# Patient Record
Sex: Female | Born: 1935 | Race: White | Hispanic: No | Marital: Single | Smoking: Never smoker
Health system: Southern US, Community
[De-identification: ages and names within clinical notes are randomized; demographics above are authoritative.]

## PROBLEM LIST (undated history)

## (undated) DIAGNOSIS — I878 Other specified disorders of veins: Secondary | ICD-10-CM

## (undated) DIAGNOSIS — Z86711 Personal history of pulmonary embolism: Secondary | ICD-10-CM

## (undated) DIAGNOSIS — G20 Parkinson's disease: Secondary | ICD-10-CM

## (undated) DIAGNOSIS — G20A1 Parkinson's disease without dyskinesia, without mention of fluctuations: Secondary | ICD-10-CM

## (undated) DIAGNOSIS — I509 Heart failure, unspecified: Secondary | ICD-10-CM

---

## 1998-08-15 ENCOUNTER — Other Ambulatory Visit: Admission: RE | Admit: 1998-08-15 | Discharge: 1998-08-15 | Payer: Self-pay | Admitting: Gynecology

## 2000-12-01 ENCOUNTER — Encounter: Admission: RE | Admit: 2000-12-01 | Discharge: 2000-12-01 | Payer: Self-pay | Admitting: *Deleted

## 2000-12-01 ENCOUNTER — Encounter: Payer: Self-pay | Admitting: *Deleted

## 2000-12-02 ENCOUNTER — Encounter: Payer: Self-pay | Admitting: *Deleted

## 2000-12-02 ENCOUNTER — Encounter: Admission: RE | Admit: 2000-12-02 | Discharge: 2000-12-02 | Payer: Self-pay | Admitting: *Deleted

## 2003-09-11 ENCOUNTER — Emergency Department (HOSPITAL_COMMUNITY): Admission: EM | Admit: 2003-09-11 | Discharge: 2003-09-11 | Payer: Self-pay | Admitting: Emergency Medicine

## 2003-09-26 ENCOUNTER — Emergency Department (HOSPITAL_COMMUNITY): Admission: EM | Admit: 2003-09-26 | Discharge: 2003-09-27 | Payer: Self-pay | Admitting: Emergency Medicine

## 2003-09-27 ENCOUNTER — Ambulatory Visit (HOSPITAL_COMMUNITY): Admission: RE | Admit: 2003-09-27 | Discharge: 2003-09-27 | Payer: Self-pay | Admitting: Emergency Medicine

## 2003-10-02 ENCOUNTER — Emergency Department (HOSPITAL_COMMUNITY): Admission: EM | Admit: 2003-10-02 | Discharge: 2003-10-02 | Payer: Self-pay | Admitting: Emergency Medicine

## 2003-10-21 ENCOUNTER — Emergency Department (HOSPITAL_COMMUNITY): Admission: EM | Admit: 2003-10-21 | Discharge: 2003-10-21 | Payer: Self-pay | Admitting: Emergency Medicine

## 2004-06-29 ENCOUNTER — Emergency Department (HOSPITAL_COMMUNITY): Admission: EM | Admit: 2004-06-29 | Discharge: 2004-06-29 | Payer: Self-pay | Admitting: Emergency Medicine

## 2004-07-17 ENCOUNTER — Emergency Department (HOSPITAL_COMMUNITY): Admission: EM | Admit: 2004-07-17 | Discharge: 2004-07-17 | Payer: Self-pay | Admitting: Emergency Medicine

## 2004-07-28 ENCOUNTER — Encounter: Admission: RE | Admit: 2004-07-28 | Discharge: 2004-07-28 | Payer: Self-pay | Admitting: *Deleted

## 2004-11-18 ENCOUNTER — Emergency Department (HOSPITAL_COMMUNITY): Admission: EM | Admit: 2004-11-18 | Discharge: 2004-11-19 | Payer: Self-pay | Admitting: Emergency Medicine

## 2004-12-21 ENCOUNTER — Emergency Department (HOSPITAL_COMMUNITY): Admission: EM | Admit: 2004-12-21 | Discharge: 2004-12-21 | Payer: Self-pay | Admitting: Emergency Medicine

## 2005-01-16 ENCOUNTER — Emergency Department (HOSPITAL_COMMUNITY): Admission: EM | Admit: 2005-01-16 | Discharge: 2005-01-16 | Payer: Self-pay | Admitting: Emergency Medicine

## 2005-02-12 ENCOUNTER — Emergency Department (HOSPITAL_COMMUNITY): Admission: EM | Admit: 2005-02-12 | Discharge: 2005-02-12 | Payer: Self-pay | Admitting: *Deleted

## 2008-11-15 ENCOUNTER — Ambulatory Visit: Payer: Self-pay | Admitting: Internal Medicine

## 2008-11-15 DIAGNOSIS — Z86718 Personal history of other venous thrombosis and embolism: Secondary | ICD-10-CM | POA: Insufficient documentation

## 2008-11-15 DIAGNOSIS — I872 Venous insufficiency (chronic) (peripheral): Secondary | ICD-10-CM | POA: Insufficient documentation

## 2009-01-14 ENCOUNTER — Encounter: Admission: RE | Admit: 2009-01-14 | Discharge: 2009-01-23 | Payer: Self-pay | Admitting: *Deleted

## 2009-01-16 ENCOUNTER — Telehealth: Payer: Self-pay | Admitting: Internal Medicine

## 2009-01-17 ENCOUNTER — Encounter: Payer: Self-pay | Admitting: Internal Medicine

## 2009-01-20 ENCOUNTER — Observation Stay (HOSPITAL_COMMUNITY): Admission: EM | Admit: 2009-01-20 | Discharge: 2009-01-21 | Payer: Self-pay | Admitting: Emergency Medicine

## 2009-01-21 ENCOUNTER — Telehealth: Payer: Self-pay | Admitting: Internal Medicine

## 2009-01-23 ENCOUNTER — Telehealth (INDEPENDENT_AMBULATORY_CARE_PROVIDER_SITE_OTHER): Payer: Self-pay | Admitting: *Deleted

## 2009-01-30 ENCOUNTER — Telehealth (INDEPENDENT_AMBULATORY_CARE_PROVIDER_SITE_OTHER): Payer: Self-pay | Admitting: *Deleted

## 2009-02-08 ENCOUNTER — Ambulatory Visit: Payer: Self-pay | Admitting: Cardiology

## 2009-02-08 LAB — CONVERTED CEMR LAB: POC INR: 3.9

## 2009-02-15 ENCOUNTER — Ambulatory Visit: Payer: Self-pay | Admitting: Cardiovascular Disease

## 2009-02-22 ENCOUNTER — Ambulatory Visit: Payer: Self-pay | Admitting: Internal Medicine

## 2009-03-01 ENCOUNTER — Ambulatory Visit: Payer: Self-pay | Admitting: Internal Medicine

## 2009-03-01 LAB — CONVERTED CEMR LAB: POC INR: 2.7

## 2009-03-15 ENCOUNTER — Ambulatory Visit: Payer: Self-pay | Admitting: Internal Medicine

## 2009-04-11 ENCOUNTER — Telehealth: Payer: Self-pay | Admitting: Internal Medicine

## 2009-04-12 ENCOUNTER — Telehealth (INDEPENDENT_AMBULATORY_CARE_PROVIDER_SITE_OTHER): Payer: Self-pay | Admitting: *Deleted

## 2009-04-12 ENCOUNTER — Encounter: Payer: Self-pay | Admitting: Internal Medicine

## 2009-04-12 LAB — CONVERTED CEMR LAB: Prothrombin Time: 24.3 s — ABNORMAL HIGH (ref 11.6–15.2)

## 2009-04-16 ENCOUNTER — Telehealth: Payer: Self-pay | Admitting: Internal Medicine

## 2009-05-08 ENCOUNTER — Telehealth: Payer: Self-pay | Admitting: Internal Medicine

## 2009-05-08 ENCOUNTER — Encounter: Payer: Self-pay | Admitting: Cardiology

## 2009-05-09 ENCOUNTER — Encounter: Payer: Self-pay | Admitting: Cardiology

## 2009-05-10 ENCOUNTER — Encounter: Payer: Self-pay | Admitting: Cardiology

## 2009-05-10 LAB — CONVERTED CEMR LAB: POC INR: 2.32

## 2009-05-21 ENCOUNTER — Telehealth (INDEPENDENT_AMBULATORY_CARE_PROVIDER_SITE_OTHER): Payer: Self-pay | Admitting: *Deleted

## 2009-05-29 ENCOUNTER — Encounter: Payer: Self-pay | Admitting: Internal Medicine

## 2009-06-07 ENCOUNTER — Telehealth (INDEPENDENT_AMBULATORY_CARE_PROVIDER_SITE_OTHER): Payer: Self-pay | Admitting: *Deleted

## 2009-06-10 ENCOUNTER — Ambulatory Visit: Payer: Self-pay | Admitting: Cardiology

## 2009-06-10 ENCOUNTER — Telehealth (INDEPENDENT_AMBULATORY_CARE_PROVIDER_SITE_OTHER): Payer: Self-pay | Admitting: Cardiology

## 2009-06-10 LAB — CONVERTED CEMR LAB: POC INR: 3

## 2009-07-08 ENCOUNTER — Ambulatory Visit: Payer: Self-pay | Admitting: Cardiology

## 2009-07-08 LAB — CONVERTED CEMR LAB: POC INR: 2.7

## 2009-07-22 ENCOUNTER — Telehealth: Payer: Self-pay | Admitting: Internal Medicine

## 2009-08-02 ENCOUNTER — Ambulatory Visit: Payer: Self-pay | Admitting: Internal Medicine

## 2009-08-05 ENCOUNTER — Encounter (INDEPENDENT_AMBULATORY_CARE_PROVIDER_SITE_OTHER): Payer: Self-pay | Admitting: Cardiology

## 2009-08-05 ENCOUNTER — Ambulatory Visit: Payer: Self-pay | Admitting: Cardiology

## 2009-08-05 LAB — CONVERTED CEMR LAB: POC INR: 2.1

## 2009-08-12 ENCOUNTER — Ambulatory Visit: Payer: Self-pay | Admitting: Diagnostic Radiology

## 2009-08-12 ENCOUNTER — Emergency Department (HOSPITAL_BASED_OUTPATIENT_CLINIC_OR_DEPARTMENT_OTHER): Admission: EM | Admit: 2009-08-12 | Discharge: 2009-08-12 | Payer: Self-pay | Admitting: Emergency Medicine

## 2009-08-14 ENCOUNTER — Telehealth (INDEPENDENT_AMBULATORY_CARE_PROVIDER_SITE_OTHER): Payer: Self-pay | Admitting: *Deleted

## 2009-08-16 DIAGNOSIS — R609 Edema, unspecified: Secondary | ICD-10-CM

## 2009-08-16 DIAGNOSIS — I5032 Chronic diastolic (congestive) heart failure: Secondary | ICD-10-CM

## 2009-08-16 DIAGNOSIS — R531 Weakness: Secondary | ICD-10-CM | POA: Insufficient documentation

## 2009-08-16 HISTORY — DX: Chronic diastolic (congestive) heart failure: I50.32

## 2009-09-16 ENCOUNTER — Ambulatory Visit: Payer: Self-pay | Admitting: Cardiovascular Disease

## 2009-09-16 LAB — CONVERTED CEMR LAB: POC INR: 2.1

## 2009-10-14 ENCOUNTER — Ambulatory Visit: Payer: Self-pay | Admitting: Cardiovascular Disease

## 2009-11-15 ENCOUNTER — Ambulatory Visit: Payer: Self-pay | Admitting: Cardiology

## 2009-11-15 LAB — CONVERTED CEMR LAB: POC INR: 1.5

## 2009-11-20 ENCOUNTER — Ambulatory Visit: Payer: Self-pay | Admitting: Cardiology

## 2009-11-20 LAB — CONVERTED CEMR LAB: POC INR: 2.3

## 2009-12-05 ENCOUNTER — Ambulatory Visit: Payer: Self-pay | Admitting: Cardiology

## 2009-12-26 ENCOUNTER — Ambulatory Visit: Payer: Self-pay | Admitting: Internal Medicine

## 2009-12-26 LAB — CONVERTED CEMR LAB: POC INR: 1.8

## 2010-01-10 ENCOUNTER — Ambulatory Visit: Payer: Self-pay | Admitting: Cardiology

## 2010-01-10 LAB — CONVERTED CEMR LAB: POC INR: 1.5

## 2010-01-21 ENCOUNTER — Ambulatory Visit: Payer: Self-pay | Admitting: Cardiology

## 2010-01-21 LAB — CONVERTED CEMR LAB: POC INR: 1.9

## 2010-02-04 ENCOUNTER — Ambulatory Visit: Payer: Self-pay | Admitting: Cardiology

## 2010-02-27 ENCOUNTER — Ambulatory Visit: Payer: Self-pay | Admitting: Internal Medicine

## 2010-03-28 ENCOUNTER — Ambulatory Visit: Payer: Self-pay | Admitting: Cardiology

## 2010-04-17 ENCOUNTER — Ambulatory Visit: Payer: Self-pay | Admitting: Internal Medicine

## 2010-05-08 ENCOUNTER — Ambulatory Visit: Payer: Self-pay | Admitting: Cardiovascular Disease

## 2010-05-22 ENCOUNTER — Ambulatory Visit: Payer: Self-pay | Admitting: Cardiovascular Disease

## 2010-06-05 ENCOUNTER — Ambulatory Visit: Payer: Self-pay | Admitting: Internal Medicine

## 2010-06-05 LAB — CONVERTED CEMR LAB: POC INR: 2.5

## 2010-07-01 ENCOUNTER — Ambulatory Visit: Admission: RE | Admit: 2010-07-01 | Discharge: 2010-07-01 | Payer: Self-pay | Source: Home / Self Care

## 2010-07-01 LAB — CONVERTED CEMR LAB: POC INR: 2.5

## 2010-07-03 ENCOUNTER — Ambulatory Visit
Admission: RE | Admit: 2010-07-03 | Discharge: 2010-07-03 | Payer: Self-pay | Source: Home / Self Care | Attending: Internal Medicine | Admitting: Internal Medicine

## 2010-07-13 ENCOUNTER — Encounter: Payer: Self-pay | Admitting: *Deleted

## 2010-07-22 NOTE — Assessment & Plan Note (Signed)
Summary: FOLLOW UP/KLW   Primary Provider/Referring Provider:  None  CC:  follow up visit-discuss leg pain and breathing. Marland Kitchen  History of Present Illness:  03/01/09- Hx Pulmonary embolism/ coumadin, PVD,  She asks advice about diet an coumadin, saying advice from [facility] and [facility] varied about eating greens. She went to ER for leg edema. Can't wear elastic hose- can't get them on. Legs are comfortable despite swelling and redness. Denies chest pain, palpitation, cough, wheeze, phlegm. No problems but she asks screening mamogram- none in long time. Discussed need to establish primary care physician.  August 02, 2009- Pulmonary embolism/ coumadin, PVD,  She asked to speak, and began a long narrative of her health hx in recent years. She reminds me tentative dx Parkinson's made in past. She expressed concern that her mental status had been questioned in past. She asks to reschedule mamogram. She tells me that she has managed to upset various medical providers, but isn't specific about what has happened. Was living in a [employer], without her own transportation..She has asked to get her coags drawn wherever she could get to, when she couldn't reach the [facility].      Current Medications (verified): 1)  Coumadin 2.5 Mg Tabs (Warfarin Sodium) .... Take As Directed By Coumadin Clinic. 2)  Acetaminophen 500 Mg Tabs (Acetaminophen) .... As Needed  Allergies (verified): 1)  ! Vioxx 2)  ! * Ivp Dye 3)  ! Vancomycin  Past History:  Past Surgical History: Last updated: 11/15/2008 None reported  Family History: Last updated: 08/02/2009 Question of clotting disorder Father- died prostate cancer, surgical complication Brother- died prostate cancer Mother- alive  Social History: Last updated: 11/15/2008 Patient never smoked.  No alcohol Unmarried No children retired Occupational psychologist Lives alone or with friends  Risk Factors: Smoking Status: never  (11/15/2008)  Past Medical History: Pulmonary embolism 2006- [facility]  Family History: Question of clotting disorder Father- died prostate cancer, surgical complication Brother- died prostate cancer Mother- alive  Review of Systems      See HPI       The patient complains of peripheral edema.  The patient denies anorexia, fever, weight loss, weight gain, vision loss, decreased hearing, hoarseness, chest pain, syncope, dyspnea on exertion, prolonged cough, headaches, hemoptysis, and severe indigestion/heartburn.         Variable leg edema, sometimes sevcere, without leg cramps.  Vital Signs:  Patient profile:   76 year old female Height:      63.5 inches O2 Sat:      97 % on Room air Pulse rate:   74 / minute BP sitting:   116 / 70  (left arm) Cuff size:   regular  Vitals Entered By: Reynaldo Minium CMA (August 02, 2009 2:21 PM)  O2 Flow:  Room air  Physical Exam  Additional Exam:  General: A/Ox3; pleasant and cooperative, NAD, weak elderly woman, oriented and responsive,  SKIN: no rash, lesions NODES: no lymphadenopathy HEENT: [place]/AT, EOM- WNL, Conjuctivae- clear, PERRLA, TM-WNL, Nose- clear, Throat- clear and wnl, broken cap upper front NECK: Supple w/ fair ROM, JVD- none, normal carotid impulses w/o bruits Thyroid- . Neck is kept flexed. CHEST: few minor crackles, unlabored. No discrete mass, retraction, discharge or asymmetry. HEART: RRR, no m/g/r heard ABDOMEN: Soft and nl; nml bowel sounds; no organomegaly or masses noted ZOX:WRUE, nl pulses,3+ bilateral edema to knees, shiney with stasis changes mild erythema, neg Homan's  NEURO: Grossly intact to observation- no tremor  Impression & Recommendations:  Problem # 1:  PULMONARY EMBOLISM, HX OF (ICD-V12.51) She needs a primary care source and I suggested [facility]. She continues to follow at [facility] clinic. There is no sign of recurrent embolism, but with her leg edema, this will remain a concern.  Respiratory symptoms are minimal. Her updated medication list for this problem includes:    Coumadin 2.5 Mg Tabs (Warfarin sodium) .Marland Kitchen... Take as directed by coumadin clinic.  Problem # 2:  UNSPECIFIED VENOUS INSUFFICIENCY (ICD-459.81) Chronic stasis changes with chronic peripheral edema. . We discussed elevation of legs to reduce pressure. She finds it too difficult to put on elastic hose. She tried diuretics in past but has had difficulty with long term follow-up.  Other Orders: Est. Patient Level II ([address]) Primary Care Referral (Primary) Radiology Referral (Radiology)  Patient Instructions: 1)  Please schedule a follow-up appointment as needed. 2)  See [facility] to make contact with [facility] for primary care, and to set up a mamogram.

## 2010-07-22 NOTE — Medication Information (Signed)
Summary: rov  Anticoagulant Therapy  Managed by: Sherri Rad, RN, BSN Referring MD: Jetty Duhamel MD PCP: None Supervising MD: Ladona Ridgel MD, Sharlot Gowda Indication 1: Pulmonary Embolism Indication 2: DVT [place] Site: [address] INR POC 1.5 INR RANGE 2-3  Dietary changes: no    Health status changes: no    Bleeding/hemorrhagic complications: no    Recent/future hospitalizations: no    Any changes in medication regimen? no    Recent/future dental: yes     Details: possible tooth pull on monday  Any missed doses?: no       Is patient compliant with meds? yes       Allergies: 1)  ! Vioxx 2)  ! * Ivp Dye 3)  ! Vancomycin  Anticoagulation Management History:      Positive risk factors for bleeding include an age of 74 years or older.  The bleeding index is 'intermediate risk'.  Negative CHADS2 values include Age > 23 years old.  Her last INR was 2.2.  Anticoagulation responsible provider: Ladona Ridgel MD, Sharlot Gowda.  INR POC: 1.5.  Exp: 01/2011.    Anticoagulation Management Assessment/Plan:      The patient's current anticoagulation dose is Coumadin 2.5 mg tabs: Take as directed by coumadin clinic..  The target INR is 2.0-3.0.  The next INR is due 01/21/2010.  Anticoagulation instructions were given to patient.  Results were reviewed/authorized by Sherri Rad, RN, BSN.  She was notified by Sherri Rad, RN, BSN.         Prior Anticoagulation Instructions: INR 1.8  Take 1 1/2 tablets today then resume same dose of 1 tablet every day except 1 1/2 tablets on Monday, Wednesday and Friday.   Current Anticoagulation Instructions: INR 1.5  Continue current dose of coumadin until after your tooth is pulled. On monday take 2 tablets, then start 1 1/2 tablets every day except 1 tablet on Sunday, Tuesday, and Thursday.

## 2010-07-22 NOTE — Letter (Signed)
Summary: Handout Printed  Printed Handout:  - Coumadin Instructions-w/out Meds 

## 2010-07-22 NOTE — Medication Information (Signed)
Summary: ccr/hm  Anticoagulant Therapy  Managed by: Weston Brass, PharmD Referring MD: Jetty Duhamel MD PCP: None Supervising MD: Juanda Chance MD, Bruce Indication 1: Pulmonary Embolism Indication 2: DVT [place] Site: [address] INR POC 1.9 INR RANGE 2-3  Dietary changes: no    Health status changes: no    Bleeding/hemorrhagic complications: no    Recent/future hospitalizations: no    Any changes in medication regimen? no    Recent/future dental: no  Any missed doses?: no       Is patient compliant with meds? yes       Allergies: 1)  ! Vioxx 2)  ! * Ivp Dye 3)  ! Vancomycin  Anticoagulation Management History:      The patient is taking warfarin and comes in today for a routine follow up visit.  Positive risk factors for bleeding include an age of 74 years or older.  The bleeding index is 'intermediate risk'.  Negative CHADS2 values include Age > 54 years old.  Her last INR was 2.2.  Anticoagulation responsible provider: Juanda Chance MD, Smitty Cords.  INR POC: 1.9.  Exp: 01/2011.    Anticoagulation Management Assessment/Plan:      The patient's current anticoagulation dose is Coumadin 2.5 mg tabs: Take as directed by coumadin clinic..  The target INR is 2.0-3.0.  The next INR is due 02/04/2010.  Anticoagulation instructions were given to patient.  Results were reviewed/authorized by Weston Brass, PharmD.  She was notified by Weston Brass PharmD.         Prior Anticoagulation Instructions: INR 1.5  Continue current dose of coumadin until after your tooth is pulled. On monday take 2 tablets, then start 1 1/2 tablets every day except 1 tablet on Sunday, Tuesday, and Thursday.  Current Anticoagulation Instructions: INR 1.9  Take 1 1/2 tablets today then resume same dose of 1 1/2 tablets every day except 1 tablet on Sunday, Tuesday and Thursday.

## 2010-07-22 NOTE — Medication Information (Signed)
Summary: rov/mw  Anticoagulant Therapy  Managed by: Weston Brass, PharmD Referring MD: Jetty Duhamel MD PCP: None Supervising MD: Juanda Chance MD, Bruce Indication 1: Pulmonary Embolism Indication 2: DVT [place] Site: [address] INR POC 3.1 INR RANGE 2-3  Dietary changes: no    Health status changes: no    Bleeding/hemorrhagic complications: yes       Details: A few nose bleeds, but these stopped right away.    Recent/future hospitalizations: no     Recent/future dental: yes     Details: Appt in late October for a cap  Any missed doses?: no       Is patient compliant with meds? yes       Allergies: 1)  ! Vioxx 2)  ! * Ivp Dye 3)  ! Vancomycin  Anticoagulation Management History:      The patient is taking warfarin and comes in today for a routine follow up visit.  Positive risk factors for bleeding include an age of 74 years or older.  The bleeding index is 'intermediate risk'.  Negative CHADS2 values include Age > 45 years old.  Her last INR was 2.2.  Anticoagulation responsible Elna Radovich: Juanda Chance MD, Smitty Cords.  INR POC: 3.1.  Cuvette Lot#: [id number].  Exp: 04/2011.    Anticoagulation Management Assessment/Plan:      The patient's current anticoagulation dose is Coumadin 2.5 mg tabs: Take as directed by coumadin clinic..  The target INR is 2.0-3.0.  The next INR is due 04/17/2010.  Anticoagulation instructions were given to patient.  Results were reviewed/authorized by Weston Brass, PharmD.  She was notified by Weston Brass, PharmD.         Prior Anticoagulation Instructions: INR 2.9 Continue current dosages, no changes today. Take 1.5 tablets every day except sunday and thursday. Recheck in 4 weeks.  Current Anticoagulation Instructions: INR 3.1  Take 1/2 tablet today.  Then resume normal Coumadin schedule of 1 & 1/2 tablets every day of the week, except 1 tablet on Sunday and Thursday.  Recheck INR in 3 weeks.

## 2010-07-22 NOTE — Medication Information (Signed)
Summary: rov/sp  Anticoagulant Therapy  Managed by: Weston Brass, PharmD Referring MD: Jetty Duhamel MD PCP: None Supervising MD: Riley Kill MD, Maisie Fus Indication 1: Pulmonary Embolism Indication 2: DVT [place] Site: [address] INR POC 2.3 INR RANGE 2-3  Dietary changes: no    Health status changes: no    Bleeding/hemorrhagic complications: no    Recent/future hospitalizations: no    Any changes in medication regimen? yes       Details: finished lovenox on Monday  Recent/future dental: no  Any missed doses?: yes     Details: Took extra 1/2 tablet yesterday  Is patient compliant with meds? yes       Allergies: 1)  ! Vioxx 2)  ! * Ivp Dye 3)  ! Vancomycin  Anticoagulation Management History:      The patient is taking warfarin and comes in today for a routine follow up visit.  Positive risk factors for bleeding include an age of 74 years or older.  The bleeding index is 'intermediate risk'.  Negative CHADS2 values include Age > 64 years old.  Her last INR was 2.2.  Anticoagulation responsible provider: Riley Kill MD, Maisie Fus.  INR POC: 2.3.  Cuvette Lot#: [id number].  Exp: 01/2011.    Anticoagulation Management Assessment/Plan:      The patient's current anticoagulation dose is Coumadin 2.5 mg tabs: Take as directed by coumadin clinic..  The target INR is 2.0-3.0.  The next INR is due 12/04/2009.  Anticoagulation instructions were given to patient.  Results were reviewed/authorized by Weston Brass, PharmD.  She was notified by Weston Brass PharmD.         Prior Anticoagulation Instructions: INR 1.5  Take 2 tablets today and tomorrow then increase dose to 1 tablet every day except 1 1/2 tablets on Monday, Wednesday and Friday.  Take Lovenox 70 mg once daily   Current Anticoagulation Instructions: INR 2.3  Continue same dose of 1 tablet every day except 1 1/2 tablets on Monday, Wednesday and Friday

## 2010-07-22 NOTE — Medication Information (Signed)
Summary: rov/sp  Anticoagulant Therapy  Managed by: Weston Brass, PharmD Referring MD: Jetty Duhamel MD PCP: None Supervising MD: Ladona Ridgel MD,Gregg Indication 1: Pulmonary Embolism Indication 2: DVT [place] Site: [address] INR POC 2.9 INR RANGE 2-3  Dietary changes: no    Health status changes: no    Bleeding/hemorrhagic complications: no    Recent/future hospitalizations: no    Any changes in medication regimen? no    Recent/future dental: no  Any missed doses?: no       Is patient compliant with meds? yes       Allergies: 1)  ! Vioxx 2)  ! * Ivp Dye 3)  ! Vancomycin  Anticoagulation Management History:      The patient is taking warfarin and comes in today for a routine follow up visit.  Positive risk factors for bleeding include an age of 74 years or older.  The bleeding index is 'intermediate risk'.  Negative CHADS2 values include Age > 79 years old.  Her last INR was 2.2.  Anticoagulation responsible provider: Ladona Ridgel MD,Gregg.  INR POC: 2.9.  Cuvette Lot#: [id number].  Exp: 01/2011.    Anticoagulation Management Assessment/Plan:      The patient's current anticoagulation dose is Coumadin 2.5 mg tabs: Take as directed by coumadin clinic..  The target INR is 2.0-3.0.  The next INR is due 03/27/2010.  Anticoagulation instructions were given to patient.  Results were reviewed/authorized by Weston Brass, PharmD.         Prior Anticoagulation Instructions: INR 2.1  Change dose to 1 1/2 tablets every day except 1 tablet on Sunday and Thursday.  Recheck INR in 3 weeks.   Current Anticoagulation Instructions: INR 2.9 Continue current dosages, no changes today. Take 1.5 tablets every day except sunday and thursday. Recheck in 4 weeks.

## 2010-07-22 NOTE — Medication Information (Signed)
Summary: rov/ewj  Anticoagulant Therapy  Managed by: Shelby Dubin, PharmD, BCPS, CPP Referring MD: Jetty Duhamel MD PCP: None Supervising MD: Shirlee Latch MD, Jani Ploeger Indication 1: Pulmonary Embolism Indication 2: DVT [place] Site: [address] INR POC 2.1 INR RANGE 2-3  Dietary changes: no    Health status changes: no    Bleeding/hemorrhagic complications: no    Recent/future hospitalizations: no    Any changes in medication regimen? no    Recent/future dental: no  Any missed doses?: no       Is patient compliant with meds? yes       Current Medications (verified): 1)  Coumadin 2.5 Mg Tabs (Warfarin Sodium) .... Take As Directed By Coumadin Clinic. 2)  Acetaminophen 500 Mg Tabs (Acetaminophen) .... As Needed  Allergies (verified): 1)  ! Vioxx 2)  ! * Ivp Dye 3)  ! Vancomycin  Anticoagulation Management History:      The patient is taking warfarin and comes in today for a routine follow up visit.  Positive risk factors for bleeding include an age of 13 years or older.  The bleeding index is 'intermediate risk'.  Negative CHADS2 values include Age > 35 years old.  Her last INR was 2.2.  Anticoagulation responsible provider: Shirlee Latch MD, Calysta Craigo.  INR POC: 2.1.  Cuvette Lot#: [id number].  Exp: 09/2010.    Anticoagulation Management Assessment/Plan:      The patient's current anticoagulation dose is Coumadin 2.5 mg tabs: Take as directed by coumadin clinic..  The target INR is 2.0-3.0.  The next INR is due 09/02/2009.  Anticoagulation instructions were given to patient.  Results were reviewed/authorized by Shelby Dubin, PharmD, BCPS, CPP.  She was notified by Shelby Dubin PharmD, BCPS, CPP.         Prior Anticoagulation Instructions: INR 2.7  Continue on same dosage 1 tablet daily except 1.5 tablets on Mondays and Fridays.  Recheck in 4 weeks.    Current Anticoagulation Instructions: INR 2.1  Continue 1 tab daily except 1.5 tabs each Monday and Friday.    Recheck in 4 weeks.    Prescriptions: COUMADIN 2.5 MG TABS (WARFARIN SODIUM) Take as directed by coumadin clinic.  #45 x 1   Entered by:   Shelby Dubin PharmD, BCPS, CPP   Authorized by:   Marca Ancona, MD   Signed by:   Shelby Dubin PharmD, BCPS, CPP on 08/05/2009   Method used:   Electronically to        [facility]  [phone]* (retail)       [address]       [place], [place]  [address]       Ph: [phone] or [phone]       Fax: [phone]   RxID:   [id number]

## 2010-07-22 NOTE — Medication Information (Signed)
Summary: rov/nb  Anticoagulant Therapy  Managed by: Lyna Poser, PharmD Referring MD: Jetty Duhamel MD PCP: None Supervising MD: Eden Emms MD, Theron Arista Indication 1: Pulmonary Embolism Indication 2: DVT [place] Site: [address] INR POC 3 INR RANGE 2-3  Dietary changes: no    Health status changes: no    Bleeding/hemorrhagic complications: no    Recent/future hospitalizations: no    Any changes in medication regimen? no    Recent/future dental: no  Any missed doses?: no       Is patient compliant with meds? yes      Comments: patient request 2 week check since it was high last time.   Allergies: 1)  ! Vioxx 2)  ! * Ivp Dye 3)  ! Vancomycin  Anticoagulation Management History:      The patient is taking warfarin and comes in today for a routine follow up visit.  Positive risk factors for bleeding include an age of 74 years or older.  The bleeding index is 'intermediate risk'.  Negative CHADS2 values include Age > 74 years old.  Her last INR was 2.2.  Anticoagulation responsible provider: Eden Emms MD, Theron Arista.  INR POC: 3.  Cuvette Lot#: [id number].  Exp: 04/2011.    Anticoagulation Management Assessment/Plan:      The patient's current anticoagulation dose is Coumadin 2.5 mg tabs: Take as directed by coumadin clinic..  The target INR is 2.0-3.0.  The next INR is due 06/05/2010.  Anticoagulation instructions were given to patient.  Results were reviewed/authorized by Lyna Poser, PharmD.         Prior Anticoagulation Instructions: INR 3.3 Skip today's dose then take 1 tablet everyday except 1.5 tablet on Monday, Wednesday, and Friday Recheck INR in 2 weeks  Current Anticoagulation Instructions: INR 3  Continue taking 1.5 tablets on monday, wednesday, and friday. And 1 tablet all other days. Recheck in 2 weeks.

## 2010-07-22 NOTE — Medication Information (Signed)
Summary: rov coumadin -lmc  Anticoagulant Therapy  Managed by: Weston Brass, PharmD Referring MD: Jetty Duhamel MD PCP: None Supervising MD: Clifton James MD, Cristal Deer Indication 1: Pulmonary Embolism Indication 2: DVT [place] Site: [address] INR POC 3.3 INR RANGE 2-3  Dietary changes: no    Health status changes: no    Bleeding/hemorrhagic complications: no    Recent/future hospitalizations: no    Any changes in medication regimen? no    Recent/future dental: no  Any missed doses?: no       Is patient compliant with meds? yes       Allergies: 1)  ! Vioxx 2)  ! * Ivp Dye 3)  ! Vancomycin  Anticoagulation Management History:      The patient is taking warfarin and comes in today for a routine follow up visit.  Positive risk factors for bleeding include an age of 74 years or older.  The bleeding index is 'intermediate risk'.  Negative CHADS2 values include Age > 74 years old.  Her last INR was 2.2.  Anticoagulation responsible provider: Clifton James MD, Cristal Deer.  INR POC: 3.3.  Cuvette Lot#: [id number].  Exp: 04/2011.    Anticoagulation Management Assessment/Plan:      The patient's current anticoagulation dose is Coumadin 2.5 mg tabs: Take as directed by coumadin clinic..  The target INR is 2.0-3.0.  The next INR is due 05/22/2010.  Anticoagulation instructions were given to patient.  Results were reviewed/authorized by Weston Brass, PharmD.  She was notified by Hoy Register, PharmD Candidate.         Prior Anticoagulation Instructions: INR 3.3  No coumadin today Thur 10/27 then  Coumadin 1 tab = 2.5mg  on Tu, Thu, Sun 1 and 1/2 tab = 3.75mg  on Mon, Wed, Fri, Sat  Current Anticoagulation Instructions: INR 3.3 Skip today's dose then take 1 tablet everyday except 1.5 tablet on Monday, Wednesday, and Friday Recheck INR in 2 weeks Prescriptions: COUMADIN 2.5 MG TABS (WARFARIN SODIUM) Take as directed by coumadin clinic.  #45 x 2   Entered by:   Bethena Midget, RN, BSN  Authorized by:   Verne Carrow, MD   Signed by:   Bethena Midget, RN, BSN on 05/08/2010   Method used:   Electronically to        [facility]  [phone]* (retail)       [address]       [place], [place]  [address]       Ph: [phone] or [phone]       Fax: [phone]   RxID:   [id number]

## 2010-07-22 NOTE — Miscellaneous (Signed)
  Clinical Lists Changes  Medications: Rx of COUMADIN 2.5 MG TABS (WARFARIN SODIUM) Take as directed by coumadin clinic.;  #45 x 1;  Signed;  Entered by: Eda Keys;  Authorized by: Verne Carrow, MD;  Method used: Electronically to [facility]  [id number]*, [address] [place], [place], [place]  [address], Ph: [phone] or [phone], Fax: [phone]    Prescriptions: COUMADIN 2.5 MG TABS (WARFARIN SODIUM) Take as directed by coumadin clinic.  #45 x 1   Entered by:   Eda Keys   Authorized by:   Verne Carrow, MD   Signed by:   Eda Keys on 09/16/2009   Method used:   Electronically to        [facility]  [phone]* (retail)       [address]       [place], [place]  [address]       Ph: [phone] or [phone]       Fax: [phone]   RxID:   [id number]

## 2010-07-22 NOTE — Progress Notes (Signed)
Summary: Coumadin dosage infor for continuity of care  Phone Note From Other Clinic   Caller: Lula Olszewski, NP Call For: Coumadin Clinic Summary of Call: NP called and states pt is currently at their facility, [facility] and will be followed there temporarily.  Requested current coumadin dosage info, and last couple INR results.  Will make pt inactive and they will call to resume f/u at our clinic after discharge.   Initial call taken by: Cloyde Reams RN,  August 14, 2009 11:21 AM

## 2010-07-22 NOTE — Medication Information (Signed)
Summary: rov/sp  Anticoagulant Therapy  Managed by: Leota Sauers, PharmD, BCPS, CPP Referring MD: Jetty Duhamel MD PCP: None Supervising MD: Raneisha Bress MD, Mychele Seyller Indication 1: Pulmonary Embolism Indication 2: DVT [place] Site: [address] INR POC 3.3 INR RANGE 2-3  Dietary changes: no    Health status changes: no    Bleeding/hemorrhagic complications: no    Recent/future hospitalizations: no    Any changes in medication regimen? no    Recent/future dental: no  Any missed doses?: no       Is patient compliant with meds? yes       Current Medications (verified): 1)  Coumadin 2.5 Mg Tabs (Warfarin Sodium) .... Take As Directed By Coumadin Clinic. 2)  Acetaminophen 500 Mg Tabs (Acetaminophen) .... As Needed 3)  Lovenox 80 Mg/0.63ml Soln (Enoxaparin Sodium) .... Inject 70 Mg  Subcutaneously Into Abdomen Daily  Allergies: 1)  ! Vioxx 2)  ! * Ivp Dye 3)  ! Vancomycin  Anticoagulation Management History:      The patient is taking warfarin and comes in today for a routine follow up visit.  Positive risk factors for bleeding include an age of 43 years or older.  The bleeding index is 'intermediate risk'.  Negative CHADS2 values include Age > 44 years old.  Her last INR was 2.2.  Anticoagulation responsible provider: Jonpaul Lumm MD, Reuel Boom.  INR POC: 3.3.  Cuvette Lot#: [id number].  Exp: 04/2011.    Anticoagulation Management Assessment/Plan:      The patient's current anticoagulation dose is Coumadin 2.5 mg tabs: Take as directed by coumadin clinic..  The target INR is 2.0-3.0.  The next INR is due 05/08/2010.  Anticoagulation instructions were given to patient.  Results were reviewed/authorized by Leota Sauers, PharmD, BCPS, CPP.         Prior Anticoagulation Instructions: INR 3.1  Take 1/2 tablet today.  Then resume normal Coumadin schedule of 1 & 1/2 tablets every day of the week, except 1 tablet on Sunday and Thursday.  Recheck INR in 3 weeks.     Current  Anticoagulation Instructions: INR 3.3  No coumadin today Thur 10/27 then  Coumadin 1 tab = 2.5mg  on Tu, Thu, Sun 1 and 1/2 tab = 3.75mg  on Mon, Wed, Fri, Sat

## 2010-07-22 NOTE — Progress Notes (Signed)
Summary: req to see cy next mon/ fu     Phone Note Call from Patient   Caller: Patient Call For: young Summary of Call: pt wants to see dr young next monday (in the afternoon). pt # [id number] x [phone] Initial call taken by: Tivis Ringer, CNA,  July 22, 2009 3:07 PM  Follow-up for Phone Call        called, spoke with pt.  Pt states she was supposed to see CY a month ago but was unable to do so.  Does not want to wait another month to see him.  Requesting an appt Monday.  states it has to be after 1:00 any day.  Will forward to [facility] advise. Thanks! Follow-up by: Gweneth Dimitri RN,  July 22, 2009 3:14 PM  Additional Follow-up for Phone Call Additional follow up Details #1::        Please let pt know that the only times open are on Thursday. No openings on Monday. Reynaldo Minium CMA  July 22, 2009 4:37 PM     Additional Follow-up for Phone Call Additional follow up Details #2::    no answer by pt at number given  will try back later Randell Loop CMA  July 22, 2009 4:48 PM   [facility]  Aundra Millet Reynolds LPN  July 23, 2009 10:27 AM    returned phone call.  Valinda Hoar  July 23, 2009 12:47 PM  Florentina Addison, you documented in here that pt could be seen next week on Thurs.  There are no appt slots avail other than RN approval.  Please advise.  Aundra Millet Reynolds LPN  July 23, 2009 3:22 PM   Additional Follow-up for Phone Call Additional follow up Details #3:: Details for Additional Follow-up Action Taken: Please call pt and see if she can be here Thursday2-3-11 at 245pm; slot is held for her. Reynaldo Minium CMA  July 24, 2009 8:37 AM   pt advised of appt. will call to cancel if cant make it. Carron Curie CMA  July 24, 2009 9:15 AM

## 2010-07-22 NOTE — Medication Information (Signed)
Summary: rov/eac  Anticoagulant Therapy  Managed by: Weston Brass, PharmD Referring MD: Jetty Duhamel MD PCP: None Supervising MD: Excell Seltzer MD, Casimiro Needle Indication 1: Pulmonary Embolism Indication 2: DVT [place] Site: [address] INR POC 1.9 INR RANGE 2-3  Dietary changes: no    Health status changes: no    Bleeding/hemorrhagic complications: no    Recent/future hospitalizations: no    Any changes in medication regimen? no    Recent/future dental: no  Any missed doses?: no       Is patient compliant with meds? yes       Allergies: 1)  ! Vioxx 2)  ! * Ivp Dye 3)  ! Vancomycin  Anticoagulation Management History:      The patient is taking warfarin and comes in today for a routine follow up visit.  Positive risk factors for bleeding include an age of 74 years or older.  The bleeding index is 'intermediate risk'.  Negative CHADS2 values include Age > 74 years old.  Her last INR was 2.2.  Anticoagulation responsible provider: Excell Seltzer MD, Casimiro Needle.  INR POC: 1.9.  Cuvette Lot#: [id number].  Exp: 10/2010.    Anticoagulation Management Assessment/Plan:      The patient's current anticoagulation dose is Coumadin 2.5 mg tabs: Take as directed by coumadin clinic..  The target INR is 2.0-3.0.  The next INR is due 11/11/2009.  Anticoagulation instructions were given to patient.  Results were reviewed/authorized by Weston Brass, PharmD.  She was notified by Weston Brass PharmD.         Prior Anticoagulation Instructions: INR 2.1  Take 2 tablets today.  Then return to normal dosing schedule of 1.5 tablets on Monday and Friday and 1 tablet all other days.  Return to clinic in 4 weeks.  May consider increasing dose slightly at next visit, if remains on low end of target.  Current Anticoagulation Instructions: INR 1.9  Take 2 tablets today then resume same dose of 1 tablet every day except 1 1/2 tablets on Monday and Friday

## 2010-07-22 NOTE — Medication Information (Signed)
Summary: rov/sp  Anticoagulant Therapy  Managed by: Weston Brass, PharmD Referring MD: Jetty Duhamel MD PCP: None Supervising MD: Myrtis Ser MD, Tinnie Gens Indication 1: Pulmonary Embolism Indication 2: DVT [place] Site: [address] INR POC 1.5 INR RANGE 2-3  Dietary changes: no    Health status changes: no    Bleeding/hemorrhagic complications: no    Recent/future hospitalizations: no    Any changes in medication regimen? no    Recent/future dental: no  Any missed doses?: yes     Details: one dose about a week ago   Comments: Pt concerned about INR being low and having another clot.  Her previous clots were >5 years ago.  I discussed with pt that there was no immediate need to bridge her with Lovenox given that her clots occurred several years ago but she was adamit about doing Lovenox.  I discussed with Dr. Myrtis Ser and he agreed that Lovenox was not necessary but pt refused to not get a prescription for Lovenox.   Current Medications (verified): 1)  Coumadin 2.5 Mg Tabs (Warfarin Sodium) .... Take As Directed By Coumadin Clinic. 2)  Acetaminophen 500 Mg Tabs (Acetaminophen) .... As Needed 3)  Lovenox 80 Mg/0.80ml Soln (Enoxaparin Sodium) .... Inject 70 Mg  Subcutaneously Into Abdomen Daily  Allergies (verified): 1)  ! Vioxx 2)  ! * Ivp Dye 3)  ! Vancomycin  Anticoagulation Management History:      The patient is taking warfarin and comes in today for a routine follow up visit.  Positive risk factors for bleeding include an age of 71 years or older.  The bleeding index is 'intermediate risk'.  Negative CHADS2 values include Age > 69 years old.  Her last INR was 2.2.  Anticoagulation responsible provider: Myrtis Ser MD, Tinnie Gens.  INR POC: 1.5.  Cuvette Lot#: [id number].  Exp: 01/2011.    Anticoagulation Management Assessment/Plan:      The patient's current anticoagulation dose is Coumadin 2.5 mg tabs: Take as directed by coumadin clinic..  The target INR is 2.0-3.0.  The next INR is due  11/20/2009.  Anticoagulation instructions were given to patient.  Results were reviewed/authorized by Weston Brass, PharmD.  She was notified by Weston Brass PharmD.         Prior Anticoagulation Instructions: INR 1.9  Take 2 tablets today then resume same dose of 1 tablet every day except 1 1/2 tablets on Monday and Friday   Current Anticoagulation Instructions: INR 1.5  Take 2 tablets today and tomorrow then increase dose to 1 tablet every day except 1 1/2 tablets on Monday, Wednesday and Friday.  Take Lovenox 70 mg once daily  Prescriptions: LOVENOX 80 MG/0.8ML SOLN (ENOXAPARIN SODIUM) Inject 70 mg  subcutaneously into abdomen daily  #5 x 0   Entered by:   Weston Brass PharmD   Authorized by:   Talitha Givens, MD, [facility]   Signed by:   Weston Brass PharmD on 11/15/2009   Method used:   Electronically to        [facility]  [phone]* (retail)       [address]       [place], [place]  [address]       Ph: [phone] or [phone]       Fax: [phone]   RxID:   [id number]

## 2010-07-22 NOTE — Medication Information (Signed)
Summary: rov/sp  Anticoagulant Therapy  Managed by: Weston Brass, PharmD Referring MD: Jetty Duhamel MD PCP: None Supervising MD: Myrtis Ser MD, Tinnie Gens Indication 1: Pulmonary Embolism Indication 2: DVT [place] Site: [address] INR POC 2.2 INR RANGE 2-3  Dietary changes: no    Health status changes: no    Bleeding/hemorrhagic complications: no    Recent/future hospitalizations: no    Any changes in medication regimen? no    Recent/future dental: no  Any missed doses?: no       Is patient compliant with meds? yes       Allergies: 1)  ! Vioxx 2)  ! * Ivp Dye 3)  ! Vancomycin  Anticoagulation Management History:      The patient is taking warfarin and comes in today for a routine follow up visit.  Positive risk factors for bleeding include an age of 74 years or older.  The bleeding index is 'intermediate risk'.  Negative CHADS2 values include Age > 53 years old.  Her last INR was 2.2.  Anticoagulation responsible provider: Myrtis Ser MD, Tinnie Gens.  INR POC: 2.2.  Cuvette Lot#: [id number].  Exp: 01/2011.    Anticoagulation Management Assessment/Plan:      The patient's current anticoagulation dose is Coumadin 2.5 mg tabs: Take as directed by coumadin clinic..  The target INR is 2.0-3.0.  The next INR is due 12/26/2009.  Anticoagulation instructions were given to patient.  Results were reviewed/authorized by Weston Brass, PharmD.  She was notified by Weston Brass PharmD.         Prior Anticoagulation Instructions: INR 2.3  Continue same dose of 1 tablet every day except 1 1/2 tablets on Monday, Wednesday and Friday  Current Anticoagulation Instructions: INR 2.2  Continue same dose of 1 tablet every day except 1 1/2 tablets on Monday, Wednesday and Friday.

## 2010-07-22 NOTE — Medication Information (Signed)
Summary: rov/ewj  Anticoagulant Therapy  Managed by: Eda Keys, PharmD Referring MD: Jetty Duhamel MD PCP: None Supervising MD: Clifton James MD, Cristal Deer Indication 1: Pulmonary Embolism Indication 2: DVT  Site: [address] INR POC 2.1 INR RANGE 2-3  Dietary changes: no    Health status changes: no    Bleeding/hemorrhagic complications: no    Recent/future hospitalizations: no    Any changes in medication regimen? no    Recent/future dental: no  Any missed doses?: no       Is patient compliant with meds? yes       Allergies: 1)  ! Vioxx 2)  ! * Ivp Dye 3)  ! Vancomycin  Anticoagulation Management History:      The patient is taking warfarin and comes in today for a routine follow up visit.  Positive risk factors for bleeding include an age of 74 years or older.  The bleeding index is 'intermediate risk'.  Negative CHADS2 values include Age > 69 years old.  Her last INR was 2.2.  Anticoagulation responsible provider: Clifton James MD, Cristal Deer.  INR POC: 2.1.  Cuvette Lot#: [id number].  Exp: 10/2010.    Anticoagulation Management Assessment/Plan:      The patient's current anticoagulation dose is Coumadin 2.5 mg tabs: Take as directed by coumadin clinic..  The target INR is 2.0-3.0.  The next INR is due 10/14/2009.  Anticoagulation instructions were given to patient.  Results were reviewed/authorized by Eda Keys, PharmD.  She was notified by Eda Keys.         Prior Anticoagulation Instructions: INR 2.1  Continue 1 tab daily except 1.5 tabs each Monday and Friday.    Recheck in 4 weeks.   Current Anticoagulation Instructions: INR 2.1  Take 2 tablets today.  Then return to normal dosing schedule of 1.5 tablets on Monday and Friday and 1 tablet all other days.  Return to clinic in 4 weeks.  May consider increasing dose slightly at next visit, if remains on low end of target.

## 2010-07-22 NOTE — Medication Information (Signed)
Summary: rov.mlw  Anticoagulant Therapy  Managed by: Cloyde Reams, RN, BSN Referring MD: Jetty Duhamel MD PCP: None Supervising MD: Antoine Poche MD, Fayrene Fearing Indication 1: Pulmonary Embolism Indication 2: DVT [place] Site: [address] INR POC 2.7 INR RANGE 2-3  Dietary changes: no    Health status changes: no    Bleeding/hemorrhagic complications: no    Recent/future hospitalizations: no    Any changes in medication regimen? no    Recent/future dental: no  Any missed doses?: no       Is patient compliant with meds? yes       Allergies: 1)  ! Vioxx 2)  ! * Ivp Dye 3)  ! Vancomycin  Anticoagulation Management History:      The patient is taking warfarin and comes in today for a routine follow up visit.  Positive risk factors for bleeding include an age of 14 years or older.  The bleeding index is 'intermediate risk'.  Negative CHADS2 values include Age > 19 years old.  Her last INR was 2.2.  Anticoagulation responsible provider: Antoine Poche MD, Fayrene Fearing.  INR POC: 2.7.  Cuvette Lot#: [id number].  Exp: 09/2010.    Anticoagulation Management Assessment/Plan:      The patient's current anticoagulation dose is Coumadin 2.5 mg tabs: Take as directed by coumadin clinic..  The target INR is 2.0-3.0.  The next INR is due 08/05/2009.  Anticoagulation instructions were given to patient.  Results were reviewed/authorized by Cloyde Reams, RN, BSN.  She was notified by Cloyde Reams RN.         Prior Anticoagulation Instructions: INR 3  Continue same regimen of one tab daily (2.5 mg) except 1.5 tabs (3.75 mg) on Mondays and Fridays  Recheck in 4 weeks  Current Anticoagulation Instructions: INR 2.7  Continue on same dosage 1 tablet daily except 1.5 tablets on Mondays and Fridays.  Recheck in 4 weeks.

## 2010-07-22 NOTE — Medication Information (Signed)
Summary: rov/sp  Anticoagulant Therapy  Managed by: Weston Brass, PharmD Referring MD: Jetty Duhamel MD PCP: None Supervising MD: Ladona Ridgel MD, Sharlot Gowda Indication 1: Pulmonary Embolism Indication 2: DVT [place] Site: [address] INR POC 1.8 INR RANGE 2-3  Dietary changes: no    Health status changes: no    Bleeding/hemorrhagic complications: no    Recent/future hospitalizations: no    Any changes in medication regimen? no    Recent/future dental: no  Any missed doses?: no       Is patient compliant with meds? yes       Allergies: 1)  ! Vioxx 2)  ! * Ivp Dye 3)  ! Vancomycin  Anticoagulation Management History:      The patient is taking warfarin and comes in today for a routine follow up visit.  Positive risk factors for bleeding include an age of 74 years or older.  The bleeding index is 'intermediate risk'.  Negative CHADS2 values include Age > 43 years old.  Her last INR was 2.2.  Anticoagulation responsible provider: Ladona Ridgel MD, Sharlot Gowda.  INR POC: 1.8.  Cuvette Lot#: [id number].  Exp: 01/2011.    Anticoagulation Management Assessment/Plan:      The patient's current anticoagulation dose is Coumadin 2.5 mg tabs: Take as directed by coumadin clinic..  The target INR is 2.0-3.0.  The next INR is due 01/09/2010.  Anticoagulation instructions were given to patient.  Results were reviewed/authorized by Weston Brass, PharmD.  She was notified by Weston Brass PharmD.         Prior Anticoagulation Instructions: INR 2.2  Continue same dose of 1 tablet every day except 1 1/2 tablets on Monday, Wednesday and Friday.    Current Anticoagulation Instructions: INR 1.8  Take 1 1/2 tablets today then resume same dose of 1 tablet every day except 1 1/2 tablets on Monday, Wednesday and Friday.

## 2010-07-22 NOTE — Medication Information (Signed)
Summary: rov/sp  Anticoagulant Therapy  Managed by: Weston Brass, PharmD Referring MD: Jetty Duhamel MD PCP: None Supervising MD: Myrtis Ser MD, Tinnie Gens Indication 1: Pulmonary Embolism Indication 2: DVT [place] Site: [address] INR POC 2.1 INR RANGE 2-3  Dietary changes: no    Health status changes: no    Bleeding/hemorrhagic complications: no    Recent/future hospitalizations: no    Any changes in medication regimen? no    Recent/future dental: no  Any missed doses?: no       Is patient compliant with meds? yes       Allergies: 1)  ! Vioxx 2)  ! * Ivp Dye 3)  ! Vancomycin  Anticoagulation Management History:      The patient is taking warfarin and comes in today for a routine follow up visit.  Positive risk factors for bleeding include an age of 74 years or older.  The bleeding index is 'intermediate risk'.  Negative CHADS2 values include Age > 28 years old.  Her last INR was 2.2.  Anticoagulation responsible provider: Myrtis Ser MD, Tinnie Gens.  INR POC: 2.1.  Exp: 01/2011.    Anticoagulation Management Assessment/Plan:      The patient's current anticoagulation dose is Coumadin 2.5 mg tabs: Take as directed by coumadin clinic..  The target INR is 2.0-3.0.  The next INR is due 02/25/2010.  Anticoagulation instructions were given to patient.  Results were reviewed/authorized by Weston Brass, PharmD.  She was notified by Weston Brass PharmD.         Prior Anticoagulation Instructions: INR 1.9  Take 1 1/2 tablets today then resume same dose of 1 1/2 tablets every day except 1 tablet on Sunday, Tuesday and Thursday.   Current Anticoagulation Instructions: INR 2.1  Change dose to 1 1/2 tablets every day except 1 tablet on Sunday and Thursday.  Recheck INR in 3 weeks.

## 2010-07-24 NOTE — Medication Information (Signed)
Summary: rov/tp  Anticoagulant Therapy  Managed by: Geoffry Paradise, PHarmD Referring MD: Jetty Duhamel MD PCP: None Supervising MD: Gala Romney MD, Reuel Boom Indication 1: Pulmonary Embolism Indication 2: DVT [place] Site: [address] INR POC 2.5 INR RANGE 2-3  Dietary changes: no    Health status changes: no    Bleeding/hemorrhagic complications: no    Recent/future hospitalizations: no    Any changes in medication regimen? no    Recent/future dental: no  Any missed doses?: no       Is patient compliant with meds? yes       Allergies: 1)  ! Vioxx 2)  ! * Ivp Dye 3)  ! Vancomycin  Anticoagulation Management History:      Positive risk factors for bleeding include an age of 58 years or older.  The bleeding index is 'intermediate risk'.  Negative CHADS2 values include Age > 98 years old.  Her last INR was 2.2.  Anticoagulation responsible provider: Bensimhon MD, Reuel Boom.  INR POC: 2.5.  Cuvette Lot#: [id number].  Exp: 07/2011.    Anticoagulation Management Assessment/Plan:      The patient's current anticoagulation dose is Coumadin 2.5 mg tabs: Take as directed by coumadin clinic..  The target INR is 2.0-3.0.  The next INR is due 07/29/2010.  Anticoagulation instructions were given to patient.  Results were reviewed/authorized by Geoffry Paradise, PHarmD.         Prior Anticoagulation Instructions: INR 2.5 Continue 1 tablet everyday except 1.5 tablets on Mondays, Wednesdays and Fridays. Recheck in 4 weeks.   Current Anticoagulation Instructions: INR:  2.5  Your INR is at goal today.  Continue to take your Coumadin at 1 tablet everyday except 1.5 tablet on Monday, Wednesday, and Friday.  Return to clinic in 4 weeks for another INR check.

## 2010-07-24 NOTE — Assessment & Plan Note (Signed)
Summary: rov/jd   Primary Provider/Referring Provider:  None  CC:  Follow up visit-doing good with breathing-"once in a while having SOB with activity..  History of Present Illness: 03/01/09- Hx Pulmonary embolism/ coumadin, PVD,  She asks advice about diet an coumadin, saying advice from [facility] varied about eating greens. She went to ER for leg edema. Can't wear elastic hose- can't get them on. Legs are comfortable despite swelling and redness. Denies chest pain, palpitation, cough, wheeze, phlegm. No problems but she asks screening mamogram- none in long time. Discussed need to establish primary care physician.  August 02, 2009- Pulmonary embolism/ coumadin, PVD,  She asked to speak, and began a long narrative of her health hx in recent years. She reminds me tentative dx Parkinson's made in past. She expressed concern that her mental status had been questioned in past. She asks to reschedule mamogram. She tells me that she has managed to upset various medical providers, but isn't specific about what has happened. Was living in a [employer], without her own transportation..She has asked to get her coags drawn wherever she could get to, when she couldn't reach the [facility].  July 03, 2010-  Pulmonary embolism/ coumadin, PVD, hx CHF Nurse-CC: Follow up visit-doing good with breathing-"once in a while having SOB with activity. Now living in mother's house.  Continues f/u w/ coumadin clinic after DVT  Describes her arthritic problems- did Physical Therapy and saw Dr Sandria Manly. He thought she had Parkinson's- she disagreed.  Occasionally short of breath- maybe related to weather. Denies waking short of breath and denies cough or wheeze, chest pain or palpitation.      Preventive Screening-Counseling & Management  Alcohol-Tobacco     Smoking Status: never  Current Medications (verified): 1)  Coumadin 2.5 Mg Tabs (Warfarin Sodium) .... Take As Directed By  Coumadin Clinic. 2)  Acetaminophen 500 Mg Tabs (Acetaminophen) .... As Needed 3)  Lovenox 80 Mg/0.2ml Soln (Enoxaparin Sodium) .... Inject 70 Mg  Subcutaneously Into Abdomen Daily  Allergies (verified): 1)  ! Vioxx 2)  ! * Ivp Dye 3)  ! Vancomycin  Past History:  Past Surgical History: Last updated: 08/16/2009   History of an open reduction internal fixation  of the right lower extremity with bone grafting after a fracture as a  teenager.   Family History: Last updated: 08/16/2009 Question of clotting disorder Father- died prostate cancer, surgical complication Brother- died prostate cancer Mother- alive  Social History: Last updated: 07/03/2010 Patient never smoked.  No alcohol Unmarried No children retired Occupational psychologist Lives alone in [place] locally, mother lives with sister in [place].  Risk Factors: Smoking Status: never (07/03/2010)  Past Medical History:  LEG EDEMA (ICD-782.3) CONGESTIVE HEART FAILURE, HX OF (ICD-V12.50) COUMADIN THERAPY (ICD-V58.61) WEAKNESS (ICD-780.79) PULMONARY EMBOLISM, HX OF (ICD-V12.51) UNSPECIFIED VENOUS INSUFFICIENCY (ICD-459.81)  Social History: Patient never smoked.  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  Review of Systems      See HPI  The patient denies shortness of breath with activity, shortness of breath at rest, productive cough, non-productive cough, coughing up blood, chest pain, irregular heartbeats, acid heartburn, indigestion, loss of appetite, weight change, abdominal pain, difficulty swallowing, sore throat, tooth/dental problems, headaches, nasal congestion/difficulty breathing through nose, and sneezing.    Vital Signs:  Patient profile:   76 year old female Height:      63.5 inches O2 Sat:  98 % on Room air Pulse rate:   85 / minute BP sitting:   118 / 76  (left arm) Cuff  size:   regular  Vitals Entered By: Reynaldo Minium CMA (July 03, 2010 3:02 PM)  O2 Flow:  Room air CC: Follow up visit-doing good with breathing-"once in a while having SOB with activity.   Physical Exam  Additional Exam:  General: A/Ox3; pleasant and cooperative, NAD, weak elderly woman, oriented and responsive,  SKIN: no rash, lesions NODES: no lymphadenopathy HEENT: [place]/AT, EOM- WNL, Conjuctivae- clear, PERRLA, TM-WNL, Nose- clear, Throat- clear and wnl, NECK: Supple w/ fair ROM, JVD- none, normal carotid impulses w/o bruits Thyroid- . Neck is kept flexed. CHEST: clear to P&A, unlabored. No discrete mass, retraction, discharge or asymmetry. HEART: RRR, no m/g/r heard ABDOMEN: Soft and nl; nml bowel sounds; no organomegaly or masses noted RUE:AVWU, nl pulses, 2+ bilateral edema to knees, shiney with stasis changes mild erythema, NEURO: Grossly intact to observation- slow movements. resting tremor left hand, tremor in legs.       Impression & Recommendations:  Problem # 1:  PULMONARY EMBOLISM, HX OF (ICD-V12.51)  No evident recurrence but she has chronic venous insufficiency/ stasis changes in legs and should remain on chronic coumadin as long as it can be managed.  The following medications were removed from the medication list:    Lovenox 80 Mg/0.58ml Soln (Enoxaparin sodium) ..... Inject 70 mg  subcutaneously into abdomen daily Her updated medication list for this problem includes:    Coumadin 2.5 Mg Tabs (Warfarin sodium) .Marland Kitchen... Take as directed by coumadin clinic.  Problem # 2:  ? of PARKINSON'S DISEASE (ICD-332.0) She resists the diagnosis, but exam looks suggestive to me as a non-neurologist and she says Dr Imagene Gurney neurologic diagnosis was Parkinson's. She has significant resource limitations and staff have felt she was argumentative at times. I'm not sure about her ability to manage especially with limited finances.   Other Orders: Est. Patient Level III  (98119)  Patient Instructions: 1)  Please schedule a follow-up appointment in 1 year. 2)  Continue with Coumadin clinic.  3)  Need to establish primary physician. I can't serve that role.

## 2010-07-24 NOTE — Medication Information (Signed)
Summary: rov/mw  Anticoagulant Therapy  Managed by: Bethena Midget, RN, BSN Referring MD: Jetty Duhamel MD PCP: None Supervising MD: Eden Emms MD, Theron Arista Indication 1: Pulmonary Embolism Indication 2: DVT [place] Site: [address] INR POC 2.5 INR RANGE 2-3  Dietary changes: no    Health status changes: no    Bleeding/hemorrhagic complications: no    Recent/future hospitalizations: no    Any changes in medication regimen? no    Recent/future dental: no  Any missed doses?: no       Is patient compliant with meds? yes       Allergies: 1)  ! Vioxx 2)  ! * Ivp Dye 3)  ! Vancomycin  Anticoagulation Management History:      The patient is taking warfarin and comes in today for a routine follow up visit.  Positive risk factors for bleeding include an age of 11 years or older.  The bleeding index is 'intermediate risk'.  Negative CHADS2 values include Age > 6 years old.  Her last INR was 2.2.  Anticoagulation responsible provider: Eden Emms MD, Theron Arista.  INR POC: 2.5.  Exp: 04/2011.    Anticoagulation Management Assessment/Plan:      The patient's current anticoagulation dose is Coumadin 2.5 mg tabs: Take as directed by coumadin clinic..  The target INR is 2.0-3.0.  The next INR is due 07/03/2010.  Anticoagulation instructions were given to patient.  Results were reviewed/authorized by Bethena Midget, RN, BSN.  She was notified by Bethena Midget, RN, BSN.         Prior Anticoagulation Instructions: INR 3  Continue taking 1.5 tablets on monday, wednesday, and friday. And 1 tablet all other days. Recheck in 2 weeks.   Current Anticoagulation Instructions: INR 2.5 Continue 1 tablet everyday except 1.5 tablets on Mondays, Wednesdays and Fridays. Recheck in 4 weeks.  Prescriptions: COUMADIN 2.5 MG TABS (WARFARIN SODIUM) Take as directed by coumadin clinic.  #45 x 3   Entered by:   Bethena Midget, RN, BSN   Authorized by:   Nathen May, MD, [facility]   Signed by:   Bethena Midget, RN, BSN  on 06/05/2010   Method used:   Electronically to        [facility]  [phone]* (retail)       [address]       [place], [place]  [address]       Ph: [phone] or [phone]       Fax: [phone]   RxID:   [id number]

## 2010-07-29 ENCOUNTER — Encounter (INDEPENDENT_AMBULATORY_CARE_PROVIDER_SITE_OTHER): Payer: Medicare Other

## 2010-07-29 ENCOUNTER — Encounter: Payer: Self-pay | Admitting: Cardiovascular Disease

## 2010-07-29 DIAGNOSIS — Z7901 Long term (current) use of anticoagulants: Secondary | ICD-10-CM

## 2010-07-29 DIAGNOSIS — T81718A Complication of other artery following a procedure, not elsewhere classified, initial encounter: Secondary | ICD-10-CM

## 2010-07-29 LAB — CONVERTED CEMR LAB: POC INR: 2.6

## 2010-07-31 DIAGNOSIS — I2699 Other pulmonary embolism without acute cor pulmonale: Secondary | ICD-10-CM

## 2010-08-07 NOTE — Medication Information (Signed)
Summary: Coumadin Clinic  Anticoagulant Therapy  Managed by: Georgina Pillion, PharmD Referring MD: Jetty Duhamel MD PCP: None Supervising MD: Eden Emms MD, Theron Arista Indication 1: Pulmonary Embolism Indication 2: DVT [place] Site: [address] INR POC 2.6 INR RANGE 2-3  Dietary changes: no    Health status changes: no    Bleeding/hemorrhagic complications: no    Recent/future hospitalizations: no    Any changes in medication regimen? no    Recent/future dental: no  Any missed doses?: no       Is patient compliant with meds? yes       Allergies: 1)  ! Vioxx 2)  ! * Ivp Dye 3)  ! Vancomycin  Anticoagulation Management History:      Positive risk factors for bleeding include an age of 74 years or older.  The bleeding index is 'intermediate risk'.  Negative CHADS2 values include Age > 21 years old.  Her last INR was 2.2.  Anticoagulation responsible provider: Eden Emms MD, Theron Arista.  INR POC: 2.6.  Exp: 07/2011.    Anticoagulation Management Assessment/Plan:      The patient's current anticoagulation dose is Coumadin 2.5 mg tabs: Take as directed by coumadin clinic..  The target INR is 2.0-3.0.  The next INR is due 08/25/2010.  Anticoagulation instructions were given to patient.  Results were reviewed/authorized by Georgina Pillion, PharmD.  She was notified by Georgina Pillion PharmD.         Prior Anticoagulation Instructions: INR:  2.5  Your INR is at goal today.  Continue to take your Coumadin at 1 tablet everyday except 1.5 tablet on Monday, Wednesday, and Friday.  Return to clinic in 4 weeks for another INR check.    Current Anticoagulation Instructions: Continue current regimen of 1 tablet (2.5 mg) daily except for 1 1/2 tablets (3.75 mg) on Mondays, Wednesdays, and Fridays.  INR 2.6

## 2010-08-25 ENCOUNTER — Encounter (INDEPENDENT_AMBULATORY_CARE_PROVIDER_SITE_OTHER): Payer: Medicare Other

## 2010-08-25 ENCOUNTER — Encounter: Payer: Self-pay | Admitting: Cardiology

## 2010-08-25 DIAGNOSIS — I2699 Other pulmonary embolism without acute cor pulmonale: Secondary | ICD-10-CM | POA: Insufficient documentation

## 2010-08-25 DIAGNOSIS — Z86711 Personal history of pulmonary embolism: Secondary | ICD-10-CM | POA: Insufficient documentation

## 2010-08-25 DIAGNOSIS — Z7901 Long term (current) use of anticoagulants: Secondary | ICD-10-CM

## 2010-09-02 NOTE — Medication Information (Signed)
Summary: rov/sp  Anticoagulant Therapy  Managed by: Georgina Pillion, PharmD Referring MD: Jetty Duhamel MD PCP: None Supervising MD: Daleen Squibb MD, Maisie Fus Indication 1: Pulmonary Embolism Indication 2: DVT [place] Site: [address] INR POC 2.9 INR RANGE 2-3  Dietary changes: no    Health status changes: no    Bleeding/hemorrhagic complications: no    Recent/future hospitalizations: no    Any changes in medication regimen? no    Recent/future dental: no  Any missed doses?: no       Is patient compliant with meds? yes       Allergies: 1)  ! Vioxx 2)  ! * Ivp Dye 3)  ! Vancomycin  Anticoagulation Management History:      Positive risk factors for bleeding include an age of 74 years or older.  The bleeding index is 'intermediate risk'.  Negative CHADS2 values include Age > 58 years old.  Her last INR was 2.2.  Anticoagulation responsible provider: Daleen Squibb MD, Maisie Fus.  INR POC: 2.9.  Cuvette Lot#: [id number].  Exp: 06/2011.    Anticoagulation Management Assessment/Plan:      The patient's current anticoagulation dose is Coumadin 2.5 mg tabs: Take as directed by coumadin clinic..  The target INR is 2.0-3.0.  The next INR is due 09/22/2010.  Anticoagulation instructions were given to patient.  Results were reviewed/authorized by Georgina Pillion, PharmD.  She was notified by Georgina Pillion PharmD.         Prior Anticoagulation Instructions: Continue current regimen of 1 tablet (2.5 mg) daily except for 1 1/2 tablets (3.75 mg) on Mondays, Wednesdays, and Fridays.  INR 2.6  Current Anticoagulation Instructions: Continue current regimen of 1 tablet (2.5 mg) daily EXCEPT for 1 1/2 tablets (3.75 mg) on Mondays, Wednesdays, and Fridays.  INR 2.9 Prescriptions: COUMADIN 2.5 MG TABS (WARFARIN SODIUM) Take as directed by coumadin clinic.  #45 x 3   Entered by:   Georgina Pillion PharmD   Authorized by:   Gaylord Shih, MD, [facility]   Signed by:   Georgina Pillion PharmD on 08/25/2010  Method used:   Electronically to        [facility]  [phone]* (retail)       [address]       [place], [place]  [address]       Ph: [phone] or [phone]       Fax: [phone]   RxID:   [id number]

## 2010-09-10 LAB — URINALYSIS, ROUTINE W REFLEX MICROSCOPIC
Bilirubin Urine: NEGATIVE
Specific Gravity, Urine: 1.019 (ref 1.005–1.030)
pH: 7.5 (ref 5.0–8.0)

## 2010-09-10 LAB — CBC
HCT: 39.2 % (ref 36.0–46.0)
Hemoglobin: 13.5 g/dL (ref 12.0–15.0)
MCV: 85.3 fL (ref 78.0–100.0)
Platelets: 244 10*3/uL (ref 150–400)
WBC: 7.2 10*3/uL (ref 4.0–10.5)

## 2010-09-10 LAB — POCT TOXICOLOGY PANEL

## 2010-09-10 LAB — BASIC METABOLIC PANEL
BUN: 16 mg/dL (ref 6–23)
Chloride: 104 mEq/L (ref 96–112)
GFR calc non Af Amer: >60 mL/min (ref >60)
Glucose, Bld: 86 mg/dL (ref 70–99)
Potassium: 4.1 mEq/L (ref 3.5–5.1)
Sodium: 142 mEq/L (ref 135–145)

## 2010-09-10 LAB — DIFFERENTIAL
Eosinophils Absolute: 0.1 10*3/uL (ref 0.0–0.7)
Eosinophils Relative: 1 % (ref 0–5)
Lymphocytes Relative: 19 % (ref 12–46)
Lymphs Abs: 1.4 10*3/uL (ref 0.7–4.0)
Monocytes Absolute: 0.5 10*3/uL (ref 0.1–1.0)

## 2010-09-10 LAB — PROTIME-INR: Prothrombin Time: 23.3 seconds — ABNORMAL HIGH (ref 11.6–15.2)

## 2010-09-10 LAB — URINE MICROSCOPIC-ADD ON

## 2010-09-10 LAB — ETHANOL: Alcohol, Ethyl (B): <5 mg/dL (ref 0–10)

## 2010-09-22 ENCOUNTER — Other Ambulatory Visit: Payer: Self-pay

## 2010-09-22 ENCOUNTER — Ambulatory Visit (INDEPENDENT_AMBULATORY_CARE_PROVIDER_SITE_OTHER): Payer: Medicare Other | Admitting: *Deleted

## 2010-09-22 DIAGNOSIS — I2699 Other pulmonary embolism without acute cor pulmonale: Secondary | ICD-10-CM

## 2010-09-22 DIAGNOSIS — Z7901 Long term (current) use of anticoagulants: Secondary | ICD-10-CM | POA: Insufficient documentation

## 2010-09-22 LAB — POCT INR: INR: 3.7

## 2010-09-22 MED ORDER — WARFARIN SODIUM 2.5 MG PO TABS: 2.5000 mg | ORAL_TABLET | ORAL | Status: DC

## 2010-09-22 NOTE — Patient Instructions (Signed)
Skip today's dosage of Coumadin, then resume same dosage 1 tablet daily except 1.5 tablets on Mondays, Wednesdays and Fridays.  Recheck in 2 weeks.

## 2010-09-27 LAB — DIFFERENTIAL
Basophils Relative: 0 % (ref 0–1)
Eosinophils Absolute: 0.2 10*3/uL (ref 0.0–0.7)
Lymphs Abs: 1.7 10*3/uL (ref 0.7–4.0)
Monocytes Relative: 8 % (ref 3–12)
Neutro Abs: 4.9 10*3/uL (ref 1.7–7.7)
Neutrophils Relative %: 66 % (ref 43–77)

## 2010-09-27 LAB — CBC
MCV: 85 fL (ref 78.0–100.0)
Platelets: 251 10*3/uL (ref 150–400)
Platelets: 262 10*3/uL (ref 150–400)
RBC: 4.71 MIL/uL (ref 3.87–5.11)
RDW: 14.1 % (ref 11.5–15.5)
WBC: 10.5 10*3/uL (ref 4.0–10.5)
WBC: 7.5 10*3/uL (ref 4.0–10.5)

## 2010-09-27 LAB — BASIC METABOLIC PANEL
BUN: 11 mg/dL (ref 6–23)
BUN: 14 mg/dL (ref 6–23)
Calcium: 9 mg/dL (ref 8.4–10.5)
Calcium: 9.3 mg/dL (ref 8.4–10.5)
Chloride: 107 mEq/L (ref 96–112)
Creatinine, Ser: 0.72 mg/dL (ref 0.4–1.2)
GFR calc Af Amer: >60 mL/min (ref >60)
GFR calc non Af Amer: >60 mL/min (ref >60)
Glucose, Bld: 102 mg/dL — ABNORMAL HIGH (ref 70–99)
Sodium: 141 mEq/L (ref 135–145)

## 2010-09-27 LAB — PROTIME-INR
INR: 3.1 — ABNORMAL HIGH (ref 0.00–1.49)
INR: 3.4 — ABNORMAL HIGH (ref 0.00–1.49)
Prothrombin Time: 37.2 seconds — ABNORMAL HIGH (ref 11.6–15.2)

## 2010-09-27 LAB — URINALYSIS, ROUTINE W REFLEX MICROSCOPIC
Glucose, UA: NEGATIVE mg/dL
Nitrite: NEGATIVE
Specific Gravity, Urine: 1.014 (ref 1.005–1.030)
pH: 5.5 (ref 5.0–8.0)

## 2010-09-27 LAB — URINE MICROSCOPIC-ADD ON

## 2010-09-27 LAB — TSH: TSH: 2.06 u[IU]/mL (ref 0.350–4.500)

## 2010-09-27 LAB — APTT: aPTT: 45 seconds — ABNORMAL HIGH (ref 24–37)

## 2010-10-06 ENCOUNTER — Encounter: Payer: Medicare Other | Admitting: *Deleted

## 2010-11-04 NOTE — H&P (Signed)
[name], [name]                ACCOUNT NO.:  [id number]   MEDICAL RECORD NO.:  [id number]          PATIENT TYPE:  OBV   LOCATION:  1530                         FACILITY:  [facility]   PHYSICIAN:  Della Goo, M.D. DATE OF BIRTH:  [birth year]   DATE OF ADMISSION:  01/20/2009  DATE OF DISCHARGE:                              HISTORY & PHYSICAL   DATE OF ADMISSION:  January 20, 2009.   PRIMARY CARE PHYSICIAN:  Unassigned.   CHIEF COMPLAINT:  Weakness, difficulty walking.   HISTORY OF PRESENT ILLNESS:  This is a 76 year old female who presents  to the emergency department secondary to complaints of increased  weakness and difficulty walking over the past 2 days.  The patient  reports having increased pain radiating across her low back into her  buttocks and down her left leg.  She reports having muscle spasms as  well.  She states that she has been unable to walk over the past 24  hours.  She denies having any trauma or fall.  She denies having any  dysuria or urinary incontinence.  She denies having any bowel changes.  She also denies having any fevers, chills, nausea, vomiting, diarrhea or  constipation.   PAST MEDICAL HISTORY:  Significant for previous pulmonary embolism, on  chronic Coumadin therapy, history of venous stasis changes of both lower  extremities.   Her medications at this time include Coumadin 2.5 mg p.o. daily except  on Mondays and Fridays.  On Monday and Friday the dosage is 3.75 mg.  Also, she takes Tylenol 325 mg p.o. q.6h. p.r.n. pain.   ALLERGIES:  To IV CONTRAST DYE, IODINE, VANCOMYCIN, ERYTHROMYCIN, VIOXX,  DARVOCET, and VICODIN.   PAST SURGICAL HISTORY:  History of an open reduction internal fixation  of the right lower extremity with bone grafting after a fracture as a  teenager.   SOCIAL HISTORY:  The patient is very guarded about her living situation.  She is living in a [employer].  She is a nonsmoker, nondrinker, and has no  history of illicit  drug usage.   FAMILY HISTORY:  Noncontributory.   REVIEW OF SYSTEMS:  Pertinent are mentioned above in the HPI.   PHYSICAL EXAMINATION FINDINGS:  This is a 76 year old well-nourished,  well-developed female in discomfort but no acute distress.  VITAL SIGNS: Temperature 98.3, blood pressure 161/82, heart rate 76,  respirations 16, O2 saturation 99%.  HEENT: Examination normocephalic, atraumatic.  There is no scleral  icterus.  Pupils equally round reactive to light.  Extraocular movements  are intact funduscopic benign.  Nares are patent bilaterally.  Oropharynx is clear.  Neck is supple.  Full range of motion.  No thyromegaly, adenopathy to a  venous distention.  CARDIOVASCULAR:  Regular rate and rhythm.  No murmurs, gallops or rubs.  LUNGS: Clear to auscultation bilaterally.  ABDOMEN:  Positive bowel sounds, soft, nontender, nondistended.  No  hepatosplenomegaly.  No costovertebral angle tenderness.  No suprapubic  tenderness.  BACK:  No spinous process tenderness.  EXTREMITIES: Without cyanosis, clubbing or edema.  The patient does have  hyperpigmentation to the pre mid  anterior tibial areas of both lower  extremities.  MUSCULOSKELETAL:  The patient does have point tenderness along the SI  areas bilaterally.  The patient is unable to perform straight leg raises  at this time.  The patient has not been able to stand for gait  assessment.  She is unable to stand without assistance.  NEUROLOGIC:  Patient is alert and oriented x3.  Cranial nerves are  intact.  Motor and sensation intact.  Motor function also grossly  intact.  She does have generalized weakness.   LABORATORY STUDIES:  White blood cell count 7.5, hemoglobin 13.4,  hematocrit 40.0, MCV 85.0, platelets 151, neutrophils 66% lymphocytes  23%.  Sodium 140, potassium 3.9, chloride 107, carbon dioxide 26, BUN  14, creatinine 0.72, glucose 91.  Urinalysis reveals large urine  hemoglobin and trace leukocyte esterase.  Urine  microscopic with 0 to 2  white blood cells 7 to 10 red blood cells.   X-rays of the lumbar spine revealed lumbar scoliosis with disk disease  and facet disease but no acute bony findings.   Left hip x-ray also reveals no acute bony findings.  The sacroiliac  joints are intact on the left hip x-rays well.   ASSESSMENT:  A 76 year old female being admitted for 23-year observation  with:  1. Weakness.  2. Lumbar disk disease with sciatica of the left lower extremity.  3. Muscle spasm secondary to #2.  4. Urinary tract infection.  5. Coagulopathy secondary to Coumadin therapy.   PLAN:  The patient will be admitted for 23-hour observation.  The  patient is allergic to multiple medications and is resistant to having  narcotic therapy.  Tylenol has been ordered for her pain.  The patient  has declined to have muscle relaxant therapy prescribed as well.  IV  Rocephin has been ordered to cover her urinary tract infection.  Her  Coumadin therapy will continue, and her PT and INR will be monitored and  adjusted as needed.  Case management evaluation will also be requested  secondary to the patient's living circumstances.      Della Goo, M.D.  Electronically Signed     HJ/MEDQ  D:  01/20/2009  T:  01/20/2009  Job:  [id number]

## 2010-11-06 ENCOUNTER — Ambulatory Visit (INDEPENDENT_AMBULATORY_CARE_PROVIDER_SITE_OTHER): Payer: Medicare Other | Admitting: *Deleted

## 2010-11-06 DIAGNOSIS — I2699 Other pulmonary embolism without acute cor pulmonale: Secondary | ICD-10-CM

## 2010-11-06 LAB — POCT INR: INR: 3.8

## 2010-11-12 ENCOUNTER — Telehealth: Payer: Self-pay

## 2010-11-12 ENCOUNTER — Telehealth: Payer: Self-pay | Admitting: *Deleted

## 2010-11-12 NOTE — Telephone Encounter (Signed)
Patient called to get a clarification of  Coumadin dosage.  She was confused about the handout of coumadin instructions and is not sure of whether she has actually taken 1 or 1.5 tablets since 11/07/10 (the day after coumadin clinic visit.)  I gave instructions until patient was able to correctly verbalize the instructions on her own.  I also encouraged her to come into the clinic so that we can see what her INR is as soon as possible.  She refuses to come in before her scheduled visit on 11/20/10.Marland KitchenMarland KitchenMarland KitchenMarland Kitchenpwc

## 2010-11-12 NOTE — Telephone Encounter (Signed)
Pt called and wanted to check dose of coumadin.  When told that the phone call would be returned she stated that would not be convenient for her and to cancel the call.

## 2010-11-21 ENCOUNTER — Ambulatory Visit (INDEPENDENT_AMBULATORY_CARE_PROVIDER_SITE_OTHER): Payer: Medicare Other | Admitting: *Deleted

## 2010-11-21 DIAGNOSIS — I2699 Other pulmonary embolism without acute cor pulmonale: Secondary | ICD-10-CM

## 2010-11-21 MED ORDER — WARFARIN SODIUM 2.5 MG PO TABS: 2.5000 mg | ORAL_TABLET | ORAL | Status: DC

## 2010-12-11 ENCOUNTER — Ambulatory Visit (INDEPENDENT_AMBULATORY_CARE_PROVIDER_SITE_OTHER): Payer: Medicare Other | Admitting: *Deleted

## 2010-12-11 DIAGNOSIS — I2699 Other pulmonary embolism without acute cor pulmonale: Secondary | ICD-10-CM

## 2011-01-01 ENCOUNTER — Ambulatory Visit (INDEPENDENT_AMBULATORY_CARE_PROVIDER_SITE_OTHER): Payer: Medicare Other | Admitting: *Deleted

## 2011-01-01 DIAGNOSIS — I2699 Other pulmonary embolism without acute cor pulmonale: Secondary | ICD-10-CM

## 2011-01-13 ENCOUNTER — Ambulatory Visit (INDEPENDENT_AMBULATORY_CARE_PROVIDER_SITE_OTHER): Payer: Medicare Other | Admitting: *Deleted

## 2011-01-13 DIAGNOSIS — I2699 Other pulmonary embolism without acute cor pulmonale: Secondary | ICD-10-CM

## 2011-01-13 MED ORDER — WARFARIN SODIUM 2.5 MG PO TABS: ORAL_TABLET | ORAL | Status: DC

## 2011-01-15 ENCOUNTER — Encounter: Payer: Medicare Other | Admitting: *Deleted

## 2011-02-03 ENCOUNTER — Encounter: Payer: Medicare Other | Admitting: *Deleted

## 2011-02-13 ENCOUNTER — Encounter: Payer: Medicare Other | Admitting: *Deleted

## 2011-02-13 ENCOUNTER — Ambulatory Visit (INDEPENDENT_AMBULATORY_CARE_PROVIDER_SITE_OTHER): Payer: Medicare Other | Admitting: *Deleted

## 2011-02-13 DIAGNOSIS — I2699 Other pulmonary embolism without acute cor pulmonale: Secondary | ICD-10-CM

## 2011-02-26 ENCOUNTER — Other Ambulatory Visit: Payer: Self-pay | Admitting: Internal Medicine

## 2011-02-26 DIAGNOSIS — Z1231 Encounter for screening mammogram for malignant neoplasm of breast: Secondary | ICD-10-CM

## 2011-03-05 ENCOUNTER — Ambulatory Visit
Admission: RE | Admit: 2011-03-05 | Discharge: 2011-03-05 | Disposition: A | Discharge status: Home / Self Care | Payer: Medicare Other | Source: Ambulatory Visit | Attending: Internal Medicine | Admitting: Internal Medicine

## 2011-03-05 DIAGNOSIS — Z1231 Encounter for screening mammogram for malignant neoplasm of breast: Secondary | ICD-10-CM

## 2011-03-06 ENCOUNTER — Ambulatory Visit: Payer: Medicare Other

## 2011-03-13 ENCOUNTER — Ambulatory Visit (INDEPENDENT_AMBULATORY_CARE_PROVIDER_SITE_OTHER): Payer: Medicare Other | Admitting: *Deleted

## 2011-03-13 DIAGNOSIS — I2699 Other pulmonary embolism without acute cor pulmonale: Secondary | ICD-10-CM

## 2011-03-13 LAB — POCT INR: INR: 2.8

## 2011-03-13 MED ORDER — WARFARIN SODIUM 2.5 MG PO TABS: ORAL_TABLET | ORAL | Status: DC

## 2011-04-06 ENCOUNTER — Ambulatory Visit (INDEPENDENT_AMBULATORY_CARE_PROVIDER_SITE_OTHER): Payer: Medicare Other | Admitting: *Deleted

## 2011-04-06 DIAGNOSIS — I2699 Other pulmonary embolism without acute cor pulmonale: Secondary | ICD-10-CM

## 2011-04-06 LAB — POCT INR: INR: 3.2

## 2011-04-10 ENCOUNTER — Encounter: Payer: Medicare Other | Admitting: *Deleted

## 2011-04-27 ENCOUNTER — Ambulatory Visit (INDEPENDENT_AMBULATORY_CARE_PROVIDER_SITE_OTHER): Payer: Medicare Other | Admitting: *Deleted

## 2011-04-27 DIAGNOSIS — Z7901 Long term (current) use of anticoagulants: Secondary | ICD-10-CM

## 2011-04-27 DIAGNOSIS — I2699 Other pulmonary embolism without acute cor pulmonale: Secondary | ICD-10-CM

## 2011-04-27 LAB — POCT INR: INR: 3.7

## 2011-04-27 MED ORDER — WARFARIN SODIUM 2.5 MG PO TABS: ORAL_TABLET | ORAL | Status: DC

## 2011-05-08 ENCOUNTER — Ambulatory Visit (INDEPENDENT_AMBULATORY_CARE_PROVIDER_SITE_OTHER): Payer: Medicare Other | Admitting: *Deleted

## 2011-05-08 DIAGNOSIS — I2699 Other pulmonary embolism without acute cor pulmonale: Secondary | ICD-10-CM

## 2011-05-08 DIAGNOSIS — Z7901 Long term (current) use of anticoagulants: Secondary | ICD-10-CM

## 2011-05-08 LAB — POCT INR: INR: 2.5

## 2011-05-11 ENCOUNTER — Encounter: Payer: Medicare Other | Admitting: *Deleted

## 2011-05-29 ENCOUNTER — Ambulatory Visit (INDEPENDENT_AMBULATORY_CARE_PROVIDER_SITE_OTHER): Payer: Medicare Other | Admitting: *Deleted

## 2011-05-29 DIAGNOSIS — I2699 Other pulmonary embolism without acute cor pulmonale: Secondary | ICD-10-CM

## 2011-05-29 DIAGNOSIS — Z7901 Long term (current) use of anticoagulants: Secondary | ICD-10-CM

## 2011-06-26 ENCOUNTER — Ambulatory Visit (INDEPENDENT_AMBULATORY_CARE_PROVIDER_SITE_OTHER): Payer: Medicare Other | Admitting: *Deleted

## 2011-06-26 DIAGNOSIS — Z7901 Long term (current) use of anticoagulants: Secondary | ICD-10-CM

## 2011-06-26 DIAGNOSIS — I2699 Other pulmonary embolism without acute cor pulmonale: Secondary | ICD-10-CM

## 2011-06-26 LAB — POCT INR: INR: 2.7

## 2011-07-24 ENCOUNTER — Encounter: Payer: Medicare Other | Admitting: *Deleted

## 2011-07-28 ENCOUNTER — Ambulatory Visit (INDEPENDENT_AMBULATORY_CARE_PROVIDER_SITE_OTHER): Payer: Medicare Other | Admitting: *Deleted

## 2011-07-28 DIAGNOSIS — Z7901 Long term (current) use of anticoagulants: Secondary | ICD-10-CM

## 2011-07-28 DIAGNOSIS — I2699 Other pulmonary embolism without acute cor pulmonale: Secondary | ICD-10-CM

## 2011-07-28 LAB — POCT INR: INR: 2.9

## 2011-07-28 MED ORDER — WARFARIN SODIUM 2.5 MG PO TABS: ORAL_TABLET | ORAL | Status: DC

## 2011-08-25 ENCOUNTER — Ambulatory Visit (INDEPENDENT_AMBULATORY_CARE_PROVIDER_SITE_OTHER): Payer: Medicare Other | Admitting: *Deleted

## 2011-08-25 DIAGNOSIS — I2699 Other pulmonary embolism without acute cor pulmonale: Secondary | ICD-10-CM

## 2011-08-25 DIAGNOSIS — Z7901 Long term (current) use of anticoagulants: Secondary | ICD-10-CM

## 2011-08-25 LAB — POCT INR: INR: 2.9

## 2011-09-17 ENCOUNTER — Ambulatory Visit (INDEPENDENT_AMBULATORY_CARE_PROVIDER_SITE_OTHER): Payer: Medicare Other

## 2011-09-17 DIAGNOSIS — I2699 Other pulmonary embolism without acute cor pulmonale: Secondary | ICD-10-CM

## 2011-09-17 DIAGNOSIS — Z7901 Long term (current) use of anticoagulants: Secondary | ICD-10-CM

## 2011-09-17 MED ORDER — WARFARIN SODIUM 2.5 MG PO TABS: ORAL_TABLET | ORAL | Status: DC

## 2011-10-14 ENCOUNTER — Ambulatory Visit (INDEPENDENT_AMBULATORY_CARE_PROVIDER_SITE_OTHER): Payer: Medicare Other | Admitting: Pharmacist

## 2011-10-14 DIAGNOSIS — Z7901 Long term (current) use of anticoagulants: Secondary | ICD-10-CM

## 2011-10-14 DIAGNOSIS — I2699 Other pulmonary embolism without acute cor pulmonale: Secondary | ICD-10-CM

## 2011-11-04 ENCOUNTER — Ambulatory Visit (INDEPENDENT_AMBULATORY_CARE_PROVIDER_SITE_OTHER): Payer: Medicare Other | Admitting: *Deleted

## 2011-11-04 DIAGNOSIS — I2699 Other pulmonary embolism without acute cor pulmonale: Secondary | ICD-10-CM

## 2011-11-04 DIAGNOSIS — Z7901 Long term (current) use of anticoagulants: Secondary | ICD-10-CM

## 2011-12-11 ENCOUNTER — Ambulatory Visit (INDEPENDENT_AMBULATORY_CARE_PROVIDER_SITE_OTHER): Payer: Medicare Other | Admitting: *Deleted

## 2011-12-11 DIAGNOSIS — I2699 Other pulmonary embolism without acute cor pulmonale: Secondary | ICD-10-CM

## 2011-12-11 DIAGNOSIS — Z7901 Long term (current) use of anticoagulants: Secondary | ICD-10-CM

## 2012-01-04 ENCOUNTER — Other Ambulatory Visit: Payer: Self-pay | Admitting: Cardiology

## 2012-01-04 NOTE — Telephone Encounter (Signed)
Please refill Thanks Lanyia Jewel 

## 2012-01-05 ENCOUNTER — Ambulatory Visit (INDEPENDENT_AMBULATORY_CARE_PROVIDER_SITE_OTHER): Payer: Medicare Other | Admitting: *Deleted

## 2012-01-05 DIAGNOSIS — I2699 Other pulmonary embolism without acute cor pulmonale: Secondary | ICD-10-CM

## 2012-01-05 DIAGNOSIS — Z7901 Long term (current) use of anticoagulants: Secondary | ICD-10-CM

## 2012-01-05 LAB — POCT INR: INR: 2.3

## 2012-02-05 ENCOUNTER — Ambulatory Visit (INDEPENDENT_AMBULATORY_CARE_PROVIDER_SITE_OTHER): Payer: Medicare Other

## 2012-02-05 DIAGNOSIS — I2699 Other pulmonary embolism without acute cor pulmonale: Secondary | ICD-10-CM

## 2012-02-05 DIAGNOSIS — Z7901 Long term (current) use of anticoagulants: Secondary | ICD-10-CM

## 2012-02-05 LAB — POCT INR: INR: 2.6

## 2012-03-04 ENCOUNTER — Ambulatory Visit (INDEPENDENT_AMBULATORY_CARE_PROVIDER_SITE_OTHER): Payer: Medicare Other

## 2012-03-04 DIAGNOSIS — I2699 Other pulmonary embolism without acute cor pulmonale: Secondary | ICD-10-CM

## 2012-03-04 DIAGNOSIS — Z7901 Long term (current) use of anticoagulants: Secondary | ICD-10-CM

## 2012-03-04 LAB — POCT INR: INR: 2.9

## 2012-03-04 MED ORDER — WARFARIN SODIUM 2.5 MG PO TABS: ORAL_TABLET | ORAL | Status: DC

## 2012-03-25 ENCOUNTER — Telehealth: Payer: Self-pay

## 2012-03-25 NOTE — Telephone Encounter (Signed)
Return call to pt she states she didn't have any questions at this time.

## 2012-03-25 NOTE — Telephone Encounter (Signed)
New Problem: ° ° ° °Patient called in wanting to ask you a few questions.  Please call back. °

## 2012-03-28 ENCOUNTER — Other Ambulatory Visit: Payer: Self-pay | Admitting: *Deleted

## 2012-03-28 MED ORDER — WARFARIN SODIUM 2.5 MG PO TABS: ORAL_TABLET | ORAL | Status: DC

## 2012-03-29 ENCOUNTER — Telehealth: Payer: Self-pay | Admitting: Internal Medicine

## 2012-03-29 NOTE — Telephone Encounter (Signed)
The pt called hoping to be scheduled as a new patient.  She was offered the next available apt which is in April 2014 and a time slot of 9:30am.  She stated she needs an appointment at 2pm as soon as possible.  She is hoping an exception can be made.  Let me know and i'll be happy to call her back.   Thanks!

## 2012-03-29 NOTE — Telephone Encounter (Signed)
Ok to accommodate a new pt slot at 2pm, but not before 2014 - thanks

## 2012-04-08 ENCOUNTER — Ambulatory Visit (INDEPENDENT_AMBULATORY_CARE_PROVIDER_SITE_OTHER): Payer: Medicare Other | Admitting: General Practice

## 2012-04-08 DIAGNOSIS — Z7901 Long term (current) use of anticoagulants: Secondary | ICD-10-CM

## 2012-04-08 DIAGNOSIS — I2699 Other pulmonary embolism without acute cor pulmonale: Secondary | ICD-10-CM

## 2012-04-08 LAB — POCT INR: INR: 3.5

## 2012-04-27 ENCOUNTER — Other Ambulatory Visit: Payer: Self-pay | Admitting: General Practice

## 2012-04-27 ENCOUNTER — Ambulatory Visit (INDEPENDENT_AMBULATORY_CARE_PROVIDER_SITE_OTHER): Payer: Medicare Other | Admitting: General Practice

## 2012-04-27 DIAGNOSIS — Z7901 Long term (current) use of anticoagulants: Secondary | ICD-10-CM

## 2012-04-27 DIAGNOSIS — I2699 Other pulmonary embolism without acute cor pulmonale: Secondary | ICD-10-CM

## 2012-04-27 LAB — POCT INR: INR: 2.8

## 2012-04-28 ENCOUNTER — Other Ambulatory Visit: Payer: Self-pay | Admitting: General Practice

## 2012-06-24 ENCOUNTER — Other Ambulatory Visit: Payer: Self-pay | Admitting: General Practice

## 2012-06-24 ENCOUNTER — Ambulatory Visit (INDEPENDENT_AMBULATORY_CARE_PROVIDER_SITE_OTHER): Payer: Medicare Other | Admitting: General Practice

## 2012-06-24 DIAGNOSIS — Z7901 Long term (current) use of anticoagulants: Secondary | ICD-10-CM

## 2012-06-24 DIAGNOSIS — I2699 Other pulmonary embolism without acute cor pulmonale: Secondary | ICD-10-CM

## 2012-06-24 LAB — POCT INR: INR: 2.4

## 2012-06-24 MED ORDER — WARFARIN SODIUM 2.5 MG PO TABS: ORAL_TABLET | ORAL | Status: DC

## 2012-07-04 ENCOUNTER — Ambulatory Visit: Payer: Medicare Other | Admitting: Internal Medicine

## 2012-08-19 ENCOUNTER — Ambulatory Visit (INDEPENDENT_AMBULATORY_CARE_PROVIDER_SITE_OTHER): Payer: Medicare Other | Admitting: *Deleted

## 2012-08-19 DIAGNOSIS — I2699 Other pulmonary embolism without acute cor pulmonale: Secondary | ICD-10-CM

## 2012-08-19 DIAGNOSIS — Z7901 Long term (current) use of anticoagulants: Secondary | ICD-10-CM

## 2012-08-19 LAB — POCT INR: INR: 2.6

## 2012-08-19 MED ORDER — WARFARIN SODIUM 2.5 MG PO TABS: ORAL_TABLET | ORAL | Status: DC

## 2012-08-24 ENCOUNTER — Telehealth: Payer: Self-pay

## 2012-08-24 NOTE — Telephone Encounter (Signed)
Called spoke with pt, pt reports sl amount of blood after BM yesterday when wiping.  Pt reports having to strain while attempting to have BM.  Pt states she saw BRB on toilet paper after she wiped each time she used the bathroom afterward yesterday and and even today although amount reported is slight and lessening.  Advised pt not to skip a dose of Coumadin, offered appt to have INR checked, pt declined at this time.  Last INR was 2.6 on 08/19/12, advised pt it does not sound per her report that the bleeding is related to pt's blood being too thin.  Coumadin can increase amt of blood see, but most likely their is a different underlying cause of this bleeding, more likely straining and irritation/inflammation.  Advised pt to continue to monitor and call back for an appt if bleeding persists or increases.  If bleeding increases significantly advised pt to seek immediate medical attention.  Pt verbalized understanding.

## 2012-09-16 ENCOUNTER — Ambulatory Visit (INDEPENDENT_AMBULATORY_CARE_PROVIDER_SITE_OTHER): Payer: Medicare Other | Admitting: General Practice

## 2012-09-16 DIAGNOSIS — Z7901 Long term (current) use of anticoagulants: Secondary | ICD-10-CM

## 2012-09-16 DIAGNOSIS — I2699 Other pulmonary embolism without acute cor pulmonale: Secondary | ICD-10-CM

## 2012-10-04 ENCOUNTER — Telehealth: Payer: Self-pay

## 2012-10-04 NOTE — Telephone Encounter (Signed)
New Problem:    Patient called in because she had a question she would like to ask you.  Please call back.

## 2012-10-04 NOTE — Telephone Encounter (Signed)
Pt states she had bleeding months ago which resolved see telephone note in [facility] 08/24/12, but has now returned.  Pt states her new pt appt with Dr Felicity Coyer isn't until August and they can't work her in sooner.  She requested a referral to a GYN, but they stated they would have to see her first.  Advised pt she could try to call a GYN office herself and schedule a sooner appt. Pt agreed to call for GYN appt.  Last INR 2.4 on 09/16/12 advised pt because of the episodic bleeding pattern Coumadin is not likely to be the cause although it can make any underlying bleeding problem worse.  Advised pt to call for appt to evaluate.  Advised pt if bleeding worsens to go to ED for evaluation.

## 2012-10-10 ENCOUNTER — Telehealth: Payer: Self-pay | Admitting: General Practice

## 2012-10-10 ENCOUNTER — Encounter: Payer: Self-pay | Admitting: General Practice

## 2012-10-10 NOTE — Telephone Encounter (Signed)
Patient left message to verify appointment time.

## 2012-10-10 NOTE — Telephone Encounter (Signed)
[facility] verifying appointment time.

## 2012-11-04 ENCOUNTER — Ambulatory Visit (INDEPENDENT_AMBULATORY_CARE_PROVIDER_SITE_OTHER): Payer: Medicare Other | Admitting: General Practice

## 2012-11-04 ENCOUNTER — Other Ambulatory Visit: Payer: Self-pay | Admitting: General Practice

## 2012-11-04 DIAGNOSIS — I2699 Other pulmonary embolism without acute cor pulmonale: Secondary | ICD-10-CM

## 2012-11-04 DIAGNOSIS — Z7901 Long term (current) use of anticoagulants: Secondary | ICD-10-CM

## 2012-11-04 LAB — POCT INR: INR: 2.2

## 2012-12-30 ENCOUNTER — Ambulatory Visit (INDEPENDENT_AMBULATORY_CARE_PROVIDER_SITE_OTHER): Payer: Medicare Other | Admitting: General Practice

## 2012-12-30 DIAGNOSIS — I2699 Other pulmonary embolism without acute cor pulmonale: Secondary | ICD-10-CM

## 2012-12-30 DIAGNOSIS — Z7901 Long term (current) use of anticoagulants: Secondary | ICD-10-CM

## 2013-01-25 ENCOUNTER — Ambulatory Visit: Payer: Medicare Other | Admitting: Internal Medicine

## 2013-02-23 ENCOUNTER — Telehealth: Payer: Self-pay | Admitting: General Practice

## 2013-02-23 NOTE — Telephone Encounter (Signed)
Returned call to patient/No answer/line busy.

## 2013-03-08 ENCOUNTER — Ambulatory Visit (INDEPENDENT_AMBULATORY_CARE_PROVIDER_SITE_OTHER): Payer: Medicare Other | Admitting: General Practice

## 2013-03-08 DIAGNOSIS — I2699 Other pulmonary embolism without acute cor pulmonale: Secondary | ICD-10-CM

## 2013-03-08 DIAGNOSIS — Z7901 Long term (current) use of anticoagulants: Secondary | ICD-10-CM

## 2013-03-08 LAB — POCT INR: INR: 2.4

## 2013-04-19 ENCOUNTER — Ambulatory Visit (INDEPENDENT_AMBULATORY_CARE_PROVIDER_SITE_OTHER): Payer: Medicare Other | Admitting: General Practice

## 2013-04-19 DIAGNOSIS — Z7901 Long term (current) use of anticoagulants: Secondary | ICD-10-CM

## 2013-04-19 DIAGNOSIS — I2699 Other pulmonary embolism without acute cor pulmonale: Secondary | ICD-10-CM

## 2013-05-01 ENCOUNTER — Ambulatory Visit (INDEPENDENT_AMBULATORY_CARE_PROVIDER_SITE_OTHER): Payer: Medicare Other | Admitting: Podiatry

## 2013-05-01 ENCOUNTER — Encounter: Payer: Self-pay | Admitting: Podiatry

## 2013-05-01 VITALS — BP 138/76 | HR 77 | Resp 16 | Ht 64.0 in | Wt 150.0 lb

## 2013-05-01 DIAGNOSIS — L84 Corns and callosities: Secondary | ICD-10-CM

## 2013-05-01 NOTE — Progress Notes (Signed)
Patient ID: [name], female   DOB: [birth year], 78 y.o.   MRN: [id number]  Subjective: She complaining of painful callus plantar right foot. Also wants posterior heelsd evaluated.  Objective: Well-organized hyperkeratotic lesion plantar fifth right MPJ noted. Erythema on the posterior right heels noted without any open lesions.  Assessment: Plantar hyperkeratoses right fifth MPJ Pre-ulcerative skin lesions posterior heels bilaterally.  Plan: The hyperkeratotic lesion was debrided without any bleeding. Patient was advised to purchase heel protectors for the right and left heels. Reappoint at patient's request.  Adylene Dlugosz C.Leeanne Deed, DPM

## 2013-05-01 NOTE — Patient Instructions (Signed)
Wear heel protectors on the right and left heels to prevent skin ulcers when lying down . May purchase heel protectors at [facility] medical supply.

## 2013-06-07 ENCOUNTER — Ambulatory Visit (INDEPENDENT_AMBULATORY_CARE_PROVIDER_SITE_OTHER): Payer: Medicare Other | Admitting: General Practice

## 2013-06-07 DIAGNOSIS — I2699 Other pulmonary embolism without acute cor pulmonale: Secondary | ICD-10-CM

## 2013-06-07 DIAGNOSIS — Z7901 Long term (current) use of anticoagulants: Secondary | ICD-10-CM

## 2013-06-07 LAB — POCT INR: INR: 2.2

## 2013-06-07 NOTE — Progress Notes (Signed)
Pre-visit discussion using our clinic review tool. No additional management support is needed unless otherwise documented below in the visit note.  

## 2013-07-31 ENCOUNTER — Other Ambulatory Visit: Payer: Self-pay | Admitting: General Practice

## 2013-07-31 MED ORDER — WARFARIN SODIUM 2.5 MG PO TABS: ORAL_TABLET | ORAL | Status: DC

## 2013-09-05 ENCOUNTER — Telehealth: Payer: Self-pay | Admitting: General Practice

## 2013-09-05 NOTE — Telephone Encounter (Signed)
LMOM

## 2013-09-15 ENCOUNTER — Ambulatory Visit (INDEPENDENT_AMBULATORY_CARE_PROVIDER_SITE_OTHER): Payer: Medicare Other | Admitting: General Practice

## 2013-09-15 ENCOUNTER — Other Ambulatory Visit: Payer: Self-pay | Admitting: General Practice

## 2013-09-15 DIAGNOSIS — Z5181 Encounter for therapeutic drug level monitoring: Secondary | ICD-10-CM

## 2013-09-15 DIAGNOSIS — Z7901 Long term (current) use of anticoagulants: Secondary | ICD-10-CM

## 2013-09-15 DIAGNOSIS — I2699 Other pulmonary embolism without acute cor pulmonale: Secondary | ICD-10-CM

## 2013-09-15 LAB — POCT INR: INR: 1.9

## 2013-09-15 MED ORDER — WARFARIN SODIUM 2.5 MG PO TABS: ORAL_TABLET | ORAL | Status: DC

## 2013-09-15 NOTE — Progress Notes (Signed)
Pre visit review using our clinic review tool, if applicable. No additional management support is needed unless otherwise documented below in the visit note. 

## 2013-11-24 ENCOUNTER — Ambulatory Visit (INDEPENDENT_AMBULATORY_CARE_PROVIDER_SITE_OTHER): Payer: Medicare Other | Admitting: General Practice

## 2013-11-24 ENCOUNTER — Other Ambulatory Visit: Payer: Self-pay | Admitting: General Practice

## 2013-11-24 DIAGNOSIS — I2699 Other pulmonary embolism without acute cor pulmonale: Secondary | ICD-10-CM

## 2013-11-24 DIAGNOSIS — Z5181 Encounter for therapeutic drug level monitoring: Secondary | ICD-10-CM

## 2013-11-24 DIAGNOSIS — Z7901 Long term (current) use of anticoagulants: Secondary | ICD-10-CM

## 2013-11-24 LAB — POCT INR: INR: 1.9

## 2013-11-24 MED ORDER — WARFARIN SODIUM 2.5 MG PO TABS: ORAL_TABLET | ORAL | Status: DC

## 2013-11-24 NOTE — Progress Notes (Signed)
Pre visit review using our clinic review tool, if applicable. No additional management support is needed unless otherwise documented below in the visit note. 

## 2013-12-01 ENCOUNTER — Telehealth: Payer: Self-pay

## 2013-12-01 NOTE — Telephone Encounter (Signed)
The patient is hoping to est with Dr.Leschber.  She canceled two new pt appointments with Dr.Leschber due to "those days it was raining outside". I explained several times that Dr.Leschber was not taking on new patients, however, the patient insisted that she be asked.  I offered her several appointments with Dr.Kollar, which she refused.

## 2013-12-01 NOTE — Telephone Encounter (Signed)
Sorry, no - Thanks for asking

## 2013-12-04 NOTE — Telephone Encounter (Signed)
Notified pt of provider response.

## 2014-01-05 ENCOUNTER — Ambulatory Visit (INDEPENDENT_AMBULATORY_CARE_PROVIDER_SITE_OTHER): Payer: Medicare Other | Admitting: General Practice

## 2014-01-05 ENCOUNTER — Other Ambulatory Visit: Payer: Self-pay | Admitting: General Practice

## 2014-01-05 DIAGNOSIS — I2699 Other pulmonary embolism without acute cor pulmonale: Secondary | ICD-10-CM

## 2014-01-05 DIAGNOSIS — Z5181 Encounter for therapeutic drug level monitoring: Secondary | ICD-10-CM

## 2014-01-05 DIAGNOSIS — Z7901 Long term (current) use of anticoagulants: Secondary | ICD-10-CM

## 2014-01-05 LAB — POCT INR: INR: 2.4

## 2014-01-05 MED ORDER — WARFARIN SODIUM 2.5 MG PO TABS: ORAL_TABLET | ORAL | Status: DC

## 2014-01-05 NOTE — Progress Notes (Signed)
Pre visit review using our clinic review tool, if applicable. No additional management support is needed unless otherwise documented below in the visit note. 

## 2014-03-07 ENCOUNTER — Ambulatory Visit (INDEPENDENT_AMBULATORY_CARE_PROVIDER_SITE_OTHER): Payer: Medicare Other | Admitting: *Deleted

## 2014-03-07 ENCOUNTER — Other Ambulatory Visit: Payer: Self-pay | Admitting: *Deleted

## 2014-03-07 DIAGNOSIS — I2699 Other pulmonary embolism without acute cor pulmonale: Secondary | ICD-10-CM

## 2014-03-07 DIAGNOSIS — Z7901 Long term (current) use of anticoagulants: Secondary | ICD-10-CM

## 2014-03-07 DIAGNOSIS — Z5181 Encounter for therapeutic drug level monitoring: Secondary | ICD-10-CM

## 2014-03-07 LAB — POCT INR: INR: 3.2

## 2014-03-07 MED ORDER — WARFARIN SODIUM 2.5 MG PO TABS: ORAL_TABLET | ORAL | Status: DC

## 2014-04-04 ENCOUNTER — Ambulatory Visit (INDEPENDENT_AMBULATORY_CARE_PROVIDER_SITE_OTHER): Payer: Medicare Other | Admitting: *Deleted

## 2014-04-04 DIAGNOSIS — Z5181 Encounter for therapeutic drug level monitoring: Secondary | ICD-10-CM

## 2014-04-04 DIAGNOSIS — I2699 Other pulmonary embolism without acute cor pulmonale: Secondary | ICD-10-CM

## 2014-04-04 LAB — POCT INR: INR: 2.5

## 2014-04-04 MED ORDER — WARFARIN SODIUM 2.5 MG PO TABS: ORAL_TABLET | ORAL | Status: DC

## 2014-05-23 ENCOUNTER — Ambulatory Visit (INDEPENDENT_AMBULATORY_CARE_PROVIDER_SITE_OTHER): Payer: Medicare Other

## 2014-05-23 DIAGNOSIS — Z5181 Encounter for therapeutic drug level monitoring: Secondary | ICD-10-CM

## 2014-05-23 DIAGNOSIS — I2699 Other pulmonary embolism without acute cor pulmonale: Secondary | ICD-10-CM

## 2014-05-23 DIAGNOSIS — Z7901 Long term (current) use of anticoagulants: Secondary | ICD-10-CM

## 2014-05-23 LAB — POCT INR: INR: 2.6

## 2014-06-08 ENCOUNTER — Ambulatory Visit: Payer: Medicare Other | Admitting: Internal Medicine

## 2014-06-26 ENCOUNTER — Telehealth: Payer: Self-pay | Admitting: Internal Medicine

## 2014-07-11 ENCOUNTER — Ambulatory Visit: Payer: Medicare Other

## 2014-07-11 DIAGNOSIS — I2699 Other pulmonary embolism without acute cor pulmonale: Secondary | ICD-10-CM

## 2014-07-11 DIAGNOSIS — Z7901 Long term (current) use of anticoagulants: Secondary | ICD-10-CM

## 2014-07-11 DIAGNOSIS — Z5181 Encounter for therapeutic drug level monitoring: Secondary | ICD-10-CM

## 2014-07-11 LAB — POCT INR: INR: 2.6

## 2014-08-28 ENCOUNTER — Other Ambulatory Visit: Payer: Self-pay | Admitting: General Practice

## 2014-08-28 ENCOUNTER — Ambulatory Visit (INDEPENDENT_AMBULATORY_CARE_PROVIDER_SITE_OTHER): Payer: Medicare Other | Admitting: General Practice

## 2014-08-28 DIAGNOSIS — I2699 Other pulmonary embolism without acute cor pulmonale: Secondary | ICD-10-CM

## 2014-08-28 DIAGNOSIS — Z7901 Long term (current) use of anticoagulants: Secondary | ICD-10-CM

## 2014-08-28 DIAGNOSIS — Z5181 Encounter for therapeutic drug level monitoring: Secondary | ICD-10-CM

## 2014-08-28 LAB — POCT INR: INR: 2.6

## 2014-08-28 MED ORDER — WARFARIN SODIUM 2.5 MG PO TABS: ORAL_TABLET | ORAL | Status: DC

## 2014-08-28 NOTE — Progress Notes (Signed)
Agree with plan 

## 2014-08-28 NOTE — Progress Notes (Signed)
Pre visit review using our clinic review tool, if applicable. No additional management support is needed unless otherwise documented below in the visit note. 

## 2014-08-31 ENCOUNTER — Telehealth: Payer: Self-pay

## 2014-09-03 NOTE — Telephone Encounter (Signed)
LVM with patient to confirm status of flu shot or to call the office for an apt to receive flu shot.

## 2014-09-27 ENCOUNTER — Encounter: Payer: BLUE CROSS/BLUE SHIELD | Admitting: Internal Medicine

## 2014-10-09 ENCOUNTER — Ambulatory Visit: Payer: Medicare Other

## 2014-10-16 ENCOUNTER — Ambulatory Visit (INDEPENDENT_AMBULATORY_CARE_PROVIDER_SITE_OTHER): Payer: Medicare Other | Admitting: General Practice

## 2014-10-16 DIAGNOSIS — Z7901 Long term (current) use of anticoagulants: Secondary | ICD-10-CM

## 2014-10-16 DIAGNOSIS — Z5181 Encounter for therapeutic drug level monitoring: Secondary | ICD-10-CM

## 2014-10-16 DIAGNOSIS — I2699 Other pulmonary embolism without acute cor pulmonale: Secondary | ICD-10-CM

## 2014-10-16 LAB — POCT INR: INR: 2.2

## 2014-10-16 NOTE — Progress Notes (Signed)
Pre visit review using our clinic review tool, if applicable. No additional management support is needed unless otherwise documented below in the visit note. 

## 2014-10-16 NOTE — Progress Notes (Signed)
Agree with plan 

## 2014-11-13 ENCOUNTER — Ambulatory Visit: Payer: Medicare Other

## 2014-11-27 ENCOUNTER — Ambulatory Visit (INDEPENDENT_AMBULATORY_CARE_PROVIDER_SITE_OTHER): Payer: Medicare Other | Admitting: *Deleted

## 2014-11-27 DIAGNOSIS — I2699 Other pulmonary embolism without acute cor pulmonale: Secondary | ICD-10-CM

## 2014-11-27 DIAGNOSIS — Z5181 Encounter for therapeutic drug level monitoring: Secondary | ICD-10-CM | POA: Diagnosis not present

## 2014-11-27 DIAGNOSIS — Z7901 Long term (current) use of anticoagulants: Secondary | ICD-10-CM

## 2014-11-27 LAB — POCT INR: INR: 2.7

## 2014-11-27 NOTE — Progress Notes (Signed)
Patient presented with bilateral lower leg edema with 2+ pitting in spots.  Stretched, shiny skin with some discoloration.  Patient was advised to see physician.  Appointment was offered for today but patient refused.  She states she will call back for an appointment tomorrow or if symptoms do not improve.  She needs to coordinate a ride to come back.

## 2014-11-27 NOTE — Progress Notes (Signed)
Pre visit review using our clinic review tool, if applicable. No additional management support is needed unless otherwise documented below in the visit note. 

## 2014-11-28 ENCOUNTER — Observation Stay (HOSPITAL_COMMUNITY)
Admission: EM | Admit: 2014-11-28 | Discharge: 2014-11-30 | Disposition: A | Discharge status: Home / Self Care | Payer: Medicare Other | Attending: Internal Medicine | Admitting: Internal Medicine

## 2014-11-28 ENCOUNTER — Encounter (HOSPITAL_COMMUNITY): Payer: Self-pay | Admitting: *Deleted

## 2014-11-28 ENCOUNTER — Observation Stay (HOSPITAL_COMMUNITY): Payer: Medicare Other

## 2014-11-28 ENCOUNTER — Telehealth: Payer: Self-pay | Admitting: Internal Medicine

## 2014-11-28 ENCOUNTER — Emergency Department (HOSPITAL_COMMUNITY): Payer: Medicare Other

## 2014-11-28 DIAGNOSIS — G20 Parkinson's disease: Secondary | ICD-10-CM | POA: Insufficient documentation

## 2014-11-28 DIAGNOSIS — I878 Other specified disorders of veins: Secondary | ICD-10-CM | POA: Insufficient documentation

## 2014-11-28 DIAGNOSIS — R2243 Localized swelling, mass and lump, lower limb, bilateral: Secondary | ICD-10-CM | POA: Diagnosis not present

## 2014-11-28 DIAGNOSIS — R531 Weakness: Secondary | ICD-10-CM

## 2014-11-28 DIAGNOSIS — L89609 Pressure ulcer of unspecified heel, unspecified stage: Secondary | ICD-10-CM

## 2014-11-28 DIAGNOSIS — Z7901 Long term (current) use of anticoagulants: Secondary | ICD-10-CM | POA: Diagnosis not present

## 2014-11-28 DIAGNOSIS — Z86711 Personal history of pulmonary embolism: Secondary | ICD-10-CM | POA: Diagnosis not present

## 2014-11-28 DIAGNOSIS — I5032 Chronic diastolic (congestive) heart failure: Secondary | ICD-10-CM | POA: Diagnosis not present

## 2014-11-28 DIAGNOSIS — R079 Chest pain, unspecified: Secondary | ICD-10-CM | POA: Diagnosis not present

## 2014-11-28 DIAGNOSIS — N179 Acute kidney failure, unspecified: Secondary | ICD-10-CM | POA: Diagnosis not present

## 2014-11-28 DIAGNOSIS — R609 Edema, unspecified: Secondary | ICD-10-CM | POA: Diagnosis not present

## 2014-11-28 DIAGNOSIS — I509 Heart failure, unspecified: Secondary | ICD-10-CM | POA: Diagnosis not present

## 2014-11-28 DIAGNOSIS — I872 Venous insufficiency (chronic) (peripheral): Secondary | ICD-10-CM

## 2014-11-28 DIAGNOSIS — R109 Unspecified abdominal pain: Secondary | ICD-10-CM | POA: Diagnosis present

## 2014-11-28 DIAGNOSIS — R0602 Shortness of breath: Secondary | ICD-10-CM | POA: Diagnosis not present

## 2014-11-28 DIAGNOSIS — L89601 Pressure ulcer of unspecified heel, stage 1: Secondary | ICD-10-CM

## 2014-11-28 HISTORY — DX: Heart failure, unspecified: I50.9

## 2014-11-28 HISTORY — DX: Parkinson's disease: G20

## 2014-11-28 HISTORY — DX: Other specified disorders of veins: I87.8

## 2014-11-28 HISTORY — DX: Parkinson's disease without dyskinesia, without mention of fluctuations: G20.A1

## 2014-11-28 HISTORY — DX: Personal history of pulmonary embolism: Z86.711

## 2014-11-28 LAB — BRAIN NATRIURETIC PEPTIDE: B NATRIURETIC PEPTIDE 5: 238 pg/mL — AB (ref 0.0–100.0)

## 2014-11-28 LAB — BASIC METABOLIC PANEL
Anion gap: 8 (ref 5–15)
BUN: 17 mg/dL (ref 6–20)
CO2: 27 mmol/L (ref 22–32)
CREATININE: 1.2 mg/dL — AB (ref 0.44–1.00)
Calcium: 9 mg/dL (ref 8.9–10.3)
Chloride: 106 mmol/L (ref 101–111)
GFR calc Af Amer: 49 mL/min — ABNORMAL LOW (ref >60)
GFR, EST NON AFRICAN AMERICAN: 42 mL/min — AB (ref >60)
Glucose, Bld: 94 mg/dL (ref 65–99)
Potassium: 4.1 mmol/L (ref 3.5–5.1)
SODIUM: 141 mmol/L (ref 135–145)

## 2014-11-28 LAB — CBC WITH DIFFERENTIAL/PLATELET
BASOS ABS: 0 10*3/uL (ref 0.0–0.1)
Basophils Relative: 0 % (ref 0–1)
EOS PCT: 1 % (ref 0–5)
Eosinophils Absolute: 0.1 10*3/uL (ref 0.0–0.7)
HCT: 38.9 % (ref 36.0–46.0)
Hemoglobin: 12.7 g/dL (ref 12.0–15.0)
LYMPHS ABS: 1.3 10*3/uL (ref 0.7–4.0)
LYMPHS PCT: 12 % (ref 12–46)
MCH: 29.1 pg (ref 26.0–34.0)
MCHC: 32.6 g/dL (ref 30.0–36.0)
MCV: 89 fL (ref 78.0–100.0)
MONO ABS: 0.9 10*3/uL (ref 0.1–1.0)
Monocytes Relative: 8 % (ref 3–12)
NEUTROS ABS: 8.1 10*3/uL — AB (ref 1.7–7.7)
Neutrophils Relative %: 79 % — ABNORMAL HIGH (ref 43–77)
Platelets: 219 10*3/uL (ref 150–400)
RBC: 4.37 MIL/uL (ref 3.87–5.11)
RDW: 13.2 % (ref 11.5–15.5)
WBC: 10.3 10*3/uL (ref 4.0–10.5)

## 2014-11-28 LAB — PROTIME-INR
INR: 2.54 — ABNORMAL HIGH (ref 0.00–1.49)
PROTHROMBIN TIME: 27 s — AB (ref 11.6–15.2)

## 2014-11-28 LAB — TROPONIN I

## 2014-11-28 MED ORDER — ONDANSETRON HCL 4 MG/2ML IJ SOLN: 4.0000 mg | Freq: Four times a day (QID) | INTRAMUSCULAR | Status: DC | PRN

## 2014-11-28 MED ORDER — SODIUM CHLORIDE 0.9 % IJ SOLN
3.0000 mL | Freq: Two times a day (BID) | INTRAMUSCULAR | Status: DC
  Administered 2014-11-28 – 2014-11-29 (×3): 3 mL via INTRAVENOUS

## 2014-11-28 MED ORDER — WARFARIN SODIUM 5 MG PO TABS
2.5000 mg | ORAL_TABLET | Freq: Once | ORAL | Status: AC
  Administered 2014-11-28: 2.5 mg via ORAL
  Filled 2014-11-28: qty 1

## 2014-11-28 MED ORDER — SODIUM CHLORIDE 0.9 % IJ SOLN
3.0000 mL | Freq: Two times a day (BID) | INTRAMUSCULAR | Status: DC
  Administered 2014-11-29: 3 mL via INTRAVENOUS

## 2014-11-28 MED ORDER — ACETAMINOPHEN 650 MG RE SUPP: 650.0000 mg | Freq: Four times a day (QID) | RECTAL | Status: DC | PRN

## 2014-11-28 MED ORDER — SODIUM CHLORIDE 0.9 % IV SOLN: 250.0000 mL | INTRAVENOUS | Status: DC | PRN

## 2014-11-28 MED ORDER — ONDANSETRON HCL 4 MG PO TABS: 4.0000 mg | ORAL_TABLET | Freq: Four times a day (QID) | ORAL | Status: DC | PRN

## 2014-11-28 MED ORDER — ASPIRIN 81 MG PO CHEW
324.0000 mg | CHEWABLE_TABLET | Freq: Once | ORAL | Status: DC
  Filled 2014-11-28: qty 4

## 2014-11-28 MED ORDER — WARFARIN - PHARMACIST DOSING INPATIENT: Freq: Every day | Status: DC

## 2014-11-28 MED ORDER — METHOCARBAMOL 500 MG PO TABS: 750.0000 mg | ORAL_TABLET | Freq: Three times a day (TID) | ORAL | Status: DC | PRN

## 2014-11-28 MED ORDER — TORSEMIDE 20 MG PO TABS
20.0000 mg | ORAL_TABLET | Freq: Every day | ORAL | Status: DC
  Administered 2014-11-29: 20 mg via ORAL
  Filled 2014-11-28: qty 1

## 2014-11-28 MED ORDER — SODIUM CHLORIDE 0.9 % IJ SOLN: 3.0000 mL | INTRAMUSCULAR | Status: DC | PRN

## 2014-11-28 MED ORDER — ACETAMINOPHEN 500 MG PO TABS
1000.0000 mg | ORAL_TABLET | Freq: Three times a day (TID) | ORAL | Status: DC | PRN
  Administered 2014-11-28: 1000 mg via ORAL
  Administered 2014-11-29: 500 mg via ORAL
  Filled 2014-11-28 (×2): qty 2

## 2014-11-28 NOTE — ED Provider Notes (Signed)
TIME SEEN: 3:50 PM  CHIEF COMPLAINT: Chest pain, shortness of breath, lower extreme pain  HPI: Pt is a 80 y.o. female with history of CHF, pulmonary embolus on Coumadin, Parkinson's who presents to the emergency department with complaints of chest pain, shortness of breath and lower extreme swelling. Reports lower extreme swelling has been present for several days and she has progressively had shortness of breath is worse with exertion. Notice chest pressure that started last night and is intermittent and has occurred at rest. Also having sharp left lateral chest pain that is worse with palpation but also with deep inspiration. She states that she did not have pain or shortness of breath with her pulmonary embolus but had a syncopal event. No syncope recently. She is not on a diaphoretic so she states that they cause her throat to swell. Denies fever, cough. Is having leg pain bilaterally. Does not have a primary care physician or cardiologist in this area. States she recently moved back to [place].  ROS: See HPI Constitutional: no fever  Eyes: no drainage  ENT: no runny nose   Cardiovascular:   chest pain  Resp:  SOB  GI: no vomiting GU: no dysuria Integumentary: no rash  Allergy: no hives  Musculoskeletal:  leg swelling  Neurological: no slurred speech ROS otherwise negative  PAST MEDICAL HISTORY/PAST SURGICAL HISTORY:  Past Medical History  Diagnosis Date  . CHF (congestive heart failure)   . Venous stasis     MEDICATIONS:  Prior to Admission medications   Medication Sig Start Date End Date Taking? Authorizing Provider  acetaminophen (TYLENOL) 500 MG tablet Take 500 mg by mouth every 6 (six) hours as needed for mild pain or moderate pain.   Yes Historical Provider, MD  warfarin (COUMADIN) 2.5 MG tablet Take as directed by anticoagulation clinic Patient taking differently: Take 2.5-3.75 mg by mouth every evening. Take ONE TABLET EVERY EVENING, EXCEPT TAKE ONE AND ONE-HALF TABLET  ON FRIDAYS 08/28/14  Yes Judie Bonus, MD    ALLERGIES:  Allergies  Allergen Reactions  . Contrast Media [Iodinated Diagnostic Agents] Anaphylaxis and Shortness Of Breath  . Vancomycin Shortness Of Breath    REACTION: SOB  . Vioxx [Rofecoxib]     REACTION: irregular heartbeat    SOCIAL HISTORY:  History  Substance Use Topics  . Smoking status: Never Smoker   . Smokeless tobacco: Not on file  . Alcohol Use: No    FAMILY HISTORY: No family history on file.  EXAM: BP 157/77 mmHg  Pulse 80  Temp(Src) 98.5 F (36.9 C) (Oral)  Resp 15  Ht 5' 4.5" (1.638 m)  Wt 140 lb (63.504 kg)  BMI 23.67 kg/m2  SpO2 98% CONSTITUTIONAL: Alert and oriented and responds appropriately to questions. Elderly, in no distress, not hypoxic, speaking full sentences HEAD: Normocephalic EYES: Conjunctivae clear, PERRL ENT: normal nose; no rhinorrhea; moist mucous membranes; pharynx without lesions noted NECK: Supple, no meningismus, no LAD; patient does have mild JVD CARD: RRR; S1 and S2 appreciated; no murmurs, no clicks, no rubs, no gallops RESP: Normal chest excursion without splinting or tachypnea; breath sounds clear and equal bilaterally; no wheezes, no rhonchi, no rales, no hypoxia or respiratory distress, speaking full sentences; left chest walls melatonin palpation without crepitus or ecchymosis or deformity or any lesions or rash ABD/GI: Normal bowel sounds; non-distended; soft, non-tender, no rebound, no guarding, no peritoneal signs BACK:  The back appears normal and is non-tender to palpation, there is no CVA tenderness EXT: Normal ROM  in all joints; non-tender to palpation; bilateral lower extremity pitting edema to the level of the knee; normal capillary refill; no cyanosis SKIN: Normal color for age and race; warm NEURO: Moves all extremities equally, sensation to light touch intact diffusely, cranial nerves II through XII intact, patient does have a resting tremor worse in the right  upper extremity PSYCH: The patient's mood and manner are appropriate. Grooming and personal hygiene are appropriate.  MEDICAL DECISION MAKING: Patient here with chest pain, shortness of breath and lower extremity swelling. She refuses any diuretics given reported history of throat swelling.  We'll keep her legs elevated. EKG shows no new ischemic changes but is limited secondary to artifact from her tremor from her Parkinson's. We'll obtain cardiac labs, chest x-ray. We'll also obtain VQ scan to evaluate for pulmonary embolus and bilateral venous Dopplers. I feel she will need admission.  ED PROGRESS: Patient's labs show negative troponin. BNP mildly elevated at 238. Chest x-ray clear. Discussed with Dr. Konrad DoloresMerrell with hospitalist service for admission for chest pain rule out. Venous Doppler and VQ scan pending.      EKG Interpretation  Date/Time:  Wednesday November 28 2014 16:11:42 EDT Ventricular Rate:  79 PR Interval:  144 QRS Duration: 110 QT Interval:  412 QTC Calculation: 472 R Axis:   -64 Text Interpretation:  Sinus tachycardia Paired ventricular premature complexes Consider right atrial enlargement Left anterior fascicular block Low voltage, precordial leads Consider anterior infarct Artifact in lead(s) I II III aVR aVL aVF No significant change since last tracing Confirmed by WARD,  DO, KRISTEN [phone]) on 11/28/2014 4:54:10 PM        Layla MawKristen N Ward, DO 11/28/14 1744

## 2014-11-28 NOTE — Progress Notes (Signed)
ANTICOAGULATION CONSULT NOTE - Initial Consult  Pharmacy Consult for Coumadin (chronic Rx PTA) Indication: H/O PE  Allergies  Allergen Reactions  . Contrast Media [Iodinated Diagnostic Agents] Anaphylaxis and Shortness Of Breath  . Vancomycin Shortness Of Breath    REACTION: SOB  . Lasix [Furosemide]   . Vioxx [Rofecoxib]     REACTION: irregular heartbeat    Patient Measurements: Height: 5' 4.5" (163.8 cm) Weight: 159 lb 6.3 oz (72.3 kg) IBW/kg (Calculated) : 55.85  Vital Signs: Temp: 98.5 F (36.9 C) (06/08 1822) Temp Source: Oral (06/08 1822) BP: 140/85 mmHg (06/08 1822) Pulse Rate: 82 (06/08 1822)  Labs:  Recent Labs  11/27/14 1547 11/28/14 1617 11/28/14 1833  HGB  --  12.7  --   HCT  --  38.9  --   PLT  --  219  --   LABPROT  --   --  27.0*  INR 2.7  --  2.54*  CREATININE  --  1.20*  --   TROPONINI  --  <0.03  --     Estimated Creatinine Clearance: 38.1 mL/min (by C-G formula based on Cr of 1.2).   Medical History: Past Medical History  Diagnosis Date  . CHF (congestive heart failure)   . Venous stasis   . Parkinson's disease   . History of pulmonary embolism     Medications:  Prescriptions prior to admission  Medication Sig Dispense Refill Last Dose  . acetaminophen (TYLENOL) 500 MG tablet Take 500 mg by mouth every 6 (six) hours as needed for mild pain or moderate pain.   11/27/2014 at Unknown time  . warfarin (COUMADIN) 2.5 MG tablet Take as directed by anticoagulation clinic (Patient taking differently: Take 2.5-3.75 mg by mouth every evening. Take ONE TABLET EVERY EVENING, EXCEPT TAKE ONE AND ONE-HALF TABLET ON FRIDAYS) 105 tablet 1 11/27/2014 at 2100    Assessment: 80yo female on chronic Coumadin PTA.  Home dose listed above.  INR therapeutic on admission.  Goal of Therapy:  INR 2-3 Monitor platelets by anticoagulation protocol: Yes   Plan:  Coumadin 2.5mg  po tonight x 1 (home dose) INR daily  Valrie HartHall, Keyara Ent A 11/28/2014,7:32 PM

## 2014-11-28 NOTE — Telephone Encounter (Signed)
Patient states its important she speak with you.  Would not tell me why she was calling.

## 2014-11-28 NOTE — ED Notes (Signed)
Pt comes in by EMS from home for left sides flank pain that started yesterday. Also, pt has tremors that have worsened over the last day. Pt has swelling in her legs and is having some SOB. Pt is alert and oriented at this time with NAD noted.

## 2014-11-28 NOTE — H&P (Signed)
[facility] History and Physical  [name] ZOX:[id number] DOB: 27-Mar-1935 DOA: 11/28/2014  Referring physician: Dr. Elesa Massed - [facility] PCP: Judie Bonus, MD   Chief Complaint: side pain.    HPI: [name] is a 80 y.o. female  L Side pain. Started last night. Achy and sharp in nature. Worse w/ certain movements. Comes and goes.   Intermittent SOB. Baseline per pt. comes on multiple times per day.  bilat LE swelling. Started 2 wks ago. Constant. Getting worse. Elevation w/o improvement. Patient has had this problem in the past. She was once given Lasix but she developed shortness of breath with this.  Denies fevers, nausea, vomiting, diaphoresis, lightheadedness, headache, LOC, dysuria, frequency.  Patient has had progressive generalized weakness.  Review of Systems:  Constitutional: No weight loss, night sweats, Fevers, chills, HEENT:  No headaches, Difficulty swallowing,Tooth/dental problems,Sore throat,  No sneezing, itching, ear ache, nasal congestion, post nasal drip,  Cardio-vascular:  No chest pain, Orthopnea, PND, swelling in lower extremities, anasarca, dizziness, GI:  No heartburn, indigestion, abdominal pain, nausea, vomiting, diarrhea, change in bowel habits, loss of appetite  Resp: Per HPI Skin: Color change of LE bilat no rash or lesions.  GU:  no dysuria, change in color of urine, no urgency or frequency. No flank pain.  Musculoskeletal:  Chronic MSK pain and scoliosis Psych:  No change in mood or affect. No depression or anxiety. No memory loss.   Past Medical History  Diagnosis Date  . CHF (congestive heart failure)   . Venous stasis   . Parkinson's disease   . History of pulmonary embolism    History reviewed. No pertinent past surgical history. Social History:  reports that she has never smoked. She does not have any smokeless tobacco history on file. She reports that she does not drink alcohol or use illicit drugs.  Allergies    Allergen Reactions  . Contrast Media [Iodinated Diagnostic Agents] Anaphylaxis and Shortness Of Breath  . Vancomycin Shortness Of Breath    REACTION: SOB  . Lasix [Furosemide]   . Vioxx [Rofecoxib]     REACTION: irregular heartbeat    Family History  Problem Relation Age of Onset  . Heart disease Mother     90s     Prior to Admission medications   Medication Sig Start Date End Date Taking? Authorizing Provider  acetaminophen (TYLENOL) 500 MG tablet Take 500 mg by mouth every 6 (six) hours as needed for mild pain or moderate pain.   Yes Historical Provider, MD  warfarin (COUMADIN) 2.5 MG tablet Take as directed by anticoagulation clinic Patient taking differently: Take 2.5-3.75 mg by mouth every evening. Take ONE TABLET EVERY EVENING, EXCEPT TAKE ONE AND ONE-HALF TABLET ON FRIDAYS 08/28/14  Yes Judie Bonus, MD   Physical Exam: Filed Vitals:   11/28/14 1500 11/28/14 1530 11/28/14 1600 11/28/14 1822  BP: 157/77 162/79 151/74 140/85  Pulse:   85 82  Temp:    98.5 F (36.9 C)  TempSrc:    Oral  Resp: Height:    5' 4.5" (1.638 m)  Weight:    72.3 kg (159 lb 6.3 oz)  SpO2:   95% 100%    Wt Readings from Last 3 Encounters:  11/28/14 72.3 kg (159 lb 6.3 oz)  05/01/13 68.04 kg (150 lb)  03/01/09 73.143 kg (161 lb 4 oz)    General:  Appears calm and comfortable Eyes:  PERRL, normal lids, irises & conjunctiva ENT:  grossly normal hearing, lips & tongue Neck:  no LAD, masses or thyromegaly Cardiovascular:  RRR, no m/r/g. 3+ Bilat LE pitting edema Respiratory:  CTA bilaterally, no w/r/r. Normal respiratory effort. Abdomen:  soft, ntnd Skin: Hyperpigmented lower extremities. Musculoskeletal: Scoliosis, grossly normal tone BUE/BLE Psychiatric:  grossly normal mood and affect, speech fluent and appropriate Neurologic:  grossly non-focal.          Labs on Admission:  Basic Metabolic Panel:  Recent Labs Lab 11/28/14 1617  NA 141  K 4.1  CL 106  CO2 27   GLUCOSE 94  BUN 17  CREATININE 1.20*  CALCIUM 9.0   Liver Function Tests: No results for input(s): AST, ALT, ALKPHOS, BILITOT, PROT, ALBUMIN in the last 168 hours. No results for input(s): LIPASE, AMYLASE in the last 168 hours. No results for input(s): AMMONIA in the last 168 hours. CBC:  Recent Labs Lab 11/28/14 1617  WBC 10.3  NEUTROABS 8.1*  HGB 12.7  HCT 38.9  MCV 89.0  PLT 219   Cardiac Enzymes:  Recent Labs Lab 11/28/14 1617  TROPONINI <0.03    BNP (last 3 results)  Recent Labs  11/28/14 1617  BNP 238.0*    ProBNP (last 3 results) No results for input(s): PROBNP in the last 8760 hours.  CBG: No results for input(s): GLUCAP in the last 168 hours.  Radiological Exams on Admission: Dg Chest 1 View  11/28/2014   CLINICAL DATA:  Short of breath and weakness. Initial encounter. No injury to the chest or fever.  EXAM: CHEST  1 VIEW  COMPARISON:  08/12/2009.  FINDINGS: The superior chest on the RIGHT is obscured by the patient's head. The cardiopericardial silhouette appears within normal limits for projection. No airspace disease or pleural effusion. Monitoring leads project over the chest. Age related calcification of the tracheobronchial tree. Patient is rotated to the RIGHT. Linear scarring is present in the LEFT lower lobe, projected over the cardiopericardial silhouette.  IMPRESSION: No acute cardiopulmonary disease. Exam degraded by projection and patient position.   Electronically Signed   By: Andreas NewportGeoffrey  Lamke M.D.   On: 11/28/2014 16:44   [place] Venous Img Lower Bilateral  11/28/2014   CLINICAL DATA:  BILATERAL lower extremity swelling. History of pulmonary emboli. Symptoms for a few weeks.  EXAM: BILATERAL LOWER EXTREMITY VENOUS DOPPLER ULTRASOUND  TECHNIQUE: Gray-scale sonography with graded compression, as well as color Doppler and duplex ultrasound were performed to evaluate the lower extremity deep venous systems from the level of the common femoral vein and  including the common femoral, femoral, profunda femoral, popliteal and calf veins including the posterior tibial, peroneal and gastrocnemius veins when visible. The superficial great saphenous vein was also interrogated. Spectral Doppler was utilized to evaluate flow at rest and with distal augmentation maneuvers in the common femoral, femoral and popliteal veins.  COMPARISON:  None.  FINDINGS: RIGHT LOWER EXTREMITY  Common Femoral Vein: No evidence of thrombus. Normal compressibility, respiratory phasicity and response to augmentation.  Saphenofemoral Junction: No evidence of thrombus. Normal compressibility and flow on color Doppler imaging.  Profunda Femoral Vein: No evidence of thrombus. Normal compressibility and flow on color Doppler imaging.  Femoral Vein: No evidence of thrombus. Normal compressibility, respiratory phasicity and response to augmentation.  Popliteal Vein: No evidence of thrombus. Normal compressibility, respiratory phasicity and response to augmentation.  Calf Veins: No evidence of thrombus. Normal compressibility and flow on color Doppler imaging.  Superficial Great Saphenous Vein: No evidence of thrombus. Normal compressibility and flow on color  Doppler imaging.  Venous Reflux:  None.  Other Findings:  None.  LEFT LOWER EXTREMITY  Common Femoral Vein: No evidence of thrombus. Normal compressibility, respiratory phasicity and response to augmentation.  Saphenofemoral Junction: No evidence of thrombus. Normal compressibility and flow on color Doppler imaging.  Profunda Femoral Vein: No evidence of thrombus. Normal compressibility and flow on color Doppler imaging.  Femoral Vein: No evidence of thrombus. Normal compressibility, respiratory phasicity and response to augmentation.  Popliteal Vein: No evidence of thrombus. Normal compressibility, respiratory phasicity and response to augmentation.  Calf Veins: No evidence of thrombus. Normal compressibility and flow on color Doppler imaging.   Superficial Great Saphenous Vein: No evidence of thrombus. Normal compressibility and flow on color Doppler imaging.  Venous Reflux:  None.  Other Findings:  BILATERAL lower extremity edema.  IMPRESSION: No evidence of BILATERAL deep venous thrombosis.   Electronically Signed   By: Davonna Belling M.D.   On: 11/28/2014 18:33    EKG: Independently reviewed. Sinus, large amount of artifact, no overt sign of ACS  Assessment/Plan Principal Problem:   Chest pain Active Problems:   Venous (peripheral) insufficiency   Generalized weakness   LEG EDEMA   Pressure ulcer, heel   AKI (acute kidney injury)   Chronic diastolic congestive heart failure   Parkinson's disease   Chest pain: Likely musculoskeletal in nature as this is reproducible with inspiration and palpation of the area. Troponin negative. EKG without overt sign of ACS. CXR without evidence of pneumonia or rib fracture. Unlikely due to PE given negative Dopplers and patient being anticoagulated with INR of 2.7.  - Cycle troponins - Tele - EKG in a.m. - Cancel VQ scan - Tylenol 1gm Q8 - Robaxin  Lower extremity swelling: Likely secondary to venous insufficiency and mild CHF. Venous Dopplers performed in the ED without evidence of DVT. - Torsemide - Compression stockings  Diastolic congestive heart failure: Last Echo at [facility] w/ EF >55% Grade 1 Diastolic dysfunction. BNP 238. No home diuretics. Patient does not have a cardiologist. - Diuresis as above - Repeat echo (may be done as outpatient if patient ready for discharge prior to being performed.)  H/o PE: on lifelong anticoagulation. INR 2.7 on 11/27/14 - continue coumadin  AK I: Creatinine 1.2. Baseline 0.9. Unsure if this is chronic ongoing C KD versus AK I due to cardiorenal syndrome. - BMET in am  Pressure Ulcers: bilat heels. Stage I - space boots  Generalized weakness: - PT/OT  Parkinson's: Patient is followed by [facility] for this. Her tremors at baseline. She  is currently not on any medications. - Follow-up with outpatient team   Code Status: FULL DVT Prophylaxis: on coumadin Family Communication: None Disposition Plan: pending improvement     MERRELL, DAVID Shela Commons, MD Family Medicine [facility] [email or web address] Password [email or web address]

## 2014-11-28 NOTE — Progress Notes (Signed)
I have reviewed and agree with the plan. 

## 2014-11-29 ENCOUNTER — Observation Stay (HOSPITAL_COMMUNITY): Payer: Medicare Other

## 2014-11-29 DIAGNOSIS — R079 Chest pain, unspecified: Secondary | ICD-10-CM | POA: Diagnosis not present

## 2014-11-29 DIAGNOSIS — G20 Parkinson's disease: Secondary | ICD-10-CM | POA: Diagnosis not present

## 2014-11-29 DIAGNOSIS — R531 Weakness: Secondary | ICD-10-CM

## 2014-11-29 DIAGNOSIS — R0602 Shortness of breath: Secondary | ICD-10-CM | POA: Diagnosis not present

## 2014-11-29 DIAGNOSIS — R609 Edema, unspecified: Secondary | ICD-10-CM | POA: Diagnosis not present

## 2014-11-29 LAB — BASIC METABOLIC PANEL
Anion gap: 9 (ref 5–15)
BUN: 17 mg/dL (ref 6–20)
CALCIUM: 8.4 mg/dL — AB (ref 8.9–10.3)
CO2: 26 mmol/L (ref 22–32)
CREATININE: 0.81 mg/dL (ref 0.44–1.00)
Chloride: 108 mmol/L (ref 101–111)
GFR calc non Af Amer: >60 mL/min (ref >60)
Glucose, Bld: 95 mg/dL (ref 65–99)
Potassium: 3.7 mmol/L (ref 3.5–5.1)
Sodium: 143 mmol/L (ref 135–145)

## 2014-11-29 LAB — PROTIME-INR
INR: 2.69 — ABNORMAL HIGH (ref 0.00–1.49)
Prothrombin Time: 28.2 seconds — ABNORMAL HIGH (ref 11.6–15.2)

## 2014-11-29 LAB — CBC
HCT: 37.7 % (ref 36.0–46.0)
Hemoglobin: 12.3 g/dL (ref 12.0–15.0)
MCH: 29.1 pg (ref 26.0–34.0)
MCHC: 32.6 g/dL (ref 30.0–36.0)
MCV: 89.3 fL (ref 78.0–100.0)
Platelets: 209 10*3/uL (ref 150–400)
RBC: 4.22 MIL/uL (ref 3.87–5.11)
RDW: 13.3 % (ref 11.5–15.5)
WBC: 8 10*3/uL (ref 4.0–10.5)

## 2014-11-29 LAB — TROPONIN I
Troponin I: <0.03 ng/mL (ref <0.031)
Troponin I: <0.03 ng/mL (ref <0.031)

## 2014-11-29 MED ORDER — WARFARIN - PHARMACIST DOSING INPATIENT
Status: DC
  Administered 2014-11-29: 1

## 2014-11-29 MED ORDER — WARFARIN SODIUM 5 MG PO TABS
2.5000 mg | ORAL_TABLET | Freq: Once | ORAL | Status: AC
  Administered 2014-11-29: 2.5 mg via ORAL
  Filled 2014-11-29: qty 1

## 2014-11-29 MED ORDER — TORSEMIDE 20 MG PO TABS
20.0000 mg | ORAL_TABLET | Freq: Two times a day (BID) | ORAL | Status: DC
  Administered 2014-11-29: 20 mg via ORAL
  Filled 2014-11-29 (×3): qty 1

## 2014-11-29 NOTE — BH Assessment (Addendum)
Tele Assessment Note   [name] is an 80 y.o. female. Pt is on the medical floor for heart problems. The physician put in a request for the Pt to be seen because the Pt would not provide information to the SW about her D/C. The Pt denies SI/HI. Pt denies AVH. Pt was oriented. 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 Pt states that she has a large family who also checks and monitors her health. Pt denies previous mental health history. Pt denies SA.  Pt was calm and cooperative throughout assessment.  Writer consulted with Dr. Elna Breslow. Per Dr. Elna Breslow the Pt is psychologically cleared. Pt can be D/C after being medically cleared.   Axis I: Adjustment Disorder NOS Axis II: Deferred Axis III:  Past Medical History  Diagnosis Date  . CHF (congestive heart failure)   . Venous stasis   . Parkinson's disease   . History of pulmonary embolism    Axis IV: other psychosocial or environmental problems Axis V: 61-70 mild symptoms  Past Medical History:  Past Medical History  Diagnosis Date  . CHF (congestive heart failure)   . Venous stasis   . Parkinson's disease   . History of pulmonary embolism     History reviewed. No pertinent past surgical history.  Family History:  Family History  Problem Relation Age of Onset  . Heart disease Mother     67s    Social History:  reports that she has never smoked. She does not have any smokeless tobacco history on file. She reports that she does not drink alcohol or use illicit drugs.  Additional Social History:  Alcohol / Drug Use Pain Medications: Please see Pt chart Prescriptions: Please see Pt chart Over the Counter: Pt denies History of alcohol / drug use?: No history of alcohol / drug abuse Longest period of sobriety (when/how long): NA  CIWA: CIWA-Ar BP: (!) 111/53  mmHg Pulse Rate: 83 COWS:    PATIENT STRENGTHS: (choose at least two) Average or above average intelligence Communication skills  Allergies:  Allergies  Allergen Reactions  . Contrast Media [Iodinated Diagnostic Agents] Anaphylaxis and Shortness Of Breath  . Vancomycin Shortness Of Breath    REACTION: SOB  . Lasix [Furosemide]   . Vioxx [Rofecoxib]     REACTION: irregular heartbeat    Home Medications:  Medications Prior to Admission  Medication Sig Dispense Refill  . acetaminophen (TYLENOL) 500 MG tablet Take 500 mg by mouth every 6 (six) hours as needed for mild pain or moderate pain.    Marland Kitchen warfarin (COUMADIN) 2.5 MG tablet Take as directed by anticoagulation clinic (Patient taking differently: Take 2.5-3.75 mg by mouth every evening. Take ONE TABLET EVERY EVENING, EXCEPT TAKE ONE AND ONE-HALF TABLET ON FRIDAYS) 105 tablet 1    OB/GYN Status:  No LMP recorded. Patient is postmenopausal.  General Assessment Data Location of Assessment: [facility] TTS Assessment: In system Is this a Tele or Face-to-Face Assessment?: Tele Assessment Is this an Initial Assessment or a Re-assessment for this encounter?: Initial Assessment Marital status: Single Maiden name: NA Is patient pregnant?: No Pregnancy Status: No Living Arrangements: Alone Can pt return to current living arrangement?: Yes Admission Status: Voluntary Is patient capable of signing voluntary admission?: Yes Referral Source: MD Insurance type: Medicare  Crisis Care Plan Living Arrangements: Alone Name of Psychiatrist: NA Name of Therapist: NA  Education Status Is patient currently in school?: No Current Grade: NA Highest grade of school patient has completed: 12 Name of school: NA Contact person: NA  Risk to self with the past 6 months Suicidal Ideation: No Has patient been a risk to self within the past 6 months prior to admission? : No Suicidal Intent: No Has patient had any suicidal  intent within the past 6 months prior to admission? : No Is patient at risk for suicide?: No Suicidal Plan?: No Has patient had any suicidal plan within the past 6 months prior to admission? : No Access to Means: No What has been your use of drugs/alcohol within the last 12 months?: NA Previous Attempts/Gestures: No How many times?: 0 Other Self Harm Risks: NA Triggers for Past Attempts: None known Intentional Self Injurious Behavior: None Family Suicide History: No Recent stressful life event(s):  (Pt denies) Persecutory voices/beliefs?: No Depression: No Depression Symptoms:  (Pt denies) Substance abuse history and/or treatment for substance abuse?: No Suicide prevention information given to non-admitted patients: Not applicable  Risk to Others within the past 6 months Homicidal Ideation: No Does patient have any lifetime risk of violence toward others beyond the six months prior to admission? : No Thoughts of Harm to Others: No Current Homicidal Intent: No Current Homicidal Plan: No Access to Homicidal Means: No Identified Victim: NA History of harm to others?: No Assessment of Violence: None Noted Violent Behavior Description: NA Does patient have access to weapons?: No Criminal Charges Pending?: No Does patient have a court date: No Is patient on probation?: No  Psychosis Hallucinations: None noted Delusions: None noted  Mental Status Report Appearance/Hygiene: Unremarkable Eye Contact: Good Motor Activity: Freedom of movement Speech: Logical/coherent Level of Consciousness: Alert Mood: Euthymic Affect: Appropriate to circumstance Anxiety Level: None Thought Processes: Coherent, Relevant Judgement: Unimpaired Orientation: Person, Place, Time, Situation, Appropriate for developmental age Obsessive Compulsive Thoughts/Behaviors: None  Cognitive Functioning Concentration: Normal Memory: Recent Intact, Remote Intact IQ: Average Insight: Good Impulse Control:  Good Appetite: Good Weight Loss: 0 Weight Gain: 0 Sleep: No Change Total Hours of Sleep: 7 Vegetative Symptoms: None  ADLScreening [facility] Assessment [facility]) Patient's cognitive ability adequate to safely complete daily activities?: Yes Patient able to express need for assistance with ADLs?: Yes Independently performs ADLs?: No  Prior Inpatient Therapy Prior Inpatient Therapy: No Prior Therapy Dates: NA Prior Therapy Facilty/Provider(s): NA Reason for Treatment: NA  Prior Outpatient Therapy Prior Outpatient Therapy: No Prior Therapy Dates: na Prior Therapy Facilty/Provider(s): na Reason for Treatment: na Does patient have an ACCT team?: No Does patient have Intensive In-House Services?  : No Does patient have [facility] services? : No Does patient have P4CC services?: No  ADL Screening (condition at time of admission) Patient's cognitive ability adequate to safely complete daily activities?: Yes Is the patient deaf or have difficulty hearing?: Yes Does the patient have difficulty seeing, even when wearing glasses/contacts?: Yes Does the patient have difficulty concentrating, remembering, or making decisions?: No Patient able to express need for assistance with ADLs?: Yes Does the patient have difficulty dressing or bathing?: Yes Independently performs ADLs?: No Communication: Needs assistance Dressing (OT): Needs assistance Is this a change from baseline?: Pre-admission baseline Grooming: Needs assistance Is this a change from baseline?: Pre-admission baseline Feeding: Needs assistance Bathing: Needs assistance Is this a change from baseline?: Pre-admission baseline Toileting: Needs assistance Is this a change from baseline?: Pre-admission baseline  In/Out Bed: Needs assistance Is this a change from baseline?: Pre-admission baseline Walks in Home: Needs assistance Is this a change from baseline?: Pre-admission baseline Does the patient have difficulty walking or climbing  stairs?: Yes Weakness of Legs: Both Weakness of Arms/Hands: None  Home Assistive Devices/Equipment Home Assistive Devices/Equipment: None  Therapy Consults (therapy consults require a physician order) PT Evaluation Needed: No OT Evalulation Needed: No SLP Evaluation Needed: No Abuse/Neglect Assessment (Assessment to be complete while patient is alone) Physical Abuse: Denies Verbal Abuse: Denies Sexual Abuse: Denies Exploitation of patient/patient's resources: Denies Self-Neglect: Denies Values / Beliefs Cultural Requests During Hospitalization: None Spiritual Requests During Hospitalization: None Consults Spiritual Care Consult Needed: No Social Work Consult Needed: No Merchant navy officer (For Healthcare) Does patient have an advance directive?: No Would patient like information on creating an advanced directive?: No - patient declined information Type of Advance Directive: Living will Does patient want to make changes to advanced directive?: No - Patient declined Copy of advanced directive(s) in chart?: No - copy requested Nutrition Screen- [facility] Adult/WL/AP Patient's home diet: Cardiac Has the patient recently lost weight without trying?: No Has the patient been eating poorly because of a decreased appetite?: No Malnutrition Screening Tool Score: 0  Additional Information 1:1 In Past 12 Months?: No CIRT Risk: No Elopement Risk: No Does patient have medical clearance?: No     Disposition:  Disposition Initial Assessment Completed for this Encounter: Yes Disposition of Patient: Other dispositions Other disposition(s): Other (Comment)  Einer Meals D 11/29/2014 6:33 PM

## 2014-11-29 NOTE — Progress Notes (Signed)
Patient has refused to be turned this shift or to have pillows placed under feet. Pink foams reapplied to heels for protection. Patient does not like to be moved in bed using sliding pads, but requested to be lifted from under her arms. Patient informed of our no lift policy at [facility]. Offered patient an overhead trapeze so she could move herself in bed, but she refused. Patient likes multiple towels tucked under her back to elevate her head, but refuses to use bed controls to elevate head. Patient refused tylenol to help with comfort. Patient does not like to discuss how she cares for herself at home. Stated it is personal and none of our business.

## 2014-11-29 NOTE — Progress Notes (Signed)
ANTICOAGULATION CONSULT NOTE - follow up  Pharmacy Consult for Coumadin (chronic Rx PTA) Indication: H/O PE  Allergies  Allergen Reactions  . Contrast Media [Iodinated Diagnostic Agents] Anaphylaxis and Shortness Of Breath  . Vancomycin Shortness Of Breath    REACTION: SOB  . Lasix [Furosemide]   . Vioxx [Rofecoxib]     REACTION: irregular heartbeat   Patient Measurements: Height: 5' 4.5" (163.8 cm) Weight: 159 lb 6.3 oz (72.3 kg) IBW/kg (Calculated) : 55.85  Vital Signs:    Labs:  Recent Labs  11/27/14 1547  11/28/14 1617 11/28/14 1833 11/28/14 1931 11/29/14 0049 11/29/14 0628  HGB  --   --  12.7  --   --   --  12.3  HCT  --   --  38.9  --   --   --  37.7  PLT  --   --  219  --   --   --  209  LABPROT  --   --   --  27.0*  --   --  28.2*  INR 2.7  --   --  2.54*  --   --  2.69*  CREATININE  --   --  1.20*  --   --   --  0.81  TROPONINI  --   < > <0.03  --  <0.03 <0.03 <0.03  < > = values in this interval not displayed.  Estimated Creatinine Clearance: 56.5 mL/min (by C-G formula based on Cr of 0.81).  Medical History: Past Medical History  Diagnosis Date  . CHF (congestive heart failure)   . Venous stasis   . Parkinson's disease   . History of pulmonary embolism    Medications:  Prescriptions prior to admission  Medication Sig Dispense Refill Last Dose  . acetaminophen (TYLENOL) 500 MG tablet Take 500 mg by mouth every 6 (six) hours as needed for mild pain or moderate pain.   11/27/2014 at Unknown time  . warfarin (COUMADIN) 2.5 MG tablet Take as directed by anticoagulation clinic (Patient taking differently: Take 2.5-3.75 mg by mouth every evening. Take ONE TABLET EVERY EVENING, EXCEPT TAKE ONE AND ONE-HALF TABLET ON FRIDAYS) 105 tablet 1 11/27/2014 at 2100   Assessment: 80yo female on chronic Coumadin PTA.  Home dose listed above.  INR therapeutic on admission.  Goal of Therapy:  INR 2-3 Monitor platelets by anticoagulation protocol: Yes   Plan:   Coumadin 2.5mg  po today x 1 (home dose) INR daily  Valrie Hart A 11/29/2014,11:01 AM

## 2014-11-29 NOTE — Evaluation (Addendum)
Occupational Therapy Evaluation Patient Details Name: [name] MRN: [id number] DOB: [birth year] Today's Date: 11/29/2014    History of Present Illness HPI: Pt is a 80 y.o. female with history of CHF, pulmonary embolus on Coumadin, Parkinson's who presents to the emergency department with complaints of chest pain, shortness of breath and lower extreme swelling. Reports lower extreme swelling has been present for several days and she has progressively had shortness of breath is worse with exertion. Notice chest pressure that started last night and is intermittent and has occurred at rest. Also having sharp left lateral chest pain that is worse with palpation but also with deep inspiration. She states that she did not have pain or shortness of breath with her pulmonary embolus but had a syncopal event. No syncope recently. She is not on a diaphoretic so she states that they cause her throat to swell. Denies fever, cough. Is having leg pain bilaterally. Does not have a primary care physician or cardiologist in this area. States she recently moved back to [place].   Clinical Impression   PTA pt lived at home, alone, aide worker came for 4 hours each day. Pt awake, alert, and calm this afternoon. Pt denies Parkinson's diagnosis and states she gets tremors when she is nervous or stressed. Pt states "when I am doing something else the tremors stop & they come back when I am being still", consistent with resting tremors. Pt reports she is able to complete all BADL tasks independently, which I believe is unlikely, however no one present to verify or contradict pt's report. Pt also reports she pays people to help her. Pt demonstrates good BUE range of motion (85%) and fair strength (4-/5). Pt appears to be at baseline with BADL tasks, requiring assistance for LB dressing and bathing at a minimum. Recommend SNF on discharge and recommend OT eval at SNF to further determine baseline function. No further acute  OT services required.     Follow Up Recommendations  SNF;Supervision/Assistance - 24 hour    Equipment Recommendations  Tub/shower seat;3 in 1 bedside comode       Precautions / Restrictions Precautions Precautions: Fall Precaution Comments: very tender lower calves and does not want anything touching them...assist her to move legs at her knees Restrictions Weight Bearing Restrictions: No              ADL Overall ADL's : Needs assistance/impaired;At baseline                                       General ADL Comments: Pt reports she is able to do everything herself (no caregiver/family available to verify this). Pt declines to sit up at EOB this afternoon.      Vision Vision Assessment?: Yes Eye Alignment: Within Functional Limits Ocular Range of Motion: Within Functional Limits Alignment/Gaze Preference: Within Defined Limits Tracking/Visual Pursuits: Able to track stimulus in all quads without difficulty Saccades: Within functional limits Convergence: Within functional limits Visual Fields: No apparent deficits          Pertinent Vitals/Pain Pain Assessment: No/denies pain     Hand Dominance Right   Extremity/Trunk Assessment Upper Extremity Assessment Upper Extremity Assessment: Generalized weakness ((4-/5))   Lower Extremity Assessment Lower Extremity Assessment: Generalized weakness (generalized rigidity of LEs with knee flexion only to 60 degrees)   Cervical / Trunk Assessment Cervical / Trunk Assessment: Kyphotic (head in full flexion)  Communication Communication Communication: No difficulties   Cognition Arousal/Alertness: Awake/alert Behavior During Therapy: WFL for tasks assessed/performed Overall Cognitive Status: Within Functional Limits for tasks assessed                                Home Living Family/patient expects to be discharged to:: Skilled nursing facility Living Arrangements: Alone                                       Prior Functioning/Environment Level of Independence: Needs assistance  Gait / Transfers Assistance Needed: Pt reports she uses a 4 wheeled walker for functional mobility tasks ADL's / Homemaking Assistance Needed: Pt reports she has an aide for 4 hours each day for IADL tasks        OT Diagnosis: Generalized weakness   OT Problem List: Decreased strength;Impaired balance (sitting and/or standing);Decreased cognition    End of Session    Activity Tolerance: Patient tolerated treatment well Patient left: in bed;with call bell/phone within reach;with bed alarm set    G-codes: Self-Care; clinical judgment Current: At least 80% but less than 100% impaired, limited, or restricted.  Goal: At least 80% but less than 100% impaired, limited, or restricted. Discharge: At least 80% but less than 100% impaired, limited, or restricted.  Time: 3354-5625 OT Time Calculation (min): 28 min Charges:  OT General Charges $OT Visit: 1 Procedure OT Evaluation $Initial OT Evaluation Tier I: 1 Procedure  Ezra Sites, OTR/L  [phone]  11/29/2014, 3:16 PM

## 2014-11-29 NOTE — Evaluation (Addendum)
Physical Therapy Evaluation Patient Details Name: [name] MRN: [id number] DOB: 02-22-1935 Today's Date: 11/29/2014   History of Present Illness  HPI: Pt is a 80 y.o. female with history of CHF, pulmonary embolus on Coumadin, Parkinson's who presents to the emergency department with complaints of chest pain, shortness of breath and lower extreme swelling. Reports lower extreme swelling has been present for several days and she has progressively had shortness of breath is worse with exertion. Notice chest pressure that started last night and is intermittent and has occurred at rest. Also having sharp left lateral chest pain that is worse with palpation but also with deep inspiration. She states that she did not have pain or shortness of breath with her pulmonary embolus but had a syncopal event. No syncope recently. She is not on a diaphoretic so she states that they cause her throat to swell. Denies fever, cough. Is having leg pain bilaterally. Does not have a primary care physician or cardiologist in this area. States she recently moved back to [place].  Clinical Impression   Pt was seen for evaluation.  She was alert and oriented, concerned about her LE edema and difficulty walking.  She presents the classic picture of a Parkinson's patient but states that she wants to believe that she doesn't have this disease.  She is not taking any Parkinson's meds as she understands that they all cause hallucinations.  She lives alone and has an aide for several hours/day.  She has been using a walker for gait but she states that this has become very difficult due to LE edema.  Today, she presents with severe rigidity throughout and requires mod to max assist to roll and transfer to EOB.  We were unable to get her to standing.due to weakness.  I am strongly recommending SNF at d/c.  Pt is agreeable.    Follow Up Recommendations SNF    Equipment Recommendations  None recommended by PT    Recommendations  for Other Services       Precautions / Restrictions Precautions Precautions: Fall Precaution Comments: very tender lower calves and does not want anything touching them...assist her to move legs at her knees Restrictions Weight Bearing Restrictions: No      Mobility  Bed Mobility Overal bed mobility: Needs Assistance Bed Mobility: Rolling;Supine to Sit Rolling: Max assist   Supine to sit: Max assist        Transfers                 General transfer comment: we were unable to assist pt to standing due to her inability to flex knees and her fear of falling  Ambulation/Gait             General Gait Details: unable to ambulate                   Balance Overall balance assessment: Needs assistance Sitting-balance support: Bilateral upper extremity supported;Feet supported Sitting balance-Leahy Scale: Poor Sitting balance - Comments: falls backward due to trunk rigidity                                     Pertinent Vitals/Pain Pain Assessment: No/denies pain    Home Living Family/patient expects to be discharged to:: Skilled nursing facility Living Arrangements: Alone                    Prior Function  Level of Independence: Needs assistance   Gait / Transfers Assistance Needed: pt ambulated with a 4 wheeled walker, very slowly per her report...she is able to transfer independently  ADL's / Homemaking Assistance Needed: assist needed with household activities                Extremity/Trunk Assessment               Lower Extremity Assessment: Generalized weakness (generalized rigidity of LEs with knee flexion only to 60 degrees)      Cervical / Trunk Assessment: Kyphotic (head in full flexion)  Communication   Communication: No difficulties  Cognition Arousal/Alertness: Awake/alert Behavior During Therapy: Agitated;Anxious Overall Cognitive Status: Within Functional Limits for tasks assessed                                     Assessment/Plan    PT Assessment All further PT needs can be met in the next venue of care  PT Diagnosis Generalized weakness;Difficulty walking (generalized rigidity)   PT Problem List Decreased strength;Decreased range of motion;Decreased activity tolerance;Decreased balance;Decreased mobility;Cardiopulmonary status limiting activity;Impaired tone  PT Treatment Interventions     PT Goals (Current goals can be found in the Care Plan section) Acute Rehab PT Goals PT Goal Formulation: All assessment and education complete, DC therapy         Barriers to discharge  lives alone                     End of Session Equipment Utilized During Treatment: Gait belt Activity Tolerance: Treatment limited secondary to agitation Patient left: in bed;with call bell/phone within reach;with nursing/sitter in room Nurse Communication: Mobility status   G Codes:              Clinical judgement                               Mobility                               Evaluation:  CL                               Goals:  CL                               Discharge:  CL                                       Time: 1200-1304 PT Time Calculation (min) (ACUTE ONLY): 64 min   Charges:   PT Evaluation $Initial PT Evaluation Tier I: 1 Procedure           [name] L 11/29/2014, 3:01 PM

## 2014-11-29 NOTE — Progress Notes (Signed)
Patient had morning EKG done, critical results came back as a stemi. Patient has really bad tremors, Dr. Onalee Hua was shown the results and she looked at troponins which were all normal, she thinks the tremors are giving [place] these results. RN also notified. EKG results placed in chart.

## 2014-11-29 NOTE — Care Management Note (Signed)
Case Management Note  Patient Details  Name: [name] MRN: [id number] Date of Birth: 10-22-1934  Subjective/Objective:                  Pt admitted from home with CP and weakness. Pt very evasive with CM about support at home and discharge planning. Pt stated that the questions that CM was asking was too personal and she was not going to answer them. CM was able to extract from pt that she is living at home with paid caregivers. Pt stated she was in-between PCP's at this time. Pt stated that she might want to go home with [facility] or to SNF at [facility]. Pt did state that she has a rollator (which was in the room) and a BSC, and grab bars in the shower. CM did explain to pt the difference in OBS vs Inpatient for SNF and pt became very agitated with CM and pt stated "I did not ask you to come in and help me".   Action/Plan: CSW did speak about SNF and pt would not speak with CSW in detail about discharge plans. PT has attempted to see pt as well. Will continue to follow for discharge planning needs. At this time, unsure of discharge disposition.  Expected Discharge Date:  11/30/14             Expected Discharge Plan:  Home w Home Health Services  In-House Referral:  Clinical Social Work  Discharge planning Services  CM Consult  Post Acute Care Choice:    Choice offered to:  Patient  DME Arranged:    DME Agency:     HH Arranged:    HH Agency:     Status of Service:  In process, will continue to follow  Medicare Important Message Given:    Date Medicare IM Given:    Medicare IM give by:    Date Additional Medicare IM Given:    Additional Medicare Important Message give by:     If discussed at Long Length of Stay Meetings, dates discussed:    Additional Comments:  Cheryl Flash, RN 11/29/2014, 4:21 PM

## 2014-11-29 NOTE — Clinical Social Work Note (Signed)
Clinical Social Work Assessment  Patient Details  Name: [name] MRN: [id number] Date of Birth: 1935-03-09  Date of referral:  11/29/14               Reason for consult:  Facility Placement                Permission sought to share information with:    Permission granted to share information::     Name::        Agency::     Relationship::     Contact Information:     Housing/Transportation Living arrangements for the past 2 months:  Single Family Home Source of Information:  Patient Patient Interpreter Needed:  None Criminal Activity/Legal Involvement Pertinent to Current Situation/Hospitalization:  No - Comment as needed Significant Relationships:  Other(Comment) (pt would not answer question) Lives with:  Self Do you feel safe going back to the place where you live?  Yes Need for family participation in patient care:  No (Coment)  Care giving concerns:  Pt lives alone.    Social Worker assessment / plan:  CSW met with pt at bedside. Pt alert and oriented and immediately very hostile towards CSW asking, "What are you here for?" CSW explained role and discussed d/c planning with pt. She initially said she wanted to return home. CSW asked about admission diagnosis and pt responded, "I'm sure you know from my chart." She did share that she lives alone and has a hired aid from 3-7 pm to assist with dinner, cleaning, and bathing. At baseline, pt ambulates with a walker. CSW attempted to discuss support system and family involvement, but pt states, "I think that's personal." PT evaluated pt earlier today and recommend SNF. Pt expressed that there was a lot of "commotion" during evaluation and she did not feel comfortable doing much due to swelling in her feet and other things. CSW discussed SNF placement, including Medicare coverage/criteria. Pt became very upset when it was shared that she may not meet this criteria for hospital stay and was particularly upset that CSW would even discuss  SNF in that case. She states she cannot afford private pay, and if she went home she would have to make decision whether to increase in home aids. CSW agreed to follow up with pt tomorrow and pt requested that CSW take SNF list out of the room at this time.    Employment status:  Retired Forensic scientist:  Medicare PT Recommendations:  [place] / Referral to community resources:  [place]  Patient/Family's Response to care:  Pt not interested in further discussing d/c plan at this time.   Patient/Family's Understanding of and Emotional Response to Diagnosis, Current Treatment, and Prognosis:  Unable to fully explore this as pt felt CSW was asking questions that were too personal in nature.   Emotional Assessment Appearance:  Appears stated age Attitude/Demeanor/Rapport:  Hostile Affect (typically observed):  Defensive Orientation:  Oriented to Self, Oriented to Place, Oriented to  Time, Oriented to Situation Alcohol / Substance use:  Not Applicable Psych involvement (Current and /or in the community):  No (Comment)  Discharge Needs  Concerns to be addressed:  Discharge Planning Concerns Readmission within the last 30 days:  No Current discharge risk:  Lives alone Barriers to Discharge:  Continued Medical Work up   [employer], [employer], [place] 11/29/2014, 2:22 PM [phone]

## 2014-11-29 NOTE — Progress Notes (Signed)
Notified by respiratory of abnormal EKG. MD notified by respiratory and no new orders given. MD suspects it is due to the patients tremors.

## 2014-11-29 NOTE — Progress Notes (Signed)
PROGRESS NOTE  [name] ZOX:[id number] DOB: Oct 22, 1934 DOA: 11/28/2014 PCP: Judie Bonus, MD  Assessment/Plan: Chest pain: Likely musculoskeletal in nature as this is reproducible with inspiration and palpation of the area. Troponin negative. EKG without overt sign of ACS. CXR without evidence of pneumonia or rib fracture. Unlikely due to PE given negative Dopplers and patient being anticoagulated with INR of 2.7.  - troponins negative - Tele  ?CHF -echo torsemide as patient allergic to lasix -weight is elevated from prevous visit (140 to 160)  Lower extremity swelling: Likely secondary to venous insufficiency and mild CHF. Venous Dopplers performed in the ED without evidence of DVT. - Torsemide - Compression stockings  Diastolic congestive heart failure: Last Echo at [facility] w/ EF >55% Grade 1 Diastolic dysfunction. BNP 238. No home diuretics. Patient does not have a cardiologist. - Diuresis as above - Repeat echo  H/o PE: on lifelong anticoagulation. INR 2.7 on 11/27/14 - continue coumadin  Pressure Ulcers: bilat heels. Stage I - space boots  Generalized weakness: - PT/OT- suspect may need rehab- lives alone  Parkinson's: Patient is followed by [facility] for this. Her tremors at baseline. She is currently not on any medications. - Follow-up with outpatient team  Code Status: full Family Communication: patient Disposition Plan:    Consultants:    Procedures: Echo pending  HPI/Subjective: Does endorse SOB, pain not on sides anymore, now in center of chest  Objective: Filed Vitals:   11/28/14 1822  BP: 140/85  Pulse: 82  Temp: 98.5 F (36.9 C)  Resp: 18   No intake or output data in the 24 hours ending 11/29/14 0936 Filed Weights   11/28/14 1449 11/28/14 1822  Weight: 63.504 kg (140 lb) 72.3 kg (159 lb 6.3 oz)    Exam:   General:  Elderly, frail appearing  Cardiovascular: rrr  Respiratory: ?crackles at bases  Abdomen: +BS,  soft  Musculoskeletal: b/l LE edema   Data Reviewed: Basic Metabolic Panel:  Recent Labs Lab 11/28/14 1617 11/29/14 0628  NA 141 143  K 4.1 3.7  CL 106 108  CO2 27 26  GLUCOSE 94 95  BUN 17 17  CREATININE 1.20* 0.81  CALCIUM 9.0 8.4*   Liver Function Tests: No results for input(s): AST, ALT, ALKPHOS, BILITOT, PROT, ALBUMIN in the last 168 hours. No results for input(s): LIPASE, AMYLASE in the last 168 hours. No results for input(s): AMMONIA in the last 168 hours. CBC:  Recent Labs Lab 11/28/14 1617 11/29/14 0628  WBC 10.3 8.0  NEUTROABS 8.1*  --   HGB 12.7 12.3  HCT 38.9 37.7  MCV 89.0 89.3  PLT 219 209   Cardiac Enzymes:  Recent Labs Lab 11/28/14 1617 11/28/14 1931 11/29/14 0049 11/29/14 0628  TROPONINI <0.03 <0.03 <0.03 <0.03   BNP (last 3 results)  Recent Labs  11/28/14 1617  BNP 238.0*    ProBNP (last 3 results) No results for input(s): PROBNP in the last 8760 hours.  CBG: No results for input(s): GLUCAP in the last 168 hours.  No results found for this or any previous visit (from the past 240 hour(s)).   Studies: Dg Chest 1 View  11/28/2014   CLINICAL DATA:  Short of breath and weakness. Initial encounter. No injury to the chest or fever.  EXAM: CHEST  1 VIEW  COMPARISON:  08/12/2009.  FINDINGS: The superior chest on the RIGHT is obscured by the patient's head. The cardiopericardial silhouette appears within normal limits for projection. No airspace disease  or pleural effusion. Monitoring leads project over the chest. Age related calcification of the tracheobronchial tree. Patient is rotated to the RIGHT. Linear scarring is present in the LEFT lower lobe, projected over the cardiopericardial silhouette.  IMPRESSION: No acute cardiopulmonary disease. Exam degraded by projection and patient position.   Electronically Signed   By: Andreas Newport M.D.   On: 11/28/2014 16:44   US Venous Img Lower Bilateral  11/28/2014   CLINICAL DATA:  BILATERAL  lower extremity swelling. History of pulmonary emboli. Symptoms for a few weeks.  EXAM: BILATERAL LOWER EXTREMITY VENOUS DOPPLER ULTRASOUND  TECHNIQUE: Gray-scale sonography with graded compression, as well as color Doppler and duplex ultrasound were performed to evaluate the lower extremity deep venous systems from the level of the common femoral vein and including the common femoral, femoral, profunda femoral, popliteal and calf veins including the posterior tibial, peroneal and gastrocnemius veins when visible. The superficial great saphenous vein was also interrogated. Spectral Doppler was utilized to evaluate flow at rest and with distal augmentation maneuvers in the common femoral, femoral and popliteal veins.  COMPARISON:  None.  FINDINGS: RIGHT LOWER EXTREMITY  Common Femoral Vein: No evidence of thrombus. Normal compressibility, respiratory phasicity and response to augmentation.  Saphenofemoral Junction: No evidence of thrombus. Normal compressibility and flow on color Doppler imaging.  Profunda Femoral Vein: No evidence of thrombus. Normal compressibility and flow on color Doppler imaging.  Femoral Vein: No evidence of thrombus. Normal compressibility, respiratory phasicity and response to augmentation.  Popliteal Vein: No evidence of thrombus. Normal compressibility, respiratory phasicity and response to augmentation.  Calf Veins: No evidence of thrombus. Normal compressibility and flow on color Doppler imaging.  Superficial Great Saphenous Vein: No evidence of thrombus. Normal compressibility and flow on color Doppler imaging.  Venous Reflux:  None.  Other Findings:  None.  LEFT LOWER EXTREMITY  Common Femoral Vein: No evidence of thrombus. Normal compressibility, respiratory phasicity and response to augmentation.  Saphenofemoral Junction: No evidence of thrombus. Normal compressibility and flow on color Doppler imaging.  Profunda Femoral Vein: No evidence of thrombus. Normal compressibility and flow  on color Doppler imaging.  Femoral Vein: No evidence of thrombus. Normal compressibility, respiratory phasicity and response to augmentation.  Popliteal Vein: No evidence of thrombus. Normal compressibility, respiratory phasicity and response to augmentation.  Calf Veins: No evidence of thrombus. Normal compressibility and flow on color Doppler imaging.  Superficial Great Saphenous Vein: No evidence of thrombus. Normal compressibility and flow on color Doppler imaging.  Venous Reflux:  None.  Other Findings:  BILATERAL lower extremity edema.  IMPRESSION: No evidence of BILATERAL deep venous thrombosis.   Electronically Signed   By: Davonna Belling M.D.   On: 11/28/2014 18:33    Scheduled Meds: . aspirin  324 mg Oral Once  . sodium chloride  3 mL Intravenous Q12H  . sodium chloride  3 mL Intravenous Q12H  . torsemide  20 mg Oral Daily  . Warfarin - Pharmacist Dosing Inpatient   Does not apply q1800   Continuous Infusions:  Antibiotics Given (last 72 hours)    None      Principal Problem:   Chest pain Active Problems:   Venous (peripheral) insufficiency   Generalized weakness   LEG EDEMA   Pressure ulcer, heel   AKI (acute kidney injury)   Chronic diastolic congestive heart failure   Parkinson's disease   Pain in the chest    Time spent: 25 min    VANN, JESSICA  [facility]  Pager [phone]. If 7PM-7AM, please contact night-coverage at [email or web address], password [email or web address] 11/29/2014, 9:36 AM

## 2014-11-30 ENCOUNTER — Telehealth: Payer: Self-pay | Admitting: *Deleted

## 2014-11-30 DIAGNOSIS — I5032 Chronic diastolic (congestive) heart failure: Secondary | ICD-10-CM | POA: Diagnosis not present

## 2014-11-30 DIAGNOSIS — R079 Chest pain, unspecified: Secondary | ICD-10-CM | POA: Diagnosis not present

## 2014-11-30 DIAGNOSIS — G20 Parkinson's disease: Secondary | ICD-10-CM | POA: Diagnosis not present

## 2014-11-30 DIAGNOSIS — R0602 Shortness of breath: Secondary | ICD-10-CM | POA: Diagnosis not present

## 2014-11-30 DIAGNOSIS — R609 Edema, unspecified: Secondary | ICD-10-CM | POA: Diagnosis not present

## 2014-11-30 LAB — BASIC METABOLIC PANEL
ANION GAP: 10 (ref 5–15)
BUN: 15 mg/dL (ref 6–20)
CHLORIDE: 106 mmol/L (ref 101–111)
CO2: 26 mmol/L (ref 22–32)
Calcium: 8.8 mg/dL — ABNORMAL LOW (ref 8.9–10.3)
Creatinine, Ser: 0.69 mg/dL (ref 0.44–1.00)
GFR calc Af Amer: >60 mL/min (ref >60)
GFR calc non Af Amer: >60 mL/min (ref >60)
Glucose, Bld: 101 mg/dL — ABNORMAL HIGH (ref 65–99)
Potassium: 3.8 mmol/L (ref 3.5–5.1)
Sodium: 142 mmol/L (ref 135–145)

## 2014-11-30 LAB — CBC
HEMATOCRIT: 41.5 % (ref 36.0–46.0)
Hemoglobin: 13.5 g/dL (ref 12.0–15.0)
MCH: 28.7 pg (ref 26.0–34.0)
MCHC: 32.5 g/dL (ref 30.0–36.0)
MCV: 88.3 fL (ref 78.0–100.0)
PLATELETS: 219 10*3/uL (ref 150–400)
RBC: 4.7 MIL/uL (ref 3.87–5.11)
RDW: 13.2 % (ref 11.5–15.5)
WBC: 8.4 10*3/uL (ref 4.0–10.5)

## 2014-11-30 LAB — PROTIME-INR
INR: 2.45 — ABNORMAL HIGH (ref 0.00–1.49)
Prothrombin Time: 26.3 seconds — ABNORMAL HIGH (ref 11.6–15.2)

## 2014-11-30 MED ORDER — WARFARIN SODIUM 2.5 MG PO TABS: 2.5000 mg | ORAL_TABLET | Freq: Every evening | ORAL | Status: DC

## 2014-11-30 MED ORDER — WARFARIN SODIUM 2.5 MG PO TABS: 3.7500 mg | ORAL_TABLET | Freq: Once | ORAL | Status: DC

## 2014-11-30 MED ORDER — TORSEMIDE 20 MG PO TABS: 20.0000 mg | ORAL_TABLET | Freq: Every day | ORAL | Status: DC

## 2014-11-30 MED ORDER — ALLEVYN ADHESIVE EX PADS: 1.0000 | MEDICATED_PAD | Freq: Every day | CUTANEOUS | Status: DC | PRN

## 2014-11-30 NOTE — Care Management Note (Signed)
Case Management Note  Patient Details  Name: [name] MRN: [id number] Date of Birth: [birth year]  Subjective/Objective:                    Action/Plan:   Expected Discharge Date:  11/29/14               Expected Discharge Plan:  Home/Self Care  In-House Referral:  Clinical Social Work  Discharge planning Services  CM Consult  Post Acute Care Choice:  NA Choice offered to:  NA  DME Arranged:    DME Agency:     HH Arranged:    HH Agency:     Status of Service:  Completed, signed off  Medicare Important Message Given:    Date Medicare IM Given:    Medicare IM give by:    Date Additional Medicare IM Given:    Additional Medicare Important Message give by:     If discussed at Long Length of Stay Meetings, dates discussed:    Additional Comments: Pt changed to OBS this morning. Pt refused to sign CC 44 and this was witnessed by CSW, Derenda Fennel. Pt refuses any other assistance from Cm. No further Cm needs noted. Arlyss Queen [place], RN 11/30/2014, 10:48 AM

## 2014-11-30 NOTE — Care Management Note (Signed)
Case Management Note  Patient Details  Name: [name] MRN: [id number] Date of Birth: 1935-02-02  Subjective/Objective:                    Action/Plan:   Expected Discharge Date:  11/29/14               Expected Discharge Plan:  Home/Self Care  In-House Referral:  Clinical Social Work  Discharge planning Services  CM Consult  Post Acute Care Choice:  NA Choice offered to:  NA  DME Arranged:    DME Agency:     HH Arranged:    HH Agency:     Status of Service:  Completed, signed off  Medicare Important Message Given:    Date Medicare IM Given:    Medicare IM give by:    Date Additional Medicare IM Given:    Additional Medicare Important Message give by:     If discussed at Long Length of Stay Meetings, dates discussed:    Additional Comments: Pt changed to OBS this am. Pt refused to sign CC 44 as witnessed by Derenda Fennel, CSW. Pt refused to allow Cm to arrange any HH or post hospital follow up. No other CM needs noted.  Arlyss Queen [place], RN 11/30/2014, 1:28 PM

## 2014-11-30 NOTE — Clinical Social Work Note (Signed)
CSW met with pt along with CM to discuss d/c plan. Pt states she has already decided she is going home today and has been making plans. CSW will sign off, but can be reconsulted if needed.  [name], [place]

## 2014-11-30 NOTE — Progress Notes (Addendum)
ANTICOAGULATION CONSULT NOTE - follow up  Pharmacy Consult for Coumadin (chronic Rx PTA) Indication: H/O PE  Allergies  Allergen Reactions  . Contrast Media [Iodinated Diagnostic Agents] Anaphylaxis and Shortness Of Breath  . Vancomycin Shortness Of Breath    REACTION: SOB  . Lasix [Furosemide]   . Vioxx [Rofecoxib]     REACTION: irregular heartbeat   Patient Measurements: Height: 5' 4.5" (163.8 cm) Weight: 150 lb 12.7 oz (68.4 kg) IBW/kg (Calculated) : 55.85  Vital Signs: Temp: 98.2 F (36.8 C) (06/10 0654) Temp Source: Oral (06/10 0654) BP: 145/60 mmHg (06/10 0654) Pulse Rate: 78 (06/10 0654)  Labs:  Recent Labs  11/28/14 1617 11/28/14 1833 11/28/14 1931 11/29/14 0049 11/29/14 0628 11/30/14 0724  HGB 12.7  --   --   --  12.3 13.5  HCT 38.9  --   --   --  37.7 41.5  PLT 219  --   --   --  209 219  LABPROT  --  27.0*  --   --  28.2* 26.3*  INR  --  2.54*  --   --  2.69* 2.45*  CREATININE 1.20*  --   --   --  0.81 0.69  TROPONINI <0.03  --  <0.03 <0.03 <0.03  --    Estimated Creatinine Clearance: 55.7 mL/min (by C-G formula based on Cr of 0.69).  Medical History: Past Medical History  Diagnosis Date  . CHF (congestive heart failure)   . Venous stasis   . Parkinson's disease   . History of pulmonary embolism    Medications:  Prescriptions prior to admission  Medication Sig Dispense Refill Last Dose  . acetaminophen (TYLENOL) 500 MG tablet Take 500 mg by mouth every 6 (six) hours as needed for mild pain or moderate pain.   11/27/2014 at Unknown time  . warfarin (COUMADIN) 2.5 MG tablet Take as directed by anticoagulation clinic (Patient taking differently: Take 2.5-3.75 mg by mouth every evening. Take ONE TABLET EVERY EVENING, EXCEPT TAKE ONE AND ONE-HALF TABLET ON FRIDAYS) 105 tablet 1 11/27/2014 at 2100   Assessment: 80yo female on chronic Coumadin PTA.  Home dose listed above.  INR therapeutic on admission.  Goal of Therapy:  INR 2-3 Monitor platelets by  anticoagulation protocol: Yes   Plan:  Coumadin 3.75mg  po today x 1 (home dose) INR daily  Gunnard Dorrance A 11/30/2014,8:11 AM

## 2014-11-30 NOTE — Discharge Summary (Addendum)
Physician Discharge Summary  [name] EAV:[id number] DOB: 1935-04-20 DOA: 11/28/2014  PCP: Judie Bonus, MD  Admit date: 11/28/2014 Discharge date: 11/30/2014  Time spent: 35 minutes  Recommendations for Outpatient Follow-up:  1. Outpatient echo 2. BMP 1 week  Discharge Diagnoses:  Principal Problem:   Chest pain Active Problems:   Venous (peripheral) insufficiency   Generalized weakness   LEG EDEMA   Pressure ulcer, heel   AKI (acute kidney injury)   Chronic diastolic congestive heart failure   Parkinson's disease   Pain in the chest   SOB (shortness of breath)   Discharge Condition: improved Diet recommendation: cardiac  Filed Weights   11/28/14 1822 11/29/14 2114 11/30/14 0700  Weight: 72.3 kg (159 lb 6.3 oz) 66.7 kg (147 lb 0.8 oz) 68.4 kg (150 lb 12.7 oz)    History of present illness:  [name] is a 80 y.o. female  L Side pain. Started last night. Achy and sharp in nature. Worse w/ certain movements. Comes and goes.   Intermittent SOB. Baseline per pt. comes on multiple times per day.  bilat LE swelling. Started 2 wks ago. Constant. Getting worse. Elevation w/o improvement. Patient has had this problem in the past. She was once given Lasix but she developed shortness of breath with this.  Denies fevers, nausea, vomiting, diaphoresis, lightheadedness, headache, LOC, dysuria, frequency.  Patient has had progressive generalized weakness.   Hospital Course:  Chest pain: Likely musculoskeletal in nature as this is reproducible with inspiration and palpation of the area. Troponin negative. EKG without overt sign of ACS. CXR without evidence of pneumonia or rib fracture. Unlikely due to PE given negative Dopplers and patient being anticoagulated with INR of 2.7.  - troponins negative  ?CHF -echo- patient refused to do inpatient -torsemide as patient allergic to lasix -weight is elevated from prevous visit (140 to 160) -patient improved after  diuretic given  Lower extremity swelling: Likely secondary to venous insufficiency and mild CHF. Venous Dopplers performed in the ED without evidence of DVT. - Torsemide- LE swelling improved -patient will not elevate legs  Diastolic congestive heart failure: Last Echo at [facility] w/ EF >55% Grade 1 Diastolic dysfunction. BNP 238. No home diuretics. Patient does not have a cardiologist. - Diuresis as above - Refused echo  H/o PE: on lifelong anticoagulation. INR 2.7 on 11/27/14 - continue coumadin  Pressure Ulcers: bilat heels. Stage I - space boots  Generalized weakness: - PT/OT- suspect may need rehab- lives alone  Parkinson's: Patient is followed by [facility] for this. Her tremors at baseline. She is currently not on any medications. - Follow-up with outpatient team  Patient was upset with obs status- has decided to forego echo here and do outpatient is her PCP says ok -feels she was treated badly in regards to the obs status- I explained and expressed my apologies and patient understanding -says she has helpers at home so does not need home health  Procedures:    Consultations:  Tele psych- has capacity  Discharge Exam: Filed Vitals:   11/30/14 0654  BP: 145/60  Pulse: 78  Temp: 98.2 F (36.8 C)  Resp: 18    General: A+Ox3, NAD   Discharge Instructions   Discharge Instructions    Diet - low sodium heart healthy    Complete by:  As directed      Discharge instructions    Complete by:  As directed   Outpatient echo BMP 1 week re: Cr  Increase activity slowly    Complete by:  As directed           Current Discharge Medication List    START taking these medications   Details  torsemide (DEMADEX) 20 MG tablet Take 1 tablet (20 mg total) by mouth daily. Qty: 30 tablet, Refills: 0    Wound Dressings (ALLEVYN ADHESIVE) PADS Apply 1 patch topically daily as needed. Qty: 10 each, Refills: 2      CONTINUE these medications which have CHANGED    Details  warfarin (COUMADIN) 2.5 MG tablet Take 1-1.5 tablets (2.5-3.75 mg total) by mouth every evening. Take ONE TABLET EVERY EVENING, EXCEPT TAKE ONE AND ONE-HALF TABLET ON FRIDAYS Qty: 105 tablet, Refills: 1      CONTINUE these medications which have NOT CHANGED   Details  acetaminophen (TYLENOL) 500 MG tablet Take 500 mg by mouth every 6 (six) hours as needed for mild pain or moderate pain.       Allergies  Allergen Reactions  . Contrast Media [Iodinated Diagnostic Agents] Anaphylaxis and Shortness Of Breath  . Vancomycin Shortness Of Breath    REACTION: SOB  . Lasix [Furosemide]   . Vioxx [Rofecoxib]     REACTION: irregular heartbeat      The results of significant diagnostics from this hospitalization (including imaging, microbiology, ancillary and laboratory) are listed below for reference.    Significant Diagnostic Studies: Dg Chest 1 View  11/28/2014   CLINICAL DATA:  Short of breath and weakness. Initial encounter. No injury to the chest or fever.  EXAM: CHEST  1 VIEW  COMPARISON:  08/12/2009.  FINDINGS: The superior chest on the RIGHT is obscured by the patient's head. The cardiopericardial silhouette appears within normal limits for projection. No airspace disease or pleural effusion. Monitoring leads project over the chest. Age related calcification of the tracheobronchial tree. Patient is rotated to the RIGHT. Linear scarring is present in the LEFT lower lobe, projected over the cardiopericardial silhouette.  IMPRESSION: No acute cardiopulmonary disease. Exam degraded by projection and patient position.   Electronically Signed   By: Andreas Newport M.D.   On: 11/28/2014 16:44   US Venous Img Lower Bilateral  11/28/2014   CLINICAL DATA:  BILATERAL lower extremity swelling. History of pulmonary emboli. Symptoms for a few weeks.  EXAM: BILATERAL LOWER EXTREMITY VENOUS DOPPLER ULTRASOUND  TECHNIQUE: Gray-scale sonography with graded compression, as well as color Doppler and  duplex ultrasound were performed to evaluate the lower extremity deep venous systems from the level of the common femoral vein and including the common femoral, femoral, profunda femoral, popliteal and calf veins including the posterior tibial, peroneal and gastrocnemius veins when visible. The superficial great saphenous vein was also interrogated. Spectral Doppler was utilized to evaluate flow at rest and with distal augmentation maneuvers in the common femoral, femoral and popliteal veins.  COMPARISON:  None.  FINDINGS: RIGHT LOWER EXTREMITY  Common Femoral Vein: No evidence of thrombus. Normal compressibility, respiratory phasicity and response to augmentation.  Saphenofemoral Junction: No evidence of thrombus. Normal compressibility and flow on color Doppler imaging.  Profunda Femoral Vein: No evidence of thrombus. Normal compressibility and flow on color Doppler imaging.  Femoral Vein: No evidence of thrombus. Normal compressibility, respiratory phasicity and response to augmentation.  Popliteal Vein: No evidence of thrombus. Normal compressibility, respiratory phasicity and response to augmentation.  Calf Veins: No evidence of thrombus. Normal compressibility and flow on color Doppler imaging.  Superficial Great Saphenous Vein: No evidence of thrombus. Normal compressibility  and flow on color Doppler imaging.  Venous Reflux:  None.  Other Findings:  None.  LEFT LOWER EXTREMITY  Common Femoral Vein: No evidence of thrombus. Normal compressibility, respiratory phasicity and response to augmentation.  Saphenofemoral Junction: No evidence of thrombus. Normal compressibility and flow on color Doppler imaging.  Profunda Femoral Vein: No evidence of thrombus. Normal compressibility and flow on color Doppler imaging.  Femoral Vein: No evidence of thrombus. Normal compressibility, respiratory phasicity and response to augmentation.  Popliteal Vein: No evidence of thrombus. Normal compressibility, respiratory phasicity  and response to augmentation.  Calf Veins: No evidence of thrombus. Normal compressibility and flow on color Doppler imaging.  Superficial Great Saphenous Vein: No evidence of thrombus. Normal compressibility and flow on color Doppler imaging.  Venous Reflux:  None.  Other Findings:  BILATERAL lower extremity edema.  IMPRESSION: No evidence of BILATERAL deep venous thrombosis.   Electronically Signed   By: Davonna Belling M.D.   On: 11/28/2014 18:33    Microbiology: No results found for this or any previous visit (from the past 240 hour(s)).   Labs: Basic Metabolic Panel:  Recent Labs Lab 11/28/14 1617 11/29/14 0628 11/30/14 0724  NA 141 143 142  K 4.1 3.7 3.8  CL 106 108 106  CO2 27 26 26   GLUCOSE 94 95 101*  BUN 17 17 15   CREATININE 1.20* 0.81 0.69  CALCIUM 9.0 8.4* 8.8*   Liver Function Tests: No results for input(s): AST, ALT, ALKPHOS, BILITOT, PROT, ALBUMIN in the last 168 hours. No results for input(s): LIPASE, AMYLASE in the last 168 hours. No results for input(s): AMMONIA in the last 168 hours. CBC:  Recent Labs Lab 11/28/14 1617 11/29/14 0628 11/30/14 0724  WBC 10.3 8.0 8.4  NEUTROABS 8.1*  --   --   HGB 12.7 12.3 13.5  HCT 38.9 37.7 41.5  MCV 89.0 89.3 88.3  PLT 219 209 219   Cardiac Enzymes:  Recent Labs Lab 11/28/14 1617 11/28/14 1931 11/29/14 0049 11/29/14 0628  TROPONINI <0.03 <0.03 <0.03 <0.03   BNP: BNP (last 3 results)  Recent Labs  11/28/14 1617  BNP 238.0*    ProBNP (last 3 results) No results for input(s): PROBNP in the last 8760 hours.  CBG: No results for input(s): GLUCAP in the last 168 hours.     SignedMarlin Canary  [facility] 11/30/2014, 9:33 AM

## 2014-11-30 NOTE — Telephone Encounter (Signed)
Transition Care Management Follow-up Telephone Call D/C 11/30/14  How have you been since you were released from the hospital? Pt states she is doing ok   Do you understand why you were in the hospital? YES   Do you understand the discharge instrcutions? YES  Items Reviewed:  Medications reviewed: YES  Allergies reviewed: YES  Dietary changes reviewed: NO  Referrals reviewed: No referral needed   Functional Questionnaire:   Activities of Daily Living (ADLs):   She states she are independent in the following: ambulation, bathing and hygiene, feeding, continence, grooming, toileting and dressing States she doesn't  require assistance    Any transportation issues/concerns?: NO   Any patient concerns? NO   Confirmed importance and date/time of follow-up visits scheduled: YES, appt made 12/06/14   Confirmed with patient if condition begins to worsen call PCP or go to the ER.  P

## 2014-11-30 NOTE — Progress Notes (Signed)
Discharge instructions and prescriptions given, verbalized understanding, out in stable condition via w/c with staff. 

## 2014-12-06 ENCOUNTER — Inpatient Hospital Stay: Payer: Medicare Other | Admitting: Family

## 2014-12-12 ENCOUNTER — Other Ambulatory Visit: Payer: Self-pay | Admitting: General Practice

## 2014-12-12 MED ORDER — WARFARIN SODIUM 2.5 MG PO TABS: ORAL_TABLET | ORAL | Status: DC

## 2014-12-20 ENCOUNTER — Emergency Department (HOSPITAL_COMMUNITY): Payer: Medicare Other

## 2014-12-20 ENCOUNTER — Encounter (HOSPITAL_COMMUNITY): Payer: Self-pay | Admitting: *Deleted

## 2014-12-20 ENCOUNTER — Inpatient Hospital Stay (HOSPITAL_COMMUNITY)
Admission: EM | Admit: 2014-12-20 | Discharge: 2014-12-21 | DRG: 948 | Disposition: A | Discharge status: Home / Self Care | Payer: Medicare Other | Attending: Internal Medicine | Admitting: Internal Medicine

## 2014-12-20 DIAGNOSIS — G20 Parkinson's disease: Secondary | ICD-10-CM | POA: Diagnosis present

## 2014-12-20 DIAGNOSIS — R197 Diarrhea, unspecified: Secondary | ICD-10-CM | POA: Diagnosis present

## 2014-12-20 DIAGNOSIS — R609 Edema, unspecified: Secondary | ICD-10-CM | POA: Diagnosis present

## 2014-12-20 DIAGNOSIS — R072 Precordial pain: Secondary | ICD-10-CM

## 2014-12-20 DIAGNOSIS — R29898 Other symptoms and signs involving the musculoskeletal system: Secondary | ICD-10-CM

## 2014-12-20 DIAGNOSIS — Z7901 Long term (current) use of anticoagulants: Secondary | ICD-10-CM

## 2014-12-20 DIAGNOSIS — Z86711 Personal history of pulmonary embolism: Secondary | ICD-10-CM

## 2014-12-20 DIAGNOSIS — R531 Weakness: Secondary | ICD-10-CM | POA: Diagnosis not present

## 2014-12-20 DIAGNOSIS — R0789 Other chest pain: Secondary | ICD-10-CM | POA: Diagnosis present

## 2014-12-20 DIAGNOSIS — I509 Heart failure, unspecified: Secondary | ICD-10-CM | POA: Diagnosis present

## 2014-12-20 DIAGNOSIS — Z86718 Personal history of other venous thrombosis and embolism: Secondary | ICD-10-CM | POA: Diagnosis not present

## 2014-12-20 LAB — URINALYSIS, ROUTINE W REFLEX MICROSCOPIC
Bilirubin Urine: NEGATIVE
GLUCOSE, UA: NEGATIVE mg/dL
Ketones, ur: NEGATIVE mg/dL
NITRITE: NEGATIVE
PH: 5 (ref 5.0–8.0)
PROTEIN: NEGATIVE mg/dL
SPECIFIC GRAVITY, URINE: 1.01 (ref 1.005–1.030)
UROBILINOGEN UA: 0.2 mg/dL (ref 0.0–1.0)

## 2014-12-20 LAB — CBC WITH DIFFERENTIAL/PLATELET
BASOS ABS: 0 10*3/uL (ref 0.0–0.1)
Basophils Relative: 0 % (ref 0–1)
Eosinophils Absolute: 0.2 10*3/uL (ref 0.0–0.7)
Eosinophils Relative: 2 % (ref 0–5)
HCT: 40.7 % (ref 36.0–46.0)
Hemoglobin: 13 g/dL (ref 12.0–15.0)
LYMPHS ABS: 1.4 10*3/uL (ref 0.7–4.0)
Lymphocytes Relative: 19 % (ref 12–46)
MCH: 28.4 pg (ref 26.0–34.0)
MCHC: 31.9 g/dL (ref 30.0–36.0)
MCV: 89.1 fL (ref 78.0–100.0)
Monocytes Absolute: 0.6 10*3/uL (ref 0.1–1.0)
Monocytes Relative: 9 % (ref 3–12)
NEUTROS ABS: 5.3 10*3/uL (ref 1.7–7.7)
Neutrophils Relative %: 70 % (ref 43–77)
Platelets: 227 10*3/uL (ref 150–400)
RBC: 4.57 MIL/uL (ref 3.87–5.11)
RDW: 13.4 % (ref 11.5–15.5)
WBC: 7.6 10*3/uL (ref 4.0–10.5)

## 2014-12-20 LAB — COMPREHENSIVE METABOLIC PANEL
ALBUMIN: 4.4 g/dL (ref 3.5–5.0)
ALK PHOS: 50 U/L (ref 38–126)
ALT: 20 U/L (ref 14–54)
AST: 33 U/L (ref 15–41)
Anion gap: 9 (ref 5–15)
BILIRUBIN TOTAL: 0.8 mg/dL (ref 0.3–1.2)
BUN: 11 mg/dL (ref 6–20)
CALCIUM: 8.8 mg/dL — AB (ref 8.9–10.3)
CO2: 26 mmol/L (ref 22–32)
Chloride: 104 mmol/L (ref 101–111)
Creatinine, Ser: 0.74 mg/dL (ref 0.44–1.00)
GFR calc Af Amer: >60 mL/min (ref >60)
GFR calc non Af Amer: >60 mL/min (ref >60)
Glucose, Bld: 91 mg/dL (ref 65–99)
Potassium: 3.7 mmol/L (ref 3.5–5.1)
SODIUM: 139 mmol/L (ref 135–145)
Total Protein: 7.2 g/dL (ref 6.5–8.1)

## 2014-12-20 LAB — BRAIN NATRIURETIC PEPTIDE: B Natriuretic Peptide: 89 pg/mL (ref 0.0–100.0)

## 2014-12-20 LAB — URINE MICROSCOPIC-ADD ON

## 2014-12-20 LAB — PROTIME-INR
INR: 3.94 — ABNORMAL HIGH (ref 0.00–1.49)
PROTHROMBIN TIME: 37.6 s — AB (ref 11.6–15.2)

## 2014-12-20 LAB — LIPASE, BLOOD: LIPASE: 33 U/L (ref 22–51)

## 2014-12-20 LAB — TROPONIN I: Troponin I: <0.03 ng/mL (ref <0.031)

## 2014-12-20 MED ORDER — ONDANSETRON HCL 4 MG PO TABS: 4.0000 mg | ORAL_TABLET | Freq: Four times a day (QID) | ORAL | Status: DC | PRN

## 2014-12-20 MED ORDER — LORAZEPAM 2 MG/ML IJ SOLN
1.0000 mg | Freq: Once | INTRAMUSCULAR | Status: DC
  Filled 2014-12-20: qty 1

## 2014-12-20 MED ORDER — ONDANSETRON HCL 4 MG/2ML IJ SOLN: 4.0000 mg | Freq: Four times a day (QID) | INTRAMUSCULAR | Status: DC | PRN

## 2014-12-20 MED ORDER — SODIUM CHLORIDE 0.9 % IJ SOLN
3.0000 mL | Freq: Two times a day (BID) | INTRAMUSCULAR | Status: DC
Start: 2014-12-20 — End: 2014-12-21
  Administered 2014-12-21: 3 mL via INTRAVENOUS

## 2014-12-20 MED ORDER — ACETAMINOPHEN 650 MG RE SUPP: 650.0000 mg | Freq: Four times a day (QID) | RECTAL | Status: DC | PRN

## 2014-12-20 MED ORDER — ACETAMINOPHEN 325 MG PO TABS
650.0000 mg | ORAL_TABLET | Freq: Four times a day (QID) | ORAL | Status: DC | PRN
  Administered 2014-12-21: 650 mg via ORAL
  Filled 2014-12-20 (×2): qty 2

## 2014-12-20 NOTE — Progress Notes (Signed)
ANTICOAGULATION CONSULT NOTE - Initial Consult  Pharmacy Consult for Coumadin (chronic Rx PTA) Indication: VTE Treatment, H/O PE  Allergies  Allergen Reactions  . Contrast Media [Iodinated Diagnostic Agents] Anaphylaxis and Shortness Of Breath  . Vancomycin Shortness Of Breath    REACTION: SOB  . Lasix [Furosemide]   . Vioxx [Rofecoxib]     REACTION: irregular heartbeat    Patient Measurements: Height: 5\' 3"  (160 cm) Weight: 140 lb (63.504 kg) IBW/kg (Calculated) : 52.4 Heparin Dosing Weight:   Vital Signs: Temp: 98.2 F (36.8 C) (06/30 2128) Temp Source: Oral (06/30 2128) BP: 140/65 mmHg (06/30 2128) Pulse Rate: 79 (06/30 2128)  Labs:  Recent Labs  12/20/14 1945  HGB 13.0  HCT 40.7  PLT 227  LABPROT 37.6*  INR 3.94*  CREATININE 0.74  TROPONINI <0.03    Estimated Creatinine Clearance: 52 mL/min (by C-G formula based on Cr of 0.74).   Medical History: Past Medical History  Diagnosis Date  . CHF (congestive heart failure)   . Venous stasis   . Parkinson's disease   . History of pulmonary embolism     Medications:   (Not in a hospital admission)  Assessment:  Continuation of coumadin PTA, history of PE INR elevated on admission  Goal of Therapy:  INR 2-3 Monitor platelets by anticoagulation protocol: Yes   Plan:  No Coumadin tonight due to elevated INR INR/PT daily Labs per protocol Raquel JamesPittman, Jael Kostick Bennett 12/20/2014,10:17 PM

## 2014-12-20 NOTE — ED Notes (Signed)
Patient assisted to bedpan, tolerated well 

## 2014-12-20 NOTE — ED Notes (Signed)
In by ems for c/o weakness and diarrhea for a week.

## 2014-12-20 NOTE — ED Notes (Signed)
Pt to department via EMS.  C/o nausea and vomiting for aprox 2 weeks.  Pt also reporting some diarrhea and weakness.

## 2014-12-20 NOTE — ED Notes (Signed)
Patient unable to stand for orthostatic 

## 2014-12-20 NOTE — H&P (Signed)
History and Physical  [name]:[id number] DOB: [birth year] DOA: 12/20/2014   PCP: Judie Bonus, MD  Referring Physician: ED/ Dr. Manus Gunning  Chief Complaint: leg weakness, generalized weakness  HPI:  80 year old female with a history of PE on warfarin, Parkinson's disease presents with one-day history of generalized weakness and lower extremity weakness. At baseline, the patient states that she is able to get around with a walker although she needs some assistance with transfers. Patient also complains of some intermittent chest discomfort and shortness of breath mostly at rest. The patient had similar complaints on her last admission from which she was discharged on 11/30/2014. The patient refused an echocardiogram and extensive workup at that time. Lower extremity duplex was negative for DVT. The patient was sent home on torsemide, but the patient has not taken this medication. She states that she has chronic lower extremity edema which she feels is about the same as usual. She denies any fevers, chills, headache, visual disturbance, focal extremity weakness, dysarthria, vomiting, abdominal pain, dysuria, hematuria. She's been complaining of loose stools on a daily basis for the past week without any hematochezia or melena. She has intermittent abdominal cramping with her loose stools. There has been no recent antibiotics. In the emergency department, the patient was afebrile and hemodynamically stable. She was saturation was 100% on room air. BMP and CBC were unremarkable. Hepatic enzymes were unremarkable. INR was 3.94. EKG revealed a lot of artifact secondary to the patient's tremor. Initial troponin was negative. Assessment/Plan: Generalized weakness/lower extremity weakness -The patient has diminished but not absent reflexes -consult neurology in am -MRI lumbar spine--ativan prior to MRI due to claustrophobia -B12 -CPK -TSH -UA without pyuria -am  cortisol -mag -PT/OT Atypical chest pain -Cycle troponins -EKG was poor quality secondary to patient's tremor History of pulmonary embolus -Continue Coumadin with pharmacy to assist with dosing Lower extremity edema/??hx of CHF -Question possible right side CHF with pt's intermitten dyspnea -echo -urine protein/creatinine ratio -CXR clear, 100% sat on RA Diarrhea -C diff PCR       Past Medical History  Diagnosis Date  . CHF (congestive heart failure)   . Venous stasis   . Parkinson's disease   . History of pulmonary embolism    History reviewed. No pertinent past surgical history. Social History:  reports that she has never smoked. She does not have any smokeless tobacco history on file. She reports that she does not drink alcohol or use illicit drugs.   Family History  Problem Relation Age of Onset  . Heart disease Mother     90s     Allergies  Allergen Reactions  . Contrast Media [Iodinated Diagnostic Agents] Anaphylaxis and Shortness Of Breath  . Vancomycin Shortness Of Breath    REACTION: SOB  . Lasix [Furosemide]   . Vioxx [Rofecoxib]     REACTION: irregular heartbeat      Prior to Admission medications   Medication Sig Start Date End Date Taking? Authorizing Provider  acetaminophen (TYLENOL) 500 MG tablet Take 500 mg by mouth every 6 (six) hours as needed for mild pain or moderate pain.   Yes Historical Provider, MD  warfarin (COUMADIN) 2.5 MG tablet Take as directed by anticoagulation clinic. Patient taking differently: Take 2.5-3.75 mg by mouth See admin instructions. Take 2.5mg  daily except Friday, take 3.75mg  12/12/14  Yes Judie Bonus, MD    Review of Systems:  Constitutional:  No weight loss, night sweats, Fevers, chills, fatigue.  Head&Eyes:  No headache.  No vision loss.  No eye pain or scotoma ENT:  No Difficulty swallowing,Tooth/dental problems,Sore throat,   Cardio-vascular:  NoOrthopnea, PND, swelling in lower extremities,   dizziness, palpitations  GI:  No  abdominal pain,vomiting, diarrhea, loss of appetite, hematochezia, melena, heartburn, indigestion, Resp:   No cough. No coughing up of blood .No wheezing.No chest wall deformity  Skin:  no rash or lesions.  GU:  no dysuria, change in color of urine, no urgency or frequency. No flank pain.  Musculoskeletal:  No joint pain or swelling. No decreased range of motion. Psych:  No change in mood or affect. No depression or anxiety. Neurologic: No headache, no dysesthesia, no focal weakness, no vision loss. No syncope  Physical Exam: Filed Vitals:   12/20/14 2030 12/20/14 2128 12/20/14 2200 12/20/14 2230  BP: 128/67 140/65 141/72 151/92  Pulse:  79 80 78  Temp:  98.2 F (36.8 C)    TempSrc:  Oral    Resp: 12 15 19 15   Height:      Weight:      SpO2:  99% 98% 94%   General:  A&O x 3, NAD, nontoxic, pleasant/cooperative Head/Eye: No conjunctival hemorrhage, no icterus, [place]/AT, No nystagmus ENT:  No icterus,  No thrush, good dentition, no pharyngeal exudate Neck:  No masses, no lymphadenpathy, no bruits CV:  RRR, no rub, no gallop, no S3 Lung:  CTAB, good air movement, no wheeze, no rhonchi Abdomen: soft/NT, +BS, nondistended, no peritoneal signs Ext: No cyanosis, No rashes, No petechiae, No lymphangitis, No edema Neuro: CNII-XII intact, strength 4-/5 in bilateral lower extremities, no dysmetria; 1/4 DTR achilles and patellar  Labs on Admission:  Basic Metabolic Panel:  Recent Labs Lab 12/20/14 1945  NA 139  K 3.7  CL 104  CO2 26  GLUCOSE 91  BUN 11  CREATININE 0.74  CALCIUM 8.8*   Liver Function Tests:  Recent Labs Lab 12/20/14 1945  AST 33  ALT 20  ALKPHOS 50  BILITOT 0.8  PROT 7.2  ALBUMIN 4.4    Recent Labs Lab 12/20/14 1945  LIPASE 33   No results for input(s): AMMONIA in the last 168 hours. CBC:  Recent Labs Lab 12/20/14 1945  WBC 7.6  NEUTROABS 5.3  HGB 13.0  HCT 40.7  MCV 89.1  PLT 227   Cardiac  Enzymes:  Recent Labs Lab 12/20/14 1945  TROPONINI <0.03   BNP: Invalid input(s): POCBNP CBG: No results for input(s): GLUCAP in the last 168 hours.  Radiological Exams on Admission: Dg Chest 2 View  12/20/2014   CLINICAL DATA:  Nausea, vomiting, diarrhea, weakness for 1 week  EXAM: CHEST  2 VIEW  COMPARISON:  11/28/2014  FINDINGS: There is no focal parenchymal opacity. There is no pleural effusion or pneumothorax. The heart and mediastinal contours are unremarkable.  Age indeterminate mild mid thoracic spine compression fracture.  IMPRESSION: No active cardiopulmonary disease.   Electronically Signed   By: Elige KoHetal  Patel   On: 12/20/2014 21:12    EKG: Independently reviewed. Poor quality    Time spent:60 minutes Code Status:   FULL Family Communication:  No  Family at bedside   Aniya Jolicoeur, DO  [facility] Pager [phone]  If 7PM-7AM, please contact night-coverage [email or web address] Password [email or web address] 12/20/2014, 10:47 PM

## 2014-12-20 NOTE — ED Provider Notes (Signed)
CSN: [id number]     Arrival date & time 12/20/14  1922 History   First MD Initiated Contact with Patient 12/20/14 1929     Chief Complaint  Patient presents with  . Weakness  . Diarrhea     (Consider location/radiation/quality/duration/timing/severity/associated sxs/prior Treatment) HPI Comments: Patient is difficult historian. She comes by EMS with complaints of generalized weakness and diarrhea for the past 2 weeks. She states she "can't keep anything in my stomach." She denies any vomiting but has had diarrhea every time she eats up to 3 or 4 times daily. His been nonbloody. She endorses nausea and poor appetite and "feeling off balance". She has a history of CHF, PE on Coumadin, recent admission for CHF exacerbation and chest pain. She denies any fevers or cough. Denies any urinary symptoms. She states she is in between PCPs. She states she normally walks with a walker but has not been able to for the past week due to weakness.  Patient is a 80 y.o. female presenting with weakness and diarrhea. The history is provided by the patient and the EMS personnel. The history is limited by the condition of the patient.  Weakness Associated symptoms include abdominal pain. Pertinent negatives include no chest pain and no shortness of breath.  Diarrhea Associated symptoms: abdominal pain, arthralgias and myalgias   Associated symptoms: no fever     Past Medical History  Diagnosis Date  . CHF (congestive heart failure)   . Venous stasis   . Parkinson's disease   . History of pulmonary embolism    History reviewed. No pertinent past surgical history. Family History  Problem Relation Age of Onset  . Heart disease Mother     90s   History  Substance Use Topics  . Smoking status: Never Smoker   . Smokeless tobacco: Not on file  . Alcohol Use: No   OB History    No data available     Review of Systems  Constitutional: Positive for activity change, appetite change and fatigue. Negative  for fever.  HENT: Negative for congestion and rhinorrhea.   Respiratory: Negative for cough, chest tightness and shortness of breath.   Cardiovascular: Positive for leg swelling. Negative for chest pain.  Gastrointestinal: Positive for nausea, abdominal pain and diarrhea.  Genitourinary: Negative for dysuria, hematuria, vaginal bleeding and vaginal discharge.  Musculoskeletal: Positive for myalgias and arthralgias.  Skin: Negative for rash.  Neurological: Positive for weakness and light-headedness. Negative for dizziness.    A complete 10 system review of systems was obtained and all systems are negative except as noted in the HPI and PMH.    Allergies  Contrast media; Vancomycin; Lasix; and Vioxx  Home Medications   Prior to Admission medications   Medication Sig Start Date End Date Taking? Authorizing Provider  acetaminophen (TYLENOL) 500 MG tablet Take 500 mg by mouth every 6 (six) hours as needed for mild pain or moderate pain.   Yes Historical Provider, MD  warfarin (COUMADIN) 2.5 MG tablet Take as directed by anticoagulation clinic. Patient taking differently: Take 2.5-3.75 mg by mouth See admin instructions. Take 2.5mg  daily except Friday, take 3.75mg  12/12/14  Yes Judie BonusElizabeth A Kollar, MD   BP 151/92 mmHg  Pulse 78  Temp(Src) 98.2 F (36.8 C) (Oral)  Resp 15  Ht 5\' 3"  (1.6 m)  Wt 140 lb (63.504 kg)  BMI 24.81 kg/m2  SpO2 94% Physical Exam  Constitutional: She is oriented to person, place, and time. She appears well-developed and well-nourished. No distress.  HENT:  Head: Normocephalic and atraumatic.  Mouth/Throat: Oropharynx is clear and moist. No oropharyngeal exudate.  Eyes: Conjunctivae and EOM are normal. Pupils are equal, round, and reactive to light.  Neck: Normal range of motion. Neck supple.  No meningismus.  Cardiovascular: Normal rate, regular rhythm, normal heart sounds and intact distal pulses.   No murmur heard. Pulmonary/Chest: Effort normal and breath  sounds normal. No respiratory distress.  Abdominal: Soft. There is no tenderness. There is no rebound and no guarding.  Musculoskeletal: Normal range of motion. She exhibits edema. She exhibits no tenderness.  Pitting edema to knees bilaterally. Intact distal pulses.  Neurological: She is alert and oriented to person, place, and time. No cranial nerve deficit. She exhibits normal muscle tone. Coordination normal.  Resting tremor, worse in the right upper extremity. 4/5 strength throughout. CN 2-12 intact.  Unable to stand or walk at side of bed  Skin: Skin is warm.  Psychiatric: She has a normal mood and affect. Her behavior is normal.  Nursing note and vitals reviewed.   ED Course  Procedures (including critical care time) Labs Review Labs Reviewed  COMPREHENSIVE METABOLIC PANEL - Abnormal; Notable for the following:    Calcium 8.8 (*)    All other components within normal limits  URINALYSIS, ROUTINE W REFLEX MICROSCOPIC (NOT AT [facility]) - Abnormal; Notable for the following:    Hgb urine dipstick TRACE (*)    Leukocytes, UA TRACE (*)    All other components within normal limits  PROTIME-INR - Abnormal; Notable for the following:    Prothrombin Time 37.6 (*)    INR 3.94 (*)    All other components within normal limits  URINE MICROSCOPIC-ADD ON - Abnormal; Notable for the following:    Squamous Epithelial / LPF FEW (*)    All other components within normal limits  CBC WITH DIFFERENTIAL/PLATELET  LIPASE, BLOOD  TROPONIN I  BRAIN NATRIURETIC PEPTIDE  PROTIME-INR  CBC    Imaging Review Dg Chest 2 View  12/20/2014   CLINICAL DATA:  Nausea, vomiting, diarrhea, weakness for 1 week  EXAM: CHEST  2 VIEW  COMPARISON:  11/28/2014  FINDINGS: There is no focal parenchymal opacity. There is no pleural effusion or pneumothorax. The heart and mediastinal contours are unremarkable.  Age indeterminate mild mid thoracic spine compression fracture.  IMPRESSION: No active cardiopulmonary disease.    Electronically Signed   By: Elige Ko   On: 12/20/2014 21:12     EKG Interpretation   Date/Time:  Thursday December 20 2014 19:59:33 EDT Ventricular Rate:  82 PR Interval:    QRS Duration: 128 QT Interval:  471 QTC Calculation: 550 R Axis:   -68 Text Interpretation:  Paired ventricular premature complexes LVH with  IVCD, LAD and secondary repol abnrm Prolonged QT interval Artifact in  lead(s) I II III aVR aVL aVF V1 V2 V3 V4 V5 V6 severe artifact Confirmed  by Manus Gunning  MD, Denajah Farias ([id number]) on 12/20/2014 8:39:55 PM      MDM   Final diagnoses:  Generalized weakness  Diarrhea  presents with generalized weakness, anorexia, fatigue and diarrhea. Recent hospitalization for CHF exacerbation. Does not take diuretics routinely. Her chest discomfort is atypical for ACS.  Labs appear to be at baseline. Creatinine stable. INR 3.9. No chest pain or shortness of breath in the ED. BNP mildly elevated. Patient with no echo in the system. She refused a during her last admission.  She is unable to stand for orthostatic vitals. She is unable to  ambulate or stand without assistance. She appears deconditioned but will need further investigation to elucidate the cause of her weakness. D/w Dr. Arbutus Leas.  Glynn Octave, MD 12/20/14 2322

## 2014-12-21 ENCOUNTER — Inpatient Hospital Stay (HOSPITAL_COMMUNITY): Payer: Medicare Other

## 2014-12-21 DIAGNOSIS — R0789 Other chest pain: Secondary | ICD-10-CM | POA: Diagnosis not present

## 2014-12-21 DIAGNOSIS — G20 Parkinson's disease: Secondary | ICD-10-CM | POA: Diagnosis not present

## 2014-12-21 DIAGNOSIS — Z86711 Personal history of pulmonary embolism: Secondary | ICD-10-CM | POA: Diagnosis not present

## 2014-12-21 DIAGNOSIS — Z7901 Long term (current) use of anticoagulants: Secondary | ICD-10-CM | POA: Diagnosis not present

## 2014-12-21 DIAGNOSIS — R531 Weakness: Secondary | ICD-10-CM

## 2014-12-21 DIAGNOSIS — I509 Heart failure, unspecified: Secondary | ICD-10-CM | POA: Diagnosis not present

## 2014-12-21 DIAGNOSIS — R197 Diarrhea, unspecified: Secondary | ICD-10-CM | POA: Diagnosis not present

## 2014-12-21 LAB — BASIC METABOLIC PANEL
ANION GAP: 6 (ref 5–15)
BUN: 9 mg/dL (ref 6–20)
CO2: 26 mmol/L (ref 22–32)
Calcium: 8.2 mg/dL — ABNORMAL LOW (ref 8.9–10.3)
Chloride: 110 mmol/L (ref 101–111)
Creatinine, Ser: 0.63 mg/dL (ref 0.44–1.00)
GFR calc Af Amer: >60 mL/min (ref >60)
GFR calc non Af Amer: >60 mL/min (ref >60)
Glucose, Bld: 87 mg/dL (ref 65–99)
Potassium: 3.5 mmol/L (ref 3.5–5.1)
Sodium: 142 mmol/L (ref 135–145)

## 2014-12-21 LAB — CK: Total CK: 502 U/L — ABNORMAL HIGH (ref 38–234)

## 2014-12-21 LAB — CORTISOL-AM, BLOOD: CORTISOL - AM: 13.2 ug/dL (ref 6.7–22.6)

## 2014-12-21 LAB — CBC
HCT: 37.7 % (ref 36.0–46.0)
HEMOGLOBIN: 12.1 g/dL (ref 12.0–15.0)
MCH: 28.6 pg (ref 26.0–34.0)
MCHC: 32.1 g/dL (ref 30.0–36.0)
MCV: 89.1 fL (ref 78.0–100.0)
Platelets: 223 10*3/uL (ref 150–400)
RBC: 4.23 MIL/uL (ref 3.87–5.11)
RDW: 13.4 % (ref 11.5–15.5)
WBC: 6 10*3/uL (ref 4.0–10.5)

## 2014-12-21 LAB — TSH: TSH: 1.814 u[IU]/mL (ref 0.350–4.500)

## 2014-12-21 LAB — VITAMIN B12: Vitamin B-12: 255 pg/mL (ref 180–914)

## 2014-12-21 LAB — PROTEIN / CREATININE RATIO, URINE
CREATININE, URINE: 37.09 mg/dL
Total Protein, Urine: <10 mg/dL

## 2014-12-21 LAB — MAGNESIUM: Magnesium: 2.1 mg/dL (ref 1.7–2.4)

## 2014-12-21 LAB — TROPONIN I: Troponin I: <0.03 ng/mL (ref <0.031)

## 2014-12-21 LAB — PROTIME-INR
INR: 4.34 — AB (ref 0.00–1.49)
Prothrombin Time: 40.4 seconds — ABNORMAL HIGH (ref 11.6–15.2)

## 2014-12-21 MED ORDER — WARFARIN - PHARMACIST DOSING INPATIENT: Status: DC

## 2014-12-21 NOTE — Progress Notes (Signed)
PT Cancellation Note  Patient Details Name: [name] MRN: [id number] DOB: 02/09/1935   Cancelled Treatment:    Reason Eval/Treat Not Completed: Other (comment).  I spent about 20 min offering my assistance in getting pt up to try to stand and walk.  She presented continual obstacles as to why she could not work with me-none of which were reasonable (i.e. Didn't have on any underpants, no clothes on, has a peripheral neuropathy, she thought that the OT would be with me to help and on and on and on).  Ultimately I gave up and recommended that she have 24 hour assist or live in a nursing home.  No PT was actually provided.    Myrlene BrokerBrown, Donyale Berthold L 12/21/2014, 3:12 PM

## 2014-12-21 NOTE — Discharge Summary (Signed)
Physician Discharge Summary  [name]:[id number] DOB: 02-17-1935 DOA: 12/20/2014  PCP: Judie Bonus, MD  Admit date: 12/20/2014 Discharge date: 12/21/2014  Time spent: 45 minutes  Recommendations for Outpatient Follow-up:  -Will be discharged home today. -Advised to follow-up with primary care provider in 2 weeks.\  Discharge Diagnoses:  Active Problems:   Generalized weakness   LEG EDEMA   PULMONARY EMBOLISM, HX OF   Parkinson's disease   Leg weakness   Lower extremity weakness   Precordial pain   Weakness   Discharge Condition: Stable  Filed Weights   12/20/14 1935  Weight: 63.504 kg (140 lb)    History of present illness:  80 year old female with a history of PE on warfarin, Parkinson's disease presents with one-day history of generalized weakness and lower extremity weakness. At baseline, the patient states that she is able to get around with a walker although she needs some assistance with transfers. Patient also complains of some intermittent chest discomfort and shortness of breath mostly at rest. The patient had similar complaints on her last admission from which she was discharged on 11/30/2014. The patient refused an echocardiogram and extensive workup at that time. Lower extremity duplex was negative for DVT. The patient was sent home on torsemide, but the patient has not taken this medication. She states that she has chronic lower extremity edema which she feels is about the same as usual. She denies any fevers, chills, headache, visual disturbance, focal extremity weakness, dysarthria, vomiting, abdominal pain, dysuria, hematuria. She's been complaining of loose stools on a daily basis for the past week without any hematochezia or melena. She has intermittent abdominal cramping with her loose stools. There has been no recent antibiotics. In the emergency department, the patient was afebrile and hemodynamically stable. She was saturation was 100% on room  air. BMP and CBC were unremarkable. Hepatic enzymes were unremarkable. INR was 3.94. EKG revealed a lot of artifact secondary to the patient's tremor. Initial troponin was negative.  Hospital Course:   Bilateral lower extremity weakness -TSH/B 12 within normal limits. -No electrolyte abnormalities. -MRI L spine was ordered on admission, however patient has refused. -Patient explained that no neurology coverage for the next 4 days at the hospital, and the fact that I did not believe that an inpatient neurology consultation was required. -Patient has been very aggressive with staff, has refused multiple treatments, refuses testing that is recommended by [place], has refused to work with physical therapy, refused to listen to case management and social work explained to her the intricacies of an observation admission.  -I do not see anything at this point that warrants an inpatient admission, given patient's refusal for further workup and refusal to participate with therapy, I do not see how her continued admission will be beneficial at this time and has been discharged home.  Procedures:  None   Consultations:  None  Discharge Instructions  Discharge Instructions    Increase activity slowly    Complete by:  As directed             Medication List    TAKE these medications        acetaminophen 500 MG tablet  Commonly known as:  TYLENOL  Take 500 mg by mouth every 6 (six) hours as needed for mild pain or moderate pain.     warfarin 2.5 MG tablet  Commonly known as:  COUMADIN  Take as directed by anticoagulation clinic.       Allergies  Allergen Reactions  . Contrast Media [Iodinated Diagnostic Agents] Anaphylaxis and Shortness Of Breath  . Vancomycin Shortness Of Breath    REACTION: SOB  . Lasix [Furosemide]   . Vioxx [Rofecoxib]     REACTION: irregular heartbeat       Follow-up Information    Follow up with Judie Bonus, MD. Schedule an appointment as soon as  possible for a visit in 2 weeks.   Specialty:  Internal Medicine   Contact information:   [address] [place] [address] [phone]        The results of significant diagnostics from this hospitalization (including imaging, microbiology, ancillary and laboratory) are listed below for reference.    Significant Diagnostic Studies: Dg Chest 1 View  11/28/2014   CLINICAL DATA:  Short of breath and weakness. Initial encounter. No injury to the chest or fever.  EXAM: CHEST  1 VIEW  COMPARISON:  08/12/2009.  FINDINGS: The superior chest on the RIGHT is obscured by the patient's head. The cardiopericardial silhouette appears within normal limits for projection. No airspace disease or pleural effusion. Monitoring leads project over the chest. Age related calcification of the tracheobronchial tree. Patient is rotated to the RIGHT. Linear scarring is present in the LEFT lower lobe, projected over the cardiopericardial silhouette.  IMPRESSION: No acute cardiopulmonary disease. Exam degraded by projection and patient position.   Electronically Signed   By: Andreas Newport M.D.   On: 11/28/2014 16:44   Dg Chest 2 View  12/20/2014   CLINICAL DATA:  Nausea, vomiting, diarrhea, weakness for 1 week  EXAM: CHEST  2 VIEW  COMPARISON:  11/28/2014  FINDINGS: There is no focal parenchymal opacity. There is no pleural effusion or pneumothorax. The heart and mediastinal contours are unremarkable.  Age indeterminate mild mid thoracic spine compression fracture.  IMPRESSION: No active cardiopulmonary disease.   Electronically Signed   By: Elige Ko   On: 12/20/2014 21:12   US Venous Img Lower Bilateral  11/28/2014   CLINICAL DATA:  BILATERAL lower extremity swelling. History of pulmonary emboli. Symptoms for a few weeks.  EXAM: BILATERAL LOWER EXTREMITY VENOUS DOPPLER ULTRASOUND  TECHNIQUE: Gray-scale sonography with graded compression, as well as color Doppler and duplex ultrasound were performed to evaluate  the lower extremity deep venous systems from the level of the common femoral vein and including the common femoral, femoral, profunda femoral, popliteal and calf veins including the posterior tibial, peroneal and gastrocnemius veins when visible. The superficial great saphenous vein was also interrogated. Spectral Doppler was utilized to evaluate flow at rest and with distal augmentation maneuvers in the common femoral, femoral and popliteal veins.  COMPARISON:  None.  FINDINGS: RIGHT LOWER EXTREMITY  Common Femoral Vein: No evidence of thrombus. Normal compressibility, respiratory phasicity and response to augmentation.  Saphenofemoral Junction: No evidence of thrombus. Normal compressibility and flow on color Doppler imaging.  Profunda Femoral Vein: No evidence of thrombus. Normal compressibility and flow on color Doppler imaging.  Femoral Vein: No evidence of thrombus. Normal compressibility, respiratory phasicity and response to augmentation.  Popliteal Vein: No evidence of thrombus. Normal compressibility, respiratory phasicity and response to augmentation.  Calf Veins: No evidence of thrombus. Normal compressibility and flow on color Doppler imaging.  Superficial Great Saphenous Vein: No evidence of thrombus. Normal compressibility and flow on color Doppler imaging.  Venous Reflux:  None.  Other Findings:  None.  LEFT LOWER EXTREMITY  Common Femoral Vein: No evidence of thrombus. Normal compressibility, respiratory phasicity and response  to augmentation.  Saphenofemoral Junction: No evidence of thrombus. Normal compressibility and flow on color Doppler imaging.  Profunda Femoral Vein: No evidence of thrombus. Normal compressibility and flow on color Doppler imaging.  Femoral Vein: No evidence of thrombus. Normal compressibility, respiratory phasicity and response to augmentation.  Popliteal Vein: No evidence of thrombus. Normal compressibility, respiratory phasicity and response to augmentation.  Calf Veins:  No evidence of thrombus. Normal compressibility and flow on color Doppler imaging.  Superficial Great Saphenous Vein: No evidence of thrombus. Normal compressibility and flow on color Doppler imaging.  Venous Reflux:  None.  Other Findings:  BILATERAL lower extremity edema.  IMPRESSION: No evidence of BILATERAL deep venous thrombosis.   Electronically Signed   By: Davonna BellingJohn  Curnes M.D.   On: 11/28/2014 18:33    Microbiology: No results found for this or any previous visit (from the past 240 hour(s)).   Labs: Basic Metabolic Panel:  Recent Labs Lab 12/20/14 1945 12/21/14 0622  NA 139 142  K 3.7 3.5  CL 104 110  CO2 26 26  GLUCOSE 91 87  BUN 11 9  CREATININE 0.74 0.63  CALCIUM 8.8* 8.2*  MG  --  2.1   Liver Function Tests:  Recent Labs Lab 12/20/14 1945  AST 33  ALT 20  ALKPHOS 50  BILITOT 0.8  PROT 7.2  ALBUMIN 4.4    Recent Labs Lab 12/20/14 1945  LIPASE 33   No results for input(s): AMMONIA in the last 168 hours. CBC:  Recent Labs Lab 12/20/14 1945 12/21/14 0622  WBC 7.6 6.0  NEUTROABS 5.3  --   HGB 13.0 12.1  HCT 40.7 37.7  MCV 89.1 89.1  PLT 227 223   Cardiac Enzymes:  Recent Labs Lab 12/20/14 1945 12/21/14 0020 12/21/14 0622 12/21/14 1153  CKTOTAL  --  502*  --   --   TROPONINI <0.03 <0.03 <0.03 <0.03   BNP: BNP (last 3 results)  Recent Labs  11/28/14 1617 12/20/14 1945  BNP 238.0* 89.0    ProBNP (last 3 results) No results for input(s): PROBNP in the last 8760 hours.  CBG: No results for input(s): GLUCAP in the last 168 hours.     SignedChaya Jan:  HERNANDEZ Green,Tara  [facility] Pager: [phone] 12/21/2014, 7:17 PM

## 2014-12-21 NOTE — Progress Notes (Signed)
NURSING PROGRESS NOTE  Sherald Bargeeggy J Klonowski [id number] Discharge Data: 12/21/2014 7:06 PM Attending Provider: No att. providers found ZHY:QMVHQIPCP:Kollar, Austin MilesElizabeth A, MD   Sherald BargePeggy J Brecheisen to be D/C'd Home per MD order.    All IV's discontinued and monitored for bleeding.  All belongings returned to patient for patient to take home.  Attempted to review AVS summary with patient; however, patient refused to let me explain paperwork. Patient gave alternate address to have EMS bring her. Notified EMS.   Patient left floor via stretcher, escorted by EMS.  Last Documented Vital Signs:  Blood pressure 139/61, pulse 83, temperature 98.8 F (37.1 C), temperature source Oral, resp. rate 16, height 5\' 3"  (1.6 m), weight 63.504 kg (140 lb), SpO2 100 %.  Mertha BaarsHorine, Keldan Eplin D

## 2014-12-21 NOTE — Progress Notes (Signed)
ANTICOAGULATION CONSULT NOTE - follow up  Pharmacy Consult for Coumadin (chronic Rx PTA) Indication: VTE Treatment, H/O PE  Allergies  Allergen Reactions  . Contrast Media [Iodinated Diagnostic Agents] Anaphylaxis and Shortness Of Breath  . Vancomycin Shortness Of Breath    REACTION: SOB  . Lasix [Furosemide]   . Vioxx [Rofecoxib]     REACTION: irregular heartbeat   Patient Measurements: Height: 5\' 3"  (160 cm) Weight: 140 lb (63.504 kg) IBW/kg (Calculated) : 52.4   Vital Signs: Temp: 98.8 F (37.1 C) (07/01 0031) Temp Source: Oral (07/01 0031) BP: 139/61 mmHg (07/01 0031) Pulse Rate: 83 (07/01 0031)  Labs:  Recent Labs  12/20/14 1945 12/21/14 0020 12/21/14 0622  HGB 13.0  --  12.1  HCT 40.7  --  37.7  PLT 227  --  223  LABPROT 37.6*  --  40.4*  INR 3.94*  --  4.34*  CREATININE 0.74  --  0.63  CKTOTAL  --  502*  --   TROPONINI <0.03 <0.03 <0.03   Estimated Creatinine Clearance: 52 mL/min (by C-G formula based on Cr of 0.63).  Medical History: Past Medical History  Diagnosis Date  . CHF (congestive heart failure)   . Venous stasis   . Parkinson's disease   . History of pulmonary embolism    Medications:  Prescriptions prior to admission  Medication Sig Dispense Refill Last Dose  . acetaminophen (TYLENOL) 500 MG tablet Take 500 mg by mouth every 6 (six) hours as needed for mild pain or moderate pain.   12/19/2014 at Unknown time  . warfarin (COUMADIN) 2.5 MG tablet Take as directed by anticoagulation clinic. (Patient taking differently: Take 2.5-3.75 mg by mouth See admin instructions. Take 2.5mg  daily except Friday, take 3.75mg ) 35 tablet 0 12/19/2014 at 21-2200   Assessment: Continuation of coumadin PTA, history of PE INR elevated > 4 today  Goal of Therapy:  INR 2-3 Monitor platelets by anticoagulation protocol: Yes   Plan:  No Coumadin tonight due to elevated INR INR/PT daily Labs per protocol  Valrie HartHall, Rahkeem Senft A 12/21/2014,11:04 AM

## 2014-12-21 NOTE — Progress Notes (Addendum)
Patient insists upon eating grilled cheese sandwich. Dietary explained multiple times that it is not recommended for a heart healthy diet, but patient demands sandwich. Patient educated, but given sandwich.  Also, patient stated her heels were sore. No abrasions or ulcers noted upon assessment. Offered patient a pillow and heel protectors to use, but patient refused both. Patient complained of pain. Offered her tylenol, but she also refused this.

## 2014-12-21 NOTE — Progress Notes (Signed)
OT Cancellation Note  Patient Details Name: [name] MRN: [id number] DOB: 08/28/1934   Cancelled Treatment:     Reason evaluation not completed: Pt refused to participate in OT evaluation this am, reporting she has been very sick and just needs to concentrate on eating.   Ezra SitesLeslie Troxler, OTR/L  [phone]  12/21/2014, 9:06 AM

## 2014-12-21 NOTE — Care Management Note (Signed)
Case Management Note  Patient Details  Name: [name] MRN: [id number] Date of Birth: 07/12/1934  Expected Discharge Date:                  Expected Discharge Plan:  Home/Self Care  In-House Referral:  NA  Discharge planning Services  CM Consult  Post Acute Care Choice:  NA Choice offered to:  NA  DME Arranged:    DME Agency:     HH Arranged:    HH Agency:     Status of Service:  Completed, signed off  Medicare Important Message Given:    Date Medicare IM Given:    Medicare IM give by:    Date Additional Medicare IM Given:    Additional Medicare Important Message give by:     If discussed at Long Length of Stay Meetings, dates discussed:    Additional Comments: Patient is from home alone with private duty sitters. Patient states she is unable to walk and says she needs to go "somewhere". Patient uncooperative with staff and very upset "we aren't doing anything for her". Patient explaining we "haven't got her up to walk all day" but also very addement something is wrong with her because she cannot walk. Explained to patient that she does not meet inpatient status and is observation. Pt has been given code 4344, it was explained to patient by CM, with Tretha SciaraHeather Settle, CSW as a witness. Patient refused to sign and later called CM back in to read it to her. CM read code 5744 obs notification to pt with RN and NT in room. Pt is very upset she cannot go to SNF, patient is unable to pay privately and does not have a 3 day inpt stay. Pt refusing HH services stating "it will do no good!". Patient being discharged home today. Since patient is unable to get out of bed, EMS will be arranged by staff for transport. Patient will discharge back home with resumption of her [employer] care providers.   Malcolm Metrohildress, Rhonna Holster Demske, RN 12/21/2014, 4:21 PM

## 2014-12-21 NOTE — Progress Notes (Signed)
Patient reported she was not having MRI, will leave sticky note for attending physician, no stool since admission, general hand tremors.

## 2015-01-08 ENCOUNTER — Ambulatory Visit (INDEPENDENT_AMBULATORY_CARE_PROVIDER_SITE_OTHER): Payer: Medicare Other | Admitting: General Practice

## 2015-01-08 ENCOUNTER — Other Ambulatory Visit: Payer: Self-pay | Admitting: General Practice

## 2015-01-08 DIAGNOSIS — Z7901 Long term (current) use of anticoagulants: Secondary | ICD-10-CM

## 2015-01-08 DIAGNOSIS — Z5181 Encounter for therapeutic drug level monitoring: Secondary | ICD-10-CM

## 2015-01-08 DIAGNOSIS — I2699 Other pulmonary embolism without acute cor pulmonale: Secondary | ICD-10-CM

## 2015-01-08 LAB — POCT INR: INR: 3.6

## 2015-01-08 MED ORDER — WARFARIN SODIUM 2.5 MG PO TABS: ORAL_TABLET | ORAL | Status: DC

## 2015-01-08 NOTE — Progress Notes (Signed)
I have reviewed and agree with the plan. 

## 2015-01-08 NOTE — Progress Notes (Signed)
Pre visit review using our clinic review tool, if applicable. No additional management support is needed unless otherwise documented below in the visit note. 

## 2015-02-19 ENCOUNTER — Ambulatory Visit: Payer: Medicare Other

## 2015-03-12 ENCOUNTER — Ambulatory Visit: Payer: Medicare Other | Admitting: Internal Medicine

## 2015-06-04 ENCOUNTER — Other Ambulatory Visit: Payer: Self-pay | Admitting: General Practice

## 2015-06-04 MED ORDER — WARFARIN SODIUM 2.5 MG PO TABS: ORAL_TABLET | ORAL | Status: DC

## 2015-06-20 ENCOUNTER — Ambulatory Visit: Payer: Medicare Other | Admitting: Internal Medicine

## 2015-06-28 ENCOUNTER — Ambulatory Visit: Payer: Medicare Other | Admitting: Internal Medicine

## 2015-11-27 ENCOUNTER — Telehealth: Payer: Self-pay | Admitting: Internal Medicine

## 2015-11-27 ENCOUNTER — Other Ambulatory Visit: Payer: Self-pay | Admitting: General Practice

## 2015-11-27 MED ORDER — WARFARIN SODIUM 2.5 MG PO TABS: ORAL_TABLET | ORAL | Status: DC

## 2015-11-27 NOTE — Telephone Encounter (Signed)
2 week supply of coumadin call [facility] [address].  LMOVM

## 2015-11-27 NOTE — Telephone Encounter (Signed)
Pt called to schedule a physical and she is not sure how much warfarin she has left and is wondering if you can send enough in till her appt. I expressed the importance of coming in for her appt.  [facility] [address]

## 2015-12-10 ENCOUNTER — Ambulatory Visit: Payer: Medicare Other | Admitting: Internal Medicine

## 2015-12-27 ENCOUNTER — Ambulatory Visit: Payer: Medicare Other

## 2015-12-27 ENCOUNTER — Ambulatory Visit: Payer: Medicare Other | Admitting: Internal Medicine

## 2016-05-12 ENCOUNTER — Ambulatory Visit: Payer: Self-pay | Admitting: General Practice

## 2016-10-28 ENCOUNTER — Emergency Department (HOSPITAL_COMMUNITY): Payer: Medicare Other

## 2016-10-28 ENCOUNTER — Emergency Department (HOSPITAL_COMMUNITY)
Admission: EM | Admit: 2016-10-28 | Discharge: 2016-10-29 | Disposition: A | Discharge status: Home / Self Care | Payer: Medicare Other | Attending: Emergency Medicine | Admitting: Emergency Medicine

## 2016-10-28 ENCOUNTER — Encounter (HOSPITAL_COMMUNITY): Payer: Self-pay

## 2016-10-28 DIAGNOSIS — G20 Parkinson's disease: Secondary | ICD-10-CM | POA: Insufficient documentation

## 2016-10-28 DIAGNOSIS — Z7901 Long term (current) use of anticoagulants: Secondary | ICD-10-CM | POA: Diagnosis not present

## 2016-10-28 DIAGNOSIS — I110 Hypertensive heart disease with heart failure: Secondary | ICD-10-CM | POA: Diagnosis not present

## 2016-10-28 DIAGNOSIS — I5032 Chronic diastolic (congestive) heart failure: Secondary | ICD-10-CM | POA: Diagnosis not present

## 2016-10-28 DIAGNOSIS — Z79899 Other long term (current) drug therapy: Secondary | ICD-10-CM | POA: Insufficient documentation

## 2016-10-28 DIAGNOSIS — R072 Precordial pain: Secondary | ICD-10-CM | POA: Diagnosis present

## 2016-10-28 DIAGNOSIS — R079 Chest pain, unspecified: Secondary | ICD-10-CM

## 2016-10-28 LAB — CBC
HEMATOCRIT: 36.3 % (ref 36.0–46.0)
Hemoglobin: 11.6 g/dL — ABNORMAL LOW (ref 12.0–15.0)
MCH: 27.6 pg (ref 26.0–34.0)
MCHC: 32 g/dL (ref 30.0–36.0)
MCV: 86.4 fL (ref 78.0–100.0)
PLATELETS: 178 10*3/uL (ref 150–400)
RBC: 4.2 MIL/uL (ref 3.87–5.11)
RDW: 14.1 % (ref 11.5–15.5)
WBC: 8 10*3/uL (ref 4.0–10.5)

## 2016-10-28 LAB — BASIC METABOLIC PANEL
Anion gap: 7 (ref 5–15)
BUN: 12 mg/dL (ref 6–20)
CHLORIDE: 109 mmol/L (ref 101–111)
CO2: 22 mmol/L (ref 22–32)
Calcium: 8.5 mg/dL — ABNORMAL LOW (ref 8.9–10.3)
Creatinine, Ser: 0.8 mg/dL (ref 0.44–1.00)
GFR calc non Af Amer: >60 mL/min (ref >60)
Glucose, Bld: 86 mg/dL (ref 65–99)
Potassium: 3.7 mmol/L (ref 3.5–5.1)
SODIUM: 138 mmol/L (ref 135–145)

## 2016-10-28 LAB — PROTIME-INR
INR: 2.25
PROTHROMBIN TIME: 25.3 s — AB (ref 11.4–15.2)

## 2016-10-28 LAB — I-STAT TROPONIN, ED
TROPONIN I, POC: 0.01 ng/mL (ref 0.00–0.08)
Troponin i, poc: 0 ng/mL (ref 0.00–0.08)

## 2016-10-28 LAB — BRAIN NATRIURETIC PEPTIDE: B Natriuretic Peptide: 129.5 pg/mL — ABNORMAL HIGH (ref 0.0–100.0)

## 2016-10-28 MED ORDER — WARFARIN - PHYSICIAN DOSING INPATIENT: Freq: Every day | Status: DC

## 2016-10-28 MED ORDER — WARFARIN SODIUM 2.5 MG PO TABS
2.5000 mg | ORAL_TABLET | Freq: Once | ORAL | Status: AC
  Administered 2016-10-28: 2.5 mg via ORAL
  Filled 2016-10-28: qty 1

## 2016-10-28 MED ORDER — ASPIRIN 81 MG PO CHEW
324.0000 mg | CHEWABLE_TABLET | Freq: Once | ORAL | Status: DC
Start: 2016-10-28 — End: 2016-10-29
  Filled 2016-10-28: qty 4

## 2016-10-28 NOTE — ED Provider Notes (Signed)
[facility]ER[facility]ENCY DEPT Provider Note   CSN: [id number] Arrival date & time: 10/28/16  1717     History   Chief Complaint Chief Complaint  Patient presents with  . Chest Pain    HPI [name] is a 82 y.o. female.  The history is provided by the patient.  Chest Pain   This is a new problem. The current episode started 6 to 12 hours ago. The problem occurs rarely. The problem has been resolved. The pain is associated with rest. The pain is present in the substernal region. The pain is at a severity of 1/10. The pain is mild. The quality of the pain is described as pressure-like. The pain does not radiate. Associated symptoms include lower extremity edema (worse over last several days) and shortness of breath. Pertinent negatives include no abdominal pain, no back pain, no cough, no diaphoresis, no fever, no headaches, no irregular heartbeat, no leg pain, no malaise/fatigue, no near-syncope, no numbness, no orthopnea, no palpitations, no sputum production, no syncope, no vomiting and no weakness. She has tried nothing for the symptoms. Risk factors include being elderly.  Her past medical history is significant for arrhythmia (afib), CHF and PE.  Pertinent negatives for past medical history include no hyperlipidemia, no hypertension and no seizures.    Past Medical History:  Diagnosis Date  . CHF (congestive heart failure) (HCC)   . History of pulmonary embolism   . Parkinson's disease (HCC)   . Venous stasis     Patient Active Problem List   Diagnosis Date Noted  . Weakness 12/21/2014  . Leg weakness 12/20/2014  . Lower extremity weakness 12/20/2014  . Precordial pain 12/20/2014  . Diarrhea   . SOB (shortness of breath) 11/29/2014  . Chest pain 11/28/2014  . Pressure ulcer, heel 11/28/2014  . AKI (acute kidney injury) (HCC) 11/28/2014  . Chronic diastolic congestive heart failure (HCC) 11/28/2014  . Parkinson's disease (HCC) 11/28/2014  . Pain in the chest   . Encounter  for therapeutic drug monitoring 09/15/2013  . Long term (current) use of anticoagulants 09/22/2010  . Pulmonary embolism (HCC) 08/25/2010  . Generalized weakness 08/16/2009  . LEG EDEMA 08/16/2009  . CONGESTIVE HEART FAILURE, HX OF 08/16/2009  . Venous (peripheral) insufficiency 11/15/2008  . PULMONARY EMBOLISM, HX OF 11/15/2008    History reviewed. No pertinent surgical history.  OB History    No data available       Home Medications    Prior to Admission medications   Medication Sig Start Date End Date Taking? Authorizing Provider  acetaminophen (TYLENOL) 500 MG tablet Take 1,200 mg by mouth every 6 (six) hours as needed for mild pain or moderate pain.    Yes [provider]  warfarin (COUMADIN) 2.5 MG tablet Take as directed by anticoagulation clinic. Patient taking differently: Take 2.5-5 mg by mouth daily at 6 PM. 2.5mg  on [facility] and 4mg  on FRI or Take as directed by anticoagulation clinic. 11/27/15  Yes Myrlene Broker, MD    Family History Family History  Problem Relation Age of Onset  . Heart disease Mother     75s    Social History Social History  Substance Use Topics  . Smoking status: Never Smoker  . Smokeless tobacco: Not on file  . Alcohol use No     Allergies   Contrast media [iodinated diagnostic agents]; Vancomycin; Lasix [furosemide]; and Vioxx [rofecoxib]   Review of Systems Review of Systems  Constitutional: Negative for  chills, diaphoresis, fever and malaise/fatigue.  HENT: Negative for ear pain and sore throat.   Eyes: Negative for pain and visual disturbance.  Respiratory: Positive for shortness of breath. Negative for cough and sputum production.   Cardiovascular: Positive for chest pain and leg swelling. Negative for palpitations, orthopnea, syncope and near-syncope.  Gastrointestinal: Negative for abdominal pain and vomiting.  Genitourinary: Negative for dysuria and hematuria.  Musculoskeletal: Negative for  arthralgias and back pain.  Skin: Negative for color change and rash.  Neurological: Negative for seizures, syncope, weakness, numbness and headaches.  All other systems reviewed and are negative.    Physical Exam  ED Triage Vitals  Enc Vitals Group     BP 10/28/16 1800 (!) 159/69     Pulse Rate 10/28/16 1800 83     Resp 10/28/16 1800 16     Temp 10/28/16 1748 99.5 F (37.5 C)     Temp Source 10/28/16 1748 Oral     SpO2 10/28/16 1800 97 %     Weight --      Height --      Head Circumference --      Peak Flow --      Pain Score 10/28/16 1720 6     Pain Loc --      Pain Edu? --      Excl. in GC? --     Physical Exam  Constitutional: She is oriented to person, place, and time. No distress.  Chronically ill appearing  HENT:  Head: Normocephalic and atraumatic.  Eyes: Conjunctivae are normal. Pupils are equal, round, and reactive to light.  Neck: Neck supple.  Cardiovascular: An irregularly irregular rhythm present.  No murmur heard. Pulmonary/Chest: Effort normal and breath sounds normal. No respiratory distress.  Abdominal: Soft. There is no tenderness.  Musculoskeletal: She exhibits edema (3+ pitting edema b/l).  Resting tremor, upper body with contractures including neck  Neurological: She is alert and oriented to person, place, and time.  Skin: Skin is warm and dry. Capillary refill takes less than 2 seconds.  Chronic venous stasis changes in b/l lower extremities, right heal ulcer with clear drainage and no signs of infection   Psychiatric: She has a normal mood and affect.  Nursing note and vitals reviewed.    ED Treatments / Results  Labs (all labs ordered are listed, but only abnormal results are displayed) Labs Reviewed  BASIC METABOLIC PANEL - Abnormal; Notable for the following:       Result Value   Calcium 8.5 (*)    All other components within normal limits  CBC - Abnormal; Notable for the following:    Hemoglobin 11.6 (*)    All other components  within normal limits  PROTIME-INR - Abnormal; Notable for the following:    Prothrombin Time 25.3 (*)    All other components within normal limits  BRAIN NATRIURETIC PEPTIDE - Abnormal; Notable for the following:    B Natriuretic Peptide 129.5 (*)    All other components within normal limits  PROTIME-INR  I-STAT TROPOININ, ED  I-STAT TROPOININ, ED    EKG  EKG Interpretation  Date/Time:  Wednesday Oct 28 2016 17:38:55 EDT Ventricular Rate:  88 PR Interval:    QRS Duration: 125 QT Interval:  417 QTC Calculation: 505 R Axis:   -57 Text Interpretation:  Atrial flutter Nonspecific IVCD with LAD Consider anterior infarct Artifact in lead(s) I II III aVR aVL aVF V1 V2 V5 and baseline wander in lead(s) V3 No significant change  since last tracing Confirmed by Frederick Peers [phone]) on 10/28/2016 5:49:14 PM       Radiology Dg Chest Portable 1 View  Result Date: 10/28/2016 CLINICAL DATA:  Acute onset of generalized chest tightness. Initial encounter. EXAM: PORTABLE CHEST 1 VIEW COMPARISON:  Chest radiograph performed 12/20/2014 FINDINGS: The lungs are well-aerated. Mild left basilar atelectasis or scarring is noted. There is no evidence of pleural effusion or pneumothorax. There is obscuration of the lung apices by the patient's head. The cardiomediastinal silhouette is mildly enlarged. No acute osseous abnormalities are seen. IMPRESSION: Mild left basilar atelectasis or scarring noted. Lungs otherwise clear. Mild cardiomegaly. Electronically Signed   By: Roanna Raider M.D.   On: 10/28/2016 18:20    Procedures Procedures (including critical care time)  Medications Ordered in ED Medications  aspirin chewable tablet 324 mg (324 mg Oral Not Given 10/28/16 1839)  Warfarin - Physician Dosing Inpatient (not administered)  warfarin (COUMADIN) tablet 2.5 mg (2.5 mg Oral Given 10/28/16 2113)     Initial Impression / Assessment and Plan / ED Course  I have reviewed the triage vital signs and the  nursing notes.  Pertinent labs & imaging results that were available during my care of the patient were reviewed by me and considered in my medical decision making (see chart for details).     ALEXICA SCHLOSSBERG is an 82 year old female with history of atrial fibrillation, congestive heart failure, Parkinson's disease, PE on Coumadin who presents to ED with chest tightness. Patient's vitals at time of arrival to the ED are unremarkable and patient is without fever. Patient with chest tightness that started today that has resolved on its own without any medications. Patient states that she has had increasing leg swelling over the last several days. Patient has Parkinson's but does not take any medications and does normally ambulate with assistance and with a walker. Patient has multiple caregivers at home that help her around the clock except for overnight. Patient states she had a stress test several years ago that was unremarkable. She denies smoking history, high cholesterol, hypertension. She has had a PE several years ago and was started on Coumadin. Patient denies abdominal pain, nausea, vomiting, shortness of breath. She does have increased pain in her legs likely secondary to fluid. On exam, patient is chronically ill-appearing and has contractures of her upper body and has resting tremor. Patient has 3+ pitting edema bilaterally with chronic venous stasis changes over her bilateral lower legs. Patient has good pulses throughout. Patient is in A. fib but has normal rate. Concern for ACS versus congestive heart failure vs chronic venous stasis diseaase. Will get CBC, EKG, BNP, BMP, troponin, chest x-ray.  Patient with EKG that shows atrial fibrillation with a lot of background artifact. No signs of acute ischemia otherwise. Patient with INR goal at 2.25 and not hypoxic and short of breath, doubt PE. Patient with 2 troponins within normal limits and doubt ACS. Patient otherwise with hemoglobin at 11.6 and  no significant anemia. No leukocytosis and chest x-ray with no signs of pneumonia and doubt infectious process. Patient with no signs of vascular congestion on chest x-ray and BNP mildly elevated at 129 and patient likely with chronic venous stasis disease as cause of swelling in the legs. Patient otherwise with unremarkable BNP. Patient with multiple chronic medical problems with Parkinson's being her primary issue. Believe chest pain likely from costochondritis and from chronic contractures. Patient had right lower extremity ulcer with no signs of infection and was  wrapped and wound care provider. Patient recommended to follow up with primary care provider and recommended compression stockings to lower extremities. Recommend follow-up with neurology to discuss possibile Parkinson's medications. Patient has refused parkinsons medication in the past. Patient states that she is unable to find anybody to care for her at home as it is too late. Patient was discharged but will remain in the ED until transport can be arranged home and until home nurse is able to get the patient home. Patient given home coumadin dose and signed out to Dr. Preston FleetingGlick with patient pending transfer to home once home health nurse is able to arrive to patients house.    Final Clinical Impressions(s) / ED Diagnoses   Final diagnoses:  Nonspecific chest pain    New Prescriptions New Prescriptions   No medications on file     Virgina NorfolkCuratolo, Norleen Xie, DO 10/29/16 0001    Little, Ambrose Finlandachel Morgan, MD 10/29/16 [phone]

## 2016-10-28 NOTE — ED Notes (Addendum)
This RN answered the call light. This RN asked the pt how she could help her. The pt responded "who is this?" The RN responded "this is your Nurse, how can I help you." The pt responded "I don't want you to help me I want someone else."  This RN immediately removed herself from the situation.

## 2016-10-28 NOTE — ED Triage Notes (Signed)
Pt brought in by EMS due to having chest tightness. Pt has hx of a.fib and CHF. Pt a&ox4.

## 2016-10-28 NOTE — ED Notes (Signed)
Pt incontinent of urine. Peri care performed and new incontinent pad placed.

## 2016-10-28 NOTE — ED Notes (Signed)
Pt states that she could not see the RN. The RN informed the patient that she was documenting and would come closer momentarily. The RN went to the pt bedside. The pt stated that she still could not see the RN. This RN then raised to height of the bed to a higher level. The pt continued to say that she could not see the RN and requested that she bend over. This RN informed the pt that she would not move over the side of the bed because it would not be proper body mechanics. The pt then requested that the RN send someone who could. The RN informed that pt that the bed was now at the highest level and the pt should be able to see her. The pt stated "I can't lift my head." The RN was able to maintain eye contact with the pt. The to then stated "so are you telling me to go to hell?" This RN informed the pt that this was not the case.  Pt bed lowered back down to the lowest level before leaving the room.

## 2016-10-28 NOTE — Discharge Instructions (Signed)
Consider using compression stockings to lower extremities to help with swelling in legs. Follow up with neurology as well for discussion about parkinsons treatment.

## 2016-10-29 MED ORDER — WARFARIN SODIUM 2.5 MG PO TABS: 2.5000 mg | ORAL_TABLET | Freq: Every day | ORAL | 0 refills | Status: DC

## 2016-10-29 NOTE — ED Notes (Addendum)
This charge RN asked to speak with patient. Upon entering, patient glared at me and stated "who are you?" in a harsh tone. Prior to my entering patient was screaming loudly. Introduced myself and inquired as to what was inspiring her to yell so loudly. Patient immediately stated "my neck needs to be adjusted, and they won't help me", "I want that dressing off of my leg", "I need a dressing on that heel", "my legs are uncomfortable", "everybody has been so mean here", "they all lied to me", and "when will I be transferred upstairs". This RN attempted to meet patient's needs; but each request which was met was replaced with another different request. Advised patient that she is not and has not been slated for admission at any point during this stay. Patient explained that she is frustrated as she was told several conflicting things about her disposition. She went on to explain that there is no way she can get a caregiver to meet her at home at this time. Advised patient that the current plan is to get her transferred to her home in the AM when her caregivers are available and that she will remain here in the meantime. Patient then stated "well ya'll should just admit me then". Advised patient that MDs could find no admission criteria which she meets at this time. Patient then requested prescription for coumadin "to cover me until I can get a new doctor".

## 2016-10-29 NOTE — ED Notes (Signed)
[facility] Patient cleaned and new brief placed. Patient upset regarding care. This RN explained and educated patient on protocols and plan of care. RN spoke with sister on phone and caregiver contacted to be at home upon patient's arrival. Sister understood plan of care. [facility] and [facility]

## 2016-10-29 NOTE — ED Notes (Signed)
[facility] contacted for transport. Requested that [facility] arrive around 0730 to transport patient. Caregivers to arrive at patient's home around 0800.

## 2016-10-29 NOTE — ED Notes (Signed)
MD informed of patient's request for coumadin rx. MD wrote rx and added to discharge instructions.

## 2016-10-29 NOTE — ED Notes (Signed)
Waiting on [facility]... 

## 2016-10-29 NOTE — ED Notes (Signed)
Dressing removed from right ankle, per request. Dressing applied to left heel, per request. Legs repositioned. Head repositioned.

## 2016-10-29 NOTE — ED Notes (Signed)
Patient once again screaming loudly. Refused care of all caregivers who entered room. Requested my presence. Entered room and inquired as what was causing her distress. Patient stated "I need to wipe my nose". In response I questioned why she elected to yell so loudly and refuse to accept help from other caregivers when she needed only facial tissues. Patient stated "my call bell isn't working! So I decided to start yelling. I think they turned it off". Inspected call light wire and found it to dislodged from wall; wire reconnected. Again readjusted patient's head. Performed passive ROM exercises on both of patient's lower extremities. Confirmed address with patient for acquisition of transportation home.

## 2016-11-06 ENCOUNTER — Ambulatory Visit: Payer: Medicare Other | Admitting: Family Medicine

## 2016-12-17 ENCOUNTER — Ambulatory Visit: Payer: Medicare Other | Admitting: Family Medicine

## 2017-02-02 ENCOUNTER — Ambulatory Visit: Payer: Medicare Other | Admitting: Family Medicine

## 2017-04-06 ENCOUNTER — Emergency Department (HOSPITAL_COMMUNITY)
Admission: EM | Admit: 2017-04-06 | Discharge: 2017-04-06 | Disposition: A | Discharge status: Home / Self Care | Payer: Medicare Other | Attending: Emergency Medicine | Admitting: Emergency Medicine

## 2017-04-06 ENCOUNTER — Encounter (HOSPITAL_COMMUNITY): Payer: Self-pay | Admitting: *Deleted

## 2017-04-06 DIAGNOSIS — Z760 Encounter for issue of repeat prescription: Secondary | ICD-10-CM | POA: Insufficient documentation

## 2017-04-06 DIAGNOSIS — Z5321 Procedure and treatment not carried out due to patient leaving prior to being seen by health care provider: Secondary | ICD-10-CM | POA: Diagnosis not present

## 2017-04-06 NOTE — ED Triage Notes (Signed)
Pt requesting refill on her Coumadin 2.5mg  tablets. Pt reports she has been taking Coumadin for over 15 years due to a PE. Pt reports she hasn't seen a PCP or had her Coumadin levels checked in over a year. Pt reports she had a "large supply of Coumadin at home" and has been taking 2.5mg  daily and "has been doing well on that". Pt refuses to give name of PCP she was seeing that prescribed her Coumadin previously. Pt reports she is hoping that once the weather gets better she can search for a new PCP.

## 2017-04-06 NOTE — ED Triage Notes (Signed)
Pt states, "I cannot wait and I will have to leave". Pt encouraged to stay and be seen, but pt refuses. Had Sophronia Simas, RN speak with pt also about staying to be seen, but pt continued to refuse because she said she couldn't wait and needed to be seen now. Discussed situation with Ivery Quale, PA and he said that pt needs to be seen by MD for evaluation.

## 2017-07-05 ENCOUNTER — Other Ambulatory Visit: Payer: Self-pay

## 2017-07-05 ENCOUNTER — Inpatient Hospital Stay (HOSPITAL_COMMUNITY)
Admission: EM | Admit: 2017-07-05 | Discharge: 2017-07-09 | DRG: 603 | Disposition: A | Discharge status: Home Health | Payer: Medicare Other | Attending: Internal Medicine | Admitting: Internal Medicine

## 2017-07-05 ENCOUNTER — Inpatient Hospital Stay (HOSPITAL_COMMUNITY): Payer: Medicare Other

## 2017-07-05 ENCOUNTER — Emergency Department (HOSPITAL_COMMUNITY): Payer: Medicare Other

## 2017-07-05 ENCOUNTER — Encounter (HOSPITAL_COMMUNITY): Payer: Self-pay | Admitting: Emergency Medicine

## 2017-07-05 DIAGNOSIS — N764 Abscess of vulva: Secondary | ICD-10-CM | POA: Diagnosis present

## 2017-07-05 DIAGNOSIS — I890 Lymphedema, not elsewhere classified: Secondary | ICD-10-CM | POA: Diagnosis present

## 2017-07-05 DIAGNOSIS — Z91040 Latex allergy status: Secondary | ICD-10-CM | POA: Diagnosis not present

## 2017-07-05 DIAGNOSIS — Z86711 Personal history of pulmonary embolism: Secondary | ICD-10-CM

## 2017-07-05 DIAGNOSIS — I509 Heart failure, unspecified: Secondary | ICD-10-CM | POA: Diagnosis present

## 2017-07-05 DIAGNOSIS — G20 Parkinson's disease: Secondary | ICD-10-CM | POA: Diagnosis present

## 2017-07-05 DIAGNOSIS — I872 Venous insufficiency (chronic) (peripheral): Secondary | ICD-10-CM | POA: Diagnosis present

## 2017-07-05 DIAGNOSIS — I878 Other specified disorders of veins: Secondary | ICD-10-CM | POA: Diagnosis present

## 2017-07-05 DIAGNOSIS — Z5329 Procedure and treatment not carried out because of patient's decision for other reasons: Secondary | ICD-10-CM | POA: Diagnosis not present

## 2017-07-05 DIAGNOSIS — B9689 Other specified bacterial agents as the cause of diseases classified elsewhere: Secondary | ICD-10-CM | POA: Diagnosis present

## 2017-07-05 DIAGNOSIS — R200 Anesthesia of skin: Secondary | ICD-10-CM | POA: Diagnosis not present

## 2017-07-05 DIAGNOSIS — R0902 Hypoxemia: Secondary | ICD-10-CM | POA: Diagnosis present

## 2017-07-05 DIAGNOSIS — L03119 Cellulitis of unspecified part of limb: Secondary | ICD-10-CM | POA: Diagnosis present

## 2017-07-05 DIAGNOSIS — M40209 Unspecified kyphosis, site unspecified: Secondary | ICD-10-CM | POA: Diagnosis present

## 2017-07-05 DIAGNOSIS — R262 Difficulty in walking, not elsewhere classified: Secondary | ICD-10-CM | POA: Diagnosis present

## 2017-07-05 DIAGNOSIS — L89609 Pressure ulcer of unspecified heel, unspecified stage: Secondary | ICD-10-CM | POA: Diagnosis present

## 2017-07-05 DIAGNOSIS — R1310 Dysphagia, unspecified: Secondary | ICD-10-CM | POA: Diagnosis present

## 2017-07-05 DIAGNOSIS — L03116 Cellulitis of left lower limb: Principal | ICD-10-CM | POA: Diagnosis present

## 2017-07-05 DIAGNOSIS — R0600 Dyspnea, unspecified: Secondary | ICD-10-CM | POA: Diagnosis present

## 2017-07-05 DIAGNOSIS — Z91041 Radiographic dye allergy status: Secondary | ICD-10-CM

## 2017-07-05 DIAGNOSIS — L739 Follicular disorder, unspecified: Secondary | ICD-10-CM

## 2017-07-05 DIAGNOSIS — Z881 Allergy status to other antibiotic agents status: Secondary | ICD-10-CM

## 2017-07-05 DIAGNOSIS — B955 Unspecified streptococcus as the cause of diseases classified elsewhere: Secondary | ICD-10-CM | POA: Diagnosis not present

## 2017-07-05 DIAGNOSIS — L02419 Cutaneous abscess of limb, unspecified: Secondary | ICD-10-CM | POA: Diagnosis not present

## 2017-07-05 DIAGNOSIS — N750 Cyst of Bartholin's gland: Secondary | ICD-10-CM | POA: Diagnosis present

## 2017-07-05 DIAGNOSIS — R110 Nausea: Secondary | ICD-10-CM

## 2017-07-05 DIAGNOSIS — L02416 Cutaneous abscess of left lower limb: Secondary | ICD-10-CM | POA: Diagnosis present

## 2017-07-05 DIAGNOSIS — Z7901 Long term (current) use of anticoagulants: Secondary | ICD-10-CM

## 2017-07-05 DIAGNOSIS — R7881 Bacteremia: Secondary | ICD-10-CM

## 2017-07-05 DIAGNOSIS — L03115 Cellulitis of right lower limb: Secondary | ICD-10-CM | POA: Diagnosis not present

## 2017-07-05 LAB — PROTIME-INR
INR: 2.23
PROTHROMBIN TIME: 24.5 s — AB (ref 11.4–15.2)

## 2017-07-05 LAB — COMPREHENSIVE METABOLIC PANEL
ALT: 14 U/L (ref 14–54)
AST: 25 U/L (ref 15–41)
Albumin: 3.5 g/dL (ref 3.5–5.0)
Alkaline Phosphatase: 63 U/L (ref 38–126)
Anion gap: 11 (ref 5–15)
BILIRUBIN TOTAL: 1.4 mg/dL — AB (ref 0.3–1.2)
BUN: 17 mg/dL (ref 6–20)
CO2: 22 mmol/L (ref 22–32)
CREATININE: 0.68 mg/dL (ref 0.44–1.00)
Calcium: 8.7 mg/dL — ABNORMAL LOW (ref 8.9–10.3)
Chloride: 105 mmol/L (ref 101–111)
GFR calc Af Amer: >60 mL/min (ref >60)
GFR calc non Af Amer: >60 mL/min (ref >60)
Glucose, Bld: 108 mg/dL — ABNORMAL HIGH (ref 65–99)
POTASSIUM: 3.7 mmol/L (ref 3.5–5.1)
Sodium: 138 mmol/L (ref 135–145)
TOTAL PROTEIN: 6.7 g/dL (ref 6.5–8.1)

## 2017-07-05 LAB — CBC WITH DIFFERENTIAL/PLATELET
BASOS ABS: 0 10*3/uL (ref 0.0–0.1)
Basophils Relative: 0 %
Eosinophils Absolute: 0 10*3/uL (ref 0.0–0.7)
Eosinophils Relative: 0 %
HCT: 39.1 % (ref 36.0–46.0)
Hemoglobin: 12.4 g/dL (ref 12.0–15.0)
LYMPHS ABS: 0.3 10*3/uL — AB (ref 0.7–4.0)
LYMPHS PCT: 1 %
MCH: 28 pg (ref 26.0–34.0)
MCHC: 31.7 g/dL (ref 30.0–36.0)
MCV: 88.3 fL (ref 78.0–100.0)
MONO ABS: 1.1 10*3/uL — AB (ref 0.1–1.0)
Monocytes Relative: 5 %
Neutro Abs: 20.2 10*3/uL — ABNORMAL HIGH (ref 1.7–7.7)
Neutrophils Relative %: 94 %
Platelets: 197 10*3/uL (ref 150–400)
RBC: 4.43 MIL/uL (ref 3.87–5.11)
RDW: 13.7 % (ref 11.5–15.5)
WBC: 21.7 10*3/uL — ABNORMAL HIGH (ref 4.0–10.5)

## 2017-07-05 MED ORDER — DEXTROSE 5 % IV SOLN
INTRAVENOUS | Status: AC
  Filled 2017-07-05: qty 2

## 2017-07-05 MED ORDER — WARFARIN - PHARMACIST DOSING INPATIENT
Status: DC
  Administered 2017-07-06 – 2017-07-08 (×2)

## 2017-07-05 MED ORDER — POTASSIUM CHLORIDE IN NACL 20-0.9 MEQ/L-% IV SOLN
INTRAVENOUS | Status: AC
  Administered 2017-07-05 – 2017-07-06 (×2): via INTRAVENOUS

## 2017-07-05 MED ORDER — DEXTROSE 5 % IV SOLN
2.0000 g | INTRAVENOUS | Status: DC
  Administered 2017-07-05: 2 g via INTRAVENOUS
  Filled 2017-07-05: qty 2

## 2017-07-05 MED ORDER — MORPHINE SULFATE (PF) 4 MG/ML IV SOLN: 4.0000 mg | Freq: Once | INTRAVENOUS | Status: DC

## 2017-07-05 MED ORDER — AMOXICILLIN-POT CLAVULANATE 875-125 MG PO TABS
1.0000 | ORAL_TABLET | Freq: Two times a day (BID) | ORAL | Status: DC
  Administered 2017-07-05: 1 via ORAL
  Filled 2017-07-05 (×2): qty 1

## 2017-07-05 MED ORDER — PIPERACILLIN-TAZOBACTAM 3.375 G IVPB 30 MIN: 3.3750 g | Freq: Once | INTRAVENOUS | Status: DC

## 2017-07-05 MED ORDER — ONDANSETRON HCL 4 MG PO TABS: 4.0000 mg | ORAL_TABLET | Freq: Four times a day (QID) | ORAL | Status: DC | PRN

## 2017-07-05 MED ORDER — WARFARIN SODIUM 2.5 MG PO TABS
2.5000 mg | ORAL_TABLET | Freq: Once | ORAL | Status: AC
  Administered 2017-07-05: 2.5 mg via ORAL
  Filled 2017-07-05: qty 1

## 2017-07-05 MED ORDER — ONDANSETRON HCL 4 MG/2ML IJ SOLN: 4.0000 mg | Freq: Four times a day (QID) | INTRAMUSCULAR | Status: DC | PRN

## 2017-07-05 MED ORDER — ACETAMINOPHEN 325 MG PO TABS
650.0000 mg | ORAL_TABLET | Freq: Once | ORAL | Status: AC
  Administered 2017-07-05: 650 mg via ORAL
  Filled 2017-07-05: qty 2

## 2017-07-05 MED ORDER — PIPERACILLIN-TAZOBACTAM 3.375 G IVPB 30 MIN
3.3750 g | Freq: Once | INTRAVENOUS | Status: AC
  Administered 2017-07-05: 3.375 g via INTRAVENOUS
  Filled 2017-07-05: qty 50

## 2017-07-05 MED ORDER — ACETAMINOPHEN 650 MG RE SUPP: 650.0000 mg | Freq: Four times a day (QID) | RECTAL | Status: DC | PRN

## 2017-07-05 MED ORDER — ACETAMINOPHEN 325 MG PO TABS
650.0000 mg | ORAL_TABLET | Freq: Four times a day (QID) | ORAL | Status: DC | PRN
  Administered 2017-07-05 – 2017-07-08 (×7): 650 mg via ORAL
  Filled 2017-07-05 (×8): qty 2

## 2017-07-05 MED ORDER — CLINDAMYCIN PHOSPHATE 600 MG/50ML IV SOLN
600.0000 mg | Freq: Three times a day (TID) | INTRAVENOUS | Status: DC
  Administered 2017-07-05 – 2017-07-06 (×2): 600 mg via INTRAVENOUS
  Filled 2017-07-05 (×2): qty 50

## 2017-07-05 MED ORDER — WARFARIN - PHARMACIST DOSING INPATIENT: Freq: Every day | Status: DC

## 2017-07-05 NOTE — ED Provider Notes (Signed)
[facility] EMERGENCY DEPARTMENT Provider Note   CSN: [id number] Arrival date & time: 07/05/17  1130     History   Chief Complaint Chief Complaint  Patient presents with  . Leg Pain    HPI [name] is a 83 y.o. female.  HPI Pt started having pain in her legs last night.  Both legs are hurting from her hips down to her feet.  It really hurt and she felt like she couldn't move them because of the pain.  Pt thinks she might have a fever but she didn't measure one at home.  No coughing.  No vomiting.  She has had some loose stools but does not think that is related.    Pt lives at home with a caregiver.  History of parkinson that limits her mobility.   Past Medical History:  Diagnosis Date  . CHF (congestive heart failure) (HCC)   . History of pulmonary embolism   . Parkinson's disease (HCC)   . Venous stasis     Patient Active Problem List   Diagnosis Date Noted  . Weakness 12/21/2014  . Leg weakness 12/20/2014  . Lower extremity weakness 12/20/2014  . Precordial pain 12/20/2014  . Diarrhea   . SOB (shortness of breath) 11/29/2014  . Chest pain 11/28/2014  . Pressure ulcer, heel 11/28/2014  . AKI (acute kidney injury) (HCC) 11/28/2014  . Chronic diastolic congestive heart failure (HCC) 11/28/2014  . Parkinson's disease (HCC) 11/28/2014  . Pain in the chest   . Encounter for therapeutic drug monitoring 09/15/2013  . Long term (current) use of anticoagulants 09/22/2010  . Pulmonary embolism (HCC) 08/25/2010  . Generalized weakness 08/16/2009  . LEG EDEMA 08/16/2009  . CONGESTIVE HEART FAILURE, HX OF 08/16/2009  . Venous (peripheral) insufficiency 11/15/2008  . PULMONARY EMBOLISM, HX OF 11/15/2008    History reviewed. No pertinent surgical history.  OB History    No data available       Home Medications    Prior to Admission medications   Medication Sig Start Date End Date Taking? Authorizing Provider  acetaminophen (TYLENOL) 500 MG tablet Take  1,200 mg by mouth every 6 (six) hours as needed for mild pain or moderate pain.    Yes [provider]  warfarin (COUMADIN) 2.5 MG tablet Take 1 tablet (2.5 mg total) by mouth daily at 6 PM. 2.5mg  on [facility] and 4mg  on FRI or Take as directed by anticoagulation clinic. 10/29/16  Yes Lavera Guise, MD    Family History Family History  Problem Relation Age of Onset  . Heart disease Mother        46s    Social History Social History   Tobacco Use  . Smoking status: Never Smoker  . Smokeless tobacco: Never Used  Substance Use Topics  . Alcohol use: No  . Drug use: No     Allergies   Contrast media [iodinated diagnostic agents]; Vancomycin; Lasix [furosemide]; and Vioxx [rofecoxib]   Review of Systems Review of Systems  All other systems reviewed and are negative.    Physical Exam Updated Vital Signs BP 124/64   Pulse (!) 109   Temp 98.6 F (37 C) (Oral)   Resp 17   Ht 1.626 m (5\' 4" )   Wt 68 kg (150 lb)   SpO2 92%   BMI 25.75 kg/m   Physical Exam  Constitutional: No distress.  Elderly, frail   HENT:  Head: Normocephalic and atraumatic.  Right Ear:  External ear normal.  Left Ear: External ear normal.  Eyes: Conjunctivae are normal. Right eye exhibits no discharge. Left eye exhibits no discharge. No scleral icterus.  Neck: Neck supple. Decreased range of motion present. No tracheal deviation present.  kyphotic  Cardiovascular: Normal rate, regular rhythm and intact distal pulses.  Pulmonary/Chest: Effort normal and breath sounds normal. No stridor. No respiratory distress. She has no wheezes. She has no rales.  Abdominal: Soft. Bowel sounds are normal. She exhibits no distension. There is no tenderness. There is no rebound and no guarding.  Musculoskeletal: She exhibits no edema or tenderness.  Edema both legs, tense, thickened skin, erythema of bilateral lower legs below knee, chronic venous stasis changes  Neurological: She is alert. No cranial  nerve deficit (no facial droop, extraocular movements intact, no slurred speech) or sensory deficit. She exhibits abnormal muscle tone. She displays no seizure activity. Coordination abnormal.  Generalized weakness, increased muscle tone and rigidity, head is flexed primarily towards her chest, her caregive has to assist her pushing her head back  Skin: Skin is warm and dry. No rash noted.  Psychiatric: She has a normal mood and affect.  Nursing note and vitals reviewed.    ED Treatments / Results  Labs (all labs ordered are listed, but only abnormal results are displayed) Labs Reviewed  CBC WITH DIFFERENTIAL/PLATELET - Abnormal; Notable for the following components:      Result Value   WBC 21.7 (*)    Neutro Abs 20.2 (*)    Lymphs Abs 0.3 (*)    Monocytes Absolute 1.1 (*)    All other components within normal limits  COMPREHENSIVE METABOLIC PANEL - Abnormal; Notable for the following components:   Glucose, Bld 108 (*)    Calcium 8.7 (*)    Total Bilirubin 1.4 (*)    All other components within normal limits  PROTIME-INR - Abnormal; Notable for the following components:   Prothrombin Time 24.5 (*)    All other components within normal limits  CULTURE, BLOOD (ROUTINE X 2)  CULTURE, BLOOD (ROUTINE X 2)    EKG  EKG Interpretation  Date/Time:  Monday July 05 2017 11:39:00 EST Ventricular Rate:  120 PR Interval:    QRS Duration: 129 QT Interval:  372 QTC Calculation: 524 R Axis:   -53 Text Interpretation:  Poor data quality Undetermined rhythm tachycardic Confirmed by Linwood DibblesKnapp, Yosselin Zoeller [phone]) on 07/05/2017 1:00:29 PM       Radiology Dg Lumbar Spine 2-3 Views  Result Date: 07/05/2017 CLINICAL DATA:  Bilateral leg pain for the past several days with no known injury. History of Parkinson's disease. EXAM: LUMBAR SPINE - 2-3 VIEW COMPARISON:  Lumbar spine series of January 20, 2009 FINDINGS: The lumbar spine exhibits scoliosis convex toward the left centered at T12 with a rotatory  component. There is mild loss of height of the body of L3 which is stable. There is mild disc space narrowing at L2-3. The other disc space heights are reasonably well-maintained. There is no spondylolisthesis. There is facet joint hypertrophy at L5-S1. The observed portions of the sacrum are normal. IMPRESSION: Chronic levocurvature of the thoracolumbar spine with a rotatory component. Partial compression of the body of L3 which is stable. There is narrowing of the L2-3 disc space which is more conspicuous today. Electronically Signed   By: David  [place] M.D.   On: 07/05/2017 12:44    Procedures Procedures (including critical care time)  Medications Ordered in ED Medications  piperacillin-tazobactam (ZOSYN) IVPB 3.375 g (not  administered)  acetaminophen (TYLENOL) tablet 650 mg (not administered)     Initial Impression / Assessment and Plan / ED Course  I have reviewed the triage vital signs and the nursing notes.  Pertinent labs & imaging results that were available during my care of the patient were reviewed by me and considered in my medical decision making (see chart for details).  Clinical Course as of Jul 06 1519  Mon Jul 05, 2017  1216 Pt does not want anything for pain right now.  [JK]  1254 Previous records reviewed.  Pt has history of progressive parkinson disease.  Limits her mobility.  [JK]  1521 Requests tylenol for pain.  Abx ordered  [JK]    Clinical Course User Index [JK] Linwood Dibbles, MD   Patient does have erythema of her lower extremities.  This appears mostly chronic but there may be an acute component of erythema.  Laboratory tests do show an elevated white blood cell count.  I am concerned that she may have cellulitis.  Considering her age, comorbidities and leukocytosis, consult the medical service for admission.  Final Clinical Impressions(s) / ED Diagnoses   Final diagnoses:  Cellulitis of lower extremity, unspecified laterality      Linwood Dibbles, MD 07/05/17  1521

## 2017-07-05 NOTE — Consult Note (Signed)
Reason for Consult:vulvar lesion, r/o bartholins cyst,  Referring Physician: Orson Eva MD  [name] is an 83 y.o. female.  She was admitted earlier today for lymphedema and cellulitis of the lower extremities with chronic venous stasis changes bilaterally in both legs.  Additional examination revealed a draining site on the right labia majora.  Consult was obtained to rule out Bartholin's abscess.  The patient reports that the spot has been tender for approximately 2 weeks.  Her caregiver states that she is noticed it and has been "watching it" and that family was aware of the problem.  Pertinent Gynecological History: Menses: post-menopausal Bleeding: She has had some bleeding from the sore but no postmenopausal bleeding from the vagina Contraception: post menopausal status DES exposure: unknown Blood transfusions: none Sexually transmitted diseases: no past history Previous GYN Procedures:   Last mammogram: normal Date: 2012 Last pap: None found in the records Date:  OB History: G, P   Menstrual History: Menarche age:  No LMP recorded. Patient is postmenopausal.    Past Medical History:  Diagnosis Date  . CHF (congestive heart failure) ([place])   . History of pulmonary embolism   . Parkinson's disease ([place])   . Venous stasis     History reviewed. No pertinent surgical history.  Family History  Problem Relation Age of Onset  . Heart disease Mother        28s    Social History:  reports that  has never smoked. she has never used smokeless tobacco. She reports that she does not drink alcohol or use drugs.  Allergies:  Allergies  Allergen Reactions  . Contrast Media [Iodinated Diagnostic Agents] Anaphylaxis and Shortness Of Breath  . Vancomycin Shortness Of Breath    REACTION: SOB  . Lasix [Furosemide]   . Vioxx [Rofecoxib]     REACTION: irregular heartbeat    Medications: I have reviewed the patient's current medications.  Zosyn will cover for the folliculitis.   She could be converted to an oral agent such as Keflex once her other problems are considered under control  Review of Systems  Gastrointestinal: Positive for nausea.    Blood pressure 133/85, pulse (!) 111, temperature 97.8 F (36.6 C), temperature source Oral, resp. rate 18, height 5' 4"  (1.626 m), weight 150 lb (68 kg), SpO2 98 %. Physical Exam  GI: Soft.  Genitourinary: Vagina normal.  Genitourinary Comments: External genitalia notable for generally normal appearance except for a small ulcerative lesion showing a follicular abscess that is already spontaneously drained.  Massage of the area shows it to be too superficial to be an Bartholin's abscess.  It does not reach to the hymen remnant  And massage does not result any significant purulence.  It is well matured and draining spontaneously, would benefit from antibiotics.  The likelihood of this being a malignancy is relatively low  Results for orders placed or performed during the hospital encounter of 07/05/17 (from the past 48 hour(s))  CBC with Differential     Status: Abnormal   Collection Time: 07/05/17  1:45 PM  Result Value Ref Range   WBC 21.7 (H) 4.0 - 10.5 K/uL   RBC 4.43 3.87 - 5.11 MIL/uL   Hemoglobin 12.4 12.0 - 15.0 g/dL   HCT 39.1 36.0 - 46.0 %   MCV 88.3 78.0 - 100.0 fL   MCH 28.0 26.0 - 34.0 pg   MCHC 31.7 30.0 - 36.0 g/dL   RDW 13.7 11.5 - 15.5 %   Platelets 197 150 -  400 K/uL   Neutrophils Relative % 94 %   Neutro Abs 20.2 (H) 1.7 - 7.7 K/uL   Lymphocytes Relative 1 %   Lymphs Abs 0.3 (L) 0.7 - 4.0 K/uL   Monocytes Relative 5 %   Monocytes Absolute 1.1 (H) 0.1 - 1.0 K/uL   Eosinophils Relative 0 %   Eosinophils Absolute 0.0 0.0 - 0.7 K/uL   Basophils Relative 0 %   Basophils Absolute 0.0 0.0 - 0.1 K/uL  Comprehensive metabolic panel     Status: Abnormal   Collection Time: 07/05/17  1:45 PM  Result Value Ref Range   Sodium 138 135 - 145 mmol/L   Potassium 3.7 3.5 - 5.1 mmol/L   Chloride 105 101 - 111  mmol/L   CO2 22 22 - 32 mmol/L   Glucose, Bld 108 (H) 65 - 99 mg/dL   BUN 17 6 - 20 mg/dL   Creatinine, Ser 0.68 0.44 - 1.00 mg/dL   Calcium 8.7 (L) 8.9 - 10.3 mg/dL   Total Protein 6.7 6.5 - 8.1 g/dL   Albumin 3.5 3.5 - 5.0 g/dL   AST 25 15 - 41 U/L   ALT 14 14 - 54 U/L   Alkaline Phosphatase 63 38 - 126 U/L   Total Bilirubin 1.4 (H) 0.3 - 1.2 mg/dL   GFR calc non Af Amer >60 >60 mL/min   GFR calc Af Amer >60 >60 mL/min    Comment: (NOTE) The eGFR has been calculated using the CKD EPI equation. This calculation has not been validated in all clinical situations. eGFR's persistently <60 mL/min signify possible Chronic Kidney Disease.    Anion gap 11 5 - 15  Protime-INR     Status: Abnormal   Collection Time: 07/05/17  1:45 PM  Result Value Ref Range   Prothrombin Time 24.5 (H) 11.4 - 15.2 seconds   INR 2.23   Blood culture (routine x 2)     Status: None (Preliminary result)   Collection Time: 07/05/17  3:42 PM  Result Value Ref Range   Specimen Description BLOOD RIGHT ANTECUBITAL    Special Requests      BOTTLES DRAWN AEROBIC AND ANAEROBIC Blood Culture adequate volume   Culture PENDING    Report Status PENDING   Blood culture (routine x 2)     Status: None (Preliminary result)   Collection Time: 07/05/17  3:42 PM  Result Value Ref Range   Specimen Description BLOOD BLOOD RIGHT WRIST    Special Requests      BOTTLES DRAWN AEROBIC AND ANAEROBIC Blood Culture adequate volume   Culture PENDING    Report Status PENDING     Dg Lumbar Spine 2-3 Views  Result Date: 07/05/2017 CLINICAL DATA:  Bilateral leg pain for the past several days with no known injury. History of Parkinson's disease. EXAM: LUMBAR SPINE - 2-3 VIEW COMPARISON:  Lumbar spine series of January 20, 2009 FINDINGS: The lumbar spine exhibits scoliosis convex toward the left centered at T12 with a rotatory component. There is mild loss of height of the body of L3 which is stable. There is mild disc space narrowing at  L2-3. The other disc space heights are reasonably well-maintained. There is no spondylolisthesis. There is facet joint hypertrophy at L5-S1. The observed portions of the sacrum are normal. IMPRESSION: Chronic levocurvature of the thoracolumbar spine with a rotatory component. Partial compression of the body of L3 which is stable. There is narrowing of the L2-3 disc space which is more conspicuous today. Electronically  Signed   By: David  Martinique M.D.   On: 07/05/2017 12:44    Assessment/Plan: Vulvar folliculitis, spontaneously draining Patient is Artie been begun on Zosyn for suspected cellulitis elsewhere and this will more than cover the folliculitis.  I do not believe this right labial lesion has anything to do with the lymphedema and chronic venous changes in the left leg.  She could be converted to Keflex orally once her other issues are addressed We will follow with you  Jonnie Kind 07/05/2017

## 2017-07-05 NOTE — ED Notes (Signed)
Pt given water per request

## 2017-07-05 NOTE — ED Triage Notes (Signed)
Pt from home with caregiver. C/o of bilateral leg pain. No new injury. Pedal pulses noted. Both edematous, red and warm

## 2017-07-05 NOTE — Progress Notes (Signed)
ANTICOAGULATION CONSULT NOTE - Initial Consult  Pharmacy Consult for Coumadin Indication: pulmonary embolus  Allergies  Allergen Reactions  . Contrast Media [Iodinated Diagnostic Agents] Anaphylaxis and Shortness Of Breath  . Vancomycin Shortness Of Breath    REACTION: SOB  . Lasix [Furosemide]   . Vioxx [Rofecoxib]     REACTION: irregular heartbeat    Patient Measurements: Height: 5\' 4"  (162.6 cm) Weight: 150 lb (68 kg) IBW/kg (Calculated) : 54.7  Vital Signs: Temp: 98.6 F (37 C) (01/14 1706) Temp Source: Oral (01/14 1706) BP: 117/60 (01/14 1706) Pulse Rate: 112 (01/14 1530)  Labs: Recent Labs    07/05/17 1345  HGB 12.4  HCT 39.1  PLT 197  LABPROT 24.5*  INR 2.23  CREATININE 0.68    Estimated Creatinine Clearance: 53.1 mL/min (by C-G formula based on SCr of 0.68 mg/dL).   Medical History: Past Medical History:  Diagnosis Date  . CHF (congestive heart failure) (HCC)   . History of pulmonary embolism   . Parkinson's disease (HCC)   . Venous stasis     Medications:  See med rec  Assessment: 83 yo female presented to ED with leg pain. Patient is on chronic coumadin for history of PE. INR is therapeutic today.  Goal of Therapy:  INR 2-3 Monitor platelets by anticoagulation protocol: Yes   Plan:  Coumadin 2.5mg  x 1 today Daily PT-INR Monitor for S/S of bleeding  Elder CyphersLorie Rob Mciver, BS Loura BackPharm D, BCPS Clinical Pharmacist Pager [phone] 07/05/2017,5:09 PM

## 2017-07-05 NOTE — ED Notes (Signed)
EKG attempted. Pt with Parkinson's and is unable to hold still for reliable EKG. EDP aware.

## 2017-07-05 NOTE — H&P (Addendum)
History and Physical  [name] BJY:[id number] DOB: 27-Feb-1935 DOA: 07/05/2017   PCP: [name], MD   Patient coming from: Home  Chief Complaint: leg pain and heaviness  HPI:  [name] is a 83 y.o. female with medical history of CHF, PE, Parkinson's disease, and venous stasis presenting with 1-day history of bilateral lower extremity pain.  Because of the pain, the patient felt like she could not move her lower extremities.  She has some subjective fevers and chills, but denied any headache, neck pain, chest pain, nausea, vomiting, diarrhea, abdominal pain, coughing, hemoptysis.  The patient is a poor historian.  She states that she has not really looked at her legs; therefore, she is not able to tell me if she has noted any worsening erythema.  She complains of some shortness of breath, but states that this is at baseline for her.  She also notes that there is some pain in her right labial area.  She initially felt that this was her buttocks, but further examination with the help of her caregiver revealed that the pain was more in her labial area.  She states that there has been a "bump" there for the past 2-3 weeks that has been intermittently draining and painful.  She has some dysuria but denies any hematuria.  In the emergency department, the patient was afebrile hemodynamically stable saturating 92-93% on room air.  BMP and LFTs were unremarkable.  WBC was 21.7.  EKG was difficult to interpret secondary to artifact.  Assessment/Plan: Cellulitis left lower extremity -Start clindamycin (also for coverage of Bartholin cyst infx) -Patient has a vancomycin allergy  Infected Bartholin cyst/vulvar folliculitis -In order to cover organisms for this infection and cellulitis, add Augmentin (to cover E. coli and Streptococcus sp) to clindamycin for staphylococcus and anaerobes -consulted GYN--appreciated  Dyspnea/hypoxia -Likely due to restrictive physiology secondary to the  patient's body habitus -Personally reviewed chest x-ray--no consolidations  History of pulmonary embolus -This occurred nearly 10 years ago -Per patient history--patient has never had any previous PE nor any subsequent PE -Question whether she needs to remain on warfarin indefintely  Dysphagia -likely due to patient's body habitus -speech therapy for swallow eval      Past Medical History:  Diagnosis Date  . CHF (congestive heart failure) (HCC)   . History of pulmonary embolism   . Parkinson's disease (HCC)   . Venous stasis    History reviewed. No pertinent surgical history. Social History:  reports that  has never smoked. she has never used smokeless tobacco. She reports that she does not drink alcohol or use drugs.   Family History  Problem Relation Age of Onset  . Heart disease Mother        90s     Allergies  Allergen Reactions  . Contrast Media [Iodinated Diagnostic Agents] Anaphylaxis and Shortness Of Breath  . Vancomycin Shortness Of Breath    REACTION: SOB  . Lasix [Furosemide]   . Vioxx [Rofecoxib]     REACTION: irregular heartbeat     Prior to Admission medications   Medication Sig Start Date End Date Taking? Authorizing Provider  acetaminophen (TYLENOL) 500 MG tablet Take 1,200 mg by mouth every 6 (six) hours as needed for mild pain or moderate pain.    Yes [provider]  warfarin (COUMADIN) 2.5 MG tablet Take 1 tablet (2.5 mg total) by mouth daily at 6 PM. 2.5mg  on M [facility] and 4mg   on FRI or Take as directed by anticoagulation clinic. 10/29/16  Yes Lavera GuiseLiu, Dana Duo, MD    Review of Systems:  Constitutional:  No weight loss, night sweats, Fevers, chills, fatigue.  Head&Eyes: No headache.  No vision loss.  No eye pain or scotoma ENT:  No Difficulty swallowing,Tooth/dental problems,Sore throat,  No ear ache, post nasal drip,  Cardio-vascular:  No chest pain, Orthopnea, PND, swelling in lower extremities,  dizziness, palpitations  GI:   No  abdominal pain, nausea, vomiting, diarrhea, loss of appetite, hematochezia, melena, heartburn, indigestion, Resp:  No cough. No coughing up of blood .No wheezing.No chest wall deformity  Skin:  no rash or lesions.  GU:  no dysuria, change in color of urine, no urgency or frequency. No flank pain.  Musculoskeletal:   No decreased range of motion. No back pain.  Psych:  No change in mood or affect. No depression or anxiety. Neurologic: No headache, no dysesthesia, no focal weakness, no vision loss. No syncope  Physical Exam: Vitals:   07/05/17 1700 07/05/17 1706 07/05/17 1730 07/05/17 1836  BP: (!) 123/57 117/60 (!) 118/58 133/85  Pulse: (!) 102  97 (!) 111  Resp: 14 18  18   Temp:  98.6 F (37 C)  97.8 F (36.6 C)  TempSrc:  Oral  Oral  SpO2: 92% 93% 92% 98%  Weight:      Height:       General:  A&O x 3, NAD, nontoxic, pleasant/cooperative Head/Eye: No conjunctival hemorrhage, no icterus, [place]/AT, No nystagmus ENT:  No icterus,  No thrush, good dentition, no pharyngeal exudate Neck:  No masses, no lymphadenpathy, no bruits CV:  RRR, no rub, no gallop, no S3 Lung: Bibasilar crackles.  No wheezing.  Good air movement. Abdomen: soft/NT, +BS, nondistended, no peritoneal signs Ext: No cyanosis, No rashes, No petechiae, No lymphangitis, LLE erythema and edema   Labs on Admission:  Basic Metabolic Panel: Recent Labs  Lab 07/05/17 1345  NA 138  K 3.7  CL 105  CO2 22  GLUCOSE 108*  BUN 17  CREATININE 0.68  CALCIUM 8.7*   Liver Function Tests: Recent Labs  Lab 07/05/17 1345  AST 25  ALT 14  ALKPHOS 63  BILITOT 1.4*  PROT 6.7  ALBUMIN 3.5   No results for input(s): LIPASE, AMYLASE in the last 168 hours. No results for input(s): AMMONIA in the last 168 hours. CBC: Recent Labs  Lab 07/05/17 1345  WBC 21.7*  NEUTROABS 20.2*  HGB 12.4  HCT 39.1  MCV 88.3  PLT 197   Coagulation Profile: Recent Labs  Lab 07/05/17 1345  INR 2.23   Cardiac Enzymes: No  results for input(s): CKTOTAL, CKMB, CKMBINDEX, TROPONINI in the last 168 hours. BNP: Invalid input(s): POCBNP CBG: No results for input(s): GLUCAP in the last 168 hours. Urine analysis:    Component Value Date/Time   COLORURINE YELLOW 12/20/2014 2054   APPEARANCEUR CLEAR 12/20/2014 2054   LABSPEC 1.010 12/20/2014 2054   PHURINE 5.0 12/20/2014 2054   GLUCOSEU NEGATIVE 12/20/2014 2054   HGBUR TRACE (A) 12/20/2014 2054   BILIRUBINUR NEGATIVE 12/20/2014 2054   KETONESUR NEGATIVE 12/20/2014 2054   PROTEINUR NEGATIVE 12/20/2014 2054   UROBILINOGEN 0.2 12/20/2014 2054   NITRITE NEGATIVE 12/20/2014 2054   LEUKOCYTESUR TRACE (A) 12/20/2014 2054   Sepsis Labs: @LABRCNTIP (procalcitonin:4,lacticidven:4) ) Recent Results (from the past 240 hour(s))  Blood culture (routine x 2)     Status: None (Preliminary result)   Collection Time: 07/05/17  3:42 PM  Result Value Ref Range Status   Specimen Description BLOOD RIGHT ANTECUBITAL  Final   Special Requests   Final    BOTTLES DRAWN AEROBIC AND ANAEROBIC Blood Culture adequate volume   Culture PENDING  Incomplete   Report Status PENDING  Incomplete  Blood culture (routine x 2)     Status: None (Preliminary result)   Collection Time: 07/05/17  3:42 PM  Result Value Ref Range Status   Specimen Description BLOOD BLOOD RIGHT WRIST  Final   Special Requests   Final    BOTTLES DRAWN AEROBIC AND ANAEROBIC Blood Culture adequate volume   Culture PENDING  Incomplete   Report Status PENDING  Incomplete     Radiological Exams on Admission: Dg Lumbar Spine 2-3 Views  Result Date: 07/05/2017 CLINICAL DATA:  Bilateral leg pain for the past several days with no known injury. History of Parkinson's disease. EXAM: LUMBAR SPINE - 2-3 VIEW COMPARISON:  Lumbar spine series of January 20, 2009 FINDINGS: The lumbar spine exhibits scoliosis convex toward the left centered at T12 with a rotatory component. There is mild loss of height of the body of L3 which is  stable. There is mild disc space narrowing at L2-3. The other disc space heights are reasonably well-maintained. There is no spondylolisthesis. There is facet joint hypertrophy at L5-S1. The observed portions of the sacrum are normal. IMPRESSION: Chronic levocurvature of the thoracolumbar spine with a rotatory component. Partial compression of the body of L3 which is stable. There is narrowing of the L2-3 disc space which is more conspicuous today. Electronically Signed   By: Giannie Soliday  [place] M.D.   On: 07/05/2017 12:44    EKG: Independently reviewed. Too much artifact to interpret    Time spent:60 minutes Code Status:   FULL Family Communication:  No Family at bedside Disposition Plan: expect 2-3 day hospitalization Consults called: OBGYN  DVT Prophylaxis: Coumadin  Catarina Hartshorn, DO  [facility] Pager [phone]  If 7PM-7AM, please contact night-coverage [email or web address] Password [facility] 07/05/2017, 9:04 PM

## 2017-07-06 DIAGNOSIS — R7881 Bacteremia: Secondary | ICD-10-CM

## 2017-07-06 LAB — BLOOD CULTURE ID PANEL (REFLEXED)
ACINETOBACTER BAUMANNII: NOT DETECTED
CANDIDA ALBICANS: NOT DETECTED
CANDIDA KRUSEI: NOT DETECTED
CANDIDA PARAPSILOSIS: NOT DETECTED
CANDIDA TROPICALIS: NOT DETECTED
Candida glabrata: NOT DETECTED
ESCHERICHIA COLI: NOT DETECTED
Enterobacter cloacae complex: NOT DETECTED
Enterobacteriaceae species: NOT DETECTED
Enterococcus species: NOT DETECTED
HAEMOPHILUS INFLUENZAE: NOT DETECTED
KLEBSIELLA OXYTOCA: NOT DETECTED
KLEBSIELLA PNEUMONIAE: NOT DETECTED
Listeria monocytogenes: NOT DETECTED
Neisseria meningitidis: NOT DETECTED
Proteus species: NOT DETECTED
Pseudomonas aeruginosa: NOT DETECTED
SERRATIA MARCESCENS: NOT DETECTED
STAPHYLOCOCCUS AUREUS BCID: NOT DETECTED
STREPTOCOCCUS PYOGENES: NOT DETECTED
Staphylococcus species: NOT DETECTED
Streptococcus agalactiae: NOT DETECTED
Streptococcus pneumoniae: NOT DETECTED
Streptococcus species: DETECTED — AB

## 2017-07-06 LAB — BASIC METABOLIC PANEL
Anion gap: 10 (ref 5–15)
BUN: 21 mg/dL — AB (ref 6–20)
CO2: 21 mmol/L — AB (ref 22–32)
CREATININE: 0.76 mg/dL (ref 0.44–1.00)
Calcium: 8.4 mg/dL — ABNORMAL LOW (ref 8.9–10.3)
Chloride: 105 mmol/L (ref 101–111)
GFR calc Af Amer: >60 mL/min (ref >60)
GFR calc non Af Amer: >60 mL/min (ref >60)
GLUCOSE: 121 mg/dL — AB (ref 65–99)
POTASSIUM: 3.9 mmol/L (ref 3.5–5.1)
Sodium: 136 mmol/L (ref 135–145)

## 2017-07-06 LAB — CBC
HEMATOCRIT: 36.7 % (ref 36.0–46.0)
Hemoglobin: 11.7 g/dL — ABNORMAL LOW (ref 12.0–15.0)
MCH: 27.7 pg (ref 26.0–34.0)
MCHC: 31.9 g/dL (ref 30.0–36.0)
MCV: 87 fL (ref 78.0–100.0)
PLATELETS: 163 10*3/uL (ref 150–400)
RBC: 4.22 MIL/uL (ref 3.87–5.11)
RDW: 14 % (ref 11.5–15.5)
WBC: 19.3 10*3/uL — ABNORMAL HIGH (ref 4.0–10.5)

## 2017-07-06 LAB — PROTIME-INR
INR: 2.8
Prothrombin Time: 29.3 seconds — ABNORMAL HIGH (ref 11.4–15.2)

## 2017-07-06 MED ORDER — CEFAZOLIN SODIUM-DEXTROSE 1-4 GM/50ML-% IV SOLN
1.0000 g | Freq: Three times a day (TID) | INTRAVENOUS | Status: DC
  Filled 2017-07-06: qty 50

## 2017-07-06 MED ORDER — CEFAZOLIN SODIUM-DEXTROSE 2-4 GM/100ML-% IV SOLN
2.0000 g | Freq: Three times a day (TID) | INTRAVENOUS | Status: AC
Start: 2017-07-06 — End: 2017-07-09
  Administered 2017-07-06 – 2017-07-09 (×9): 2 g via INTRAVENOUS
  Filled 2017-07-06 (×10): qty 100

## 2017-07-06 MED ORDER — WARFARIN SODIUM 2.5 MG PO TABS
2.5000 mg | ORAL_TABLET | Freq: Once | ORAL | Status: AC
  Administered 2017-07-06: 2.5 mg via ORAL
  Filled 2017-07-06: qty 1

## 2017-07-06 NOTE — Progress Notes (Signed)
PHARMACY - PHYSICIAN COMMUNICATION CRITICAL VALUE ALERT - BLOOD CULTURE IDENTIFICATION (BCID)  [name] is an 83 y.o. female who presented to [facility] on 07/05/2017 with a chief complaint of fever and chills, LE pain, coughing, hemoptysis.   Assessment:  Lab called with (+) BCx, GPC which is strep.  (include suspected source if known)  Name of physician (or Provider) Contacted: Dr Ardyth HarpsHernandez  Current antibiotics: Augmentin PO and Clindamycin IV  Changes to prescribed antibiotics recommended:  Ancef 2gm IV q8h.  D/C Clindamycin and Augmentin.    Results for orders placed or performed during the hospital encounter of 07/05/17  Blood Culture ID Panel (Reflexed) (Collected: 07/05/2017  3:42 PM)  Result Value Ref Range   Enterococcus species NOT DETECTED NOT DETECTED   Listeria monocytogenes NOT DETECTED NOT DETECTED   Staphylococcus species NOT DETECTED NOT DETECTED   Staphylococcus aureus NOT DETECTED NOT DETECTED   Streptococcus species DETECTED (A) NOT DETECTED   Streptococcus agalactiae NOT DETECTED NOT DETECTED   Streptococcus pneumoniae NOT DETECTED NOT DETECTED   Streptococcus pyogenes NOT DETECTED NOT DETECTED   Acinetobacter baumannii NOT DETECTED NOT DETECTED   Enterobacteriaceae species NOT DETECTED NOT DETECTED   Enterobacter cloacae complex NOT DETECTED NOT DETECTED   Escherichia coli NOT DETECTED NOT DETECTED   Klebsiella oxytoca NOT DETECTED NOT DETECTED   Klebsiella pneumoniae NOT DETECTED NOT DETECTED   Proteus species NOT DETECTED NOT DETECTED   Serratia marcescens NOT DETECTED NOT DETECTED   Haemophilus influenzae NOT DETECTED NOT DETECTED   Neisseria meningitidis NOT DETECTED NOT DETECTED   Pseudomonas aeruginosa NOT DETECTED NOT DETECTED   Candida albicans NOT DETECTED NOT DETECTED   Candida glabrata NOT DETECTED NOT DETECTED   Candida krusei NOT DETECTED NOT DETECTED   Candida parapsilosis NOT DETECTED NOT DETECTED   Candida tropicalis NOT DETECTED NOT  DETECTED   Valrie HartHall, Armelia Penton A 07/06/2017  8:00 AM

## 2017-07-06 NOTE — Progress Notes (Signed)
Pt offered bedpan.  Pt refused bedpan. Nurse educated pt on the safety risk associated with getting OOB.  Pt became belligerent to nurse and nurse tech. Physical therapy consult ordered for evaluation.  Pt was verbally disrespectful to nurse, nurse tech and paid caregiver.  Nurse and nurse tech changed bed linens, provided hygenic care to include changing gown and cleaning genital regions.  Pt stated "it wasn't done to her satisfaction" and demanded a rewash. A rewash was provided by the nurse.

## 2017-07-06 NOTE — Progress Notes (Signed)
PROGRESS NOTE    SIRA ADSIT  UJW:[id number] DOB: 19-Apr-1935 DOA: 07/05/2017 PCP: [name], MD     Brief Narrative:  83 year old woman admitted from home on 1/14 due to left leg pain with swelling.  She also had some subjective fevers and chills.  She had been having difficulty ambulating because of her left leg pain.  She also has what appears to be vulvar folliculitis which is spontaneously draining and has been assessed by GYN, Dr. [name].  On 1/15 blood cultures were positive for gram-positive cocci.   Assessment & Plan:   Principal Problem:   Cellulitis and abscess of left leg Active Problems:   Bacteremia due to Gram-positive bacteria   Venous (peripheral) insufficiency   Pressure ulcer, heel   Parkinson's disease (HCC)   Vulvar abscess   Infected cyst of Bartholin's gland duct   Gram-positive bacteremia -Does not meet sepsis criteria. -BC ID with Streptococcus species. -Discontinue oral antibiotics and place on Ancef pending finalization of cultures. -Source is left lower extremity cellulitis.  Left lower extremity cellulitis -As above continue Ancef. -Leukocytosis improving on antibiotics.  Vulvar folliculitis -Evaluated by Dr. [name], is draining spontaneously, does not appear to be the source of bacteremia.  History of PE -This occurred 10 years ago, per patient she has not had recurrent VT E events however remains anticoagulated on Coumadin. -This needs to be further addressed in outpatient follow-up.  With her age, severe kyphosis and high fall risk I do not believe patient should remain on Coumadin at this time.   DVT prophylaxis: Coumadin Code Status: Full code Family Communication: Sister at bedside updated on plan of care and questions answered Disposition Plan: Pending improvement of cellulitis and final blood culture data  Consultants:   GYN, Dr. [name]  Procedures:   None  Antimicrobials:  Anti-infectives (From admission, onward)    Start     Dose/Rate Route Frequency Ordered Stop   07/06/17 1000  ceFAZolin (ANCEF) IVPB 1 g/50 mL premix  Status:  Discontinued     1 g 100 mL/hr over 30 Minutes Intravenous Every 8 hours 07/06/17 0758 07/06/17 0759   07/06/17 1000  ceFAZolin (ANCEF) IVPB 2g/100 mL premix     2 g 200 mL/hr over 30 Minutes Intravenous Every 8 hours 07/06/17 0759     07/05/17 2200  amoxicillin-clavulanate (AUGMENTIN) 875-125 MG per tablet 1 tablet  Status:  Discontinued     1 tablet Oral Every 12 hours 07/05/17 2121 07/06/17 0756   07/05/17 2200  clindamycin (CLEOCIN) IVPB 600 mg  Status:  Discontinued     600 mg 100 mL/hr over 30 Minutes Intravenous Every 8 hours 07/05/17 2121 07/06/17 0756   07/05/17 2000  cefTRIAXone (ROCEPHIN) 2 g in dextrose 5 % 50 mL IVPB  Status:  Discontinued     2 g 100 mL/hr over 30 Minutes Intravenous Every 24 hours 07/05/17 1815 07/05/17 2121   07/05/17 1730  piperacillin-tazobactam (ZOSYN) IVPB 3.375 g  Status:  Discontinued     3.375 g 100 mL/hr over 30 Minutes Intravenous  Once 07/05/17 1704 07/05/17 1705   07/05/17 1530  piperacillin-tazobactam (ZOSYN) IVPB 3.375 g     3.375 g 100 mL/hr over 30 Minutes Intravenous  Once 07/05/17 1518 07/05/17 1725       Subjective: Lying in bed, states that both vulvar and left leg pain are improved from yesterday  Objective: Vitals:   07/05/17 1706 07/05/17 1730 07/05/17 1836 07/05/17 2256  BP: 117/60 (!) 118/58 133/85 131/67  Pulse:  97 (!) 111 93  Resp: 18  18 18   Temp: 98.6 F (37 C)  97.8 F (36.6 C) 98.4 F (36.9 C)  TempSrc: Oral  Oral Oral  SpO2: 93% 92% 98% 99%  Weight:      Height:        Intake/Output Summary (Last 24 hours) at 07/06/2017 1830 Last data filed at 07/06/2017 0300 Gross per 24 hour  Intake 847.5 ml  Output -  Net 847.5 ml   Filed Weights   07/05/17 1134  Weight: 68 kg (150 lb)    Examination:  General exam: Alert, awake, oriented x 3, severe kyphosis, chin to chest and unable to push  head backwards Respiratory system: Clear to auscultation. Respiratory effort normal. Cardiovascular system:RRR. No murmurs, rubs, gallops. Gastrointestinal system: Abdomen is nondistended, soft and nontender. No organomegaly or masses felt. Normal bowel sounds heard. Central nervous system: Alert and oriented.  Moves all 4 spontaneously Extremities: Chronic venous stasis changes bilaterally, left leg with significant at least 2+ pitting edema and redness from the knee down, positive pedal pulses bilaterally Psychiatry: Judgement and insight appear normal. Mood & affect appropriate.     Data Reviewed: I have personally reviewed following labs and imaging studies  CBC: Recent Labs  Lab 07/05/17 1345 07/06/17 0509  WBC 21.7* 19.3*  NEUTROABS 20.2*  --   HGB 12.4 11.7*  HCT 39.1 36.7  MCV 88.3 87.0  PLT 197 163   Basic Metabolic Panel: Recent Labs  Lab 07/05/17 1345 07/06/17 0509  NA 138 136  K 3.7 3.9  CL 105 105  CO2 22 21*  GLUCOSE 108* 121*  BUN 17 21*  CREATININE 0.68 0.76  CALCIUM 8.7* 8.4*   GFR: Estimated Creatinine Clearance: 53.1 mL/min (by C-G formula based on SCr of 0.76 mg/dL). Liver Function Tests: Recent Labs  Lab 07/05/17 1345  AST 25  ALT 14  ALKPHOS 63  BILITOT 1.4*  PROT 6.7  ALBUMIN 3.5   No results for input(s): LIPASE, AMYLASE in the last 168 hours. No results for input(s): AMMONIA in the last 168 hours. Coagulation Profile: Recent Labs  Lab 07/05/17 1345 07/06/17 0509  INR 2.23 2.80   Cardiac Enzymes: No results for input(s): CKTOTAL, CKMB, CKMBINDEX, TROPONINI in the last 168 hours. BNP (last 3 results) No results for input(s): PROBNP in the last 8760 hours. HbA1C: No results for input(s): HGBA1C in the last 72 hours. CBG: No results for input(s): GLUCAP in the last 168 hours. Lipid Profile: No results for input(s): CHOL, HDL, LDLCALC, TRIG, CHOLHDL, LDLDIRECT in the last 72 hours. Thyroid Function Tests: No results for  input(s): TSH, T4TOTAL, FREET4, T3FREE, THYROIDAB in the last 72 hours. Anemia Panel: No results for input(s): VITAMINB12, FOLATE, FERRITIN, TIBC, IRON, RETICCTPCT in the last 72 hours. Urine analysis:    Component Value Date/Time   COLORURINE YELLOW 12/20/2014 2054   APPEARANCEUR CLEAR 12/20/2014 2054   LABSPEC 1.010 12/20/2014 2054   PHURINE 5.0 12/20/2014 2054   GLUCOSEU NEGATIVE 12/20/2014 2054   HGBUR TRACE (A) 12/20/2014 2054   BILIRUBINUR NEGATIVE 12/20/2014 2054   KETONESUR NEGATIVE 12/20/2014 2054   PROTEINUR NEGATIVE 12/20/2014 2054   UROBILINOGEN 0.2 12/20/2014 2054   NITRITE NEGATIVE 12/20/2014 2054   LEUKOCYTESUR TRACE (A) 12/20/2014 2054   Sepsis Labs: @LABRCNTIP (procalcitonin:4,lacticidven:4)  ) Recent Results (from the past 240 hour(s))  Blood culture (routine x 2)     Status: None (Preliminary result)   Collection Time: 07/05/17  3:42 PM  Result  Value Ref Range Status   Specimen Description BLOOD RIGHT ANTECUBITAL  Final   Special Requests   Final    BOTTLES DRAWN AEROBIC AND ANAEROBIC Blood Culture adequate volume   Culture  Setup Time   Final    GRAM POSITIVE COCCI BOTH AEB + ANA BOTTLES Gram Stain Report Called to,Read Back By and Verified With: MARTIN,J @ 0410 ON 1.15.19 BY BOWMAN,L CRITICAL RESULT CALLED TO, READ BACK BY AND VERIFIED WITH: Raynaldo OpitzHARMD S HURTH [id number] MLM Performed at [facility], [address] [place], [place] [address]    Culture GRAM POSITIVE COCCI  Final   Report Status PENDING  Incomplete  Blood culture (routine x 2)     Status: None (Preliminary result)   Collection Time: 07/05/17  3:42 PM  Result Value Ref Range Status   Specimen Description BLOOD BLOOD RIGHT WRIST  Final   Special Requests   Final    BOTTLES DRAWN AEROBIC AND ANAEROBIC Blood Culture adequate volume   Culture  Setup Time   Final    GRAM POSITIVE COCCI BOTH AEB + ANA BOTTLE Gram Stain Report Called to,Read Back By and Verified With: MARTIN,J @ 0410 ON  1.15.19 BY BOWMAN,L    Culture GRAM POSITIVE COCCI  Final   Report Status PENDING  Incomplete  Blood Culture ID Panel (Reflexed)     Status: Abnormal   Collection Time: 07/05/17  3:42 PM  Result Value Ref Range Status   Enterococcus species NOT DETECTED NOT DETECTED Final   Listeria monocytogenes NOT DETECTED NOT DETECTED Final   Staphylococcus species NOT DETECTED NOT DETECTED Final   Staphylococcus aureus NOT DETECTED NOT DETECTED Final   Streptococcus species DETECTED (A) NOT DETECTED Final    Comment: Not Enterococcus species, Streptococcus agalactiae, Streptococcus pyogenes, or Streptococcus pneumoniae. CRITICAL RESULT CALLED TO, READ BACK BY AND VERIFIED WITH: PHARMD S HURTH [id number] MLM    Streptococcus agalactiae NOT DETECTED NOT DETECTED Final   Streptococcus pneumoniae NOT DETECTED NOT DETECTED Final   Streptococcus pyogenes NOT DETECTED NOT DETECTED Final   Acinetobacter baumannii NOT DETECTED NOT DETECTED Final   Enterobacteriaceae species NOT DETECTED NOT DETECTED Final   Enterobacter cloacae complex NOT DETECTED NOT DETECTED Final   Escherichia coli NOT DETECTED NOT DETECTED Final   Klebsiella oxytoca NOT DETECTED NOT DETECTED Final   Klebsiella pneumoniae NOT DETECTED NOT DETECTED Final   Proteus species NOT DETECTED NOT DETECTED Final   Serratia marcescens NOT DETECTED NOT DETECTED Final   Haemophilus influenzae NOT DETECTED NOT DETECTED Final   Neisseria meningitidis NOT DETECTED NOT DETECTED Final   Pseudomonas aeruginosa NOT DETECTED NOT DETECTED Final   Candida albicans NOT DETECTED NOT DETECTED Final   Candida glabrata NOT DETECTED NOT DETECTED Final   Candida krusei NOT DETECTED NOT DETECTED Final   Candida parapsilosis NOT DETECTED NOT DETECTED Final   Candida tropicalis NOT DETECTED NOT DETECTED Final    Comment: Performed at [facility], [address] [place], [place] [address]         Radiology Studies: Dg Lumbar Spine 2-3  Views  Result Date: 07/05/2017 CLINICAL DATA:  Bilateral leg pain for the past several days with no known injury. History of Parkinson's disease. EXAM: LUMBAR SPINE - 2-3 VIEW COMPARISON:  Lumbar spine series of January 20, 2009 FINDINGS: The lumbar spine exhibits scoliosis convex toward the left centered at T12 with a rotatory component. There is mild loss of height of the body of L3 which is  stable. There is mild disc space narrowing at L2-3. The other disc space heights are reasonably well-maintained. There is no spondylolisthesis. There is facet joint hypertrophy at L5-S1. The observed portions of the sacrum are normal. IMPRESSION: Chronic levocurvature of the thoracolumbar spine with a rotatory component. Partial compression of the body of L3 which is stable. There is narrowing of the L2-3 disc space which is more conspicuous today. Electronically Signed   By: David  [place] M.D.   On: 07/05/2017 12:44   Dg Chest Port 1 View  Result Date: 07/05/2017 CLINICAL DATA:  83 year old female with hypoxia. EXAM: PORTABLE CHEST 1 VIEW COMPARISON:  Chest radiograph dated 10/28/2016 FINDINGS: Evaluation is limited due to patient's positioning and superimposition of the mandible over the lung apices. Mild diffuse interstitial coarsening may represent atelectatic changes. Mild edema is less likely but not excluded. Clinical correlation is recommended. There is no focal consolidation, pleural effusion, or pneumothorax. Minimal left lung base atelectatic changes/scarring. Stable cardiac silhouette. Osteopenia with degenerative changes of the spine. No acute osseous pathology. IMPRESSION: No focal consolidation. Electronically Signed   By: Elgie Collard M.D.   On: 07/05/2017 21:06        Scheduled Meds: . Warfarin - Pharmacist Dosing Inpatient   Does not apply Q24H   Continuous Infusions: . 0.9 % NaCl with KCl 20 mEq / L 75 mL/hr at 07/06/17 1339  .  ceFAZolin (ANCEF) IV 2 g (07/06/17 1711)     LOS: 1 day     Time spent: 25 minutes. Greater than 50% of this time was spent in direct contact with the patient coordinating care.     Chaya Jan, MD [facility] Pager [phone]  If 7PM-7AM, please contact night-coverage [email or web address] Password [email or web address] 07/06/2017, 6:30 PM

## 2017-07-06 NOTE — Progress Notes (Signed)
ANTICOAGULATION CONSULT NOTE   Pharmacy Consult for Coumadin Indication: pulmonary embolus  Allergies  Allergen Reactions  . Contrast Media [Iodinated Diagnostic Agents] Anaphylaxis and Shortness Of Breath  . Vancomycin Shortness Of Breath    REACTION: SOB  . Lasix [Furosemide]   . Vioxx [Rofecoxib]     REACTION: irregular heartbeat    Patient Measurements: Height: 5\' 4"  (162.6 cm) Weight: 150 lb (68 kg) IBW/kg (Calculated) : 54.7  Vital Signs: Temp: 98.4 F (36.9 C) (01/14 2256) Temp Source: Oral (01/14 2256) BP: 131/67 (01/14 2256) Pulse Rate: 93 (01/14 2256)  Labs: Recent Labs    07/05/17 1345 07/06/17 0509  HGB 12.4 11.7*  HCT 39.1 36.7  PLT 197 163  LABPROT 24.5* 29.3*  INR 2.23 2.80  CREATININE 0.68 0.76    Estimated Creatinine Clearance: 53.1 mL/min (by C-G formula based on SCr of 0.76 mg/dL).   Medical History: Past Medical History:  Diagnosis Date  . CHF (congestive heart failure) (HCC)   . History of pulmonary embolism   . Parkinson's disease (HCC)   . Venous stasis    Medications Prior to Admission  Medication Sig Dispense Refill Last Dose  . acetaminophen (TYLENOL) 500 MG tablet Take 1,200 mg by mouth every 6 (six) hours as needed for mild pain or moderate pain.    10/27/2016 at Unknown time  . warfarin (COUMADIN) 2.5 MG tablet Take 1 tablet (2.5 mg total) by mouth daily at 6 PM. 2.5mg  on M T [facility] S SUN and 4mg  on FRI or Take as directed by anticoagulation clinic. 30 tablet 0 07/04/2017 at 2100   Assessment: 83 yo female presented to ED with leg pain. Patient is on chronic coumadin for history of PE. INR is therapeutic today.  Goal of Therapy:  INR 2-3 Monitor platelets by anticoagulation protocol: Yes   Plan:  Coumadin 2.5mg  x 1 today Daily PT-INR Monitor for S/S of bleeding  [name], PharmD Clinical Pharmacist Pager:  [phone] 07/06/2017   07/06/2017,9:25 AM

## 2017-07-06 NOTE — Progress Notes (Signed)
Subjective: Patient reports less vulvar pain, other problems per dr Philip AspenAcosta-Hernandez.    Objective: I have reviewed patient's vital signs. BP 131/67 (BP Location: Left Arm)   Pulse 93   Temp 98.4 F (36.9 C) (Oral)   Resp 18   Ht 5\' 4"  (1.626 m)   Wt 150 lb (68 kg)   SpO2 99%   BMI 25.75 kg/m  .med culture + strep (blood) Extremities: venous stasis dermatitis noted co exam with Dr Philip AspenAcosta Hernandez. Vulva much improved, small 8 mm site with lessening discharge.  Assessment/Plan: Improving vulvar folliculitis, Strep septicemia. Likely from left leg. Will sign off from GYN standpoint.   LOS: 1 day    Tilda BurrowJohn V Tayden Duran 07/06/2017, 3:11 PM

## 2017-07-06 NOTE — Progress Notes (Signed)
CRITICAL VALUE ALERT  Critical Value: Blood culture positive for Gram positive Cocci   Date & Time Notied:  07/06/2017 0415  Provider Notified: Drusilla KannerG. Lama  Orders Received/Actions taken: No new orders recieved

## 2017-07-07 ENCOUNTER — Other Ambulatory Visit: Payer: Self-pay

## 2017-07-07 LAB — BASIC METABOLIC PANEL
Anion gap: 10 (ref 5–15)
BUN: 19 mg/dL (ref 6–20)
CHLORIDE: 105 mmol/L (ref 101–111)
CO2: 21 mmol/L — AB (ref 22–32)
Calcium: 8.3 mg/dL — ABNORMAL LOW (ref 8.9–10.3)
Creatinine, Ser: 0.81 mg/dL (ref 0.44–1.00)
GFR calc Af Amer: >60 mL/min (ref >60)
GFR calc non Af Amer: >60 mL/min (ref >60)
GLUCOSE: 86 mg/dL (ref 65–99)
Potassium: 3.6 mmol/L (ref 3.5–5.1)
Sodium: 136 mmol/L (ref 135–145)

## 2017-07-07 LAB — PROTIME-INR
INR: 3.58
Prothrombin Time: 35.5 seconds — ABNORMAL HIGH (ref 11.4–15.2)

## 2017-07-07 LAB — CBC
HEMATOCRIT: 36.1 % (ref 36.0–46.0)
Hemoglobin: 11.5 g/dL — ABNORMAL LOW (ref 12.0–15.0)
MCH: 28.1 pg (ref 26.0–34.0)
MCHC: 31.9 g/dL (ref 30.0–36.0)
MCV: 88.3 fL (ref 78.0–100.0)
Platelets: 141 10*3/uL — ABNORMAL LOW (ref 150–400)
RBC: 4.09 MIL/uL (ref 3.87–5.11)
RDW: 14.1 % (ref 11.5–15.5)
WBC: 14.1 10*3/uL — ABNORMAL HIGH (ref 4.0–10.5)

## 2017-07-07 NOTE — Progress Notes (Signed)
ANTICOAGULATION CONSULT NOTE   Pharmacy Consult for Coumadin Indication: pulmonary embolus  Allergies  Allergen Reactions  . Contrast Media [Iodinated Diagnostic Agents] Anaphylaxis and Shortness Of Breath  . Vancomycin Shortness Of Breath    REACTION: SOB  . Lasix [Furosemide]   . Vioxx [Rofecoxib]     REACTION: irregular heartbeat    Patient Measurements: Height: 5\' 4"  (162.6 cm) Weight: 150 lb (68 kg) IBW/kg (Calculated) : 54.7  Vital Signs:    Labs: Recent Labs    07/05/17 1345 07/06/17 0509 07/07/17 0614  HGB 12.4 11.7* 11.5*  HCT 39.1 36.7 36.1  PLT 197 163 141*  LABPROT 24.5* 29.3* 35.5*  INR 2.23 2.80 3.58  CREATININE 0.68 0.76 0.81    Estimated Creatinine Clearance: 52.5 mL/min (by C-G formula based on SCr of 0.81 mg/dL).   Medical History: Past Medical History:  Diagnosis Date  . CHF (congestive heart failure) (HCC)   . History of pulmonary embolism   . Parkinson's disease (HCC)   . Venous stasis    Medications Prior to Admission  Medication Sig Dispense Refill Last Dose  . acetaminophen (TYLENOL) 500 MG tablet Take 1,200 mg by mouth every 6 (six) hours as needed for mild pain or moderate pain.    10/27/2016 at Unknown time  . warfarin (COUMADIN) 2.5 MG tablet Take 1 tablet (2.5 mg total) by mouth daily at 6 PM. 2.5mg  on M T W TH S SUN and 4mg  on FRI or Take as directed by anticoagulation clinic. 30 tablet 0 07/04/2017 at 2100   Assessment: 83 yo female presented to ED with leg pain. Patient is on chronic coumadin for history of PE. INR is supratherapeutic today (3.58).   Goal of Therapy:  INR 2-3 Monitor platelets by anticoagulation protocol: Yes   Plan:  Hold coumadin today (INR supratherapeutic) Daily PT-INR Monitor for S/S of bleeding  Judeth CornfieldSteven Waunita Sandstrom, PharmD Clinical Pharmacist 07/07/2017 10:17 AM

## 2017-07-07 NOTE — Evaluation (Signed)
Clinical/Bedside Swallow Evaluation Patient Details  Name: [name] MRN: [id number] Date of Birth: 1934/10/04  Today's Date: 07/07/2017 Time: SLP Start Time (ACUTE ONLY): 0940 SLP Stop Time (ACUTE ONLY): 1045 SLP Time Calculation (min) (ACUTE ONLY): 65 min  Past Medical History:  Past Medical History:  Diagnosis Date  . CHF (congestive heart failure) (HCC)   . History of pulmonary embolism   . Parkinson's disease (HCC)   . Venous stasis    Past Surgical History: History reviewed. No pertinent surgical history. HPI:  83 year old woman admitted from home on 1/14 due to left leg pain with swelling.  She also had some subjective fevers and chills.  She had been having difficulty ambulating because of her left leg pain.  She also has what appears to be vulvar folliculitis which is spontaneously draining and has been assessed by GYN, Dr. [name].  On 1/15 blood cultures were positive for gram-positive cocci.   Assessment / Plan / Recommendation Clinical Impression  Clinical swallow evaluation completed at bedside. Pt with anatomical changes leading to dysphagia (head, neck, back positioning) and difficulty coordinating respiration and swallow. Note that Pt was initially hesitant to participate in evaluation, however after thorough explanation for rationale of visit, Pt agreeable. Pt typically eats meals sitting in a "favorite chair" at home with the assist of her hired assistant (she does not like to call her a caregiver) who cuts up her foods for her and pushes her head back during po intake. Denise Interior and spatial designer) and Pt endorse some coughing with meals and also indicate that her neck flexion has gotten worse in the past month or two. Pt tells me that she has tried using Velcro to hold her head up at home. Pt consumed thin liquids via straw sips, 4 oz puree, and crackers with assist from SLP. No overt s/sx aspiration with thins and puree, however some coughing noted with dry cracker. SLP indicated  that Pt may do best to avoid dry, crumbly foods (or add moisture to these foods) and Pt appreciated recommendation, but stated that she has specific things at home that she likes and will continue to eat (hard cooked ham biscuit). SLP suggested modifications for safe and efficient intake and discussed with Angelique Blonder and her sister as well.   [name] is at risk for aspiration due to compromised anatomical alignment and changes of her spine. She also has Parkinson's disease, but is not medicated for this (Pt declined Levodopa per Pt). SLP provided explanation and information regarding aspiration risks. SLP also assisted Pt in communicating her want/needs with staff and with her permission allowed SLP to post at the head of her bed (attempt to: keep door closed for privacy, sit near her feet for eye to eye communication, one staff member at a time in room as able, encourage Pt to verbalize her wants/needs). Recommend continuing diet as ordered (regular/thin) and SLP to follow up x1 for diet tolerance, pt/caregiver education, and instruction of compensatory strategies. PO meds whole with water or puree as able.   SLP Visit Diagnosis: Dysphagia, unspecified (R13.10)    Aspirat Mild aspiration risk    Diet Recommendation Regular;Thin liquid   Liquid Administration via: Straw Medication Administration: Whole meds with liquid Supervision: Staff to assist with self feeding;Full supervision/cueing for compensatory strategies Compensations: Slow rate;Small sips/bites Postural Changes: Seated upright at 90 degrees;Remain upright for at least 30 minutes after po intake    Other  Recommendations Oral Care Recommendations: Oral care BID;Staff/trained caregiver to provide oral care Other  Recommendations: Clarify dietary restrictions   Follow up Recommendations None      Frequency and Duration min 2x/week  1 week       Prognosis Prognosis for Safe Diet Advancement: Fair      Swallow Study   General  Date of Onset: 07/05/17 HPI: 83 year old woman admitted from home on 1/14 due to left leg pain with swelling.  She also had some subjective fevers and chills.  She had been having difficulty ambulating because of her left leg pain.  She also has what appears to be vulvar folliculitis which is spontaneously draining and has been assessed by GYN, Dr. Emelda FearFerguson.  On 1/15 blood cultures were positive for gram-positive cocci. Type of Study: Bedside Swallow Evaluation Previous Swallow Assessment: None on record Diet Prior to this Study: Regular;Thin liquids Temperature Spikes Noted: No Respiratory Status: Room air History of Recent Intubation: No Behavior/Cognition: Alert;Cooperative;Pleasant mood Oral Cavity Assessment: Within Functional Limits Oral Care Completed by SLP: Yes Oral Cavity - Dentition: Adequate natural dentition Vision: Functional for self-feeding Self-Feeding Abilities: Needs assist Patient Positioning: Postural control interferes with function Baseline Vocal Quality: Normal Volitional Cough: Strong Volitional Swallow: Able to elicit    Oral/Motor/Sensory Function Overall Oral Motor/Sensory Function: Within functional limits   Ice Chips Ice chips: Not tested   Thin Liquid Thin Liquid: Within functional limits Presentation: Straw;Self Fed    Nectar Thick Nectar Thick Liquid: Not tested   Honey Thick     Puree Puree: Within functional limits Presentation: Spoon   Solid   Thank you,  [name], CCC-SLP [phone]    Solid: Within functional limits Presentation: Self Fed        [name],[name] 07/07/2017,3:12 PM

## 2017-07-07 NOTE — Progress Notes (Signed)
Towels, washcloths and gowns gotten for pt per her request. Adjusted thermostat also

## 2017-07-07 NOTE — Plan of Care (Signed)
  Acute Rehab PT Goals(only PT should resolve) Pt Will Go Supine/Side To Sit 07/07/2017 1532 - Progressing by Ocie BobWatkins, Haile Toppins, PT Flowsheets Taken 07/07/2017 1532  Pt will go Supine/Side to Sit with moderate assist Patient Will Transfer Sit To/From Stand 07/07/2017 1532 - Progressing by Ocie BobWatkins, Nabor Thomann, PT Flowsheets Taken 07/07/2017 1532  Patient will transfer sit to/from stand with moderate assist Pt Will Transfer Bed To Chair/Chair To Bed 07/07/2017 1532 - Progressing by Ocie BobWatkins, Alwilda Gilland, PT Flowsheets Taken 07/07/2017 1532  Pt will Transfer Bed to Chair/Chair to Bed with mod assist Pt Will Ambulate 07/07/2017 1532 - Progressing by Ocie BobWatkins, Jaelani Posa, PT Flowsheets Taken 07/07/2017 1532  Pt will Ambulate with moderate assist;with rolling walker;15 feet   3:33 PM, 07/07/17 Ocie BobJames Lynniah Janoski, MPT Physical Therapist with [facility] [phone] office [phone] mobile phone

## 2017-07-07 NOTE — Progress Notes (Signed)
PROGRESS NOTE    Sherald Bargeeggy J Culmer  MVH:[id number]:[id number] DOB: 07/21/1934 DOA: 07/05/2017 PCP: Benita StabileHall, John Z, MD     Brief Narrative:  83 year old woman admitted from home on 1/14 due to left leg pain with swelling.  She also had some subjective fevers and chills.  She had been having difficulty ambulating because of her left leg pain.  She also has what appears to be vulvar folliculitis which is spontaneously draining and has been assessed by GYN, Dr. Emelda FearFerguson.  On 1/15 blood cultures were positive for gram-positive cocci. On 1/16 speciated to strep G.   Assessment & Plan:   Principal Problem:   Cellulitis and abscess of left leg Active Problems:   Bacteremia due to Gram-positive bacteria   Venous (peripheral) insufficiency   Pressure ulcer, heel   Parkinson's disease (HCC)   Vulvar abscess   Infected cyst of Bartholin's gland duct   Streptococcal bacteremia -Does not meet sepsis criteria. -BC ID with Streptococcus species. -Discontinue oral antibiotics and place on Ancef pending finalization of cultures. -Source is left lower extremity cellulitis.  Left lower extremity cellulitis -As above continue Ancef. -SIGNIFICANT improvement overnight on IV abx. -Has bilateral venous stasis dermatitis changes. -Suspect may be able to transition to PO abx in am for DC home.  Vulvar folliculitis -Evaluated by Dr. Emelda FearFerguson, is draining spontaneously, does not appear to be the source of bacteremia.  History of PE -This occurred 10 years ago, per patient she has not had recurrent VT E events however remains anticoagulated on Coumadin. -This needs to be further addressed in outpatient follow-up.  With her age, severe kyphosis and high fall risk I do not believe patient should remain on Coumadin at this time.   DVT prophylaxis: Coumadin Code Status: Full code Family Communication: Caregiver at bedside updated on plan of care and questions answered Disposition Plan: Pending improvement of  cellulitis and final blood culture data. Anticipate DC home on 1/17.  Consultants:   GYN, Dr. Emelda FearFerguson  Procedures:   None  Antimicrobials:  Anti-infectives (From admission, onward)   Start     Dose/Rate Route Frequency Ordered Stop   07/06/17 1000  ceFAZolin (ANCEF) IVPB 1 g/50 mL premix  Status:  Discontinued     1 g 100 mL/hr over 30 Minutes Intravenous Every 8 hours 07/06/17 0758 07/06/17 0759   07/06/17 1000  ceFAZolin (ANCEF) IVPB 2g/100 mL premix     2 g 200 mL/hr over 30 Minutes Intravenous Every 8 hours 07/06/17 0759     07/05/17 2200  amoxicillin-clavulanate (AUGMENTIN) 875-125 MG per tablet 1 tablet  Status:  Discontinued     1 tablet Oral Every 12 hours 07/05/17 2121 07/06/17 0756   07/05/17 2200  clindamycin (CLEOCIN) IVPB 600 mg  Status:  Discontinued     600 mg 100 mL/hr over 30 Minutes Intravenous Every 8 hours 07/05/17 2121 07/06/17 0756   07/05/17 2000  cefTRIAXone (ROCEPHIN) 2 g in dextrose 5 % 50 mL IVPB  Status:  Discontinued     2 g 100 mL/hr over 30 Minutes Intravenous Every 24 hours 07/05/17 1815 07/05/17 2121   07/05/17 1730  piperacillin-tazobactam (ZOSYN) IVPB 3.375 g  Status:  Discontinued     3.375 g 100 mL/hr over 30 Minutes Intravenous  Once 07/05/17 1704 07/05/17 1705   07/05/17 1530  piperacillin-tazobactam (ZOSYN) IVPB 3.375 g     3.375 g 100 mL/hr over 30 Minutes Intravenous  Once 07/05/17 1518 07/05/17 1725       Subjective: Lying in  bed, states that both vulvar and left leg pain are improved from yesterday. Did have some right leg numbness overnight.  Objective: Vitals:   07/05/17 1836 07/05/17 2256 07/06/17 2215 07/07/17 1553  BP: 133/85 131/67 (!) 148/71 136/72  Pulse: (!) 111 93 95 79  Resp: 18 18 20 18   Temp: 97.8 F (36.6 C) 98.4 F (36.9 C) 98.4 F (36.9 C) 97.7 F (36.5 C)  TempSrc: Oral Oral Oral Oral  SpO2: 98% 99% 94% 99%  Weight:      Height:        Intake/Output Summary (Last 24 hours) at 07/07/2017 1701 Last  data filed at 07/07/2017 1110 Gross per 24 hour  Intake 680 ml  Output -  Net 680 ml   Filed Weights   07/05/17 1134  Weight: 68 kg (150 lb)    Examination:  General exam: Alert, awake, oriented x 3, severe kyphosis, chin to chest and unable to push head backwards Respiratory system: Clear to auscultation. Respiratory effort normal. Cardiovascular system:RRR. No murmurs, rubs, gallops. Gastrointestinal system: Abdomen is nondistended, soft and nontender. No organomegaly or masses felt. Normal bowel sounds heard. Central nervous system: Alert and oriented.  Moves all 4 spontaneously Extremities: Chronic venous stasis changes bilaterally, left leg with decreased edema and erythema. Psychiatry: Judgement and insight appear normal. Mood & affect appropriate.     Data Reviewed: I have personally reviewed following labs and imaging studies  CBC: Recent Labs  Lab 07/05/17 1345 07/06/17 0509 07/07/17 0614  WBC 21.7* 19.3* 14.1*  NEUTROABS 20.2*  --   --   HGB 12.4 11.7* 11.5*  HCT 39.1 36.7 36.1  MCV 88.3 87.0 88.3  PLT 197 163 141*   Basic Metabolic Panel: Recent Labs  Lab 07/05/17 1345 07/06/17 0509 07/07/17 0614  NA 138 136 136  K 3.7 3.9 3.6  CL 105 105 105  CO2 22 21* 21*  GLUCOSE 108* 121* 86  BUN 17 21* 19  CREATININE 0.68 0.76 0.81  CALCIUM 8.7* 8.4* 8.3*   GFR: Estimated Creatinine Clearance: 52.5 mL/min (by C-G formula based on SCr of 0.81 mg/dL). Liver Function Tests: Recent Labs  Lab 07/05/17 1345  AST 25  ALT 14  ALKPHOS 63  BILITOT 1.4*  PROT 6.7  ALBUMIN 3.5   No results for input(s): LIPASE, AMYLASE in the last 168 hours. No results for input(s): AMMONIA in the last 168 hours. Coagulation Profile: Recent Labs  Lab 07/05/17 1345 07/06/17 0509 07/07/17 0614  INR 2.23 2.80 3.58   Cardiac Enzymes: No results for input(s): CKTOTAL, CKMB, CKMBINDEX, TROPONINI in the last 168 hours. BNP (last 3 results) No results for input(s): PROBNP in  the last 8760 hours. HbA1C: No results for input(s): HGBA1C in the last 72 hours. CBG: No results for input(s): GLUCAP in the last 168 hours. Lipid Profile: No results for input(s): CHOL, HDL, LDLCALC, TRIG, CHOLHDL, LDLDIRECT in the last 72 hours. Thyroid Function Tests: No results for input(s): TSH, T4TOTAL, FREET4, T3FREE, THYROIDAB in the last 72 hours. Anemia Panel: No results for input(s): VITAMINB12, FOLATE, FERRITIN, TIBC, IRON, RETICCTPCT in the last 72 hours. Urine analysis:    Component Value Date/Time   COLORURINE YELLOW 12/20/2014 2054   APPEARANCEUR CLEAR 12/20/2014 2054   LABSPEC 1.010 12/20/2014 2054   PHURINE 5.0 12/20/2014 2054   GLUCOSEU NEGATIVE 12/20/2014 2054   HGBUR TRACE (A) 12/20/2014 2054   BILIRUBINUR NEGATIVE 12/20/2014 2054   KETONESUR NEGATIVE 12/20/2014 2054   PROTEINUR NEGATIVE 12/20/2014 2054  UROBILINOGEN 0.2 12/20/2014 2054   NITRITE NEGATIVE 12/20/2014 2054   LEUKOCYTESUR TRACE (A) 12/20/2014 2054   Sepsis Labs: @LABRCNTIP (procalcitonin:4,lacticidven:4)  ) Recent Results (from the past 240 hour(s))  Blood culture (routine x 2)     Status: Abnormal (Preliminary result)   Collection Time: 07/05/17  3:42 PM  Result Value Ref Range Status   Specimen Description BLOOD RIGHT ANTECUBITAL  Final   Special Requests   Final    BOTTLES DRAWN AEROBIC AND ANAEROBIC Blood Culture adequate volume   Culture  Setup Time   Final    GRAM POSITIVE COCCI BOTH AEB + ANA BOTTLES Gram Stain Report Called to,Read Back By and Verified With: MARTIN,J @ 0410 ON 1.15.19 BY BOWMAN,L CRITICAL RESULT CALLED TO, READ BACK BY AND VERIFIED WITH: Raynaldo Opitz [id number] MLM Performed at [facility], [address], [place], [place] [address]    Culture STREPTOCOCCUS GROUP G (A)  Final   Report Status PENDING  Incomplete  Blood culture (routine x 2)     Status: Abnormal (Preliminary result)   Collection Time: 07/05/17  3:42 PM  Result Value Ref Range Status     Specimen Description BLOOD BLOOD RIGHT WRIST  Final   Special Requests   Final    BOTTLES DRAWN AEROBIC AND ANAEROBIC Blood Culture adequate volume   Culture  Setup Time   Final    GRAM POSITIVE COCCI BOTH AEB + ANA BOTTLE Gram Stain Report Called to,Read Back By and Verified With: MARTIN,J @ 0410 ON 1.15.19 BY BOWMAN,L    Culture STREPTOCOCCUS GROUP G (A)  Final   Report Status PENDING  Incomplete  Blood Culture ID Panel (Reflexed)     Status: Abnormal   Collection Time: 07/05/17  3:42 PM  Result Value Ref Range Status   Enterococcus species NOT DETECTED NOT DETECTED Final   Listeria monocytogenes NOT DETECTED NOT DETECTED Final   Staphylococcus species NOT DETECTED NOT DETECTED Final   Staphylococcus aureus NOT DETECTED NOT DETECTED Final   Streptococcus species DETECTED (A) NOT DETECTED Final    Comment: Not Enterococcus species, Streptococcus agalactiae, Streptococcus pyogenes, or Streptococcus pneumoniae. CRITICAL RESULT CALLED TO, READ BACK BY AND VERIFIED WITH: PHARMD S HURTH [id number] MLM    Streptococcus agalactiae NOT DETECTED NOT DETECTED Final   Streptococcus pneumoniae NOT DETECTED NOT DETECTED Final   Streptococcus pyogenes NOT DETECTED NOT DETECTED Final   Acinetobacter baumannii NOT DETECTED NOT DETECTED Final   Enterobacteriaceae species NOT DETECTED NOT DETECTED Final   Enterobacter cloacae complex NOT DETECTED NOT DETECTED Final   Escherichia coli NOT DETECTED NOT DETECTED Final   Klebsiella oxytoca NOT DETECTED NOT DETECTED Final   Klebsiella pneumoniae NOT DETECTED NOT DETECTED Final   Proteus species NOT DETECTED NOT DETECTED Final   Serratia marcescens NOT DETECTED NOT DETECTED Final   Haemophilus influenzae NOT DETECTED NOT DETECTED Final   Neisseria meningitidis NOT DETECTED NOT DETECTED Final   Pseudomonas aeruginosa NOT DETECTED NOT DETECTED Final   Candida albicans NOT DETECTED NOT DETECTED Final   Candida glabrata NOT DETECTED NOT DETECTED Final    Candida krusei NOT DETECTED NOT DETECTED Final   Candida parapsilosis NOT DETECTED NOT DETECTED Final   Candida tropicalis NOT DETECTED NOT DETECTED Final    Comment: Performed at [facility], [address] [place], [place] [address]         Radiology Studies: Dg Chest [place] 1 View  Result Date: 07/05/2017 CLINICAL DATA:  83 year old female with  hypoxia. EXAM: PORTABLE CHEST 1 VIEW COMPARISON:  Chest radiograph dated 10/28/2016 FINDINGS: Evaluation is limited due to patient's positioning and superimposition of the mandible over the lung apices. Mild diffuse interstitial coarsening may represent atelectatic changes. Mild edema is less likely but not excluded. Clinical correlation is recommended. There is no focal consolidation, pleural effusion, or pneumothorax. Minimal left lung base atelectatic changes/scarring. Stable cardiac silhouette. Osteopenia with degenerative changes of the spine. No acute osseous pathology. IMPRESSION: No focal consolidation. Electronically Signed   By: Elgie Collard M.D.   On: 07/05/2017 21:06        Scheduled Meds: . Warfarin - Pharmacist Dosing Inpatient   Does not apply Q24H   Continuous Infusions: .  ceFAZolin (ANCEF) IV 2 g (07/07/17 1650)     LOS: 2 days    Time spent: 25 minutes. Greater than 50% of this time was spent in direct contact with the patient coordinating care.     Chaya Jan, MD [facility] Pager [phone]  If 7PM-7AM, please contact night-coverage [email or web address] Password [email or web address] 07/07/2017, 5:01 PM

## 2017-07-07 NOTE — Evaluation (Signed)
Physical Therapy Evaluation Patient Details Name: [name] MRN: [id number] DOB: 1934-07-25 Today's Date: 07/07/2017   History of Present Illness  [name] is a 83 y.o. female with medical history of CHF, PE, Parkinson's disease, and venous stasis presenting with 1-day history of bilateral lower extremity pain.  Because of the pain, the patient felt like she could not move her lower extremities.  She has some subjective fevers and chills, but denied any headache, neck pain, chest pain, nausea, vomiting, diarrhea, abdominal pain, coughing, hemoptysis.  The patient is a poor historian.  She states that she has not really looked at her legs; therefore, she is not able to tell me if she has noted any worsening erythema.  She complains of some shortness of breath, but states that this is at baseline for her.  She also notes that there is some pain in her right labial area.  She initially felt that this was her buttocks, but further examination with the help of her caregiver revealed that the pain was more in her labial area.  She states that there has been a "bump" there for the past 2-3 weeks that has been intermittently draining and painful.  She has some dysuria but denies any hematuria.    Clinical Impression  Patient presents with extremely kyphotic trunk and forward flexed neck, limited neck extension -45 degrees to neutral position secondary to possible neck flexor contractures.  Patient had difficulty breathing with attempts to extend neck, required Max assist for sitting up and standing, took 1 side step holding onto RW, unable to take steps beyond bedside due to poor balance and generalized weakness.  Patient will benefit from continued physical therapy in hospital and recommended venue below to increase strength, balance, endurance for safe ADLs and gait.    Follow Up Recommendations SNF;Supervision/Assistance - 24 hour    Equipment Recommendations  None recommended by PT     Recommendations for Other Services       Precautions / Restrictions Precautions Precautions: Fall Restrictions Weight Bearing Restrictions: No      Mobility  Bed Mobility Overal bed mobility: Needs Assistance Bed Mobility: Supine to Sit;Sit to Supine     Supine to sit: Max assist        Transfers Overall transfer level: Needs assistance Equipment used: Rolling walker (2 wheeled) Transfers: Sit to/from Stand Sit to Stand: Max assist         General transfer comment: required much stretching of low back and thighs to position patient in upright position before attempting sit to stand  Ambulation/Gait                Stairs            Wheelchair Mobility    Modified Rankin (Stroke Patients Only)       Balance Overall balance assessment: Needs assistance Sitting-balance support: No upper extremity supported;Feet supported Sitting balance-Leahy Scale: Fair     Standing balance support: Bilateral upper extremity supported;During functional activity Standing balance-Leahy Scale: Poor                               Pertinent Vitals/Pain Pain Assessment: No/denies pain    Home Living Family/patient expects to be discharged to:: Private residence Living Arrangements: Non-relatives/Friends Available Help at Discharge: Personal care attendant Type of Home: House Home Access: Level entry;Stairs to enter Entrance Stairs-Rails: None Entrance Stairs-Number of Steps: 1 Home Layout: One level Home  Equipment: Dan HumphreysWalker - 4 wheels;Wheelchair - manual;Grab bars - tub/shower      Prior Function Level of Independence: Needs assistance   Gait / Transfers Assistance Needed: Assisted ambulation with 4 wheeled walker short household distances  ADL's / Homemaking Assistance Needed: home aides from 8am to 4 pm and 4 pm to 8 pm x 7 days/week        Hand Dominance        Extremity/Trunk Assessment   Upper Extremity Assessment Upper Extremity  Assessment: Generalized weakness    Lower Extremity Assessment Lower Extremity Assessment: Generalized weakness    Cervical / Trunk Assessment Cervical / Trunk Assessment: Kyphotic  Communication   Communication: No difficulties  Cognition Arousal/Alertness: Awake/alert Behavior During Therapy: WFL for tasks assessed/performed Overall Cognitive Status: Within Functional Limits for tasks assessed                                        General Comments      Exercises     Assessment/Plan    PT Assessment Patient needs continued PT services  PT Problem List Decreased strength;Decreased range of motion;Decreased activity tolerance;Decreased balance;Decreased mobility       PT Treatment Interventions Gait training;Therapeutic activities;Therapeutic exercise;Functional mobility training;Patient/family education    PT Goals (Current goals can be found in the Care Plan section)  Acute Rehab PT Goals Patient Stated Goal: return home with caregivers to assist PT Goal Formulation: With patient/family Time For Goal Achievement: 07/21/17 Potential to Achieve Goals: Good    Frequency Min 3X/week   Barriers to discharge        Co-evaluation               AM-PAC PT "6 Clicks" Daily Activity  Outcome Measure Difficulty turning over in bed (including adjusting bedclothes, sheets and blankets)?: A Lot Difficulty moving from lying on back to sitting on the side of the bed? : A Lot Difficulty sitting down on and standing up from a chair with arms (e.g., wheelchair, bedside commode, etc,.)?: A Lot Help needed moving to and from a bed to chair (including a wheelchair)?: Total Help needed walking in hospital room?: Total Help needed climbing 3-5 steps with a railing? : Total 6 Click Score: 9    End of Session   Activity Tolerance: Patient limited by fatigue Patient left: in bed;with call bell/phone within reach Nurse Communication: Mobility status PT Visit  Diagnosis: Unsteadiness on feet (R26.81);Other abnormalities of gait and mobility (R26.89);Muscle weakness (generalized) (M62.81)    Time: 2841-32441427-1503 PT Time Calculation (min) (ACUTE ONLY): 36 min   Charges:   PT Evaluation $PT Eval Moderate Complexity: 1 Mod PT Treatments $Therapeutic Activity: 23-37 mins   PT G Codes:        3:30 PM, 07/07/17 Ocie BobJames Ruqaya Strauss, MPT Physical Therapist [facility] [phone] office [phone] mobile phone

## 2017-07-08 DIAGNOSIS — R7881 Bacteremia: Secondary | ICD-10-CM

## 2017-07-08 DIAGNOSIS — L03119 Cellulitis of unspecified part of limb: Secondary | ICD-10-CM

## 2017-07-08 LAB — PROTIME-INR
INR: 2.78
Prothrombin Time: 29.1 seconds — ABNORMAL HIGH (ref 11.4–15.2)

## 2017-07-08 LAB — CULTURE, BLOOD (ROUTINE X 2)
SPECIAL REQUESTS: ADEQUATE
Special Requests: ADEQUATE

## 2017-07-08 MED ORDER — DEXTROSE 5 % IV SOLN
2.0000 g | INTRAVENOUS | Status: DC
  Administered 2017-07-09: 2 g via INTRAVENOUS
  Filled 2017-07-08 (×4): qty 2

## 2017-07-08 MED ORDER — WARFARIN SODIUM 2 MG PO TABS
2.0000 mg | ORAL_TABLET | Freq: Once | ORAL | Status: AC
  Administered 2017-07-08: 2 mg via ORAL
  Filled 2017-07-08: qty 1

## 2017-07-08 NOTE — Progress Notes (Signed)
PROGRESS NOTE  [name] ZOX:[id number] DOB: September 17, 1934 DOA: 07/05/2017 PCP: [name], MD  Brief History:  83 year old woman admitted from home on 1/14 due to left leg pain with swelling.  She also had some subjective fevers and chills.  She had been having difficulty ambulating because of her left leg pain.  She also has what appears to be vulvar folliculitis which is spontaneously draining and has been assessed by GYN, Dr. [name].  On 1/15 blood cultures were positive for gram-positive cocci. On 1/16 speciated to strep G.    Assessment/Plan:  Streptococcal bacteremia -Does not meet sepsis criteria. -07/05/17 blood culture = group G strept -continue cefazolin -Source is left lower extremity cellulitis. -place PICC line -home with ceftriaxone through 07/18/17  Left lower extremity cellulitis -As above continue Ancef. -SIGNIFICANT improvement overnight on IV abx. -Has bilateral venous stasis dermatitis changes.   Vulvar folliculitis -Evaluated by Dr. [name], is draining spontaneously, does not appear to be the source of bacteremia.  History of PE -This occurred 10 years ago, per patient she has not had recurrent VT E events however remains anticoagulated on Coumadin. -This needs to be further addressed in outpatient follow-up.  With her age, severe kyphosis and high fall risk I do not believe patient should remain on Coumadin at this time.    Disposition Plan:   Home 1/18 if stable Family Communication: No  Family at bedside  Consultants:  GYN  Code Status:  FULL  DVT Prophylaxis:  warfarin   Procedures: As Listed in Progress Note Above  Antibiotics: Cefazolin 07/06/17>>> Ceftriaxone 1/14    Subjective: Patient denies fevers, chills, headache, chest pain, dyspnea, nausea, vomiting, diarrhea, abdominal pain, dysuria, hematuria, hematochezia, and melena.   Objective: Vitals:   07/07/17 1553 07/07/17 2043 07/07/17 2300 07/08/17 1441  BP:  136/72  134/70 (!) 142/69  Pulse: 79  80 85  Resp: 18   18  Temp: 97.7 F (36.5 C)  98 F (36.7 C) 97.7 F (36.5 C)  TempSrc: Oral   Oral  SpO2: 99% 96% 97% 100%  Weight:      Height:        Intake/Output Summary (Last 24 hours) at 07/08/2017 1635 Last data filed at 07/08/2017 0959 Gross per 24 hour  Intake 200 ml  Output 1 ml  Net 199 ml   Weight change:  Exam:   General:  Pt is alert, follows commands appropriately, not in acute distress  HEENT: No icterus, No thrush, No neck mass, [place]/AT  Cardiovascular: RRR, S1/S2, no rubs, no gallops  Respiratory: CTA bilaterally, no wheezing, no crackles, no rhonchi  Abdomen: Soft/+BS, non tender, non distended, no guarding  Extremities: No edema, No lymphangitis, No petechiae, No rashes, no synovitis   Data Reviewed: I have personally reviewed following labs and imaging studies Basic Metabolic Panel: Recent Labs  Lab 07/05/17 1345 07/06/17 0509 07/07/17 0614  NA 138 136 136  K 3.7 3.9 3.6  CL 105 105 105  CO2 22 21* 21*  GLUCOSE 108* 121* 86  BUN 17 21* 19  CREATININE 0.68 0.76 0.81  CALCIUM 8.7* 8.4* 8.3*   Liver Function Tests: Recent Labs  Lab 07/05/17 1345  AST 25  ALT 14  ALKPHOS 63  BILITOT 1.4*  PROT 6.7  ALBUMIN 3.5   No results for input(s): LIPASE, AMYLASE in the last 168 hours. No results for input(s): AMMONIA in the last 168 hours. Coagulation Profile: Recent Labs  Lab 07/05/17 1345 07/06/17 0509 07/07/17 0614 07/08/17 0546  INR 2.23 2.80 3.58 2.78   CBC: Recent Labs  Lab 07/05/17 1345 07/06/17 0509 07/07/17 0614  WBC 21.7* 19.3* 14.1*  NEUTROABS 20.2*  --   --   HGB 12.4 11.7* 11.5*  HCT 39.1 36.7 36.1  MCV 88.3 87.0 88.3  PLT 197 163 141*   Cardiac Enzymes: No results for input(s): CKTOTAL, CKMB, CKMBINDEX, TROPONINI in the last 168 hours. BNP: Invalid input(s): POCBNP CBG: No results for input(s): GLUCAP in the last 168 hours. HbA1C: No results for input(s): HGBA1C in  the last 72 hours. Urine analysis:    Component Value Date/Time   COLORURINE YELLOW 12/20/2014 2054   APPEARANCEUR CLEAR 12/20/2014 2054   LABSPEC 1.010 12/20/2014 2054   PHURINE 5.0 12/20/2014 2054   GLUCOSEU NEGATIVE 12/20/2014 2054   HGBUR TRACE (A) 12/20/2014 2054   BILIRUBINUR NEGATIVE 12/20/2014 2054   KETONESUR NEGATIVE 12/20/2014 2054   PROTEINUR NEGATIVE 12/20/2014 2054   UROBILINOGEN 0.2 12/20/2014 2054   NITRITE NEGATIVE 12/20/2014 2054   LEUKOCYTESUR TRACE (A) 12/20/2014 2054   Sepsis Labs: @LABRCNTIP (procalcitonin:4,lacticidven:4) ) Recent Results (from the past 240 hour(s))  Blood culture (routine x 2)     Status: Abnormal   Collection Time: 07/05/17  3:42 PM  Result Value Ref Range Status   Specimen Description BLOOD RIGHT ANTECUBITAL  Final   Special Requests   Final    BOTTLES DRAWN AEROBIC AND ANAEROBIC Blood Culture adequate volume   Culture  Setup Time   Final    GRAM POSITIVE COCCI BOTH AEB + ANA BOTTLES Gram Stain Report Called to,Read Back By and Verified With: MARTIN,J @ 0410 ON 1.15.19 BY BOWMAN,L CRITICAL RESULT CALLED TO, READ BACK BY AND VERIFIED WITH: Raynaldo Opitz [id number] MLM Performed at [facility], [address], [place], [place] [address]    Culture STREPTOCOCCUS GROUP G (A)  Final   Report Status 07/08/2017 FINAL  Final   Organism ID, Bacteria STREPTOCOCCUS GROUP G  Final      Susceptibility   Streptococcus group g - MIC*    CLINDAMYCIN <=0.25 SENSITIVE Sensitive     AMPICILLIN <=0.25 SENSITIVE Sensitive     ERYTHROMYCIN <=0.12 SENSITIVE Sensitive     VANCOMYCIN 0.5 SENSITIVE Sensitive     CEFTRIAXONE <=0.12 SENSITIVE Sensitive     LEVOFLOXACIN 1 SENSITIVE Sensitive     PENICILLIN Value in next row Sensitive      SENSITIVE<=0.06    * STREPTOCOCCUS GROUP G  Blood culture (routine x 2)     Status: Abnormal   Collection Time: 07/05/17  3:42 PM  Result Value Ref Range Status   Specimen Description BLOOD BLOOD RIGHT WRIST   Final   Special Requests   Final    BOTTLES DRAWN AEROBIC AND ANAEROBIC Blood Culture adequate volume   Culture  Setup Time   Final    GRAM POSITIVE COCCI BOTH AEB + ANA BOTTLE Gram Stain Report Called to,Read Back By and Verified With: MARTIN,J @ 0410 ON 1.15.19 BY BOWMAN,L    Culture (A)  Final    STREPTOCOCCUS GROUP G SUSCEPTIBILITIES PERFORMED ON PREVIOUS CULTURE WITHIN THE LAST 5 DAYS. Performed at [facility], [address], [place], [place] [address]    Report Status 07/08/2017 FINAL  Final  Blood Culture ID Panel (Reflexed)     Status: Abnormal   Collection Time: 07/05/17  3:42 PM  Result Value Ref Range Status   Enterococcus species  NOT DETECTED NOT DETECTED Final   Listeria monocytogenes NOT DETECTED NOT DETECTED Final   Staphylococcus species NOT DETECTED NOT DETECTED Final   Staphylococcus aureus NOT DETECTED NOT DETECTED Final   Streptococcus species DETECTED (A) NOT DETECTED Final    Comment: Not Enterococcus species, Streptococcus agalactiae, Streptococcus pyogenes, or Streptococcus pneumoniae. CRITICAL RESULT CALLED TO, READ BACK BY AND VERIFIED WITH: PHARMD S HURTH [id number] MLM    Streptococcus agalactiae NOT DETECTED NOT DETECTED Final   Streptococcus pneumoniae NOT DETECTED NOT DETECTED Final   Streptococcus pyogenes NOT DETECTED NOT DETECTED Final   Acinetobacter baumannii NOT DETECTED NOT DETECTED Final   Enterobacteriaceae species NOT DETECTED NOT DETECTED Final   Enterobacter cloacae complex NOT DETECTED NOT DETECTED Final   Escherichia coli NOT DETECTED NOT DETECTED Final   Klebsiella oxytoca NOT DETECTED NOT DETECTED Final   Klebsiella pneumoniae NOT DETECTED NOT DETECTED Final   Proteus species NOT DETECTED NOT DETECTED Final   Serratia marcescens NOT DETECTED NOT DETECTED Final   Haemophilus influenzae NOT DETECTED NOT DETECTED Final   Neisseria meningitidis NOT DETECTED NOT DETECTED Final   Pseudomonas aeruginosa NOT DETECTED NOT DETECTED  Final   Candida albicans NOT DETECTED NOT DETECTED Final   Candida glabrata NOT DETECTED NOT DETECTED Final   Candida krusei NOT DETECTED NOT DETECTED Final   Candida parapsilosis NOT DETECTED NOT DETECTED Final   Candida tropicalis NOT DETECTED NOT DETECTED Final    Comment: Performed at [facility], [address], [place], [place] [address]     Scheduled Meds: . warfarin  2 mg Oral Once  . Warfarin - Pharmacist Dosing Inpatient   Does not apply Q24H   Continuous Infusions: .  ceFAZolin (ANCEF) IV Stopped (07/08/17 0959)  . [START ON 07/09/2017] cefTRIAXone (ROCEPHIN)  IV      Procedures/Studies: Dg Lumbar Spine 2-3 Views  Result Date: 07/05/2017 CLINICAL DATA:  Bilateral leg pain for the past several days with no known injury. History of Parkinson's disease. EXAM: LUMBAR SPINE - 2-3 VIEW COMPARISON:  Lumbar spine series of January 20, 2009 FINDINGS: The lumbar spine exhibits scoliosis convex toward the left centered at T12 with a rotatory component. There is mild loss of height of the body of L3 which is stable. There is mild disc space narrowing at L2-3. The other disc space heights are reasonably well-maintained. There is no spondylolisthesis. There is facet joint hypertrophy at L5-S1. The observed portions of the sacrum are normal. IMPRESSION: Chronic levocurvature of the thoracolumbar spine with a rotatory component. Partial compression of the body of L3 which is stable. There is narrowing of the L2-3 disc space which is more conspicuous today. Electronically Signed   By: Scotland Korver  [place] M.D.   On: 07/05/2017 12:44   Dg Chest Port 1 View  Result Date: 07/05/2017 CLINICAL DATA:  82 year old female with hypoxia. EXAM: PORTABLE CHEST 1 VIEW COMPARISON:  Chest radiograph dated 10/28/2016 FINDINGS: Evaluation is limited due to patient's positioning and superimposition of the mandible over the lung apices. Mild diffuse interstitial coarsening may represent atelectatic changes. Mild edema  is less likely but not excluded. Clinical correlation is recommended. There is no focal consolidation, pleural effusion, or pneumothorax. Minimal left lung base atelectatic changes/scarring. Stable cardiac silhouette. Osteopenia with degenerative changes of the spine. No acute osseous pathology. IMPRESSION: No focal consolidation. Electronically Signed   By: Elgie Collard M.D.   On: 07/05/2017 21:06    Catarina Hartshorn, DO  [facility] Pager [phone]  If 7PM-7AM,  please contact night-coverage [email or web address] Password [email or web address] 07/08/2017, 4:35 PM   LOS: 3 days

## 2017-07-08 NOTE — Progress Notes (Signed)
  Speech Language Pathology Treatment: Dysphagia  Patient Details Name: [name] MRN: [id number] DOB: 09/25/1934 Today's Date: 07/08/2017 Time: 1346-1410 SLP Time Calculation (min) (ACUTE ONLY): 24 min  Assessment / Plan / Recommendation Clinical Impression  Pt seen at bedside after she finished her lunch meal (sitting up in chair with feeder assist). Pt reports tolerating regular textures when staff cut up her foods and she picks items that she wants to eat. SLP discussed impact of head positioning on her ability to breathe, speak, and swallow. Pt asked SLP to push her head back and SLP complied by gently pushing forehead up and back. SLP again offered to order soft foods for her, however Pt again declined stating that she was doing fine with diet ordered. SLP will sign off. Recommend feeder assist for all meals.    HPI HPI: 83 year old woman admitted from home on 1/14 due to left leg pain with swelling.  She also had some subjective fevers and chills.  She had been having difficulty ambulating because of her left leg pain.  She also has what appears to be vulvar folliculitis which is spontaneously draining and has been assessed by GYN, Dr. Emelda FearFerguson.  On 1/15 blood cultures were positive for gram-positive cocci.      SLP Plan  Discharge SLP treatment due to (comment)       Recommendations  Diet recommendations: Regular;Thin liquid(staff/caregivers to cut food) Liquids provided via: Straw Medication Administration: Whole meds with liquid Supervision: Staff to assist with self feeding;Full supervision/cueing for compensatory strategies Compensations: Slow rate;Small sips/bites Postural Changes and/or Swallow Maneuvers: Seated upright 90 degrees;Upright 30-60 min after meal                Oral Care Recommendations: Oral care BID;Staff/trained caregiver to provide oral care Follow up Recommendations: None Plan: Discharge SLP treatment due to (comment)       Thank  you,  Havery MorosDabney Sanjith Siwek, CCC-SLP [phone]                 [name] 07/08/2017, 3:04 PM

## 2017-07-08 NOTE — Progress Notes (Signed)
ANTICOAGULATION CONSULT NOTE   Pharmacy Consult for Coumadin Indication: pulmonary embolus  Allergies  Allergen Reactions  . Contrast Media [Iodinated Diagnostic Agents] Anaphylaxis and Shortness Of Breath  . Vancomycin Shortness Of Breath    REACTION: SOB  . Lasix [Furosemide]   . Vioxx [Rofecoxib]     REACTION: irregular heartbeat   Patient Measurements: Height: 5\' 4"  (162.6 cm) Weight: 150 lb (68 kg) IBW/kg (Calculated) : 54.7  Vital Signs:   Labs: Recent Labs    07/05/17 1345 07/06/17 0509 07/07/17 0614 07/08/17 0546  HGB 12.4 11.7* 11.5*  --   HCT 39.1 36.7 36.1  --   PLT 197 163 141*  --   LABPROT 24.5* 29.3* 35.5* 29.1*  INR 2.23 2.80 3.58 2.78  CREATININE 0.68 0.76 0.81  --    Estimated Creatinine Clearance: 52.5 mL/min (by C-G formula based on SCr of 0.81 mg/dL).  Assessment: 83 yo female presented to ED with leg pain. Patient is on chronic coumadin for history of PE. INR is now back within therapeutic range.  Goal of Therapy:  INR 2-3 Monitor platelets by anticoagulation protocol: Yes   Plan:  Warfarin 2mg  PO x 1 Daily PT-INR Monitor for S/S of bleeding  Mady GemmaHayes, Chipper Koudelka R, [facility] Hospital[facility]PH 07/08/2017 11:54 AM

## 2017-07-08 NOTE — Progress Notes (Signed)
Physical Therapy Treatment Patient Details Name: [name] MRN: [id number] DOB: 07/21/1934 Today's Date: 07/08/2017    History of Present Illness [name] is a 83 y.o. female with medical history of CHF, PE, Parkinson's disease, and venous stasis presenting with 1-day history of bilateral lower extremity pain.  Because of the pain, the patient felt like she could not move her lower extremities.  She has some subjective fevers and chills, but denied any headache, neck pain, chest pain, nausea, vomiting, diarrhea, abdominal pain, coughing, hemoptysis.  The patient is a poor historian.  She states that she has not really looked at her legs; therefore, she is not able to tell me if she has noted any worsening erythema.  She complains of some shortness of breath, but states that this is at baseline for her.  She also notes that there is some pain in her right labial area.  She initially felt that this was her buttocks, but further examination with the help of her caregiver revealed that the pain was more in her labial area.  She states that there has been a "bump" there for the past 2-3 weeks that has been intermittently draining and painful.  She has some dysuria but denies any hematuria.    PT Comments    Patient required much passive and active assisted stretching bilateral knees and low back into flexion in order to attempt sit to stands and transfer.  Patient demonstrates increased BLE strength for taking steps at bedside with Max assist to pull patient forward to avoid falling backwards.  Patient tolerated sitting up in chair for > 1 hour before requiring Max/total assist to transfer back to bed with stand pivot with knees blocked.  Patient will benefit from continued physical therapy in hospital and recommended venue below to increase strength, balance, endurance for safe ADLs and gait.   Follow Up Recommendations  SNF;Supervision/Assistance - 24 hour     Equipment Recommendations  None  recommended by PT    Recommendations for Other Services       Precautions / Restrictions Precautions Precautions: Fall Restrictions Weight Bearing Restrictions: No    Mobility  Bed Mobility Overal bed mobility: Needs Assistance Bed Mobility: Supine to Sit;Sit to Supine     Supine to sit: Max assist Sit to supine: Max assist      Transfers Overall transfer level: Needs assistance Equipment used: Rolling walker (2 wheeled) Transfers: Sit to/from [employer] CorporationStand;Stand Pivot Transfers Sit to Stand: Max assist Stand pivot transfers: Max assist       General transfer comment: required much stretching of low back and thighs to position patient in upright position before attempting sit to stand  Ambulation/Gait Ambulation/Gait assistance: Max assist Ambulation Distance (Feet): 5 Feet Assistive device: Rolling walker (2 wheeled) Gait Pattern/deviations: Decreased step length - right;Decreased step length - left;Decreased stride length   Gait velocity interpretation: Below normal speed for age/gender General Gait Details: limited to a few steps at bedside with Max assist to pull patient forward to avoid falling backwards, able to take side steps and steps forward/backwards before having to sit due to c/o fatigue/BLE weakness   Stairs            Wheelchair Mobility    Modified Rankin (Stroke Patients Only)       Balance Overall balance assessment: Needs assistance Sitting-balance support: No upper extremity supported;Feet supported Sitting balance-Leahy Scale: Fair     Standing balance support: Bilateral upper extremity supported;During functional activity Standing balance-Leahy Scale: Poor  Cognition Arousal/Alertness: Awake/alert Behavior During Therapy: WFL for tasks assessed/performed Overall Cognitive Status: Within Functional Limits for tasks assessed                                        Exercises       General Comments        Pertinent Vitals/Pain Pain Assessment: Faces Faces Pain Scale: Hurts little more Pain Location: low/upper back Pain Descriptors / Indicators: Sharp;Discomfort Pain Intervention(s): Limited activity within patient's tolerance;Monitored during session    Home Living                      Prior Function            PT Goals (current goals can now be found in the care plan section) Acute Rehab PT Goals Patient Stated Goal: return home with caregivers to assist PT Goal Formulation: With patient Time For Goal Achievement: 07/21/17 Potential to Achieve Goals: Good Progress towards PT goals: Progressing toward goals    Frequency    Min 3X/week      PT Plan Current plan remains appropriate    Co-evaluation              AM-PAC PT "6 Clicks" Daily Activity  Outcome Measure  Difficulty turning over in bed (including adjusting bedclothes, sheets and blankets)?: A Lot Difficulty moving from lying on back to sitting on the side of the bed? : A Lot Difficulty sitting down on and standing up from a chair with arms (e.g., wheelchair, bedside commode, etc,.)?: A Lot Help needed moving to and from a bed to chair (including a wheelchair)?: A Lot Help needed walking in hospital room?: A Lot Help needed climbing 3-5 steps with a railing? : Total 6 Click Score: 11    End of Session   Activity Tolerance: Patient tolerated treatment well;Patient limited by fatigue Patient left: in chair;with call bell/phone within reach;with family/visitor present Nurse Communication: Mobility status PT Visit Diagnosis: Unsteadiness on feet (R26.81);Other abnormalities of gait and mobility (R26.89);Muscle weakness (generalized) (M62.81)     Time: 1126-1208 PT Time Calculation (min) (ACUTE ONLY): 42 min  Charges:  $Therapeutic Activity: 38-52 mins                    G Codes:       3:30 PM, 08/07/17 Ocie Bob, MPT Physical Therapist with  [facility] 336 [phone] office [phone] mobile phone

## 2017-07-09 DIAGNOSIS — L02419 Cutaneous abscess of limb, unspecified: Secondary | ICD-10-CM

## 2017-07-09 DIAGNOSIS — B955 Unspecified streptococcus as the cause of diseases classified elsewhere: Secondary | ICD-10-CM

## 2017-07-09 DIAGNOSIS — R7881 Bacteremia: Secondary | ICD-10-CM

## 2017-07-09 LAB — PROTIME-INR
INR: 2.44
Prothrombin Time: 26.3 seconds — ABNORMAL HIGH (ref 11.4–15.2)

## 2017-07-09 LAB — CBC
HEMATOCRIT: 37.9 % (ref 36.0–46.0)
HEMOGLOBIN: 12 g/dL (ref 12.0–15.0)
MCH: 27.8 pg (ref 26.0–34.0)
MCHC: 31.7 g/dL (ref 30.0–36.0)
MCV: 87.7 fL (ref 78.0–100.0)
PLATELETS: 206 10*3/uL (ref 150–400)
RBC: 4.32 MIL/uL (ref 3.87–5.11)
RDW: 13.8 % (ref 11.5–15.5)
WBC: 8.6 10*3/uL (ref 4.0–10.5)

## 2017-07-09 LAB — BASIC METABOLIC PANEL
ANION GAP: 12 (ref 5–15)
BUN: 12 mg/dL (ref 6–20)
CHLORIDE: 105 mmol/L (ref 101–111)
CO2: 24 mmol/L (ref 22–32)
Calcium: 8.2 mg/dL — ABNORMAL LOW (ref 8.9–10.3)
Creatinine, Ser: 0.6 mg/dL (ref 0.44–1.00)
GFR calc Af Amer: >60 mL/min (ref >60)
GLUCOSE: 98 mg/dL (ref 65–99)
POTASSIUM: 3.3 mmol/L — AB (ref 3.5–5.1)
Sodium: 141 mmol/L (ref 135–145)

## 2017-07-09 MED ORDER — WARFARIN SODIUM 2 MG PO TABS: 2.0000 mg | ORAL_TABLET | Freq: Once | ORAL | Status: DC

## 2017-07-09 MED ORDER — AMOXICILLIN 500 MG PO TABS
500.0000 mg | ORAL_TABLET | Freq: Three times a day (TID) | ORAL | 0 refills | Status: DC
Start: 2017-07-09 — End: 2017-12-14

## 2017-07-09 NOTE — Progress Notes (Signed)
ANTICOAGULATION CONSULT NOTE   Pharmacy Consult for Coumadin Indication: pulmonary embolus  Allergies  Allergen Reactions  . Contrast Media [Iodinated Diagnostic Agents] Anaphylaxis and Shortness Of Breath  . Vancomycin Shortness Of Breath    REACTION: SOB  . Lasix [Furosemide]   . Vioxx [Rofecoxib]     REACTION: irregular heartbeat   Patient Measurements: Height: 5\' 4"  (162.6 cm) Weight: 150 lb (68 kg) IBW/kg (Calculated) : 54.7  Vital Signs: Temp: 97.4 F (36.3 C) (01/18 0658) Temp Source: Oral (01/18 0658) BP: 151/63 (01/18 0658) Pulse Rate: 86 (01/18 0658) Labs: Recent Labs    07/07/17 0614 07/08/17 0546 07/09/17 0905  HGB 11.5*  --  12.0  HCT 36.1  --  37.9  PLT 141*  --  206  LABPROT 35.5* 29.1* 26.3*  INR 3.58 2.78 2.44  CREATININE 0.81  --  0.60   Estimated Creatinine Clearance: 53.1 mL/min (by C-G formula based on SCr of 0.6 mg/dL).  Assessment: 83 yo female presented to ED with leg pain. Patient is on chronic coumadin for history of PE. INR still therapeutic but trending down, most likely due to held dose from high INR  Goal of Therapy:  INR 2-3 Monitor platelets by anticoagulation protocol: Yes   Plan:  Warfarin 2mg  PO x 1 Daily PT-INR Monitor for S/S of bleeding  Tad MooreSteven C Kyros Salzwedel, [facility] CenterRPH 07/09/2017 11:05 AM

## 2017-07-09 NOTE — Progress Notes (Signed)
Patient discharged home with personal belongings

## 2017-07-09 NOTE — Care Management Note (Addendum)
Case Management Note  Patient Details  Name: [name] MRN: [id number] Date of Birth: 02/01/1935  Subjective/Objective:   Admitted with cellulitis. Pt is from home, lives alone, [facility] Per pt, she has all necessary DME. PT has recommended SNF. Pt declines and prefers to go home with resumption of HH services through [facility]. Pt aware HH has 48 hrs to make resumption visit. Pt requests RN be added to order.                   Action/Plan: DC home today. Katrina, HH rep, aware of DC and will pull pt info from chart. Pt requests EMS transport. Bedside RN and Licensed conveyancerunit secretary have made EMS arrangements. [facility]  Pt aware of calls.  Expected Discharge Date:  07/09/17               Expected Discharge Plan:  Home w Home Health Services  In-House Referral:  Clinical Social Work  Discharge planning Services  CM Consult  Post Acute Care Choice:  Home Health, Resumption of Svcs/PTA Provider Choice offered to:  Patient  HH [facility]  [facility]  Status of Service:  Completed, signed off  Malcolm MetroChildress, Kelin Borum Demske, RN 07/09/2017, 10:09 AM

## 2017-07-09 NOTE — Progress Notes (Signed)
Patient refusing to sign consent form for PICC line placement until she talks to her doctor. Informed Dr. Arbutus Leasat.

## 2017-07-09 NOTE — Progress Notes (Signed)
IV removed and site intact. Patient sent home via EMS.

## 2017-07-09 NOTE — Discharge Summary (Signed)
Physician Discharge Summary  [name]:[id number] DOB: 12-Nov-1934 DOA: 07/05/2017  PCP: Tara Stabile, MD  Admit date: 07/05/2017 Discharge date: 07/09/2017  Admitted From: home Disposition:  Home  Recommendations for Outpatient Follow-up:  1. Follow up with PCP in 1-2 weeks 2. Please obtain BMP/CBC in one week    Discharge Condition: Stable CODE STATUS: FULL Diet recommendation:  Regular   Brief/Interim Summary: 83 year old woman admitted from home on 1/14 due to left leg pain with swelling. She also had some subjective fevers and chills. She had been having difficulty ambulating because of her left leg pain. She also has what appears to be vulvar folliculitis which is spontaneously draining and has been assessed by GYN, Dr. Emelda Green. On 1/15 blood cultures were positive for gram-positive cocci. On 1/16 speciated to strep G.  The patient was initially on clindamycin.  This was changed to cefazolin with good clinical improvement.  After a long discussion regarding the risks, benefits, and alternatives, the patient did not want a PICC line.  She will be sent home with amoxicillin for 9 additional days to complete 2 weeks of therapy.    Discharge Diagnoses:  Streptococcalbacteremia -Does not meet sepsis criteria. -07/05/17 blood culture = group G strept -continue cefazolin -Source is left lower extremity cellulitis. -pt refused PICC line for home IV antibiotics--I discussed with the patient the risks, benefits, and alternatives including but not limited to infection, DVT, pain, bleeding.  The patient expressed understanding and did not want PICC line insertion. -home with amoxicillin 500 mg 3 times daily x9 additional days  Left lower extremity cellulitis -As above continue Ancef. -SIGNIFICANT improvement overnight on IV abx. -Has bilateral venous stasis dermatitis changes.  Vulvar folliculitis -Evaluated by Dr. Emelda Green, is draining spontaneously--no need for  surgical intervention  History of PE -This occurred 10 years ago, per patient she has not had recurrent VT E events however remains anticoagulated on Coumadin. -This needs to be further addressed in outpatient follow-up. With her age, severe kyphosis and high fall risk I do not believe patient should remain on Coumadin at this time.      Discharge Instructions  Discharge Instructions    Diet general   Complete by:  As directed    Increase activity slowly   Complete by:  As directed      Allergies as of 07/09/2017      Reactions   Contrast Media [iodinated Diagnostic Agents] Anaphylaxis, Shortness Of Breath   Vancomycin Shortness Of Breath   REACTION: SOB   Lasix [furosemide]    Vioxx [rofecoxib]    REACTION: irregular heartbeat      Medication List    TAKE these medications   acetaminophen 500 MG tablet Commonly known as:  TYLENOL Take 1,200 mg by mouth every 6 (six) hours as needed for mild pain or moderate pain.   amoxicillin 500 MG tablet Commonly known as:  AMOXIL Take 1 tablet (500 mg total) by mouth 3 (three) times daily.   warfarin 2.5 MG tablet Commonly known as:  COUMADIN Take 1 tablet (2.5 mg total) by mouth daily at 6 PM. 2.5mg  on [facility] and 4mg  on FRI or Take as directed by anticoagulation clinic.       Allergies  Allergen Reactions  . Contrast Media [Iodinated Diagnostic Agents] Anaphylaxis and Shortness Of Breath  . Vancomycin Shortness Of Breath    REACTION: SOB  . Lasix [Furosemide]   . Vioxx [Rofecoxib]     REACTION: irregular  heartbeat    Consultations:  GYN   Procedures/Studies: Dg Lumbar Spine 2-3 Views  Result Date: 07/05/2017 CLINICAL DATA:  Bilateral leg pain for the past several days with no known injury. History of Parkinson's disease. EXAM: LUMBAR SPINE - 2-3 VIEW COMPARISON:  Lumbar spine series of January 20, 2009 FINDINGS: The lumbar spine exhibits scoliosis convex toward the left centered at T12 with a rotatory  component. There is mild loss of height of the body of L3 which is stable. There is mild disc space narrowing at L2-3. The other disc space heights are reasonably well-maintained. There is no spondylolisthesis. There is facet joint hypertrophy at L5-S1. The observed portions of the sacrum are normal. IMPRESSION: Chronic levocurvature of the thoracolumbar spine with a rotatory component. Partial compression of the body of L3 which is stable. There is narrowing of the L2-3 disc space which is more conspicuous today. Electronically Signed   By: [name]  [place] M.D.   On: 07/05/2017 12:44   Dg Chest Port 1 View  Result Date: 07/05/2017 CLINICAL DATA:  83 year old female with hypoxia. EXAM: PORTABLE CHEST 1 VIEW COMPARISON:  Chest radiograph dated 10/28/2016 FINDINGS: Evaluation is limited due to patient's positioning and superimposition of the mandible over the lung apices. Mild diffuse interstitial coarsening may represent atelectatic changes. Mild edema is less likely but not excluded. Clinical correlation is recommended. There is no focal consolidation, pleural effusion, or pneumothorax. Minimal left lung base atelectatic changes/scarring. Stable cardiac silhouette. Osteopenia with degenerative changes of the spine. No acute osseous pathology. IMPRESSION: No focal consolidation. Electronically Signed   By: [name] M.D.   On: 07/05/2017 21:06        Discharge Exam: Vitals:   07/08/17 2126 07/09/17 0658  BP: (!) 152/70 (!) 151/63  Pulse: 82 86  Resp: 18 18  Temp: (!) 97.4 F (36.3 C) (!) 97.4 F (36.3 C)  SpO2: 100% 96%   Vitals:   07/08/17 1441 07/08/17 2101 07/08/17 2126 07/09/17 0658  BP: (!) 142/69  (!) 152/70 (!) 151/63  Pulse: 85  82 86  Resp: 18  18 18   Temp: 97.7 F (36.5 C)  (!) 97.4 F (36.3 C) (!) 97.4 F (36.3 C)  TempSrc: Oral  Oral Oral  SpO2: 100% 98% 100% 96%  Weight:      Height:        General: Pt is alert, awake, not in acute distress Cardiovascular: RRR,  S1/S2 +, no rubs, no gallops Respiratory: Diminished breath sounds at the bases but clear to auscultation.  No wheezing. Abdominal: Soft, NT, ND, bowel sounds + Extremities: no edema, no cyanosis; no cellulitis   The results of significant diagnostics from this hospitalization (including imaging, microbiology, ancillary and laboratory) are listed below for reference.    Significant Diagnostic Studies: Dg Lumbar Spine 2-3 Views  Result Date: 07/05/2017 CLINICAL DATA:  Bilateral leg pain for the past several days with no known injury. History of Parkinson's disease. EXAM: LUMBAR SPINE - 2-3 VIEW COMPARISON:  Lumbar spine series of January 20, 2009 FINDINGS: The lumbar spine exhibits scoliosis convex toward the left centered at T12 with a rotatory component. There is mild loss of height of the body of L3 which is stable. There is mild disc space narrowing at L2-3. The other disc space heights are reasonably well-maintained. There is no spondylolisthesis. There is facet joint hypertrophy at L5-S1. The observed portions of the sacrum are normal. IMPRESSION: Chronic levocurvature of the thoracolumbar spine with a rotatory component. Partial compression  of the body of L3 which is stable. There is narrowing of the L2-3 disc space which is more conspicuous today. Electronically Signed   By: Jolyssa Oplinger  [place] M.D.   On: 07/05/2017 12:44   Dg Chest Port 1 View  Result Date: 07/05/2017 CLINICAL DATA:  83 year old female with hypoxia. EXAM: PORTABLE CHEST 1 VIEW COMPARISON:  Chest radiograph dated 10/28/2016 FINDINGS: Evaluation is limited due to patient's positioning and superimposition of the mandible over the lung apices. Mild diffuse interstitial coarsening may represent atelectatic changes. Mild edema is less likely but not excluded. Clinical correlation is recommended. There is no focal consolidation, pleural effusion, or pneumothorax. Minimal left lung base atelectatic changes/scarring. Stable cardiac silhouette.  Osteopenia with degenerative changes of the spine. No acute osseous pathology. IMPRESSION: No focal consolidation. Electronically Signed   By: [name] M.D.   On: 07/05/2017 21:06     Microbiology: Recent Results (from the past 240 hour(s))  Blood culture (routine x 2)     Status: Abnormal   Collection Time: 07/05/17  3:42 PM  Result Value Ref Range Status   Specimen Description BLOOD RIGHT ANTECUBITAL  Final   Special Requests   Final    BOTTLES DRAWN AEROBIC AND ANAEROBIC Blood Culture adequate volume   Culture  Setup Time   Final    GRAM POSITIVE COCCI BOTH AEB + ANA BOTTLES Gram Stain Report Called to,Read Back By and Verified With: MARTIN,J @ 0410 ON 1.15.19 BY BOWMAN,L CRITICAL RESULT CALLED TO, READ BACK BY AND VERIFIED WITH: Raynaldo Opitz [id number] MLM Performed at [facility], [address], [place], [place] [address]    Culture STREPTOCOCCUS GROUP G (A)  Final   Report Status 07/08/2017 FINAL  Final   Organism ID, Bacteria STREPTOCOCCUS GROUP G  Final      Susceptibility   Streptococcus group g - MIC*    CLINDAMYCIN <=0.25 SENSITIVE Sensitive     AMPICILLIN <=0.25 SENSITIVE Sensitive     ERYTHROMYCIN <=0.12 SENSITIVE Sensitive     VANCOMYCIN 0.5 SENSITIVE Sensitive     CEFTRIAXONE <=0.12 SENSITIVE Sensitive     LEVOFLOXACIN 1 SENSITIVE Sensitive     PENICILLIN Value in next row Sensitive      SENSITIVE<=0.06    * STREPTOCOCCUS GROUP G  Blood culture (routine x 2)     Status: Abnormal   Collection Time: 07/05/17  3:42 PM  Result Value Ref Range Status   Specimen Description BLOOD BLOOD RIGHT WRIST  Final   Special Requests   Final    BOTTLES DRAWN AEROBIC AND ANAEROBIC Blood Culture adequate volume   Culture  Setup Time   Final    GRAM POSITIVE COCCI BOTH AEB + ANA BOTTLE Gram Stain Report Called to,Read Back By and Verified With: MARTIN,J @ 0410 ON 1.15.19 BY BOWMAN,L    Culture (A)  Final    STREPTOCOCCUS GROUP G SUSCEPTIBILITIES PERFORMED ON  PREVIOUS CULTURE WITHIN THE LAST 5 DAYS. Performed at [facility], [address] [place], [place] [address]    Report Status 07/08/2017 FINAL  Final  Blood Culture ID Panel (Reflexed)     Status: Abnormal   Collection Time: 07/05/17  3:42 PM  Result Value Ref Range Status   Enterococcus species NOT DETECTED NOT DETECTED Final   Listeria monocytogenes NOT DETECTED NOT DETECTED Final   Staphylococcus species NOT DETECTED NOT DETECTED Final   Staphylococcus aureus NOT DETECTED NOT DETECTED Final   Streptococcus species DETECTED (A) NOT DETECTED  Final    Comment: Not Enterococcus species, Streptococcus agalactiae, Streptococcus pyogenes, or Streptococcus pneumoniae. CRITICAL RESULT CALLED TO, READ BACK BY AND VERIFIED WITH: PHARMD S HURTH [phone] MLM    Streptococcus agalactiae NOT DETECTED NOT DETECTED Final   Streptococcus [id number] NOT DETECTED NOT DETECTED Final   Streptococcus pyogenes NOT DETECTED NOT DETECTED Final   Acinetobacter baumannii NOT DETECTED NOT DETECTED Final   Enterobacteriaceae species NOT DETECTED NOT DETECTED Final   Enterobacter cloacae complex NOT DETECTED NOT DETECTED Final   Escherichia coli NOT DETECTED NOT DETECTED Final   Klebsiella oxytoca NOT DETECTED NOT DETECTED Final   Klebsiella pneumoniae NOT DETECTED NOT DETECTED Final   Proteus species NOT DETECTED NOT DETECTED Final   Serratia marcescens NOT DETECTED NOT DETECTED Final   Haemophilus influenzae NOT DETECTED NOT DETECTED Final   Neisseria meningitidis NOT DETECTED NOT DETECTED Final   Pseudomonas aeruginosa NOT DETECTED NOT DETECTED Final   Candida albicans NOT DETECTED NOT DETECTED Final   Candida glabrata NOT DETECTED NOT DETECTED Final   Candida krusei NOT DETECTED NOT DETECTED Final   Candida parapsilosis NOT DETECTED NOT DETECTED Final   Candida tropicalis NOT DETECTED NOT DETECTED Final    Comment: Performed at [facility], [address] [place], [place] [address]       Labs: Basic Metabolic Panel: Recent Labs  Lab 07/05/17 1345 07/06/17 0509 07/07/17 0614  NA 138 136 136  K 3.7 3.9 3.6  CL 105 105 105  CO2 22 21* 21*  GLUCOSE 108* 121* 86  BUN 17 21* 19  CREATININE 0.68 0.76 0.81  CALCIUM 8.7* 8.4* 8.3*   Liver Function Tests: Recent Labs  Lab 07/05/17 1345  AST 25  ALT 14  ALKPHOS 63  BILITOT 1.4*  PROT 6.7  ALBUMIN 3.5   No results for input(s): LIPASE, AMYLASE in the last 168 hours. No results for input(s): AMMONIA in the last 168 hours. CBC: Recent Labs  Lab 07/05/17 1345 07/06/17 0509 07/07/17 0614  WBC 21.7* 19.3* 14.1*  NEUTROABS 20.2*  --   --   HGB 12.4 11.7* 11.5*  HCT 39.1 36.7 36.1  MCV 88.3 87.0 88.3  PLT 197 163 141*   Cardiac Enzymes: No results for input(s): CKTOTAL, CKMB, CKMBINDEX, TROPONINI in the last 168 hours. BNP: Invalid input(s): POCBNP CBG: No results for input(s): GLUCAP in the last 168 hours.  Time coordinating discharge:  Greater than 30 minutes  Signed:  Catarina Hartshornavid Bobbie Virden, DO [facility] Pager: [phone] 07/09/2017, 8:32 AM

## 2017-07-09 NOTE — Care Management Important Message (Signed)
Important Message  Patient Details  Name: Sherald Bargeeggy J Brumm MRN: [id number] Date of Birth: 03/25/1935   Medicare Important Message Given:  Yes    Malcolm Metrohildress, Cesareo Vickrey Demske, RN 07/09/2017, 10:08 AM

## 2017-07-09 NOTE — Progress Notes (Signed)
Patient refused PICC line.  Dr. Schuyler AmorVerbally ordered to D/C order. Sending patient home on oral antibiotics.

## 2017-12-14 ENCOUNTER — Observation Stay (HOSPITAL_COMMUNITY)
Admission: EM | Admit: 2017-12-14 | Discharge: 2017-12-15 | Disposition: A | Discharge status: Home / Self Care | Payer: Medicare Other | Attending: Internal Medicine | Admitting: Internal Medicine

## 2017-12-14 ENCOUNTER — Encounter (HOSPITAL_COMMUNITY): Payer: Self-pay

## 2017-12-14 ENCOUNTER — Emergency Department (HOSPITAL_COMMUNITY): Payer: Medicare Other

## 2017-12-14 ENCOUNTER — Other Ambulatory Visit: Payer: Self-pay

## 2017-12-14 DIAGNOSIS — R079 Chest pain, unspecified: Secondary | ICD-10-CM | POA: Diagnosis present

## 2017-12-14 DIAGNOSIS — L02219 Cutaneous abscess of trunk, unspecified: Secondary | ICD-10-CM

## 2017-12-14 DIAGNOSIS — R0789 Other chest pain: Secondary | ICD-10-CM | POA: Diagnosis not present

## 2017-12-14 DIAGNOSIS — I5032 Chronic diastolic (congestive) heart failure: Secondary | ICD-10-CM | POA: Insufficient documentation

## 2017-12-14 DIAGNOSIS — L02211 Cutaneous abscess of abdominal wall: Secondary | ICD-10-CM | POA: Insufficient documentation

## 2017-12-14 DIAGNOSIS — R0602 Shortness of breath: Secondary | ICD-10-CM | POA: Insufficient documentation

## 2017-12-14 DIAGNOSIS — Z7901 Long term (current) use of anticoagulants: Secondary | ICD-10-CM | POA: Insufficient documentation

## 2017-12-14 DIAGNOSIS — Z86711 Personal history of pulmonary embolism: Secondary | ICD-10-CM | POA: Diagnosis present

## 2017-12-14 DIAGNOSIS — G20 Parkinson's disease: Secondary | ICD-10-CM | POA: Diagnosis not present

## 2017-12-14 DIAGNOSIS — L739 Follicular disorder, unspecified: Secondary | ICD-10-CM

## 2017-12-14 DIAGNOSIS — I2699 Other pulmonary embolism without acute cor pulmonale: Secondary | ICD-10-CM | POA: Diagnosis present

## 2017-12-14 LAB — PROTIME-INR
INR: 1.97
Prothrombin Time: 22.2 seconds — ABNORMAL HIGH (ref 11.4–15.2)

## 2017-12-14 LAB — TROPONIN I: Troponin I: <0.03 ng/mL (ref <0.03)

## 2017-12-14 LAB — CBC
HEMATOCRIT: 40.8 % (ref 36.0–46.0)
HEMOGLOBIN: 13.1 g/dL (ref 12.0–15.0)
MCH: 28.1 pg (ref 26.0–34.0)
MCHC: 32.1 g/dL (ref 30.0–36.0)
MCV: 87.6 fL (ref 78.0–100.0)
Platelets: 218 10*3/uL (ref 150–400)
RBC: 4.66 MIL/uL (ref 3.87–5.11)
RDW: 14.7 % (ref 11.5–15.5)
WBC: 7.3 10*3/uL (ref 4.0–10.5)

## 2017-12-14 LAB — BASIC METABOLIC PANEL
ANION GAP: 6 (ref 5–15)
BUN: 14 mg/dL (ref 8–23)
CHLORIDE: 108 mmol/L (ref 98–111)
CO2: 27 mmol/L (ref 22–32)
Calcium: 8.9 mg/dL (ref 8.9–10.3)
Creatinine, Ser: 0.64 mg/dL (ref 0.44–1.00)
GFR calc Af Amer: >60 mL/min (ref >60)
Glucose, Bld: 86 mg/dL (ref 70–99)
POTASSIUM: 3.7 mmol/L (ref 3.5–5.1)
SODIUM: 141 mmol/L (ref 135–145)

## 2017-12-14 LAB — D-DIMER, QUANTITATIVE: D-Dimer, Quant: 0.32 ug/mL-FEU (ref 0.00–0.50)

## 2017-12-14 MED ORDER — WARFARIN - PHARMACIST DOSING INPATIENT: Freq: Every day | Status: DC

## 2017-12-14 MED ORDER — WARFARIN SODIUM 2.5 MG PO TABS
2.5000 mg | ORAL_TABLET | Freq: Once | ORAL | Status: AC
  Administered 2017-12-15: 2.5 mg via ORAL
  Filled 2017-12-14 (×2): qty 1

## 2017-12-14 NOTE — ED Provider Notes (Signed)
[facility] EMERGENCY DEPARTMENT Provider Note   CSN: [id number] Arrival date & time: 12/14/17  1738     History   Chief Complaint Chief Complaint  Patient presents with  . Chest Pain    HPI [name] is a 83 y.o. female.  HPI Patient presents with chest pain.  She has had for the last few days.  Dull in the mid chest.  It is accompanied with shortness of breath.  Sometimes feels as if her heart will beat fast.  Question of heart rate of 200 for EMS.  History of severe Parkinson's disease.  On Coumadin for previous pulmonary embolism.  History of CHF in the past.  Also question of blood when going to the bathroom.  Per caregiver it was more "in the front".  Patient states that Dr. Emelda FearFerguson has removed a cyst previously. Past Medical History:  Diagnosis Date  . CHF (congestive heart failure) (HCC)   . History of pulmonary embolism   . Parkinson's disease (HCC)   . Venous stasis     Patient Active Problem List   Diagnosis Date Noted  . Streptococcal bacteremia 07/09/2017  . Cellulitis of lower extremity   . Bacteremia due to Gram-positive bacteria 07/06/2017  . Cellulitis and abscess of left leg 07/05/2017  . Vulvar abscess 07/05/2017  . Infected cyst of Bartholin's gland duct 07/05/2017  . Weakness 12/21/2014  . Leg weakness 12/20/2014  . Lower extremity weakness 12/20/2014  . Precordial pain 12/20/2014  . Diarrhea   . SOB (shortness of breath) 11/29/2014  . Chest pain 11/28/2014  . Pressure ulcer, heel 11/28/2014  . AKI (acute kidney injury) (HCC) 11/28/2014  . Chronic diastolic congestive heart failure (HCC) 11/28/2014  . Parkinson's disease (HCC) 11/28/2014  . Pain in the chest   . Encounter for therapeutic drug monitoring 09/15/2013  . Long term (current) use of anticoagulants 09/22/2010  . Pulmonary embolism (HCC) 08/25/2010  . Generalized weakness 08/16/2009  . LEG EDEMA 08/16/2009  . CONGESTIVE HEART FAILURE, HX OF 08/16/2009  . Venous (peripheral)  insufficiency 11/15/2008  . PULMONARY EMBOLISM, HX OF 11/15/2008    No past surgical history on file.   OB History   None      Home Medications    Prior to Admission medications   Medication Sig Start Date End Date Taking? Authorizing Provider  warfarin (COUMADIN) 2.5 MG tablet Take 1 tablet (2.5 mg total) by mouth daily at 6 PM. 2.5mg  on [facility] and 4mg  on FRI or Take as directed by anticoagulation clinic. Patient taking differently: Take 2.5-3.75 mg by mouth See admin instructions. Take one and one-half tablet on Fridays only. Take one tablet on all other days in the evening 10/29/16  Yes Lavera GuiseLiu, Dana Duo, MD    Family History Family History  Problem Relation Age of Onset  . Heart disease Mother        7590s    Social History Social History   Tobacco Use  . Smoking status: Never Smoker  . Smokeless tobacco: Never Used  Substance Use Topics  . Alcohol use: No  . Drug use: No     Allergies   Contrast media [iodinated diagnostic agents]; Vancomycin; Lasix [furosemide]; and Vioxx [rofecoxib]   Review of Systems Review of Systems  Constitutional: Negative for appetite change.  HENT: Negative for congestion.   Respiratory: Positive for shortness of breath.   Cardiovascular: Positive for chest pain and leg swelling.  Gastrointestinal: Negative for abdominal pain.  Genitourinary:       Question of vaginal bleeding versus rectal bleeding.  Musculoskeletal: Negative for back pain.  Skin: Negative for rash.  Neurological: Positive for tremors.     Physical Exam Updated Vital Signs BP (!) 144/73   Pulse 83   Temp 98.2 F (36.8 C) (Oral)   Resp 17   Ht 5\' 3"  (1.6 m)   Wt 68 kg (150 lb)   SpO2 95%   BMI 26.57 kg/m   Physical Exam  Constitutional: She appears well-developed.  HENT:  Head: Normocephalic.  Neck: Neck supple.  Cardiovascular: Regular rhythm.  Pulmonary/Chest: Effort normal. She has no wheezes. She has no rales.  Abdominal: There is no  tenderness.  Genitourinary:  Genitourinary Comments: Induration right suprapubic area.  There is a punctate area with bloody purulent drainage.  No fluctuance.  Some purulence expressed.  Musculoskeletal:  Patient with kyphosis.  Edema bilateral lower extremities.  Neurological: She is alert.  Patient with parkinsonian tremor.  Skin: Capillary refill takes less than 2 seconds.     ED Treatments / Results  Labs (all labs ordered are listed, but only abnormal results are displayed) Labs Reviewed  PROTIME-INR - Abnormal; Notable for the following components:      Result Value   Prothrombin Time 22.2 (*)    All other components within normal limits  BASIC METABOLIC PANEL  CBC  TROPONIN I  D-DIMER, QUANTITATIVE (NOT AT [facility])    EKG EKG Interpretation  Date/Time:  Tuesday December 14 2017 17:47:09 EDT Ventricular Rate:  82 PR Interval:    QRS Duration: 145 QT Interval:  422 QTC Calculation: 496 R Axis:   -65 Text Interpretation:  Sinus rhythm Short PR interval Probable left atrial enlargement RBBB and LAFB Left ventricular hypertrophy Artifact in lead(s) I II III aVR aVL aVF V1 Confirmed by Benjiman Core [phone]) on 12/14/2017 6:11:30 PM   Radiology Dg Chest Portable 1 View  Result Date: 12/14/2017 CLINICAL DATA:  Chest pain, tachycardia, CHF EXAM: PORTABLE CHEST 1 VIEW COMPARISON:  07/05/2017 FINDINGS: Lung apices obscured by the overlying head in a down position. Cardiomegaly evident without significant pneumonia, airspace process, edema, or effusion. No large pneumothorax. Minor basilar atelectasis. No significant interval change. IMPRESSION: Cardiomegaly with basilar atelectasis. Electronically Signed   By: Judie Petit.  Shick M.D.   On: 12/14/2017 18:21    Procedures Procedures (including critical care time)  Medications Ordered in ED Medications - No data to display   Initial Impression / Assessment and Plan / ED Course  I have reviewed the triage vital signs and the nursing  notes.  Pertinent labs & imaging results that were available during my care of the patient were reviewed by me and considered in my medical decision making (see chart for details).     Patient presented with shortness of breath chest pain and felt her heart going somewhat fast.  EKG is reassuring here.  However there was report of EMS auscultating a heart rate of 200.  There is question of if this was accurate or not or potentially could have been her tremors.  But with chest pain and potentially severe tachycardia think patient would benefit from admission.  Also antibiotics for suprapubic abscess.  Abscess is draining at this time.  Admit to hospitalist.  Final Clinical Impressions(s) / ED Diagnoses   Final diagnoses:  Nonspecific chest pain  Suprapubic abscess    ED Discharge Orders    None       Benjiman Core, MD 12/14/17  1953  

## 2017-12-14 NOTE — ED Triage Notes (Signed)
EMS reports pt has c/o chest pain for the past several days.  Reports has parkinsons disease.  Pt takes coumadin 2.5mg  daily.  Pt also has swelling to legs.  EMS ausculated  HR and reports was 200bpm.  Pt using a towel to help hold her head up.    Pt presently denies any pain but says heart feels like is beating fast.  EMS says initially pt's HR was in the 89's then increased to 165.

## 2017-12-14 NOTE — ED Notes (Signed)
MD at bedside. 

## 2017-12-14 NOTE — ED Notes (Signed)
Gave EKG to Dr.Pickering 

## 2017-12-14 NOTE — Progress Notes (Signed)
ANTICOAGULATION CONSULT NOTE - Initial Consult  Pharmacy Consult for warfarin Indication: VTE treatment  Allergies  Allergen Reactions  . Contrast Media [Iodinated Diagnostic Agents] Anaphylaxis and Shortness Of Breath  . Vancomycin Shortness Of Breath    REACTION: SOB  . Lasix [Furosemide]   . Vioxx [Rofecoxib]     REACTION: irregular heartbeat    Patient Measurements: Height: 5\' 3"  (160 cm) Weight: 150 lb (68 kg) IBW/kg (Calculated) : 52.4   Vital Signs: Temp: 98.2 F (36.8 C) (06/25 1738) Temp Source: Oral (06/25 1738) BP: 133/55 (06/25 2000) Pulse Rate: 83 (06/25 1830)  Labs: Recent Labs    12/14/17 1804  HGB 13.1  HCT 40.8  PLT 218  LABPROT 22.2*  INR 1.97  CREATININE 0.64  TROPONINI <0.03    Estimated Creatinine Clearance: 51 mL/min (by C-G formula based on SCr of 0.64 mg/dL).   Medical History: Past Medical History:  Diagnosis Date  . CHF (congestive heart failure) (HCC)   . History of pulmonary embolism   . Parkinson's disease (HCC)   . Venous stasis     Medications:   (Not in a hospital admission)  Assessment: Pharmacy consulted to dose warfarin for VTE treatment/history of pulmonary embolism. Patient's INR on admission is 1.97. Patient's home warfarin dosing is 3.75  mg on Fri and 2.5 mg ROW.  Goal of Therapy:  INR 2-3 Monitor platelets by anticoagulation protocol: Yes   Plan:  Warfarin 2.5 mg x 1 dose Monitor INR and s/s of bleeding  Tad MooreSteven C Dmoni Fortson 12/14/2017,9:01 PM

## 2017-12-14 NOTE — H&P (Signed)
History and Physical ER physician, Dr. Thyra Breed WUJ:[id number] DOB: 1934-11-13 DOA: 12/14/2017  Referring physician: ER Physician, Dr. Rubin Payor PCP: [name], MD  Outpatient Specialists:    Patient coming from: Home  Chief Complaint: Chest pain  HPI:  Patient is an 83 year old Caucasian female with past medical history significant for Parkinson's disease, congestive heart failure (could not specify if systolic, diastolic or combined systolic and diastolic), pulmonary embolism for which patient takes Coumadin and venous stasis.  Patient presents with a 2-week history of chest pain.  Chest pain is reported as intermittent, rated as 5 out of 10, sharp and radiates to the left upper extremity.  According to the patient, the chest pain became worse today.  There is associated shortness of breath.  No associated nausea or diaphoresis.  Patient is a poor historian.  First troponin is within normal range.  EKG has not shown any new changes.  Collateral information from the ER physician indicates that the EMS may have noted intermittent episodes where the patient's heart rate went from normal to 160 bpm and 200 bpm.  This finding has not been replicated in the ER.  No headache, patient has chronic flexion of the head with associated neck pain, and no fever or chills, no URI symptoms, no GI symptoms and no urinary symptoms.  Patient will be admitted for further assessment and management.  ED Course: Troponin is within normal range.  EKG has not shown any changes.  Hospitalist team has been consulted to admit patient. Pertinent labs: BMP revealed sodium of 141, potassium of 3.7, chloride 108, CO2 27, BUN of 14, creatinine of 0.64 with blood sugar of 86.  Troponin is less than 0.03. EKG: Independently reviewed.  Imaging: independently reviewed.   Review of Systems:  As in history of present illness.  10 systems were reviewed. Negative for fever, visual changes, sore throat, rash, new  muscle aches, dysuria, bleeding, n/v/abdominal pain.  Past Medical History:  Diagnosis Date  . CHF (congestive heart failure) (HCC)   . History of pulmonary embolism   . Parkinson's disease (HCC)   . Venous stasis     No past surgical history on file.   reports that she has never smoked. She has never used smokeless tobacco. She reports that she does not drink alcohol or use drugs.  Allergies  Allergen Reactions  . Contrast Media [Iodinated Diagnostic Agents] Anaphylaxis and Shortness Of Breath  . Vancomycin Shortness Of Breath    REACTION: SOB  . Lasix [Furosemide]   . Vioxx [Rofecoxib]     REACTION: irregular heartbeat    Family History  Problem Relation Age of Onset  . Heart disease Mother        90s     Prior to Admission medications   Medication Sig Start Date End Date Taking? Authorizing Provider  warfarin (COUMADIN) 2.5 MG tablet Take 1 tablet (2.5 mg total) by mouth daily at 6 PM. 2.5mg  on M T [facility] SUN and 4mg  on FRI or Take as directed by anticoagulation clinic. Patient taking differently: Take 2.5-3.75 mg by mouth See admin instructions. Take one and one-half tablet on Fridays only. Take one tablet on all other days in the evening 10/29/16  Yes Lavera Guise, MD    Physical Exam: Vitals:   12/14/17 1735 12/14/17 1738 12/14/17 1800 12/14/17 1830  BP:  (!) 152/82 (!) 164/80 (!) 144/73  Pulse: 83 88 84 83  Resp:  18 15 17   Temp: 98.2  F (36.8 C) 98.2 F (36.8 C)    TempSrc: Oral Oral    SpO2:  97% 96% 95%  Weight:      Height:         Constitutional:  . Appears calm and comfortable.  Patient has chronic flexion of the neck, likely resulting from complications of Parkinson's disease. Eyes:  . No pallor. No jaundice.  ENMT:  . external ears, nose appear normal Neck:  . No JVD Respiratory:  . CTA bilaterally, no w/r/r.  . Respiratory effort normal. No retractions or accessory muscle use Cardiovascular:  . S1S2 . Bilateral LE extremity edema,  with chronic skin changes. Abdomen:  . Abdomen is obese, soft and non tender. Organs are difficult to assess. Neurologic:  . Awake and alert. . Moves all limbs.  Wt Readings from Last 3 Encounters:  12/14/17 68 kg (150 lb)  07/05/17 68 kg (150 lb)  12/20/14 63.5 kg (140 lb)    I have personally reviewed following labs and imaging studies  Labs on Admission:  CBC: Recent Labs  Lab 12/14/17 1804  WBC 7.3  HGB 13.1  HCT 40.8  MCV 87.6  PLT 218   Basic Metabolic Panel: Recent Labs  Lab 12/14/17 1804  NA 141  K 3.7  CL 108  CO2 27  GLUCOSE 86  BUN 14  CREATININE 0.64  CALCIUM 8.9   Liver Function Tests: No results for input(s): AST, ALT, ALKPHOS, BILITOT, PROT, ALBUMIN in the last 168 hours. No results for input(s): LIPASE, AMYLASE in the last 168 hours. No results for input(s): AMMONIA in the last 168 hours. Coagulation Profile: Recent Labs  Lab 12/14/17 1804  INR 1.97   Cardiac Enzymes: Recent Labs  Lab 12/14/17 1804  TROPONINI <0.03   BNP (last 3 results) No results for input(s): PROBNP in the last 8760 hours. HbA1C: No results for input(s): HGBA1C in the last 72 hours. CBG: No results for input(s): GLUCAP in the last 168 hours. Lipid Profile: No results for input(s): CHOL, HDL, LDLCALC, TRIG, CHOLHDL, LDLDIRECT in the last 72 hours. Thyroid Function Tests: No results for input(s): TSH, T4TOTAL, FREET4, T3FREE, THYROIDAB in the last 72 hours. Anemia Panel: No results for input(s): VITAMINB12, FOLATE, FERRITIN, TIBC, IRON, RETICCTPCT in the last 72 hours. Urine analysis:    Component Value Date/Time   COLORURINE YELLOW 12/20/2014 2054   APPEARANCEUR CLEAR 12/20/2014 2054   LABSPEC 1.010 12/20/2014 2054   PHURINE 5.0 12/20/2014 2054   GLUCOSEU NEGATIVE 12/20/2014 2054   HGBUR TRACE (A) 12/20/2014 2054   BILIRUBINUR NEGATIVE 12/20/2014 2054   KETONESUR NEGATIVE 12/20/2014 2054   PROTEINUR NEGATIVE 12/20/2014 2054   UROBILINOGEN 0.2 12/20/2014  2054   NITRITE NEGATIVE 12/20/2014 2054   LEUKOCYTESUR TRACE (A) 12/20/2014 2054   Sepsis Labs: @LABRCNTIP (procalcitonin:4,lacticidven:4) )No results found for this or any previous visit (from the past 240 hour(s)).    Radiological Exams on Admission: Dg Chest Portable 1 View  Result Date: 12/14/2017 CLINICAL DATA:  Chest pain, tachycardia, CHF EXAM: PORTABLE CHEST 1 VIEW COMPARISON:  07/05/2017 FINDINGS: Lung apices obscured by the overlying head in a down position. Cardiomegaly evident without significant pneumonia, airspace process, edema, or effusion. No large pneumothorax. Minor basilar atelectasis. No significant interval change. IMPRESSION: Cardiomegaly with basilar atelectasis. Electronically Signed   By: Judie Petit.  Shick M.D.   On: 12/14/2017 18:21    EKG: Independently reviewed.   Active Problems:   Chest pain   Assessment/Plan 1. Chest pain. 2. Shortness of breath. 3. Parkinson's  disease. 4. History of pulmonary embolism on Coumadin. 5. History of congestive heart failure, type unknown.   Admit patient for further assessment and management. Cycle cardiac enzymes. Check cardiac BNP Cardiology consult Echocardiogram Consult pharmacy for Coumadin management  Further management will depend on hospital course.  DVT prophylaxis: Already on Coumadin. Code Status: Full Family Communication: Care giver. Disposition Plan: Home eventually Consults called: Cardiology will be consulted Admission status: Observation  Time spent: 62 minutes  Berton MountSylvester Grainger Mccarley, MD  [facility] Pager #: [phone] 7PM-7AM contact night coverage as above   12/14/2017, 8:37 PM

## 2017-12-14 NOTE — ED Notes (Signed)
Pt also reports she had some bright red blood on the toilet paper when she wiped before EMS arrived today.

## 2017-12-15 ENCOUNTER — Observation Stay (HOSPITAL_BASED_OUTPATIENT_CLINIC_OR_DEPARTMENT_OTHER): Payer: Medicare Other

## 2017-12-15 ENCOUNTER — Other Ambulatory Visit: Payer: Self-pay

## 2017-12-15 DIAGNOSIS — L739 Follicular disorder, unspecified: Secondary | ICD-10-CM | POA: Diagnosis not present

## 2017-12-15 DIAGNOSIS — R079 Chest pain, unspecified: Secondary | ICD-10-CM

## 2017-12-15 DIAGNOSIS — Z7901 Long term (current) use of anticoagulants: Secondary | ICD-10-CM

## 2017-12-15 DIAGNOSIS — R0789 Other chest pain: Secondary | ICD-10-CM | POA: Diagnosis not present

## 2017-12-15 DIAGNOSIS — I5030 Unspecified diastolic (congestive) heart failure: Secondary | ICD-10-CM | POA: Diagnosis not present

## 2017-12-15 LAB — ECHOCARDIOGRAM LIMITED
Height: 63 in
Weight: 2400 oz

## 2017-12-15 LAB — TROPONIN I
Troponin I: <0.03 ng/mL (ref <0.03)
Troponin I: <0.03 ng/mL (ref <0.03)

## 2017-12-15 LAB — BRAIN NATRIURETIC PEPTIDE: B Natriuretic Peptide: 133 pg/mL — ABNORMAL HIGH (ref 0.0–100.0)

## 2017-12-15 LAB — PROTIME-INR
INR: 2.06
Prothrombin Time: 23.1 seconds — ABNORMAL HIGH (ref 11.4–15.2)

## 2017-12-15 LAB — MAGNESIUM: Magnesium: 2 mg/dL (ref 1.7–2.4)

## 2017-12-15 LAB — PHOSPHORUS: Phosphorus: 3.2 mg/dL (ref 2.5–4.6)

## 2017-12-15 MED ORDER — TORSEMIDE 20 MG PO TABS
10.0000 mg | ORAL_TABLET | Freq: Once | ORAL | Status: DC
  Filled 2017-12-15: qty 1

## 2017-12-15 MED ORDER — DOXYCYCLINE HYCLATE 100 MG PO TABS: 100.0000 mg | ORAL_TABLET | Freq: Two times a day (BID) | ORAL | 0 refills | Status: AC

## 2017-12-15 MED ORDER — WARFARIN SODIUM 2.5 MG PO TABS: 2.5000 mg | ORAL_TABLET | Freq: Every day | ORAL | 0 refills | Status: DC

## 2017-12-15 MED ORDER — DOXYCYCLINE HYCLATE 100 MG PO TABS
100.0000 mg | ORAL_TABLET | Freq: Two times a day (BID) | ORAL | Status: DC
  Administered 2017-12-15: 100 mg via ORAL
  Filled 2017-12-15: qty 1

## 2017-12-15 MED ORDER — TORSEMIDE 20 MG PO TABS: 20.0000 mg | ORAL_TABLET | Freq: Once | ORAL | Status: DC

## 2017-12-15 MED ORDER — ACETAMINOPHEN 325 MG PO TABS
650.0000 mg | ORAL_TABLET | Freq: Four times a day (QID) | ORAL | Status: DC | PRN
  Administered 2017-12-15 (×2): 650 mg via ORAL
  Filled 2017-12-15 (×2): qty 2

## 2017-12-15 MED ORDER — WARFARIN SODIUM 2.5 MG PO TABS
2.5000 mg | ORAL_TABLET | Freq: Once | ORAL | Status: AC
  Administered 2017-12-15: 2.5 mg via ORAL
  Filled 2017-12-15: qty 1

## 2017-12-15 MED ORDER — WARFARIN SODIUM 2.5 MG PO TABS: 2.5000 mg | ORAL_TABLET | ORAL | Status: DC

## 2017-12-15 NOTE — Consult Note (Addendum)
Cardiology Consult    Patient ID: [name]; [id number]; 04/21/1935   Admit date: 12/14/2017 Date of Consult: 12/15/2017  Primary Care Provider: Benita StabileHall, John Z, MD Primary Cardiologist: Previously followed by Dr. Daleen SquibbWall in 2010  Patient Profile    [name] is a 83 y.o. female with past medical history of prior PE (on Coumadin for anticoagulation), Parkinson's Disease, and suprapubic abscess who is being seen today for the evaluation of chest pain at the request of Dr. Dartha Lodgegbata.   History of Present Illness    Ms. [name] presented to [facility] ED on 12/14/2017 for evaluation of chest discomfort over the past several days. Per EMS reports, heart rate was initially in the 80's but increased into the 160's during transit but this was possibly felt to be due to tremors.   In talking with the patient today, she reports that she has caregivers at home and is mostly dependent on them but does ambulate with a cane. She has chronic neck pain and is unable to lift her head. Over the past few weeks, she has noticed shortness of breath at rest and with activity.  She is unsure if she has experienced orthopnea or PND. Does have chronic lower extremity edema. Over the past several days, she describes a discomfort along her entire chest which has radiated into her arms bilaterally. At the time of this encounter, her chest discomfort has resolved but she reports pain along her right arm which she describes as a numbness and tingling sensation. She was taking Tylenol at home with improvement in her symptoms.  Initial labs showed WBC 7.3, Hgb 13.1, platelets 218, Na+ 141, K+ 3.7, and creatinine 0.64.  Initial and cyclic troponin values have been negative.  BNP elevated to 133.  D-dimer negative.  CXR shows cardiomegaly with basilar atelectasis. EKG shows a regular rhythm, HR 78, with a RBBB but interpretation is significantly hindered given the amount of artifact and it is difficult to discern p-waves.     Past Medical History:  Diagnosis Date  . CHF (congestive heart failure) (HCC)   . History of pulmonary embolism   . Parkinson's disease (HCC)   . Venous stasis     No past surgical history on file.   Home Medications:  Prior to Admission medications   Medication Sig Start Date End Date Taking? Authorizing Provider  warfarin (COUMADIN) 2.5 MG tablet Take 1 tablet (2.5 mg total) by mouth daily at 6 PM. 2.5mg  on M T [facility] S SUN and 4mg  on FRI or Take as directed by anticoagulation clinic. Patient taking differently: Take 2.5-3.75 mg by mouth See admin instructions. Take one and one-half tablet on Fridays only. Take one tablet on all other days in the evening 10/29/16  Yes Lavera GuiseLiu, Dana Duo, MD    Inpatient Medications: Scheduled Meds: . warfarin  2.5 mg Oral Once  . Warfarin - Pharmacist Dosing Inpatient   Does not apply q1800   Continuous Infusions:  PRN Meds: acetaminophen  Allergies:    Allergies  Allergen Reactions  . Contrast Media [Iodinated Diagnostic Agents] Anaphylaxis and Shortness Of Breath  . Vancomycin Shortness Of Breath    REACTION: SOB  . Lasix [Furosemide]   . Vioxx [Rofecoxib]     REACTION: irregular heartbeat    Social History:   Social History   Socioeconomic History  . Marital status: Single    Spouse name: Not on file  . Number of children: Not on file  . Years  of education: Not on file  . Highest education level: Not on file  Occupational History  . Not on file  Social Needs  . Financial resource strain: Not on file  . Food insecurity:    Worry: Not on file    Inability: Not on file  . Transportation needs:    Medical: Not on file    Non-medical: Not on file  Tobacco Use  . Smoking status: Never Smoker  . Smokeless tobacco: Never Used  Substance and Sexual Activity  . Alcohol use: No  . Drug use: No  . Sexual activity: Not on file  Lifestyle  . Physical activity:    Days per week: Not on file    Minutes per session: Not on file   . Stress: Not on file  Relationships  . Social connections:    Talks on phone: Not on file    Gets together: Not on file    Attends religious service: Not on file    Active member of club or organization: Not on file    Attends meetings of clubs or organizations: Not on file    Relationship status: Not on file  . Intimate partner violence:    Fear of current or ex partner: Not on file    Emotionally abused: Not on file    Physically abused: Not on file    Forced sexual activity: Not on file  Other Topics Concern  . Not on file  Social History Narrative  . Not on file     Family History:    Family History  Problem Relation Age of Onset  . Heart disease Mother        90s      Review of Systems    General:  No chills, fever, night sweats or weight changes.  Cardiovascular:  No orthopnea, palpitations, paroxysmal nocturnal dyspnea. Positive for chest pain and dyspnea on exertion.  Dermatological: No rash, lesions/masses Respiratory: No cough, Positive for dyspnea. Urologic: No hematuria, dysuria Abdominal:   No nausea, vomiting, diarrhea, bright red blood per rectum, melena, or hematemesis Neurologic:  No visual changes, wkns, changes in mental status.  All other systems reviewed and are otherwise negative except as noted above.  Physical Exam/Data    Vitals:   12/14/17 2000 12/14/17 2100 12/14/17 2318 12/15/17 0517  BP: (!) 133/55 136/67 136/62 134/65  Pulse:   85 71  Resp: 16 18 16 14   Temp:   98.5 F (36.9 C) 98.5 F (36.9 C)  TempSrc:   Oral Oral  SpO2: 95% 98% 97% 98%  Weight:      Height:       No intake or output data in the 24 hours ending 12/15/17 0917 Filed Weights   12/14/17 1732  Weight: 150 lb (68 kg)   Body mass index is 26.57 kg/m.   General: Pleasant, elderly Caucasian female appearing in NAD Psych: Normal affect. Neuro: Alert and oriented X 3. Moves all extremities spontaneously. HEENT: Unable to lift head.  Neck: Supple without  bruits or JVD. Lungs:  Resp regular and unlabored, decreased breath sounds along bases bilaterally. Heart: RRR no s3, s4, or murmurs. Abdomen: Soft, non-tender, non-distended, BS + x 4.  Extremities: No clubbing or cyanosis. 1+ pitting edema bilaterally. DP/PT/Radials 2+ and equal bilaterally.   EKG:  The EKG was personally reviewed and demonstrates: Regular rhythm, HR 78, with a RBBB but interpretation is significantly hindered given the amount of artifact and it is difficult to discern p-waves.  Labs/Studies     Relevant CV Studies:  None on File  Laboratory Data:  Chemistry Recent Labs  Lab 12/14/17 1804  NA 141  K 3.7  CL 108  CO2 27  GLUCOSE 86  BUN 14  CREATININE 0.64  CALCIUM 8.9  GFRNONAA >60  GFRAA >60  ANIONGAP 6    No results for input(s): PROT, ALBUMIN, AST, ALT, ALKPHOS, BILITOT in the last 168 hours. Hematology Recent Labs  Lab 12/14/17 1804  WBC 7.3  RBC 4.66  HGB 13.1  HCT 40.8  MCV 87.6  MCH 28.1  MCHC 32.1  RDW 14.7  PLT 218   Cardiac Enzymes Recent Labs  Lab 12/14/17 1804 12/15/17 0023 12/15/17 0302  TROPONINI <0.03 <0.03 <0.03   No results for input(s): TROPIPOC in the last 168 hours.  BNP Recent Labs  Lab 12/15/17 0023  BNP 133.0*    DDimer  Recent Labs  Lab 12/14/17 1804  DDIMER 0.32    Radiology/Studies:  Dg Chest Portable 1 View  Result Date: 12/14/2017 CLINICAL DATA:  Chest pain, tachycardia, CHF EXAM: PORTABLE CHEST 1 VIEW COMPARISON:  07/05/2017 FINDINGS: Lung apices obscured by the overlying head in a down position. Cardiomegaly evident without significant pneumonia, airspace process, edema, or effusion. No large pneumothorax. Minor basilar atelectasis. No significant interval change. IMPRESSION: Cardiomegaly with basilar atelectasis. Electronically Signed   By: Judie Petit.  Shick M.D.   On: 12/14/2017 18:21     Assessment & Plan    1. Chest Pain with Mixed Typical and Atypical Features/ Dyspnea on Exertion - The  patient is a poor historian but overall reports having worsening shortness of breath at rest and with activity for the past few weeks with episodes of chest discomfort at rest over the past several days which has radiated into her arms bilaterally. Describes this as a pins and needles sensation. - Initial and cyclic troponin values have been negative. BNP elevated to 133. D-dimer negative.  CXR shows cardiomegaly with basilar atelectasis. EKG shows a regular rhythm, HR 78, with a RBBB but interpretation is significantly hindered given the amount of artifact and it is difficult to discern p-waves.  - CHF is documented in her past medical history but she denies any formal diagnosis of this. An echocardiogram has already been ordered to assess LV function and wall motion. She does have decreased breath sounds along her bases and lower extremity pitting edema. She reports an allergy to Lasix and is unable to recall the specific reaction. Will dose PO Torsemide 10mg  once daily and reassess afterwards. If EF is noted to be reduced, would initially favor medical therapy in the setting of her poor functional status. Dr. Wyline Mood to see the patient later today.  2. History of PE - Occurred 10+ years ago by the patient's report. She has remained on Coumadin for anticoagulation and denies any evidence of active bleeding. Hemoglobin remained stable at 13.1.   3. Parkinson's Disease - Has an essential tremor by examination.  Does not appear she is on any medical therapy for this.   For questions or updates, please contact [facility] Please consult [email or web address] for contact info under Cardiology/STEMI.  Signed, Ellsworth Lennox, PA-C 12/15/2017, 9:17 AM Pager: [phone]  Attending note Patient seen and discussed with PA Iran Ouch, I agree with her documentation above. 83 yo female history of Parkinsons, PE on coumadin admitted with chest pain. Nonspecific mid, left sided, right sided sharp pain at times  with some SOB. Lasts just a few seconds. Has  had some overall increase SOB over the last 2 weeks.    There is some question if she was having tachycarrhythmias with EMS  K 3.7 Cr 0.64 WBC 7.3 Hgb 13.1 Plt 218 INR 1.97 Neg D-dimer Mg 2 BNP 133 Trop neg x 3 CXR cardiomegaly with atelectasis EKG poor quality tracing, SR, RBBB and LAFB  Patient presents with nonspecific chest pain symptoms. Poor data quality EKG due to her parkinsons, troponins are negative. She is severely debilitated from her Parkinsons, requiring significant assistance for all her ADLs. She is a poor candidate for any form of ischemic testing. Would follow up echo, pending results may be some options as far as medical therapy goes for her, she does have this unclear self reported history of CHF. Would not pursue ischemic testing at any point for her. Agree with diuresis at this time, with lasix allergy oral torsemide is fine option.    Dina Rich MD

## 2017-12-15 NOTE — Progress Notes (Signed)
*  PRELIMINARY RESULTS* Echocardiogram Limited 2-D Echocardiogram has been performed. Patient would not or could cooperate enough to obtain a  complete quality exam. Patient refused Definity.  Stacey DrainWhite, Letrice Pollok J 12/15/2017, 2:02 PM

## 2017-12-15 NOTE — Discharge Summary (Signed)
Physician Discharge Summary  Sherald Bargeeggy J Chopin [id number] DOB: 12/16/1934 DOA: 12/14/2017  PCP: Benita StabileHall, John Z, MD  Admit date: 12/14/2017 Discharge date: 12/15/2017  Time spent: 45 minutes  Recommendations for Outpatient Follow-up:  -To be discharged home today. -We will follow-up with her primary care provider in 2 weeks. -To take doxycycline for the next 8 weeks.  Discharge Diagnoses:  Principal Problem:   Chest pain Active Problems:   Folliculitis of perineum   Pulmonary embolism (HCC)   Long term (current) use of anticoagulants   Discharge Condition: Stable and improved  Filed Weights   12/14/17 1732  Weight: 68 kg (150 lb)    History of present illness:  As per Dr. Dartha Lodgegbata on 6/25: Patient is an 83 year old Caucasian female with past medical history significant for Parkinson's disease, congestive heart failure (could not specify if systolic, diastolic or combined systolic and diastolic), pulmonary embolism for which patient takes Coumadin and venous stasis.  Patient presents with a 2-week history of chest pain.  Chest pain is reported as intermittent, rated as 5 out of 10, sharp and radiates to the left upper extremity.  According to the patient, the chest pain became worse today.  There is associated shortness of breath.  No associated nausea or diaphoresis.  Patient is a poor historian.  First troponin is within normal range.  EKG has not shown any new changes.  Collateral information from the ER physician indicates that the EMS may have noted intermittent episodes where the patient's heart rate went from normal to 160 bpm and 200 bpm.  This finding has not been replicated in the ER.  No headache, patient has chronic flexion of the head with associated neck pain, and no fever or chills, no URI symptoms, no GI symptoms and no urinary symptoms.  Patient will be admitted for further assessment and management.  ED Course: Troponin is within normal range.  EKG has not shown any  changes.  Hospitalist team has been consulted to admit patient. Pertinent labs: BMP revealed sodium of 141, potassium of 3.7, chloride 108, CO2 27, BUN of 14, creatinine of 0.64 with blood sugar of 86.  Troponin is less than 0.03.    Hospital Course:   Chest pain -Resolved as of day of discharge. -Has ruled out for ACS with negative troponins and EKG without acute ischemic changes. -2D echo: Ejection fraction of 55 to 60%, normal wall motion, grade 1 diastolic dysfunction.  Study was technically difficult given her body habitus. -Seen by cardiology who agrees that given her age and multiple comorbidities she would not be a candidate for invasive cardiac testing and is recommending medical management only.  Vulvar folliculitis/Bartholin cyst infection -She has a history of this in the past. -She does have an area on her mons that appears to be an ingrown hair that has become infected and her entire right labia is edematous and erythematous.   -I do believe she will need antibiotics, will prescribe doxycycline for 8 days.  Parkinson's disease -Stable and at baseline.  History of pulmonary embolism -To continue warfarin, will need continued outpatient follow-up.  Procedures:  2D echo as above  Consultations:  cardiology  Discharge Instructions  Discharge Instructions    Diet - low sodium heart healthy   Complete by:  As directed    Increase activity slowly   Complete by:  As directed      Allergies as of 12/15/2017      Reactions   Contrast Media [iodinated Diagnostic  Agents] Anaphylaxis, Shortness Of Breath   Vancomycin Shortness Of Breath   REACTION: SOB   Lasix [furosemide]    Vioxx [rofecoxib]    REACTION: irregular heartbeat      Medication List    TAKE these medications   doxycycline 100 MG tablet Commonly known as:  VIBRA-TABS Take 1 tablet (100 mg total) by mouth every 12 (twelve) hours for 8 days.   warfarin 2.5 MG tablet Commonly known as:   COUMADIN Take 1 tablet (2.5 mg total) by mouth daily at 6 PM. 2.5mg  on M T [facility] TH S SUN and 4mg  on FRI or Take as directed by anticoagulation clinic. What changed:    how much to take  when to take this  additional instructions      Allergies  Allergen Reactions  . Contrast Media [Iodinated Diagnostic Agents] Anaphylaxis and Shortness Of Breath  . Vancomycin Shortness Of Breath    REACTION: SOB  . Lasix [Furosemide]   . Vioxx [Rofecoxib]     REACTION: irregular heartbeat   Follow-up Information    [name], MD. Schedule an appointment as soon as possible for a visit in 2 week(s).   Specialty:  Internal Medicine Contact information: [address] [place] [address] [phone]            The results of significant diagnostics from this hospitalization (including imaging, microbiology, ancillary and laboratory) are listed below for reference.    Significant Diagnostic Studies: Dg Chest Portable 1 View  Result Date: 12/14/2017 CLINICAL DATA:  Chest pain, tachycardia, CHF EXAM: PORTABLE CHEST 1 VIEW COMPARISON:  07/05/2017 FINDINGS: Lung apices obscured by the overlying head in a down position. Cardiomegaly evident without significant pneumonia, airspace process, edema, or effusion. No large pneumothorax. Minor basilar atelectasis. No significant interval change. IMPRESSION: Cardiomegaly with basilar atelectasis. Electronically Signed   By: Judie Petit.  Shick M.D.   On: 12/14/2017 18:21    Microbiology: No results found for this or any previous visit (from the past 240 hour(s)).   Labs: Basic Metabolic Panel: Recent Labs  Lab 12/14/17 1804 12/15/17 0023  NA 141  --   K 3.7  --   CL 108  --   CO2 27  --   GLUCOSE 86  --   BUN 14  --   CREATININE 0.64  --   CALCIUM 8.9  --   MG  --  2.0  PHOS  --  3.2   Liver Function Tests: No results for input(s): AST, ALT, ALKPHOS, BILITOT, PROT, ALBUMIN in the last 168 hours. No results for input(s): LIPASE, AMYLASE  in the last 168 hours. No results for input(s): AMMONIA in the last 168 hours. CBC: Recent Labs  Lab 12/14/17 1804  WBC 7.3  HGB 13.1  HCT 40.8  MCV 87.6  PLT 218   Cardiac Enzymes: Recent Labs  Lab 12/14/17 1804 12/15/17 0023 12/15/17 0302  TROPONINI <0.03 <0.03 <0.03   BNP: BNP (last 3 results) Recent Labs    12/15/17 0023  BNP 133.0*    ProBNP (last 3 results) No results for input(s): PROBNP in the last 8760 hours.  CBG: No results for input(s): GLUCAP in the last 168 hours.     Signed:  Chaya Jan  [facility] Pager: [phone] 12/15/2017, 5:34 PM

## 2017-12-15 NOTE — Progress Notes (Signed)
ANTICOAGULATION CONSULT NOTE - Initial Consult  Pharmacy Consult for warfarin Indication: VTE treatment  Allergies  Allergen Reactions  . Contrast Media [Iodinated Diagnostic Agents] Anaphylaxis and Shortness Of Breath  . Vancomycin Shortness Of Breath    REACTION: SOB  . Lasix [Furosemide]   . Vioxx [Rofecoxib]     REACTION: irregular heartbeat    Patient Measurements: Height: 5\' 3"  (160 cm) Weight: 150 lb (68 kg) IBW/kg (Calculated) : 52.4   Vital Signs: Temp: 98.5 F (36.9 C) (06/26 0517) Temp Source: Oral (06/26 0517) BP: 134/65 (06/26 0517) Pulse Rate: 71 (06/26 0517)  Labs: Recent Labs    12/14/17 1804 12/15/17 0023 12/15/17 0302  HGB 13.1  --   --   HCT 40.8  --   --   PLT 218  --   --   LABPROT 22.2*  --  23.1*  INR 1.97  --  2.06  CREATININE 0.64  --   --   TROPONINI <0.03 <0.03 <0.03    Estimated Creatinine Clearance: 51 mL/min (by C-G formula based on SCr of 0.64 mg/dL).   Medical History: Past Medical History:  Diagnosis Date  . CHF (congestive heart failure) (HCC)   . History of pulmonary embolism   . Parkinson's disease (HCC)   . Venous stasis     Medications:  Medications Prior to Admission  Medication Sig Dispense Refill Last Dose  . warfarin (COUMADIN) 2.5 MG tablet Take 1 tablet (2.5 mg total) by mouth daily at 6 PM. 2.5mg  on M T [facility] TH S SUN and 4mg  on FRI or Take as directed by anticoagulation clinic. (Patient taking differently: Take 2.5-3.75 mg by mouth See admin instructions. Take one and one-half tablet on Fridays only. Take one tablet on all other days in the evening) 30 tablet 0 12/13/2017 at 2000    Assessment: Pharmacy consulted to dose warfarin for VTE treatment/history of pulmonary embolism. Patient's INR therapeutic at 2.06. Patient's home warfarin dosing is 3.75  mg on Fri and 2.5 mg ROW.  Goal of Therapy:  INR 2-3 Monitor platelets by anticoagulation protocol: Yes   Plan:  Warfarin 2.5 mg x 1 dose Monitor INR and s/s  of bleeding  Tad MooreSteven C Garry Nicolini 12/15/2017,8:16 AM

## 2018-02-22 ENCOUNTER — Encounter (HOSPITAL_COMMUNITY): Payer: Self-pay | Admitting: *Deleted

## 2018-02-22 ENCOUNTER — Emergency Department (HOSPITAL_COMMUNITY): Payer: Medicare Other

## 2018-02-22 ENCOUNTER — Inpatient Hospital Stay (HOSPITAL_COMMUNITY)
Admission: EM | Admit: 2018-02-22 | Discharge: 2018-02-28 | DRG: 947 | Disposition: A | Discharge status: Home Health | Payer: Medicare Other | Attending: Internal Medicine | Admitting: Internal Medicine

## 2018-02-22 ENCOUNTER — Other Ambulatory Visit: Payer: Self-pay

## 2018-02-22 DIAGNOSIS — Z888 Allergy status to other drugs, medicaments and biological substances status: Secondary | ICD-10-CM

## 2018-02-22 DIAGNOSIS — G20 Parkinson's disease: Secondary | ICD-10-CM | POA: Diagnosis present

## 2018-02-22 DIAGNOSIS — R791 Abnormal coagulation profile: Secondary | ICD-10-CM | POA: Diagnosis not present

## 2018-02-22 DIAGNOSIS — I872 Venous insufficiency (chronic) (peripheral): Secondary | ICD-10-CM | POA: Diagnosis present

## 2018-02-22 DIAGNOSIS — G20A1 Parkinson's disease without dyskinesia, without mention of fluctuations: Secondary | ICD-10-CM | POA: Diagnosis present

## 2018-02-22 DIAGNOSIS — Z86711 Personal history of pulmonary embolism: Secondary | ICD-10-CM | POA: Diagnosis present

## 2018-02-22 DIAGNOSIS — R079 Chest pain, unspecified: Secondary | ICD-10-CM

## 2018-02-22 DIAGNOSIS — R202 Paresthesia of skin: Secondary | ICD-10-CM | POA: Diagnosis not present

## 2018-02-22 DIAGNOSIS — R0602 Shortness of breath: Secondary | ICD-10-CM

## 2018-02-22 DIAGNOSIS — I2699 Other pulmonary embolism without acute cor pulmonale: Secondary | ICD-10-CM | POA: Diagnosis present

## 2018-02-22 DIAGNOSIS — Z91041 Radiographic dye allergy status: Secondary | ICD-10-CM

## 2018-02-22 DIAGNOSIS — I5032 Chronic diastolic (congestive) heart failure: Secondary | ICD-10-CM | POA: Diagnosis present

## 2018-02-22 DIAGNOSIS — Z7901 Long term (current) use of anticoagulants: Secondary | ICD-10-CM

## 2018-02-22 DIAGNOSIS — R5381 Other malaise: Secondary | ICD-10-CM | POA: Diagnosis present

## 2018-02-22 NOTE — ED Triage Notes (Signed)
Pt c/o chest pain that started earlier today with sob,

## 2018-02-22 NOTE — ED Provider Notes (Signed)
[facility] EMERGENCY DEPARTMENT Provider Note   CSN: [id number] Arrival date & time: 02/22/18  2242     History   Chief Complaint Chief Complaint  Patient presents with  . Chest Pain    HPI [name] is a 83 y.o. female.  Patient is a poor historian.  Presents from home with episodes of "heart racing" associated with central chest pain lasting for several minutes at a time.  She does not have any chest pain currently.  Chest pain was associated with shortness of breath and nausea.  No diaphoresis.  No vomiting.  The pain is in the center of her chest and radiates to her mid back.  She reports that similar to the pain that she was admitted to the hospital for in June.  She was not told what was causing the pain then.  Has a history of CHF but denies any history of CAD.  She does take Coumadin for history of pulmonary embolism.  She also complains of constant tingling and numbness in her left hand that has been ongoing for the past 1 week.  She became concerned today because she had tingling and numbness in her right hand as well.  No difficulty speaking or difficulty swallowing.  No tingling or numbness in her legs.  No facial droop.  No headache.  States she has a PCP appointment tomorrow but did not think she could make it.  The history is provided by the patient.    Past Medical History:  Diagnosis Date  . CHF (congestive heart failure) (HCC)   . History of pulmonary embolism   . Parkinson's disease (HCC)   . Venous stasis     Patient Active Problem List   Diagnosis Date Noted  . Folliculitis of perineum 12/15/2017  . Streptococcal bacteremia 07/09/2017  . Cellulitis of lower extremity   . Bacteremia due to Gram-positive bacteria 07/06/2017  . Cellulitis and abscess of left leg 07/05/2017  . Vulvar abscess 07/05/2017  . Infected cyst of Bartholin's gland duct 07/05/2017  . Weakness 12/21/2014  . Leg weakness 12/20/2014  . Lower extremity weakness 12/20/2014  .  Precordial pain 12/20/2014  . Diarrhea   . SOB (shortness of breath) 11/29/2014  . Chest pain 11/28/2014  . Pressure ulcer, heel 11/28/2014  . AKI (acute kidney injury) (HCC) 11/28/2014  . Chronic diastolic congestive heart failure (HCC) 11/28/2014  . Parkinson's disease (HCC) 11/28/2014  . Pain in the chest   . Encounter for therapeutic drug monitoring 09/15/2013  . Long term (current) use of anticoagulants 09/22/2010  . Pulmonary embolism (HCC) 08/25/2010  . Generalized weakness 08/16/2009  . LEG EDEMA 08/16/2009  . CONGESTIVE HEART FAILURE, HX OF 08/16/2009  . Venous (peripheral) insufficiency 11/15/2008  . PULMONARY EMBOLISM, HX OF 11/15/2008    History reviewed. No pertinent surgical history.   OB History   None      Home Medications    Prior to Admission medications   Medication Sig Start Date End Date Taking? Authorizing Provider  warfarin (COUMADIN) 2.5 MG tablet Take 1 tablet (2.5 mg total) by mouth daily at 6 PM. 2.5mg  on M T [facility] SUN and 4mg  on FRI or Take as directed by anticoagulation clinic. Patient taking differently: Take 2.5-3.75 mg by mouth See admin instructions. 2.5mg  on all days, except take 3.75mg  on Fridays only 12/15/17  Yes Philip Aspen, Limmie Patricia, MD    Family History Family History  Problem Relation Age of Onset  . Heart disease  Mother        21s    Social History Social History   Tobacco Use  . Smoking status: Never Smoker  . Smokeless tobacco: Never Used  Substance Use Topics  . Alcohol use: No  . Drug use: No     Allergies   Contrast media [iodinated diagnostic agents]; Vancomycin; Lasix [furosemide]; and Vioxx [rofecoxib]   Review of Systems Review of Systems  Constitutional: Negative for activity change, appetite change and fever.  Eyes: Negative for visual disturbance.  Respiratory: Positive for chest tightness and shortness of breath.   Cardiovascular: Negative for chest pain.  Gastrointestinal: Positive for nausea.  Negative for abdominal pain and vomiting.  Genitourinary: Negative for dysuria, hematuria, vaginal bleeding and vaginal discharge.  Musculoskeletal: Negative for arthralgias and myalgias.  Neurological: Positive for weakness and numbness.  Psychiatric/Behavioral: Negative for agitation.    all other systems are negative except as noted in the HPI and PMH.    Physical Exam Updated Vital Signs BP (!) 158/81   Pulse 86   Temp 97.9 F (36.6 C) (Oral)   Resp 16   Ht 5\' 4"  (1.626 m)   Wt 68 kg   SpO2 100%   BMI 25.75 kg/m   Physical Exam  Constitutional: She is oriented to person, place, and time. She appears well-developed and well-nourished. No distress.  HENT:  Head: Normocephalic and atraumatic.  Mouth/Throat: Oropharynx is clear and moist. No oropharyngeal exudate.  Eyes: Pupils are equal, round, and reactive to light. Conjunctivae and EOM are normal.  Neck: Normal range of motion. Neck supple.  No meningismus. Severe kyphosis of C-spine, head held in flexed position  Cardiovascular: Normal rate, regular rhythm, normal heart sounds and intact distal pulses.  No murmur heard. Pulmonary/Chest: Effort normal and breath sounds normal. No respiratory distress. She exhibits no tenderness.  Abdominal: Soft. There is no tenderness. There is no rebound and no guarding.  Musculoskeletal: Normal range of motion. She exhibits no edema or tenderness.  Severe kyphosis  Neurological: She is alert and oriented to person, place, and time. No cranial nerve deficit. She exhibits normal muscle tone. Coordination normal.  No ataxia on finger to nose bilaterally. No pronator drift. 5/5 strength throughout. CN 2-12 intact.Equal grip strength. Sensation intact.  Parkinsonian tremors bilaterally.  Equal grip strength. Subjective tingling and numbness to hands bilaterally  Skin: Skin is warm.  Venous stasis changes of lower extremities  Psychiatric: She has a normal mood and affect. Her behavior is  normal.  Nursing note and vitals reviewed.    ED Treatments / Results  Labs (all labs ordered are listed, but only abnormal results are displayed) Labs Reviewed  BRAIN NATRIURETIC PEPTIDE - Abnormal; Notable for the following components:      Result Value   B Natriuretic Peptide 167.0 (*)    All other components within normal limits  PROTIME-INR - Abnormal; Notable for the following components:   Prothrombin Time 25.5 (*)    All other components within normal limits  BASIC METABOLIC PANEL  CBC  TROPONIN I  URINALYSIS, ROUTINE W REFLEX MICROSCOPIC  TROPONIN I  TROPONIN I    EKG EKG Interpretation  Date/Time:  Tuesday February 22 2018 22:51:43 EDT Ventricular Rate:  86 PR Interval:    QRS Duration: 131 QT Interval:  402 QTC Calculation: 481 R Axis:   -55 Text Interpretation:  Sinus rhythm Borderline short PR interval RBBB and LAFB Left ventricular hypertrophy Artifact in lead(s) I II III aVR aVL aVF V1  No significant change was found Confirmed by Glynn Octave [phone]) on 02/22/2018 11:20:20 PM   Radiology Dg Chest 2 View  Result Date: 02/23/2018 CLINICAL DATA:  Chest pain and shortness of breath. EXAM: CHEST - 2 VIEW COMPARISON:  12/14/2017 FINDINGS: Shallow inspiration with linear atelectasis in the right lung base. Cardiac enlargement. No vascular congestion, edema, or consolidation. No blunting of costophrenic angles. No pneumothorax. Degenerative changes in the spine with thoracic kyphosis and midthoracic vertebral compression deformities, unchanged since 12/20/2014. Calcification of the aorta. Degenerative changes in the shoulders. IMPRESSION: Shallow inspiration with linear atelectasis in the right lung base. Cardiac enlargement. No focal consolidation or edema. Electronically Signed   By: Burman Nieves M.D.   On: 02/23/2018 00:28    Procedures Procedures (including critical care time)  Medications Ordered in ED Medications - No data to display   Initial  Impression / Assessment and Plan / ED Course  I have reviewed the triage vital signs and the nursing notes.  Pertinent labs & imaging results that were available during my care of the patient were reviewed by me and considered in my medical decision making (see chart for details).    Patient with intermittent chest pain and racing heart feeling since yesterday.  Also has tingling in her bilateral hands ongoing for the past week.  EKG is artifactual but unchanged from previous. Cardiology consult from June reviewed.  Patient thought to be poor candidate for ischemic work-up and her chest pain was thought to be atypical.  Attempted to obtain CT of head and C-spine given patient's complaint of paresthesias in her hands and fingers.  She adamantly refuses CT scan stating that it makes her feel choked up.  Plan to the patient we cannot evaluate her for stroke or spinal pathology that could be causing her numbness without this test.  Troponin negative.  INR therapeutic at 2.3.  Low suspicion for pulmonary embolism or aortic dissection.\\ACS considered.  Cardiology consult in June did not recommend any further ischemic testing.  Patient continues to refuse CT imaging of her head and spine.  She understands that a stroke or spinal cord pathology could be best.  She is agreeable to stay for further evaluation of her chest pain.  Discussed with Dr. Robb Matar. Final Clinical Impressions(s) / ED Diagnoses   Final diagnoses:  Chest pain, unspecified type  Paresthesia    ED Discharge Orders    None       Marquail Bradwell, Jeannett Senior, MD 02/23/18 [phone]

## 2018-02-22 NOTE — ED Notes (Signed)
Pt transported to [facility], but told staff she refuses to have a CT Scan

## 2018-02-22 NOTE — ED Notes (Signed)
ED Provider at bedside. 

## 2018-02-23 ENCOUNTER — Observation Stay (HOSPITAL_BASED_OUTPATIENT_CLINIC_OR_DEPARTMENT_OTHER): Payer: Medicare Other

## 2018-02-23 DIAGNOSIS — R079 Chest pain, unspecified: Secondary | ICD-10-CM | POA: Diagnosis not present

## 2018-02-23 DIAGNOSIS — I361 Nonrheumatic tricuspid (valve) insufficiency: Secondary | ICD-10-CM | POA: Diagnosis not present

## 2018-02-23 DIAGNOSIS — R7989 Other specified abnormal findings of blood chemistry: Secondary | ICD-10-CM | POA: Diagnosis not present

## 2018-02-23 DIAGNOSIS — G20 Parkinson's disease: Secondary | ICD-10-CM | POA: Diagnosis not present

## 2018-02-23 DIAGNOSIS — I5032 Chronic diastolic (congestive) heart failure: Secondary | ICD-10-CM | POA: Diagnosis not present

## 2018-02-23 DIAGNOSIS — R0602 Shortness of breath: Secondary | ICD-10-CM

## 2018-02-23 DIAGNOSIS — I872 Venous insufficiency (chronic) (peripheral): Secondary | ICD-10-CM

## 2018-02-23 LAB — PROTIME-INR
INR: 2.35
Prothrombin Time: 25.5 seconds — ABNORMAL HIGH (ref 11.4–15.2)

## 2018-02-23 LAB — BASIC METABOLIC PANEL
Anion gap: 8 (ref 5–15)
BUN: 17 mg/dL (ref 8–23)
CALCIUM: 8.9 mg/dL (ref 8.9–10.3)
CHLORIDE: 106 mmol/L (ref 98–111)
CO2: 27 mmol/L (ref 22–32)
CREATININE: 0.75 mg/dL (ref 0.44–1.00)
GFR calc Af Amer: >60 mL/min (ref >60)
GFR calc non Af Amer: >60 mL/min (ref >60)
GLUCOSE: 83 mg/dL (ref 70–99)
Potassium: 4 mmol/L (ref 3.5–5.1)
Sodium: 141 mmol/L (ref 135–145)

## 2018-02-23 LAB — CBC
HCT: 44.1 % (ref 36.0–46.0)
Hemoglobin: 14.4 g/dL (ref 12.0–15.0)
MCH: 29 pg (ref 26.0–34.0)
MCHC: 32.7 g/dL (ref 30.0–36.0)
MCV: 88.9 fL (ref 78.0–100.0)
PLATELETS: 208 10*3/uL (ref 150–400)
RBC: 4.96 MIL/uL (ref 3.87–5.11)
RDW: 14.2 % (ref 11.5–15.5)
WBC: 6.8 10*3/uL (ref 4.0–10.5)

## 2018-02-23 LAB — ECHOCARDIOGRAM COMPLETE
HEIGHTINCHES: 64 in
Weight: 2546.75 oz

## 2018-02-23 LAB — TROPONIN I
Troponin I: <0.03 ng/mL (ref <0.03)
Troponin I: <0.03 ng/mL (ref <0.03)

## 2018-02-23 LAB — BRAIN NATRIURETIC PEPTIDE: B Natriuretic Peptide: 167 pg/mL — ABNORMAL HIGH (ref 0.0–100.0)

## 2018-02-23 MED ORDER — CARBIDOPA-LEVODOPA ER 25-100 MG PO TBCR
1.0000 | EXTENDED_RELEASE_TABLET | Freq: Two times a day (BID) | ORAL | Status: DC
  Filled 2018-02-23 (×2): qty 1

## 2018-02-23 MED ORDER — ASPIRIN EC 81 MG PO TBEC: 81.0000 mg | DELAYED_RELEASE_TABLET | Freq: Every day | ORAL | Status: DC

## 2018-02-23 MED ORDER — ACETAMINOPHEN 650 MG RE SUPP: 650.0000 mg | Freq: Four times a day (QID) | RECTAL | Status: DC | PRN

## 2018-02-23 MED ORDER — GI COCKTAIL ~~LOC~~: 30.0000 mL | Freq: Four times a day (QID) | ORAL | Status: DC | PRN

## 2018-02-23 MED ORDER — DILTIAZEM HCL ER COATED BEADS 120 MG PO CP24
120.0000 mg | ORAL_CAPSULE | Freq: Every day | ORAL | Status: DC
  Filled 2018-02-23 (×2): qty 1

## 2018-02-23 MED ORDER — FUROSEMIDE 20 MG PO TABS
20.0000 mg | ORAL_TABLET | Freq: Every day | ORAL | Status: DC
  Filled 2018-02-23 (×2): qty 1

## 2018-02-23 MED ORDER — GABAPENTIN 100 MG PO CAPS
100.0000 mg | ORAL_CAPSULE | Freq: Three times a day (TID) | ORAL | Status: DC
  Filled 2018-02-23 (×2): qty 1

## 2018-02-23 MED ORDER — NITROGLYCERIN 0.4 MG SL SUBL: 0.4000 mg | SUBLINGUAL_TABLET | SUBLINGUAL | Status: DC | PRN

## 2018-02-23 MED ORDER — WARFARIN SODIUM 2.5 MG PO TABS
2.5000 mg | ORAL_TABLET | Freq: Once | ORAL | Status: AC
  Administered 2018-02-23: 2.5 mg via ORAL
  Filled 2018-02-23: qty 1

## 2018-02-23 MED ORDER — ACETAMINOPHEN 325 MG PO TABS
ORAL_TABLET | ORAL | Status: AC
  Administered 2018-02-23: 650 mg via ORAL
  Filled 2018-02-23: qty 2

## 2018-02-23 MED ORDER — ACETAMINOPHEN 325 MG PO TABS
650.0000 mg | ORAL_TABLET | Freq: Four times a day (QID) | ORAL | Status: DC | PRN
  Administered 2018-02-23: 650 mg via ORAL

## 2018-02-23 MED ORDER — ALPRAZOLAM 0.25 MG PO TABS: 0.2500 mg | ORAL_TABLET | Freq: Two times a day (BID) | ORAL | Status: DC | PRN

## 2018-02-23 MED ORDER — ONDANSETRON HCL 4 MG/2ML IJ SOLN: 4.0000 mg | Freq: Four times a day (QID) | INTRAMUSCULAR | Status: DC | PRN

## 2018-02-23 MED ORDER — ZOLPIDEM TARTRATE 5 MG PO TABS: 5.0000 mg | ORAL_TABLET | Freq: Every evening | ORAL | Status: DC | PRN

## 2018-02-23 MED ORDER — METOPROLOL SUCCINATE ER 25 MG PO TB24: 12.5000 mg | ORAL_TABLET | Freq: Every day | ORAL | Status: DC

## 2018-02-23 MED ORDER — ROPINIROLE HCL ER 4 MG PO TB24
4.0000 mg | ORAL_TABLET | Freq: Every day | ORAL | Status: DC
  Filled 2018-02-23 (×2): qty 1

## 2018-02-23 MED ORDER — ACETAMINOPHEN 325 MG PO TABS
650.0000 mg | ORAL_TABLET | ORAL | Status: DC | PRN
  Administered 2018-02-23 – 2018-02-27 (×4): 650 mg via ORAL
  Filled 2018-02-23 (×4): qty 2

## 2018-02-23 MED ORDER — WARFARIN - PHARMACIST DOSING INPATIENT
Freq: Every day | Status: DC
  Administered 2018-02-23 – 2018-02-27 (×3)

## 2018-02-23 MED ORDER — ACETAMINOPHEN 325 MG PO TABS: 650.0000 mg | ORAL_TABLET | ORAL | Status: DC | PRN

## 2018-02-23 NOTE — Progress Notes (Signed)
ANTICOAGULATION CONSULT NOTE - Initial Consult  Pharmacy Consult for Coumadin Indication: pulmonary embolus  Allergies  Allergen Reactions  . Contrast Media [Iodinated Diagnostic Agents] Anaphylaxis and Shortness Of Breath  . Vancomycin Shortness Of Breath    REACTION: SOB  . Lasix [Furosemide]   . Vioxx [Rofecoxib]     REACTION: irregular heartbeat    Patient Measurements: Height: 5\' 4"  (162.6 cm) Weight: 159 lb 2.8 oz (72.2 kg) IBW/kg (Calculated) : 54.7  Vital Signs: Temp: 98.6 F (37 C) (09/04 0621) Temp Source: Oral (09/04 0621) BP: 129/69 (09/04 0621) Pulse Rate: 72 (09/04 0621)  Labs: Recent Labs    02/22/18 2321 02/23/18 0613  HGB 14.4  --   HCT 44.1  --   PLT 208  --   LABPROT 25.5*  --   INR 2.35  --   CREATININE 0.75  --   TROPONINI <0.03 <0.03    Estimated Creatinine Clearance: 53.7 mL/min (by C-G formula based on SCr of 0.75 mg/dL).   Medical History: Past Medical History:  Diagnosis Date  . CHF (congestive heart failure) (HCC)   . History of pulmonary embolism   . Parkinson's disease (HCC)   . Venous stasis     Medications:  Medications Prior to Admission  Medication Sig Dispense Refill Last Dose  . warfarin (COUMADIN) 2.5 MG tablet Take 1 tablet (2.5 mg total) by mouth daily at 6 PM. 2.5mg  on M T [facility] S SUN and 4mg  on FRI or Take as directed by anticoagulation clinic. (Patient taking differently: Take 2.5-3.75 mg by mouth See admin instructions. 2.5mg  on all days, except take 3.75mg  on Fridays only) 30 tablet 0 02/21/2018 at 1900    Assessment: 83 yo female who presented to ED with chest pain as well as tingling and numbness in her right hand. She take chronic coumadin for previous PE.. INR is therapeutic. Home dose: 2.5mg  on all days, except take 3.75mg  on Fridays only  Goal of Therapy:  INR 2-3 Monitor platelets by anticoagulation protocol: Yes   Plan:  Coumadin 2.5mg  x 1 today PT- INR daily Monitor for S/s of bleeding  Elder Cyphers, BS Loura Back, BCPS Clinical Pharmacist Pager [phone] 02/23/2018,10:46 AM

## 2018-02-23 NOTE — H&P (Addendum)
History and Physical    [name] GNF:[id number] DOB: [birth year] DOA: 02/22/2018  PCP: [name], MD  Patient coming from: normal  I have personally briefly reviewed patient's old medical records in [facility]  Chief Complaint: chest pain  HPI: [name] is a 83 y.o. female with medical history significant of chronic diastolic dysfunction/CHF but not on lasix, venous insufficiency, PE on coumadin, Parkinson's disease with dementia, p/w recurrent atypical chest pain. Per records, pt p/w similar chest pain associated with SOB, left arm numbness and tingling, palpitation in June this year, also with negative troponins and no EKG changes. INR is therapeutic and CXR was negative by then. ECHO showed normal EF but with grade 1 diastolic dysfunction. Pt was evaluated by cardiology who agreed that given her age and multiple co-morbidities, she would not be a candidate for invasive cardiac testing and is recommended medical management only. Patient is a poor historian. She's here today from home c/o heart racing associated with chest pain lasting for a few minutes, associated with SOB and nausea but no diaphoresis. The pain is in the center of her chest and radiates to her mid back. She also complains of constant tingling and numbness in her left hand that has been ongoing for the past 1 week. She also c/o some dysphagia and difficult to keep food down.  She's now chest pain free.     ED Course:  By my encounter, pt already has three sets of troponins negative. No ekg changes. INR 2.35 and CXR showed no infiltrate or pulmonary edema or pneumothorax. BNP 167, slightly higher than 133 three months ago. Renal function normal. Vital signs showed one time SVT 248 which is now normal; BP 158/81 initially but now 129/69.   Review of Systems: As per HPI otherwise 10 point review of systems negative.     Past Medical History:  Diagnosis Date  . CHF (congestive heart failure) (HCC)   . History  of pulmonary embolism   . Parkinson's disease (HCC)   . Venous stasis     History reviewed. No pertinent surgical history.   reports that she has never smoked. She has never used smokeless tobacco. She reports that she does not drink alcohol or use drugs.  Allergies  Allergen Reactions  . Contrast Media [Iodinated Diagnostic Agents] Anaphylaxis and Shortness Of Breath  . Vancomycin Shortness Of Breath    REACTION: SOB  . Lasix [Furosemide]   . Vioxx [Rofecoxib]     REACTION: irregular heartbeat    Family History  Problem Relation Age of Onset  . Heart disease Mother        90s      Prior to Admission medications   Medication Sig Start Date End Date Taking? Authorizing Provider  warfarin (COUMADIN) 2.5 MG tablet Take 1 tablet (2.5 mg total) by mouth daily at 6 PM. 2.5mg  on M T W TH S SUN and 4mg  on FRI or Take as directed by anticoagulation clinic. Patient taking differently: Take 2.5-3.75 mg by mouth See admin instructions. 2.5mg  on all days, except take 3.75mg  on Fridays only 12/15/17   Philip Aspen, Limmie Patricia, MD    Physical Exam: Vitals:   02/23/18 0100 02/23/18 0130 02/23/18 0350 02/23/18 0621  BP: (!) 154/72 131/80 134/75 129/69  Pulse: 85  88 72  Resp: 15 13 17 17   Temp:   (!) 97.5 F (36.4 C) 98.6 F (37 C)  TempSrc:   Oral Oral  SpO2: 96%  97% 99%  Weight:   72.2 kg   Height:        Constitutional: NAD, calm, comfortable Vitals:   02/23/18 0100 02/23/18 0130 02/23/18 0350 02/23/18 0621  BP: (!) 154/72 131/80 134/75 129/69  Pulse: 85  88 72  Resp: 15 13 17 17   Temp:   (!) 97.5 F (36.4 C) 98.6 F (37 C)  TempSrc:   Oral Oral  SpO2: 96%  97% 99%  Weight:   72.2 kg   Height:      General: NAD, BMI 27 Eyes: PERRL, lids and conjunctivae normal ENMT: Mucous membranes are moist. Posterior pharynx clear of any exudate or lesions.Normal dentition.  Neck: normal, supple, no masses, no thyromegaly Respiratory: clear to auscultation bilaterally, no  wheezing, no crackles. Normal respiratory effort. No accessory muscle use.  Cardiovascular: Regular rate and rhythm, no murmurs / rubs / gallops. No extremity edema. 2+ pedal pulses. No carotid bruits.  Abdomen: no tenderness, no masses palpated. No hepatosplenomegaly. Bowel sounds positive.  Musculoskeletal: no clubbing / cyanosis. No joint deformity upper and lower extremities. Decreased ROM due to high stiffness  Skin: no rashes, lesions, ulcers. No induration Neurologic: CN 2-12 grossly intact. Classic pill rolling actions in hands bilaterally; bilateral hands and feet with tremors; head tilted to left which is her baseline  Psychiatric: mild dementia but has good insight     Labs on Admission: I have personally reviewed following labs and imaging studies  CBC: Recent Labs  Lab 02/22/18 2321  WBC 6.8  HGB 14.4  HCT 44.1  MCV 88.9  PLT 208   Basic Metabolic Panel: Recent Labs  Lab 02/22/18 2321  NA 141  K 4.0  CL 106  CO2 27  GLUCOSE 83  BUN 17  CREATININE 0.75  CALCIUM 8.9   GFR: Estimated Creatinine Clearance: 53.7 mL/min (by C-G formula based on SCr of 0.75 mg/dL). Liver Function Tests: No results for input(s): AST, ALT, ALKPHOS, BILITOT, PROT, ALBUMIN in the last 168 hours. No results for input(s): LIPASE, AMYLASE in the last 168 hours. No results for input(s): AMMONIA in the last 168 hours. Coagulation Profile: Recent Labs  Lab 02/22/18 2321  INR 2.35   Cardiac Enzymes: Recent Labs  Lab 02/22/18 2321 02/23/18 0613 02/23/18 1233  TROPONINI <0.03 <0.03 <0.03   BNP (last 3 results) No results for input(s): PROBNP in the last 8760 hours. HbA1C: No results for input(s): HGBA1C in the last 72 hours. CBG: No results for input(s): GLUCAP in the last 168 hours. Lipid Profile: No results for input(s): CHOL, HDL, LDLCALC, TRIG, CHOLHDL, LDLDIRECT in the last 72 hours. Thyroid Function Tests: No results for input(s): TSH, T4TOTAL, FREET4, T3FREE, THYROIDAB  in the last 72 hours. Anemia Panel: No results for input(s): VITAMINB12, FOLATE, FERRITIN, TIBC, IRON, RETICCTPCT in the last 72 hours. Urine analysis:    Component Value Date/Time   COLORURINE YELLOW 12/20/2014 2054   APPEARANCEUR CLEAR 12/20/2014 2054   LABSPEC 1.010 12/20/2014 2054   PHURINE 5.0 12/20/2014 2054   GLUCOSEU NEGATIVE 12/20/2014 2054   HGBUR TRACE (A) 12/20/2014 2054   BILIRUBINUR NEGATIVE 12/20/2014 2054   KETONESUR NEGATIVE 12/20/2014 2054   PROTEINUR NEGATIVE 12/20/2014 2054   UROBILINOGEN 0.2 12/20/2014 2054   NITRITE NEGATIVE 12/20/2014 2054   LEUKOCYTESUR TRACE (A) 12/20/2014 2054    Radiological Exams on Admission: Dg Chest 2 View  Result Date: 02/23/2018 CLINICAL DATA:  Chest pain and shortness of breath. EXAM: CHEST - 2 VIEW COMPARISON:  12/14/2017 FINDINGS: Shallow  inspiration with linear atelectasis in the right lung base. Cardiac enlargement. No vascular congestion, edema, or consolidation. No blunting of costophrenic angles. No pneumothorax. Degenerative changes in the spine with thoracic kyphosis and midthoracic vertebral compression deformities, unchanged since 12/20/2014. Calcification of the aorta. Degenerative changes in the shoulders. IMPRESSION: Shallow inspiration with linear atelectasis in the right lung base. Cardiac enlargement. No focal consolidation or edema. Electronically Signed   By: Burman Nieves M.D.   On: 02/23/2018 00:28    EKG: Independently reviewed. No ST-T changes  Assessment/Plan Principal Problem:   Chest pain Active Problems:   Venous (peripheral) insufficiency   Chronic diastolic CHF (congestive heart failure) (HCC)   Pulmonary embolism (HCC)   Parkinson's disease (HCC)   SOB (shortness of breath)   Plan: -observation with telemetry overnight - per previous cardiology evaluation, based on  her age and multiple co-morbidities, she would not be a candidate for invasive cardiac testing and is recommended medical  management only. Given she has no EKG changes and troponin (-)X3, will pursue no more ischemic workup. Will prescribe SL nitro PRN. Given she's already on therapeutic dose of coumadin and her risk of falls and bleeding, will not add on aspirin -given her slightly more elevated BNP and accelerated HTN and one time hypoxia on RA, will add lasix 20 mg qd and Cardizem CD 120 mg (also for esophageal spasm from dysphagia) for her diastolic CHF; no ECHO change -will add neurontin 100 mg tid and add Requip for PD; not sure why pt is not on anti-parkinson disease medications? Add Sinemet low dose -coumadin dosing per pharmacy -PT eval -potential for discharge in am  DVT prophylaxis: on therapeutic dose of coumadin; SCD  Code Status: full code  Family Communication: family not present Disposition Plan: home  Consults called: none Admission status: obs   Gala Murdoch MD [facility] Pager [phone]  If 7PM-7AM, please contact night-coverage [email or web address] Password [email or web address]  02/23/2018, 1:40 PM

## 2018-02-23 NOTE — Progress Notes (Signed)
PT Cancellation Note  Patient Details Name: KITT COURSE MRN: [id number] DOB: [birth year]   Cancelled Treatment:    Reason Eval/Treat Not Completed: Other (comment).  Pt was agitated about her meal not being permitted by diet order, yelling at nurse and refused to allow PT to see her.  Try again in the AM.   Ivar Drape 02/23/2018, 6:36 PM   Samul Dada, PT MS Acute Rehab Dept. Number: [facility] [id number] and [facility] [phone]

## 2018-02-23 NOTE — Progress Notes (Signed)
*  PRELIMINARY RESULTS* Echocardiogram 2D Echocardiogram has been performed.  [name] 02/23/2018, 10:30 AM

## 2018-02-23 NOTE — Care Management Obs Status (Signed)
MEDICARE OBSERVATION STATUS NOTIFICATION   Patient Details  Name: [name] MRN: [id number] Date of Birth: [birth year]   Medicare Observation Status Notification Given:  Yes    Malcolm Metro, RN 02/23/2018, 4:17 PM

## 2018-02-24 DIAGNOSIS — R791 Abnormal coagulation profile: Secondary | ICD-10-CM | POA: Diagnosis present

## 2018-02-24 DIAGNOSIS — Z888 Allergy status to other drugs, medicaments and biological substances status: Secondary | ICD-10-CM | POA: Diagnosis not present

## 2018-02-24 DIAGNOSIS — I2782 Chronic pulmonary embolism: Secondary | ICD-10-CM | POA: Diagnosis not present

## 2018-02-24 DIAGNOSIS — I2699 Other pulmonary embolism without acute cor pulmonale: Secondary | ICD-10-CM | POA: Diagnosis present

## 2018-02-24 DIAGNOSIS — R5381 Other malaise: Secondary | ICD-10-CM | POA: Diagnosis present

## 2018-02-24 DIAGNOSIS — I5032 Chronic diastolic (congestive) heart failure: Secondary | ICD-10-CM | POA: Diagnosis not present

## 2018-02-24 DIAGNOSIS — G20 Parkinson's disease: Secondary | ICD-10-CM | POA: Diagnosis present

## 2018-02-24 DIAGNOSIS — Z7901 Long term (current) use of anticoagulants: Secondary | ICD-10-CM | POA: Diagnosis not present

## 2018-02-24 DIAGNOSIS — R202 Paresthesia of skin: Secondary | ICD-10-CM | POA: Diagnosis present

## 2018-02-24 DIAGNOSIS — Z86711 Personal history of pulmonary embolism: Secondary | ICD-10-CM | POA: Diagnosis not present

## 2018-02-24 DIAGNOSIS — R079 Chest pain, unspecified: Secondary | ICD-10-CM | POA: Diagnosis not present

## 2018-02-24 DIAGNOSIS — Z91041 Radiographic dye allergy status: Secondary | ICD-10-CM | POA: Diagnosis not present

## 2018-02-24 DIAGNOSIS — I872 Venous insufficiency (chronic) (peripheral): Secondary | ICD-10-CM | POA: Diagnosis present

## 2018-02-24 LAB — BASIC METABOLIC PANEL
Anion gap: 7 (ref 5–15)
BUN: 12 mg/dL (ref 8–23)
CO2: 25 mmol/L (ref 22–32)
CREATININE: 0.61 mg/dL (ref 0.44–1.00)
Calcium: 8.5 mg/dL — ABNORMAL LOW (ref 8.9–10.3)
Chloride: 109 mmol/L (ref 98–111)
GFR calc Af Amer: >60 mL/min (ref >60)
Glucose, Bld: 89 mg/dL (ref 70–99)
Potassium: 4.2 mmol/L (ref 3.5–5.1)
Sodium: 141 mmol/L (ref 135–145)

## 2018-02-24 LAB — PROTIME-INR
INR: 10.62 — AB
INR: >10
Prothrombin Time: 82.9 seconds — ABNORMAL HIGH (ref 11.4–15.2)

## 2018-02-24 MED ORDER — VITAMIN K1 10 MG/ML IJ SOLN
10.0000 mg | Freq: Once | INTRAVENOUS | Status: DC
  Filled 2018-02-24: qty 1

## 2018-02-24 NOTE — Evaluation (Signed)
Physical Therapy Evaluation Patient Details Name: MERCIE POERTNER MRN: [id number] DOB: Oct 08, 1934 Today's Date: 02/24/2018   History of Present Illness  Yong CHANTEE MUMMEY is a 83 y.o. female with medical history significant of chronic diastolic dysfunction/CHF but not on lasix, venous insufficiency, PE on coumadin, Parkinson's disease with dementia, p/w recurrent atypical chest pain. Per records, pt p/w similar chest pain associated with SOB, left arm numbness and tingling, palpitation in June this year, also with negative troponins and no EKG changes. INR is therapeutic and CXR was negative by then. ECHO showed normal EF but with grade 1 diastolic dysfunction. Pt was evaluated by cardiology who agreed that given her age and multiple co-morbidities, she would not be a candidate for invasive cardiac testing and is recommended medical management only. Patient is a poor historian. She's here today from home c/o heart racing associated with chest pain lasting for a few minutes, associated with SOB and nausea but no diaphoresis. The pain is in the center of her chest and radiates to her mid back. She also complains of constant tingling and numbness in her left hand that has been ongoing for the past 1 week. She also c/o some dysphagia and difficult to keep food down.  She's now chest pain free.     Clinical Impression  Patient easily agitated and distracted from completing functional task requiring much encouragement to participate with therapy.  Patient demonstrates slow labored movement for sitting up at bedside and limited to standing for up to 1 minute, but unable to take steps due to poor balance and BLE weakness.  Patient will benefit from continued physical therapy in hospital and recommended venue below to increase strength, balance, endurance for safe ADLs and gait.    Follow Up Recommendations SNF;Supervision/Assistance - 24 hour;Supervision for mobility/OOB    Equipment Recommendations  None  recommended by PT    Recommendations for Other Services       Precautions / Restrictions Precautions Precautions: Fall Restrictions Weight Bearing Restrictions: No      Mobility  Bed Mobility Overal bed mobility: Needs Assistance Bed Mobility: Supine to Sit;Sit to Supine     Supine to sit: Max assist Sit to supine: Max assist   General bed mobility comments: slow labored movement  Transfers Overall transfer level: Needs assistance Equipment used: 1 person hand held assist Transfers: Sit to/from Stand Sit to Stand: Max assist         General transfer comment: patient tolerated being pulled up using both hands, but unable to maintain standing balance  Ambulation/Gait                Stairs            Wheelchair Mobility    Modified Rankin (Stroke Patients Only)       Balance Overall balance assessment: Needs assistance Sitting-balance support: Feet supported;Bilateral upper extremity supported Sitting balance-Leahy Scale: Fair     Standing balance support: Bilateral upper extremity supported;During functional activity Standing balance-Leahy Scale: Poor Standing balance comment: with bilateral hand held assist                             Pertinent Vitals/Pain Pain Assessment: No/denies pain    Home Living Family/patient expects to be discharged to:: Private residence Living Arrangements: Other relatives Available Help at Discharge: Personal care attendant Type of Home: House Home Access: Level entry;Stairs to enter Entrance Stairs-Rails: None Entrance Stairs-Number of Steps: 1 Home Layout:  One level Home Equipment: Walker - 4 wheels;Wheelchair - manual;Grab bars - tub/shower      Prior Function Level of Independence: Needs assistance   Gait / Transfers Assistance Needed: Assisted ambulation with 4 wheeled walker short household distances  ADL's / Homemaking Assistance Needed: home aides from 8am to 4 pm and 4 pm to 8 pm x  7 days/week        Hand Dominance        Extremity/Trunk Assessment   Upper Extremity Assessment Upper Extremity Assessment: Generalized weakness    Lower Extremity Assessment Lower Extremity Assessment: Generalized weakness    Cervical / Trunk Assessment Cervical / Trunk Assessment: Kyphotic;Other exceptions Cervical / Trunk Exceptions: neck flexor contractures, unable to extend neck  Communication   Communication: No difficulties  Cognition Arousal/Alertness: Awake/alert Behavior During Therapy: WFL for tasks assessed/performed Overall Cognitive Status: Within Functional Limits for tasks assessed                                        General Comments      Exercises     Assessment/Plan    PT Assessment Patient needs continued PT services  PT Problem List Decreased strength;Decreased activity tolerance;Decreased balance;Decreased mobility;Decreased range of motion(decreased neck extension)       PT Treatment Interventions Gait training;Functional mobility training;Therapeutic activities;Therapeutic exercise;Patient/family education    PT Goals (Current goals can be found in the Care Plan section)  Acute Rehab PT Goals Patient Stated Goal: return home with home aides to assist PT Goal Formulation: With patient Time For Goal Achievement: 03/10/18 Potential to Achieve Goals: Fair    Frequency Min 3X/week   Barriers to discharge        Co-evaluation               AM-PAC PT "6 Clicks" Daily Activity  Outcome Measure Difficulty turning over in bed (including adjusting bedclothes, sheets and blankets)?: A Lot Difficulty moving from lying on back to sitting on the side of the bed? : A Lot Difficulty sitting down on and standing up from a chair with arms (e.g., wheelchair, bedside commode, etc,.)?: A Lot Help needed moving to and from a bed to chair (including a wheelchair)?: Total Help needed walking in hospital room?: Total Help  needed climbing 3-5 steps with a railing? : Total 6 Click Score: 9    End of Session   Activity Tolerance: Patient limited by fatigue;Treatment limited secondary to agitation Patient left: in bed;with call bell/phone within reach Nurse Communication: Mobility status PT Visit Diagnosis: Unsteadiness on feet (R26.81);Other abnormalities of gait and mobility (R26.89);Muscle weakness (generalized) (M62.81)    Time: 1308-6578 PT Time Calculation (min) (ACUTE ONLY): 37 min   Charges:   PT Evaluation $PT Eval Moderate Complexity: 1 Mod PT Treatments $Therapeutic Activity: 23-37 mins        2:05 PM, 02/24/18 Ocie Bob, MPT Physical Therapist with [facility] [phone] office [phone] mobile phone

## 2018-02-24 NOTE — Progress Notes (Signed)
Patient verbally abusive to staff, using profanity and being physically aggressive towards staff.  Spoke with patient regarding behavior,and stated that this was unacceptable.  Patient stated that she didn't care and wanted everyone to leave her room.  Reassured patient that staff was only trying to provide care and this behavior towards staff was inappropriate.

## 2018-02-24 NOTE — Plan of Care (Signed)
  Problem: Acute Rehab PT Goals(only PT should resolve) Goal: Pt Will Go Supine/Side To Sit Flowsheets (Taken 02/24/2018 1407) Pt will go Supine/Side to Sit: with moderate assist Goal: Patient Will Transfer Sit To/From Stand Flowsheets (Taken 02/24/2018 1407) Patient will transfer sit to/from stand: with moderate assist Goal: Pt Will Transfer Bed To Chair/Chair To Bed Flowsheets (Taken 02/24/2018 1407) Pt will Transfer Bed to Chair/Chair to Bed: with mod assist Goal: Pt Will Ambulate Flowsheets (Taken 02/24/2018 1407) Pt will Ambulate: 10 feet; with moderate assist; with rolling walker   2:07 PM, 02/24/18 Ocie Bob, MPT Physical Therapist with [facility] 336 [phone] office [phone] mobile phone

## 2018-02-24 NOTE — Progress Notes (Signed)
EKG completed and placed on chart 

## 2018-02-24 NOTE — Progress Notes (Signed)
ANTICOAGULATION CONSULT NOTE  Pharmacy Consult for Coumadin Indication: pulmonary embolus  Allergies  Allergen Reactions  . Contrast Media [Iodinated Diagnostic Agents] Anaphylaxis and Shortness Of Breath  . Vancomycin Shortness Of Breath    REACTION: SOB  . Lasix [Furosemide]   . Vioxx [Rofecoxib]     REACTION: irregular heartbeat    Patient Measurements: Height: 5\' 4"  (162.6 cm) Weight: 159 lb 2.8 oz (72.2 kg) IBW/kg (Calculated) : 54.7  Vital Signs: Temp: 97.6 F (36.4 C) (09/05 0300) Temp Source: Oral (09/05 0300) BP: 148/97 (09/05 0300) Pulse Rate: 87 (09/05 0300)  Labs: Recent Labs    02/22/18 2321 02/23/18 0613 02/23/18 1233 02/24/18 0835  HGB 14.4  --   --   --   HCT 44.1  --   --   --   PLT 208  --   --   --   LABPROT 25.5*  --   --  82.9*  INR 2.35  --   --  10.62*  CREATININE 0.75  --   --  0.61  TROPONINI <0.03 <0.03 <0.03  --     Estimated Creatinine Clearance: 53.7 mL/min (by C-G formula based on SCr of 0.61 mg/dL).   Medical History: Past Medical History:  Diagnosis Date  . CHF (congestive heart failure) (HCC)   . History of pulmonary embolism   . Parkinson's disease (HCC)   . Venous stasis     Medications:  Medications Prior to Admission  Medication Sig Dispense Refill Last Dose  . warfarin (COUMADIN) 2.5 MG tablet Take 1 tablet (2.5 mg total) by mouth daily at 6 PM. 2.5mg  on M T [facility] SUN and 4mg  on FRI or Take as directed by anticoagulation clinic. (Patient taking differently: Take 2.5-3.75 mg by mouth See admin instructions. 2.5mg  on all days, except take 3.75mg  on Fridays only) 30 tablet 0     Assessment: 83 yo female who presented to ED with chest pain as well as tingling and numbness in her right hand. She take chronic coumadin for previous PE.. INR was therapeutic on admission but now supratherapeutic at 10.62. Home dose: 2.5mg  on all days, except take 3.75mg  on Fridays only  Goal of Therapy:  INR 2-3 Monitor platelets by  anticoagulation protocol: Yes   Plan:  Hold coumadin today Administer Vitamin K 10 mg IV x 1 dose PT- INR daily Monitor for s/s of bleeding  Judeth Cornfield, PharmD Clinical Pharmacist 02/24/2018 12:23 PM

## 2018-02-24 NOTE — Progress Notes (Signed)
PROGRESS NOTE    Tara PETRELLA  Green:[id number] DOB: Aug 10, 1934 DOA: 02/22/2018 PCP: [name], MD   Brief Narrative:  [name] is a 83 y.o. female with medical history significant of chronic diastolic dysfunction/CHF but not on lasix, venous insufficiency, PE on coumadin, Parkinson's disease with dementia, p/w recurrent atypical chest pain. Per records, pt p/w similar chest pain associated with SOB, left arm numbness and tingling, palpitation in June this year, also with negative troponins and no EKG changes. INR is therapeutic on admission and CXR was negative by then. ECHO showed normal EF but with grade 1 diastolic dysfunction. Pt was evaluated by cardiology who agreed that given her age and multiple co-morbidities, she would not be a candidate for invasive cardiac testing and is recommended medical management only.   This time pt has three negative troponin, no EKG changes. INR therapeutic on admission. Clear CXR. Therefore no further ischemic cardiac workup pursued. And she's chest pain free.   For unknown reason, pt's INR accelerated to >10 without bleeding and she denies overdosing herself at home. Will give vitamin K 10 mg today and monitor INR in am.  Pt has 24/7 care at home but she felt she's too deconditioned and would like to go to rehab. Will need 3 nights stay.   Assessment & Plan:   Principal Problem:   Supratherapeutic INR Active Problems:   Venous (peripheral) insufficiency   Chronic diastolic CHF (congestive heart failure) (HCC)   Pulmonary embolism (HCC)   Parkinson's disease (HCC)   Physical debility   Plan: -no further ischemic cardiac workup warranted. Troponin (-)X3, no EKG changes. Clear CXR and INR therapeutic on admission. -supratherapeutic INR of unknown cause. No bleeding. Will give iv vitamin K 10 mg now. PT/INR daily until therapeutic. Hold off coumadin -CM/SW consulted and pt is interested in going to SNF. Will need 3 nights stay -she refused to  take anti-parkinson medications  DVT prophylaxis: coumadin supratherapeutic Code Status: full  Family Communication:no family available Disposition Plan:  STR   Consultants:   none  Procedures:none  Antimicrobials: none  Subjective: No chest pain or SOB. For unknown etiology, her INR>10 today and repeated lab showed the same result. She denies overdosing herself at home  Objective: Vitals:   02/23/18 0621 02/23/18 1358 02/23/18 2114 02/24/18 0300  BP: 129/69 106/64  (!) 148/97  Pulse: 72 92  87  Resp: 17 17  20   Temp: 98.6 F (37 C) (!) 97.3 F (36.3 C)  97.6 F (36.4 C)  TempSrc: Oral Oral  Oral  SpO2: 99% 97% 98% 93%  Weight:      Height:        Intake/Output Summary (Last 24 hours) at 02/24/2018 1430 Last data filed at 02/24/2018 1300 Gross per 24 hour  Intake 420 ml  Output -  Net 420 ml   Filed Weights   02/22/18 2245 02/23/18 0350  Weight: 68 kg 72.2 kg    Examination:  General: NAD, BMI 27 Eyes: PERRL, lids and conjunctivae normal ENMT: Mucous membranes are moist. Posterior pharynx clear of any exudate or lesions.Normal dentition.  Neck: normal, supple, no masses, no thyromegaly Respiratory: clear to auscultation bilaterally, no wheezing, no crackles. Normal respiratory effort. No accessory muscle use.  Cardiovascular: Regular rate and rhythm, no murmurs / rubs / gallops. No extremity edema. 2+ pedal pulses. No carotid bruits.  Abdomen: no tenderness, no masses palpated. No hepatosplenomegaly. Bowel sounds positive.  Musculoskeletal: no clubbing / cyanosis. No joint deformity  upper and lower extremities. Decreased ROM due to high stiffness  Skin: no rashes, lesions, ulcers. No induration Neurologic: CN 2-12 grossly intact. Classic pill rolling actions in hands bilaterally; bilateral hands and feet with tremors; head tilted to left which is her baseline  Psychiatric: mild dementia but has good insight    Data Reviewed: I have personally reviewed  following labs and imaging studies  CBC: Recent Labs  Lab 02/22/18 2321  WBC 6.8  HGB 14.4  HCT 44.1  MCV 88.9  PLT 208   Basic Metabolic Panel: Recent Labs  Lab 02/22/18 2321 02/24/18 0835  NA 141 141  K 4.0 4.2  CL 106 109  CO2 27 25  GLUCOSE 83 89  BUN 17 12  CREATININE 0.75 0.61  CALCIUM 8.9 8.5*   GFR: Estimated Creatinine Clearance: 53.7 mL/min (by C-G formula based on SCr of 0.61 mg/dL). Liver Function Tests: No results for input(s): AST, ALT, ALKPHOS, BILITOT, PROT, ALBUMIN in the last 168 hours. No results for input(s): LIPASE, AMYLASE in the last 168 hours. No results for input(s): AMMONIA in the last 168 hours. Coagulation Profile: Recent Labs  Lab 02/22/18 2321 02/24/18 0835 02/24/18 1325  INR 2.35 10.62* >10.00*   Cardiac Enzymes: Recent Labs  Lab 02/22/18 2321 02/23/18 0613 02/23/18 1233  TROPONINI <0.03 <0.03 <0.03   BNP (last 3 results) No results for input(s): PROBNP in the last 8760 hours. HbA1C: No results for input(s): HGBA1C in the last 72 hours. CBG: No results for input(s): GLUCAP in the last 168 hours. Lipid Profile: No results for input(s): CHOL, HDL, LDLCALC, TRIG, CHOLHDL, LDLDIRECT in the last 72 hours. Thyroid Function Tests: No results for input(s): TSH, T4TOTAL, FREET4, T3FREE, THYROIDAB in the last 72 hours. Anemia Panel: No results for input(s): VITAMINB12, FOLATE, FERRITIN, TIBC, IRON, RETICCTPCT in the last 72 hours. Sepsis Labs: No results for input(s): PROCALCITON, LATICACIDVEN in the last 168 hours.  No results found for this or any previous visit (from the past 240 hour(s)).       Radiology Studies: Dg Chest 2 View  Result Date: 02/23/2018 CLINICAL DATA:  Chest pain and shortness of breath. EXAM: CHEST - 2 VIEW COMPARISON:  12/14/2017 FINDINGS: Shallow inspiration with linear atelectasis in the right lung base. Cardiac enlargement. No vascular congestion, edema, or consolidation. No blunting of costophrenic  angles. No pneumothorax. Degenerative changes in the spine with thoracic kyphosis and midthoracic vertebral compression deformities, unchanged since 12/20/2014. Calcification of the aorta. Degenerative changes in the shoulders. IMPRESSION: Shallow inspiration with linear atelectasis in the right lung base. Cardiac enlargement. No focal consolidation or edema. Electronically Signed   By: Burman Nieves M.D.   On: 02/23/2018 00:28        Scheduled Meds: . Carbidopa-Levodopa ER  1 tablet Oral BID  . diltiazem  120 mg Oral Daily  . furosemide  20 mg Oral Daily  . gabapentin  100 mg Oral TID  . rOPINIRole  4 mg Oral QHS  . Warfarin - Pharmacist Dosing Inpatient   Does not apply q1800   Continuous Infusions: . phytonadione (VITAMIN K) IV Stopped (02/24/18 1206)     LOS: 0 days    Time spent: 35 min    Gala Murdoch, MD [facility] Pager [phone]  If 7PM-7AM, please contact night-coverage [email or web address] Password [email or web address] 02/24/2018, 2:30 PM

## 2018-02-25 ENCOUNTER — Inpatient Hospital Stay (HOSPITAL_COMMUNITY): Payer: Medicare Other

## 2018-02-25 ENCOUNTER — Encounter (HOSPITAL_COMMUNITY): Payer: Self-pay | Admitting: Radiology

## 2018-02-25 LAB — PROTIME-INR
INR: 1.77
Prothrombin Time: 20.5 seconds — ABNORMAL HIGH (ref 11.4–15.2)

## 2018-02-25 MED ORDER — WARFARIN SODIUM 2.5 MG PO TABS
2.5000 mg | ORAL_TABLET | Freq: Once | ORAL | Status: AC
  Administered 2018-02-25: 2.5 mg via ORAL
  Filled 2018-02-25 (×2): qty 1

## 2018-02-25 NOTE — Progress Notes (Signed)
PROGRESS NOTE    [name]  WJX:[id number] DOB: [birth year] DOA: 02/22/2018 PCP: [name], MD    Brief Narrative:  [name] is a 83 y.o. female with medical history significant of chronic diastolic dysfunction/CHF but not on lasix, venous insufficiency, PE on coumadin, Parkinson's disease with dementia, p/w recurrent atypical chest pain. INR is therapeutic on admission and CXR was negative by then. ECHO showed normal EF but with grade 1 diastolic dysfunction. Pt was evaluated by cardiology who agreed that given her age and multiple co-morbidities, she would not be a candidate for invasive cardiac testing and is recommended medical management only.   For unknown reason, pt's INR accelerated to >10 without bleeding and she denies overdosing herself at home. Will give vitamin K 10 mg today and monitor INR in am.  Pt has 24/7 care at home but she felt she's too deconditioned and would like to go to rehab.   Today pt feels tired and had sob earlier this am. She also reports lleg pain.    Assessment & Plan:   Principal Problem:   Supratherapeutic INR Active Problems:   Venous (peripheral) insufficiency   Chronic diastolic CHF (congestive heart failure) (HCC)   Pulmonary embolism (HCC)   Parkinson's disease (HCC)   Physical debility  Atypical chest pain:  At rest, intermittent, resolved at this time.  Negative troponins and EKG doed not show any ischemic changes.     Supra therpeutic iNR:  Corrected with vitamin K.  Currently therapeutic today.    Chronic diastolic heart failure:  She appears to be compensated.    Sob: unclear etiology.  Coughing when eating, get cxr for further evaluation.    Deconditioned and physical debility:  PT eval .    Parkinson's disease:    PUlmonary embolism:  Dose coumadin as per pharmacy.      DVT prophylaxis: coumadin.  Code Status: full code.  Family Communication: NONE at bedside.  Disposition Plan: pending further  evaluation of SOB.   Consultants:   None.    Procedures: none.    Antimicrobials: none.    Subjective: Pt reports she does not feel safe to go home.   Objective: Vitals:   02/24/18 0300 02/24/18 1438 02/24/18 2133 02/25/18 0630  BP: (!) 148/97 (!) 144/79 (!) 141/98 136/73  Pulse: 87 (!) 110 83 68  Resp: 20 20 18 20   Temp: 97.6 F (36.4 C)  98.1 F (36.7 C) 98.4 F (36.9 C)  TempSrc: Oral  Oral Oral  SpO2: 93% 98% 99% 95%  Weight:      Height:        Intake/Output Summary (Last 24 hours) at 02/25/2018 1429 Last data filed at 02/25/2018 1002 Gross per 24 hour  Intake 290 ml  Output 1400 ml  Net -1110 ml   Filed Weights   02/22/18 2245 02/23/18 0350  Weight: 68 kg 72.2 kg    Examination:  General exam: Appears anxious and reports does not feel safe to go home.  Respiratory system: diminished at bases, no wheezing or rhonchi.  Cardiovascular system: S1 & S2 heard, RRR. No JVD,No pedal edema. Gastrointestinal system: Abdomen is nondistended, soft and nontender. Central nervous system: Alert and oriented. Pin rolling in hands. Head tilted to the left.  Extremities: Symmetric 5 x 5 power. Skin: No rashes, lesions or ulcers Psychiatry:  Mood & affect appropriate.     Data Reviewed: I have personally reviewed following labs and imaging studies  CBC: Recent Labs  Lab 02/22/18 2321  WBC 6.8  HGB 14.4  HCT 44.1  MCV 88.9  PLT 208   Basic Metabolic Panel: Recent Labs  Lab 02/22/18 2321 02/24/18 0835  NA 141 141  K 4.0 4.2  CL 106 109  CO2 27 25  GLUCOSE 83 89  BUN 17 12  CREATININE 0.75 0.61  CALCIUM 8.9 8.5*   GFR: Estimated Creatinine Clearance: 53.7 mL/min (by C-G formula based on SCr of 0.61 mg/dL). Liver Function Tests: No results for input(s): AST, ALT, ALKPHOS, BILITOT, PROT, ALBUMIN in the last 168 hours. No results for input(s): LIPASE, AMYLASE in the last 168 hours. No results for input(s): AMMONIA in the last 168 hours. Coagulation  Profile: Recent Labs  Lab 02/22/18 2321 02/24/18 0835 02/24/18 1325 02/25/18 0523  INR 2.35 10.62* >10.00* 1.77   Cardiac Enzymes: Recent Labs  Lab 02/22/18 2321 02/23/18 0613 02/23/18 1233  TROPONINI <0.03 <0.03 <0.03   BNP (last 3 results) No results for input(s): PROBNP in the last 8760 hours. HbA1C: No results for input(s): HGBA1C in the last 72 hours. CBG: No results for input(s): GLUCAP in the last 168 hours. Lipid Profile: No results for input(s): CHOL, HDL, LDLCALC, TRIG, CHOLHDL, LDLDIRECT in the last 72 hours. Thyroid Function Tests: No results for input(s): TSH, T4TOTAL, FREET4, T3FREE, THYROIDAB in the last 72 hours. Anemia Panel: No results for input(s): VITAMINB12, FOLATE, FERRITIN, TIBC, IRON, RETICCTPCT in the last 72 hours. Sepsis Labs: No results for input(s): PROCALCITON, LATICACIDVEN in the last 168 hours.  No results found for this or any previous visit (from the past 240 hour(s)).       Radiology Studies: No results found.      Scheduled Meds: . diltiazem  120 mg Oral Daily  . furosemide  20 mg Oral Daily  . gabapentin  100 mg Oral TID  . warfarin  2.5 mg Oral Once  . Warfarin - Pharmacist Dosing Inpatient   Does not apply q1800   Continuous Infusions: . phytonadione (VITAMIN K) IV Stopped (02/24/18 1420)     LOS: 1 day    Time spent: 35 minutes.     Kathlen Mody, MD [facility] Pager [phone] If 7PM-7AM, please contact night-coverage [email or web address] Password [email or web address] 02/25/2018, 2:29 PM

## 2018-02-25 NOTE — Progress Notes (Signed)
Patient cooperative and very pleasant with staff this shift, however still refused to take medications.  Patient stated that she would work with PT tomorrow and take medication on tomorrow.

## 2018-02-25 NOTE — Progress Notes (Signed)
ANTICOAGULATION CONSULT NOTE  Pharmacy Consult for Coumadin Indication: pulmonary embolus  Allergies  Allergen Reactions  . Contrast Media [Iodinated Diagnostic Agents] Anaphylaxis and Shortness Of Breath  . Vancomycin Shortness Of Breath    REACTION: SOB  . Lasix [Furosemide]   . Vioxx [Rofecoxib]     REACTION: irregular heartbeat    Patient Measurements: Height: 5\' 4"  (162.6 cm) Weight: 159 lb 2.8 oz (72.2 kg) IBW/kg (Calculated) : 54.7  Vital Signs: Temp: 98.4 F (36.9 C) (09/06 0630) Temp Source: Oral (09/06 0630) BP: 136/73 (09/06 0630) Pulse Rate: 68 (09/06 0630)  Labs: Recent Labs    02/22/18 2321 02/23/18 0613 02/23/18 1233 02/24/18 0835 02/24/18 1325 02/25/18 0523  HGB 14.4  --   --   --   --   --   HCT 44.1  --   --   --   --   --   PLT 208  --   --   --   --   --   LABPROT 25.5*  --   --  82.9* >90.0* 20.5*  INR 2.35  --   --  10.62* >10.00* 1.77  CREATININE 0.75  --   --  0.61  --   --   TROPONINI <0.03 <0.03 <0.03  --   --   --     Estimated Creatinine Clearance: 53.7 mL/min (by C-G formula based on SCr of 0.61 mg/dL).   Medical History: Past Medical History:  Diagnosis Date  . CHF (congestive heart failure) (HCC)   . History of pulmonary embolism   . Parkinson's disease (HCC)   . Venous stasis     Medications:  Medications Prior to Admission  Medication Sig Dispense Refill Last Dose  . warfarin (COUMADIN) 2.5 MG tablet Take 1 tablet (2.5 mg total) by mouth daily at 6 PM. 2.5mg  on M T [facility] SUN and 4mg  on FRI or Take as directed by anticoagulation clinic. (Patient taking differently: Take 2.5-3.75 mg by mouth See admin instructions. 2.5mg  on all days, except take 3.75mg  on Fridays only) 30 tablet 0     Assessment: 83 yo female who presented to ED with chest pain as well as tingling and numbness in her right hand. She take chronic coumadin for previous PE. INR 10.62 on 9/5. Vitamin K 10 mg IV given x 1 dose and now INR is 1.77.  Home  dose: 2.5mg  on all days, except take 3.75mg  on Fridays only  Goal of Therapy:  INR 2-3 Monitor platelets by anticoagulation protocol: Yes   Plan:  Warfarin 2.5 mg x 1 dose PT- INR daily- f/u with coumadin clinic as outpatient Monitor for s/s of bleeding  Judeth Cornfield, PharmD Clinical Pharmacist 02/25/2018 11:02 AM

## 2018-02-25 NOTE — Care Management (Signed)
Pt from home, lives alone and has PD caregivers almost 24 hrs. (they leave after she goes to bed and return before she gets up, she never tries to get up at night.). Pt has increased weakness from this admission. She has extreme difficultly seeing d/t neck mobility and she is HOH. For this reason pt asks that anyone working with her come into her line of vision when talking. This is also the reason why she prefers for only person work with her at a time. She has trouble following commands or working with more than one person. This CM has sat within pt's live of vision and had nothing but pleasant interactions patient. Pt has expressed to this CM frustration with staff at not taking their time with her and not listening to what she is telling them she needs. Pt does have a delayed speech. Pt was very upset yesterday because she was ordered Vit K and would not take it until she discussed new medication with MD, She felt staff was forcing her to take the medications. After she spoke with MD she was agreeable to taking vit K. Pt concerned today that people are upset with her (patient begins to cry). This CM explained she should always respectful and follow the golden rule but should never apologize for advocating for herself. Pt voices concern today about INR level and not knowing why her INR went to high for no reason.  Pt interested in [facility] for STR. If unable to be placed she will go home with [facility]. And resumption of he previous care giving arrangement.

## 2018-02-25 NOTE — Care Management Important Message (Signed)
Important Message  Patient Details  Name: [name] MRN: [id number] Date of Birth: 03-06-1935   Medicare Important Message Given:  Yes    [name] 02/25/2018, 11:44 AM

## 2018-02-25 NOTE — Clinical Social Work Note (Signed)
Patient was denied at [facility] and will discharge home.   Lexx Monte, Juleen China, LCSW

## 2018-02-25 NOTE — Progress Notes (Signed)
PT Cancellation Note  Patient Details Name: [name] MRN: [id number] DOB: Sep 05, 1934   Cancelled Treatment:    Reason Eval/Treat Not Completed: Other (comment).  Pt refused and then was unavailable for therapy.  She was willing per nsg to work on therapy tomorrow but will depend on potential for PT to see her.  Ivar Drape 02/25/2018, 10:19 PM   10:20 PM, 02/25/18 Samul Dada, PT, MS Physical Therapist - [place] [phone] (Office)

## 2018-02-25 NOTE — Clinical Social Work Note (Signed)
Patient has been placed in admit status and now wishes to be considered at SNF.     LCSW to refer patient to [facility].    Jalaysia Lobb, Juleen China, LCSW

## 2018-02-26 DIAGNOSIS — R5381 Other malaise: Secondary | ICD-10-CM

## 2018-02-26 LAB — PROTIME-INR
INR: 2.02
PROTHROMBIN TIME: 22.7 s — AB (ref 11.4–15.2)

## 2018-02-26 MED ORDER — WARFARIN SODIUM 2.5 MG PO TABS
2.5000 mg | ORAL_TABLET | Freq: Once | ORAL | Status: AC
  Administered 2018-02-26: 2.5 mg via ORAL
  Filled 2018-02-26: qty 1

## 2018-02-26 NOTE — Progress Notes (Signed)
Late entry: Pt refused the food on her dinner tray but did eat a [place] & tomato sandwich & some fruit.

## 2018-02-26 NOTE — NC FL2 (Addendum)
[place] MEDICAID FL2 LEVEL OF CARE SCREENING TOOL     IDENTIFICATION  Patient Name: [name] Birthdate: [birth year] Sex: female Admission Date (Current Location): 02/22/2018  [facility] and [place] Number:  [employer] and Address:  [facility],  [address] [place] [address]      Provider Number: [id number]  Attending Physician Name and Address:  Kathlen Mody, MD  Relative Name and Phone Number:       Current Level of Care: Hospital Recommended Level of Care: Nursing Facility Prior Approval Number:    Date Approved/Denied:   PASRR Number: [id number]  Discharge Plan: SNF    Current Diagnoses: Patient Active Problem List   Diagnosis Date Noted  . Supratherapeutic INR 02/24/2018  . Physical debility 02/24/2018  . Folliculitis of perineum 12/15/2017  . Streptococcal bacteremia 07/09/2017  . Cellulitis of lower extremity   . Bacteremia due to Gram-positive bacteria 07/06/2017  . Cellulitis and abscess of left leg 07/05/2017  . Vulvar abscess 07/05/2017  . Infected cyst of Bartholin's gland duct 07/05/2017  . Weakness 12/21/2014  . Leg weakness 12/20/2014  . Lower extremity weakness 12/20/2014  . Precordial pain 12/20/2014  . Diarrhea   . Pressure ulcer, heel 11/28/2014  . AKI (acute kidney injury) (HCC) 11/28/2014  . Chronic diastolic congestive heart failure (HCC) 11/28/2014  . Parkinson's disease (HCC) 11/28/2014  . Pain in the chest   . Encounter for therapeutic drug monitoring 09/15/2013  . Long term (current) use of anticoagulants 09/22/2010  . Pulmonary embolism (HCC) 08/25/2010  . Generalized weakness 08/16/2009  . LEG EDEMA 08/16/2009  . Chronic diastolic CHF (congestive heart failure) (HCC) 08/16/2009  . Venous (peripheral) insufficiency 11/15/2008  . PULMONARY EMBOLISM, HX OF 11/15/2008    Orientation RESPIRATION BLADDER Height & Weight     Self, Time, Situation, Place  Normal Incontinent Weight: 159 lb 2.8 oz  (72.2 kg) Height:  5\' 4"  (162.6 cm)  BEHAVIORAL SYMPTOMS/MOOD NEUROLOGICAL BOWEL NUTRITION STATUS  (NA) (NA) Continent (Needs to be feed)  AMBULATORY STATUS COMMUNICATION OF NEEDS Skin   Total Care Verbally Normal                       Personal Care Assistance Level of Assistance  Bathing, Feeding, Dressing, Total care Bathing Assistance: Maximum assistance Feeding assistance: Maximum assistance Dressing Assistance: Maximum assistance Total Care Assistance: Maximum assistance   Functional Limitations Info  Sight(Will need to be in line of vision) Sight Info: Impaired Hearing Info: Adequate Speech Info: Impaired(delayed)    SPECIAL CARE FACTORS FREQUENCY  PT (By licensed PT)     PT Frequency: 3x week              Contractures Contractures Info: Not present    Additional Factors Info  Allergies   Allergies Info: Contrast Media (lodinated diagnostic agents),Vancomycin           Current Medications (02/26/2018):  This is the current hospital active medication list Current Facility-Administered Medications  Medication Dose Route Frequency Provider Last Rate Last Dose  . acetaminophen (TYLENOL) tablet 650 mg  650 mg Oral Q4H PRN Gala Murdoch, MD   650 mg at 02/24/18 0258   Or  . acetaminophen (TYLENOL) suppository 650 mg  650 mg Rectal Q6H PRN Gala Murdoch, MD      . ALPRAZolam Prudy Feeler) tablet 0.25 mg  0.25 mg Oral BID PRN Gala Murdoch, MD      . diltiazem (CARDIZEM CD) 24  hr capsule 120 mg  120 mg Oral Daily Gala Murdoch, MD      . furosemide (LASIX) tablet 20 mg  20 mg Oral Daily Gala Murdoch, MD      . gabapentin (NEURONTIN) capsule 100 mg  100 mg Oral TID Gala Murdoch, MD      . gi cocktail (Maalox,Lidocaine,Donnatal)  30 mL Oral QID PRN Gala Murdoch, MD      . nitroGLYCERIN (NITROSTAT) SL tablet 0.4 mg  0.4 mg Sublingual Q5 min PRN Gala Murdoch, MD      . ondansetron [facility]) injection 4 mg  4 mg Intravenous Q6H PRN Gala Murdoch, MD      .  phytonadione (VITAMIN K) 10 mg in dextrose 5 % 50 mL IVPB  10 mg Intravenous Once Gala Murdoch, MD   Stopped at 02/24/18 1420  . warfarin (COUMADIN) tablet 2.5 mg  2.5 mg Oral ONCE-1800 Coffee, Gerre Pebbles, [facility]      . Warfarin - Pharmacist Dosing Inpatient   Does not apply q1800 Gala Murdoch, MD      . zolpidem [facility]) tablet 5 mg  5 mg Oral QHS PRN Gala Murdoch, MD         Discharge Medications: Please see discharge summary for a list of discharge medications.  Relevant Imaging Results:  Relevant Lab Results:   Additional Information SS# [id number]  Arvin Collard, [place]

## 2018-02-26 NOTE — Progress Notes (Signed)
ANTICOAGULATION CONSULT NOTE  Pharmacy Consult for Coumadin Indication: pulmonary embolus  Allergies  Allergen Reactions  . Contrast Media [Iodinated Diagnostic Agents] Anaphylaxis and Shortness Of Breath  . Vancomycin Shortness Of Breath    REACTION: SOB  . Lasix [Furosemide]   . Vioxx [Rofecoxib]     REACTION: irregular heartbeat    Patient Measurements: Height: 5\' 4"  (162.6 cm) Weight: 159 lb 2.8 oz (72.2 kg) IBW/kg (Calculated) : 54.7  Vital Signs: Temp: 98.3 F (36.8 C) (09/07 0655) Temp Source: Oral (09/07 0655) BP: 134/57 (09/07 0655) Pulse Rate: 71 (09/07 0655)  Labs: Recent Labs    02/23/18 1233  02/24/18 0835 02/24/18 1325 02/25/18 0523 02/26/18 8786  LABPROT  --    < > 82.9* >90.0* 20.5* 22.7*  INR  --    < > 10.62* >10.00* 1.77 2.02  CREATININE  --   --  0.61  --   --   --   TROPONINI <0.03  --   --   --   --   --    < > = values in this interval not displayed.    Estimated Creatinine Clearance: 53.7 mL/min (by C-G formula based on SCr of 0.61 mg/dL).   Medical History: Past Medical History:  Diagnosis Date  . CHF (congestive heart failure) (HCC)   . History of pulmonary embolism   . Parkinson's disease (HCC)   . Venous stasis     Medications:  Medications Prior to Admission  Medication Sig Dispense Refill Last Dose  . warfarin (COUMADIN) 2.5 MG tablet Take 1 tablet (2.5 mg total) by mouth daily at 6 PM. 2.5mg  on M T W TH S SUN and 4mg  on FRI or Take as directed by anticoagulation clinic. (Patient taking differently: Take 2.5-3.75 mg by mouth See admin instructions. 2.5mg  on all days, except take 3.75mg  on Fridays only) 30 tablet 0     Assessment: 83 yo female who presented to ED with chest pain as well as tingling and numbness in her right hand. She take chronic coumadin for previous PE. INR 10.62 on 9/5. Vitamin K 10 mg IV given x 1 dose and now INR is 1.77.  Home dose: 2.5mg  on all days, except take 3.75mg  on Fridays only  Goal of  Therapy:  INR 2-3 Monitor platelets by anticoagulation protocol: Yes   Plan:  INR 2.02 therapeutic  Warfarin 2.5 mg x 1 dose PT- INR daily- f/u with coumadin clinic as outpatient Monitor for s/s of bleeding  Luan Pulling, PharmD, MBA, BCGP Clinical Pharmacist  02/26/2018 8:32 AM

## 2018-02-26 NOTE — Care Management Note (Signed)
Case Management Note  Patient Details  Name: [name] MRN: [id number] Date of Birth: 04/15/1935  Subjective/Objective:  Chest pain, CHF, Parkinson's Disease                  Action/Plan: Received referral for SNF Placement. NCM contacted CSW to follow up with attending for SNF placement. Per last CSW note, PNC denied for SNF.   Expected Discharge Date:  02/25/18               Expected Discharge Plan:  Skilled Nursing Facility  In-House Referral:  Clinical Social Work  Discharge planning Services  CM Consult  Post Acute Care Choice:  NA Choice offered to:  NA  DME Arranged:  N/A DME Agency:  NA  HH Arranged:  NA HH Agency:  NA  Status of Service:  In process, will continue to follow  If discussed at Long Length of Stay Meetings, dates discussed:    Additional Comments:  Elliot Cousin, RN 02/26/2018, 11:24 AM

## 2018-02-26 NOTE — Progress Notes (Signed)
PROGRESS NOTE    [name]  ZOX:[id number] DOB: July 02, 1934 DOA: 02/22/2018 PCP: [name], MD    Brief Narrative:  [name] is a 83 y.o. female with medical history significant of chronic diastolic dysfunction/CHF but not on lasix, venous insufficiency, PE on coumadin, Parkinson's disease with dementia, p/w recurrent atypical chest pain. INR is therapeutic on admission and CXR was negative by then. ECHO showed normal EF but with grade 1 diastolic dysfunction. Pt was evaluated by cardiology who agreed that given her age and multiple co-morbidities, she would not be a candidate for invasive cardiac testing and is recommended medical management only.   For unknown reason, pt's INR accelerated to >10 without bleeding and she denies overdosing herself at home. Will give vitamin K 10 mg today and monitor INR in am.  Pt has 24/7 care at home but she felt she's too deconditioned and would like to go to rehab.   NO NEW complaints today. Pt wants to go to rehab .    Assessment & Plan:   Principal Problem:   Supratherapeutic INR Active Problems:   Venous (peripheral) insufficiency   Chronic diastolic CHF (congestive heart failure) (HCC)   Pulmonary embolism (HCC)   Parkinson's disease (HCC)   Physical debility  Atypical chest pain:  At rest, intermittent, resolved at this time.  Negative troponins and EKG doed not show any ischemic changes.     Supra therpeutic iNR:  Corrected with vitamin K.  Currently therapeutic today.    Chronic diastolic heart failure:  She appears to be compensated.    Sob: unclear etiology.  Coughing when eating, get cxr for further evaluation.  CXR does not show any pneumonia, mild interstitial edema.    Deconditioned and physical debility:  PT eval recommending SNF. She was denies at [facility] But she is not safe for discharge home.    Parkinson's disease: stable.    PUlmonary embolism:  Dose coumadin as per pharmacy.      DVT  prophylaxis: coumadin.  Code Status: full code.  Family Communication: NONE at bedside.  Disposition Plan: pending further evaluation of SOB.   Consultants:   None.    Procedures: none.    Antimicrobials: none.    Subjective: Pt reports she does not feel safe to go home.   Objective: Vitals:   02/25/18 1455 02/25/18 2006 02/25/18 2059 02/26/18 0655  BP: (!) 168/91  123/72 (!) 134/57  Pulse: 86  91 71  Resp: 18  18 18   Temp:   98.7 F (37.1 C) 98.3 F (36.8 C)  TempSrc:   Oral Oral  SpO2: 94% 95% 99% 100%  Weight:      Height:        Intake/Output Summary (Last 24 hours) at 02/26/2018 1045 Last data filed at 02/25/2018 1700 Gross per 24 hour  Intake 240 ml  Output 675 ml  Net -435 ml   Filed Weights   02/22/18 2245 02/23/18 0350  Weight: 68 kg 72.2 kg    Examination:  General exam: Appears anxious and reports does not feel safe to go home.  Respiratory system: diminished at bases, no wheezing or rhonchi.  Cardiovascular system: S1 & S2 heard, RRR. No JVD,No pedal edema. Gastrointestinal system: Abdomen is nondistended, soft and nontender. Central nervous system: Alert and oriented. Pin rolling in hands. Head tilted to the left.  Extremities: Symmetric 5 x 5 power. Skin: No rashes, lesions or ulcers Psychiatry:  Mood & affect appropriate.  Data Reviewed: I have personally reviewed following labs and imaging studies  CBC: Recent Labs  Lab 02/22/18 2321  WBC 6.8  HGB 14.4  HCT 44.1  MCV 88.9  PLT 208   Basic Metabolic Panel: Recent Labs  Lab 02/22/18 2321 02/24/18 0835  NA 141 141  K 4.0 4.2  CL 106 109  CO2 27 25  GLUCOSE 83 89  BUN 17 12  CREATININE 0.75 0.61  CALCIUM 8.9 8.5*   GFR: Estimated Creatinine Clearance: 53.7 mL/min (by C-G formula based on SCr of 0.61 mg/dL). Liver Function Tests: No results for input(s): AST, ALT, ALKPHOS, BILITOT, PROT, ALBUMIN in the last 168 hours. No results for input(s): LIPASE, AMYLASE in the  last 168 hours. No results for input(s): AMMONIA in the last 168 hours. Coagulation Profile: Recent Labs  Lab 02/22/18 2321 02/24/18 0835 02/24/18 1325 02/25/18 0523 02/26/18 0608  INR 2.35 10.62* >10.00* 1.77 2.02   Cardiac Enzymes: Recent Labs  Lab 02/22/18 2321 02/23/18 0613 02/23/18 1233  TROPONINI <0.03 <0.03 <0.03   BNP (last 3 results) No results for input(s): PROBNP in the last 8760 hours. HbA1C: No results for input(s): HGBA1C in the last 72 hours. CBG: No results for input(s): GLUCAP in the last 168 hours. Lipid Profile: No results for input(s): CHOL, HDL, LDLCALC, TRIG, CHOLHDL, LDLDIRECT in the last 72 hours. Thyroid Function Tests: No results for input(s): TSH, T4TOTAL, FREET4, T3FREE, THYROIDAB in the last 72 hours. Anemia Panel: No results for input(s): VITAMINB12, FOLATE, FERRITIN, TIBC, IRON, RETICCTPCT in the last 72 hours. Sepsis Labs: No results for input(s): PROCALCITON, LATICACIDVEN in the last 168 hours.  No results found for this or any previous visit (from the past 240 hour(s)).       Radiology Studies: Dg Chest Port 1 View  Result Date: 02/25/2018 CLINICAL DATA:  Shortness of breath. History of CHF, pulmonary embolism, and Parkinson's disease. EXAM: PORTABLE CHEST 1 VIEW COMPARISON:  PA and lateral chest x-ray of August 22, 2017 FINDINGS: The lungs are mildly hypoinflated. The right hemidiaphragm remains elevated as compared to the left. The interstitial markings of both lungs are increased. The cardiac silhouette is enlarged and the pulmonary vascularity engorged. There is no definite pleural effusion. The bony thorax is unremarkable. IMPRESSION: CHF with mild interstitial edema.  No acute pneumonia. Electronically Signed   By: David  [place] M.D.   On: 02/25/2018 14:51        Scheduled Meds: . diltiazem  120 mg Oral Daily  . furosemide  20 mg Oral Daily  . gabapentin  100 mg Oral TID  . warfarin  2.5 mg Oral ONCE-1800  . Warfarin -  Pharmacist Dosing Inpatient   Does not apply q1800   Continuous Infusions: . phytonadione (VITAMIN K) IV Stopped (02/24/18 1420)     LOS: 2 days    Time spent: 35 minutes.     Kathlen Mody, MD [facility] Pager [phone] If 7PM-7AM, please contact night-coverage [email or web address] Password [email or web address] 02/26/2018, 10:45 AM

## 2018-02-27 DIAGNOSIS — I2782 Chronic pulmonary embolism: Secondary | ICD-10-CM

## 2018-02-27 DIAGNOSIS — I5032 Chronic diastolic (congestive) heart failure: Secondary | ICD-10-CM

## 2018-02-27 DIAGNOSIS — R079 Chest pain, unspecified: Secondary | ICD-10-CM

## 2018-02-27 DIAGNOSIS — G20 Parkinson's disease: Secondary | ICD-10-CM

## 2018-02-27 DIAGNOSIS — R791 Abnormal coagulation profile: Principal | ICD-10-CM

## 2018-02-27 LAB — PROTIME-INR
INR: 3.46
PROTHROMBIN TIME: 34.6 s — AB (ref 11.4–15.2)

## 2018-02-27 NOTE — Progress Notes (Signed)
PROGRESS NOTE    [name]  UYQ:[id number] DOB: 14-Dec-1934 DOA: 02/22/2018 PCP: [name], MD    Brief Narrative:  [name] is a 83 y.o. female with medical history significant of chronic diastolic dysfunction/CHF but not on lasix, venous insufficiency, PE on coumadin, Parkinson's disease with dementia, p/w recurrent atypical chest pain. INR is therapeutic on admission and CXR was negative by then. ECHO showed normal EF but with grade 1 diastolic dysfunction. Pt was evaluated by cardiology who agreed that given her age and multiple co-morbidities, she would not be a candidate for invasive cardiac testing and is recommended medical management only.   For unknown reason, pt's INR accelerated to >10 without bleeding and she denies overdosing herself at home. Will give vitamin K 10 mg today and monitor INR in am.  Pt has 24/7 care at home but she felt she's too deconditioned and would like to go to rehab.   Assessment & Plan:   Principal Problem:   Supratherapeutic INR Active Problems:   Venous (peripheral) insufficiency   Chronic diastolic CHF (congestive heart failure) (HCC)   Pulmonary embolism (HCC)   Parkinson's disease (HCC)   Physical debility  Atypical chest pain:  At rest, intermittent, resolved at this time.  Negative troponins and EKG doed not show any ischemic changes.     Supra therpeutic iNR:  Corrected with vitamin K.  Currently therapeutic today.    Acute on chronic diastolic congestive heart failure Chest x-ray shows mild interstitial edema.  Started on oral Lasix.  She has good urine output.  Overall shortness of breath appears to be improving.  Deconditioned and physical debility:  PT eval recommending SNF. She was denies at [facility]. But she is not safe for discharge home.    Parkinson's disease: stable.    PUlmonary embolism:  Dose coumadin as per pharmacy.      DVT prophylaxis: coumadin.  Code Status: full code.  Family Communication:  NONE at bedside.  Disposition Plan: Anticipate discharge to skilled nursing facility The next 24 to 48 hours  Consultants:   None.    Procedures: none.    Antimicrobials: none.    Subjective: Pt reports she does not feel safe to go home.   Objective: Vitals:   02/26/18 2032 02/26/18 2059 02/27/18 0500 02/27/18 1533  BP:   96/60 (!) 90/53  Pulse: 95  86 88  Resp:   17 19  Temp:   97.8 F (36.6 C) 97.9 F (36.6 C)  TempSrc:   Oral Oral  SpO2: 98% 99% 98% 99%  Weight:      Height:        Intake/Output Summary (Last 24 hours) at 02/27/2018 1827 Last data filed at 02/27/2018 1500 Gross per 24 hour  Intake 298.33 ml  Output 100 ml  Net 198.33 ml   Filed Weights   02/22/18 2245 02/23/18 0350  Weight: 68 kg 72.2 kg    Examination:  General exam: Appears anxious and reports does not feel safe to go home.  Respiratory system: diminished at bases, no wheezing or rhonchi.  Cardiovascular system: S1 & S2 heard, RRR. No JVD,No pedal edema. Gastrointestinal system: Abdomen is nondistended, soft and nontender. Central nervous system: Alert and oriented. Pin rolling in hands. Head tilted to the left.  Extremities: Symmetric 5 x 5 power. Skin: No rashes, lesions or ulcers Psychiatry:  Mood & affect appropriate.     Data Reviewed: I have personally reviewed following labs and imaging studies  CBC:  Recent Labs  Lab 02/22/18 2321  WBC 6.8  HGB 14.4  HCT 44.1  MCV 88.9  PLT 208   Basic Metabolic Panel: Recent Labs  Lab 02/22/18 2321 02/24/18 0835  NA 141 141  K 4.0 4.2  CL 106 109  CO2 27 25  GLUCOSE 83 89  BUN 17 12  CREATININE 0.75 0.61  CALCIUM 8.9 8.5*   GFR: Estimated Creatinine Clearance: 53.7 mL/min (by C-G formula based on SCr of 0.61 mg/dL). Liver Function Tests: No results for input(s): AST, ALT, ALKPHOS, BILITOT, PROT, ALBUMIN in the last 168 hours. No results for input(s): LIPASE, AMYLASE in the last 168 hours. No results for input(s):  AMMONIA in the last 168 hours. Coagulation Profile: Recent Labs  Lab 02/24/18 0835 02/24/18 1325 02/25/18 0523 02/26/18 0608 02/27/18 0613  INR 10.62* >10.00* 1.77 2.02 3.46   Cardiac Enzymes: Recent Labs  Lab 02/22/18 2321 02/23/18 0613 02/23/18 1233  TROPONINI <0.03 <0.03 <0.03   BNP (last 3 results) No results for input(s): PROBNP in the last 8760 hours. HbA1C: No results for input(s): HGBA1C in the last 72 hours. CBG: No results for input(s): GLUCAP in the last 168 hours. Lipid Profile: No results for input(s): CHOL, HDL, LDLCALC, TRIG, CHOLHDL, LDLDIRECT in the last 72 hours. Thyroid Function Tests: No results for input(s): TSH, T4TOTAL, FREET4, T3FREE, THYROIDAB in the last 72 hours. Anemia Panel: No results for input(s): VITAMINB12, FOLATE, FERRITIN, TIBC, IRON, RETICCTPCT in the last 72 hours. Sepsis Labs: No results for input(s): PROCALCITON, LATICACIDVEN in the last 168 hours.  No results found for this or any previous visit (from the past 240 hour(s)).       Radiology Studies: No results found.      Scheduled Meds: . diltiazem  120 mg Oral Daily  . furosemide  20 mg Oral Daily  . gabapentin  100 mg Oral TID  . Warfarin - Pharmacist Dosing Inpatient   Does not apply q1800   Continuous Infusions: . phytonadione (VITAMIN K) IV Stopped (02/24/18 1420)     LOS: 3 days    Time spent: 25 minutes.     Erick Blinks, MD [facility] Pager [phone] If 7PM-7AM, please contact night-coverage [email or web address] Password [facility] 02/27/2018, 6:27 PM

## 2018-02-27 NOTE — Progress Notes (Signed)
ANTICOAGULATION CONSULT NOTE  Pharmacy Consult for Coumadin Indication: pulmonary embolus  Allergies  Allergen Reactions  . Contrast Media [Iodinated Diagnostic Agents] Anaphylaxis and Shortness Of Breath  . Vancomycin Shortness Of Breath    REACTION: SOB  . Lasix [Furosemide]   . Vioxx [Rofecoxib]     REACTION: irregular heartbeat    Patient Measurements: Height: 5\' 4"  (162.6 cm) Weight: 159 lb 2.8 oz (72.2 kg) IBW/kg (Calculated) : 54.7  Vital Signs: Temp: 97.8 F (36.6 C) (09/08 0500) Temp Source: Oral (09/08 0500) BP: 96/60 (09/08 0500) Pulse Rate: 86 (09/08 0500)  Labs: Recent Labs    02/25/18 0523 02/26/18 0608 02/27/18 0613  LABPROT 20.5* 22.7* 34.6*  INR 1.77 2.02 3.46    Estimated Creatinine Clearance: 53.7 mL/min (by C-G formula based on SCr of 0.61 mg/dL).   Medical History: Past Medical History:  Diagnosis Date  . CHF (congestive heart failure) (HCC)   . History of pulmonary embolism   . Parkinson's disease (HCC)   . Venous stasis     Medications:  Medications Prior to Admission  Medication Sig Dispense Refill Last Dose  . warfarin (COUMADIN) 2.5 MG tablet Take 1 tablet (2.5 mg total) by mouth daily at 6 PM. 2.5mg  on M T [facility] SUN and 4mg  on FRI or Take as directed by anticoagulation clinic. (Patient taking differently: Take 2.5-3.75 mg by mouth See admin instructions. 2.5mg  on all days, except take 3.75mg  on Fridays only) 30 tablet 0     Assessment: 83 yo female who presented to ED with chest pain as well as tingling and numbness in her right hand. She take chronic coumadin for previous PE. INR 10.62 on 9/5. Vitamin K 10 mg IV given x 1 dose and now INR is 1.77.  Home dose: 2.5mg  on all days, except take 3.75mg  on Fridays only  Goal of Therapy:  INR 2-3 Monitor platelets by anticoagulation protocol: Yes  INR 3.45  Supratherapeutic   Plan:  Hold warfarin tonight PT- INR daily- f/u with coumadin clinic as outpatient Monitor for s/s of  bleeding  Luan Pulling, PharmD, MBA, BCGP Clinical Pharmacist  02/27/2018 8:40 AM

## 2018-02-28 LAB — CBC
HCT: 44.1 % (ref 36.0–46.0)
HEMOGLOBIN: 14 g/dL (ref 12.0–15.0)
MCH: 28.5 pg (ref 26.0–34.0)
MCHC: 31.7 g/dL (ref 30.0–36.0)
MCV: 89.6 fL (ref 78.0–100.0)
Platelets: 233 10*3/uL (ref 150–400)
RBC: 4.92 MIL/uL (ref 3.87–5.11)
RDW: 14 % (ref 11.5–15.5)
WBC: 5.7 10*3/uL (ref 4.0–10.5)

## 2018-02-28 LAB — PROTIME-INR
INR: 4.29
Prothrombin Time: 40.9 seconds — ABNORMAL HIGH (ref 11.4–15.2)

## 2018-02-28 LAB — BASIC METABOLIC PANEL
Anion gap: 4 — ABNORMAL LOW (ref 5–15)
BUN: 20 mg/dL (ref 8–23)
CHLORIDE: 107 mmol/L (ref 98–111)
CO2: 29 mmol/L (ref 22–32)
Calcium: 8.7 mg/dL — ABNORMAL LOW (ref 8.9–10.3)
Creatinine, Ser: 0.7 mg/dL (ref 0.44–1.00)
GFR calc Af Amer: >60 mL/min (ref >60)
GFR calc non Af Amer: >60 mL/min (ref >60)
GLUCOSE: 86 mg/dL (ref 70–99)
POTASSIUM: 4.6 mmol/L (ref 3.5–5.1)
Sodium: 140 mmol/L (ref 135–145)

## 2018-02-28 MED ORDER — FUROSEMIDE 20 MG PO TABS: 20.0000 mg | ORAL_TABLET | Freq: Every day | ORAL | 0 refills | Status: DC

## 2018-02-28 MED ORDER — GABAPENTIN 100 MG PO CAPS: 100.0000 mg | ORAL_CAPSULE | Freq: Three times a day (TID) | ORAL | 0 refills | Status: DC

## 2018-02-28 MED ORDER — WARFARIN SODIUM 2 MG PO TABS: 2.0000 mg | ORAL_TABLET | Freq: Every day | ORAL | 0 refills | Status: DC

## 2018-02-28 MED ORDER — DILTIAZEM HCL ER COATED BEADS 120 MG PO CP24: 120.0000 mg | ORAL_CAPSULE | Freq: Every day | ORAL | 0 refills | Status: DC

## 2018-02-28 NOTE — Progress Notes (Signed)
IV removed, 2x2 gauze and paper tape applied to site, patient tolerated well.  Called patient's sister, Quintella Reichert, to notify that patient is being discharged home.  Patient transported home by [facility], patient's caregiver Angelique Blonder, will be at patient's home when patient returns home.

## 2018-02-28 NOTE — Care Management Note (Signed)
Case Management Note  Patient Details  Name: [name] MRN: [id number] Date of Birth: 20-Apr-1935   Expected Discharge Date:  02/28/18               Expected Discharge Plan:  Home w Home Health Services  In-House Referral:  Clinical Social Work  Discharge planning Services  CM Consult  Post Acute Care Choice:  Home Health Choice offered to:  Patient  DME Arranged:    DME Agency:  NA  HH Arranged:  PT, RN HH Agency:  [facility]  Status of Service:  Completed, signed off  If discussed at Long Length of Stay Meetings, dates discussed:    Additional Comments: DC home today with [facility] services through [facility]. Pt aware HH has 48 hrs to make first visit. Bonita Quin, [facility] rep, given referral.   Malcolm Metro, RN 02/28/2018, 1:21 PM

## 2018-02-28 NOTE — Discharge Summary (Signed)
Physician Discharge Summary  [name] [id number]:[id number] DOB: [birth year] DOA: 02/22/2018  PCP: [name], MD  Admit date: 02/22/2018 Discharge date: 02/28/2018  Admitted From: home Disposition:  home  Recommendations for Outpatient Follow-up:  1. Follow up with PCP in 1-2 weeks 2. Please obtain BMP/CBC in one week 3. Recheck INR on 9/11. Restart coumadin at lower dose (2mg ) if INR is <3. Consider transitioning to DOAC after discussion with PCP 4. Consider referral to neurology to discuss further management of parkinsons   Home Health: home health PT, RN Equipment/Devices:  Discharge Condition: stable CODE STATUS:full code Diet recommendation:  Heart healthy  Brief/Interim Summary: 83 year old female with a history of chronic diastolic heart failure, pulmonary embolism on Coumadin, Parkinson's disease, presented to the hospital with atypical chest pain.  She ruled out for ACS with negative cardiac markers.  She was seen by cardiology who felt that with her advanced age and comorbidities, she would not be a candidate for invasive cardiac testing.  Medical management was recommended.  Hospital course was complicated by INR rising to greater than 10.  Reasons for this were unclear.  She did receive a dose of vitamin K.  INR had subsequently improved.  She reports that normally, her INR is fairly stable.  She has had several ups and downs to her hospital course.  I have suggested to transition the patient to Eliquis/Xarelto.  She does not wish to do this at this time and wants to discuss this with her primary care physician first.  Since her INR is greater than 4 at this time, will hold Coumadin for another 48 hours.  Will have home health check INR in the next 48 hours and if INR is less than 3, can resume Coumadin at lower dose.  She also had some decompensated heart failure and feel short of breath.  She received a dose of intravenous Lasix and was started on oral Lasix.  Overall shortness of  breath has improved and she is breathing comfortably on room air.  Will continue on oral Lasix at home.  Regarding [name], she is not on chronic medications.  She is been told in the past that if she starts these medications she will most certainly develop hallucinations.  I have asked her to follow-up with neurology to discuss this further since she may benefit from starting on Sinemet.  I offered to make a referral to neurology, but she wishes to discuss this further with her primary care physician prior to any referrals.  Was seen by physical therapy and recommended skilled nursing facility placement.  Since she was not accepted at [facility] nursing center, patient does not wish to be placed in any other facility and would rather discharge home.  She reports having 24-hour care at home.  Discharge Diagnoses:  Principal Problem:   Supratherapeutic INR Active Problems:   Venous (peripheral) insufficiency   Chronic diastolic CHF (congestive heart failure) (HCC)   Pulmonary embolism (HCC)   Parkinson's disease (HCC)   Physical debility    Discharge Instructions  Discharge Instructions    Diet - low sodium heart healthy   Complete by:  As directed    Increase activity slowly   Complete by:  As directed      Allergies as of 02/28/2018      Reactions   Contrast Media [iodinated Diagnostic Agents] Anaphylaxis, Shortness Of Breath   Vancomycin Shortness Of Breath   REACTION: SOB   Vioxx [rofecoxib]    REACTION: irregular  heartbeat      Medication List    TAKE these medications   diltiazem 120 MG 24 hr capsule Commonly known as:  CARDIZEM CD Take 1 capsule (120 mg total) by mouth daily. Start taking on:  03/01/2018   furosemide 20 MG tablet Commonly known as:  LASIX Take 1 tablet (20 mg total) by mouth daily. Start taking on:  03/01/2018   gabapentin 100 MG capsule Commonly known as:  NEURONTIN Take 1 capsule (100 mg total) by mouth 3 (three) times daily.    warfarin 2 MG tablet Commonly known as:  COUMADIN Take 1 tablet (2 mg total) by mouth daily at 6 PM. Take one tab daily or Take as directed by anticoagulation clinic. Start after 9/11 if INR<3 What changed:    medication strength  how much to take  additional instructions       Allergies  Allergen Reactions  . Contrast Media [Iodinated Diagnostic Agents] Anaphylaxis and Shortness Of Breath  . Vancomycin Shortness Of Breath    REACTION: SOB  . Vioxx [Rofecoxib]     REACTION: irregular heartbeat    Consultations:     Procedures/Studies: Dg Chest 2 View  Result Date: 02/23/2018 CLINICAL DATA:  Chest pain and shortness of breath. EXAM: CHEST - 2 VIEW COMPARISON:  12/14/2017 FINDINGS: Shallow inspiration with linear atelectasis in the right lung base. Cardiac enlargement. No vascular congestion, edema, or consolidation. No blunting of costophrenic angles. No pneumothorax. Degenerative changes in the spine with thoracic kyphosis and midthoracic vertebral compression deformities, unchanged since 12/20/2014. Calcification of the aorta. Degenerative changes in the shoulders. IMPRESSION: Shallow inspiration with linear atelectasis in the right lung base. Cardiac enlargement. No focal consolidation or edema. Electronically Signed   By: Burman Nieves M.D.   On: 02/23/2018 00:28   Dg Chest Port 1 View  Result Date: 02/25/2018 CLINICAL DATA:  Shortness of breath. History of CHF, pulmonary embolism, and Parkinson's disease. EXAM: PORTABLE CHEST 1 VIEW COMPARISON:  PA and lateral chest x-ray of August 22, 2017 FINDINGS: The lungs are mildly hypoinflated. The right hemidiaphragm remains elevated as compared to the left. The interstitial markings of both lungs are increased. The cardiac silhouette is enlarged and the pulmonary vascularity engorged. There is no definite pleural effusion. The bony thorax is unremarkable. IMPRESSION: CHF with mild interstitial edema.  No acute pneumonia.  Electronically Signed   By: David  [place] M.D.   On: 02/25/2018 14:51       Subjective: Denies shortness of breath. Feels weak. Would rather go home instead of any other SNF other than [facility]  Discharge Exam: Vitals:   02/27/18 1533 02/27/18 2034 02/27/18 2123 02/28/18 0636  BP: (!) 90/53  (!) 123/59 127/61  Pulse: 88  73 70  Resp: 19  16 18   Temp: 97.9 F (36.6 C)  98.5 F (36.9 C) 98.4 F (36.9 C)  TempSrc: Oral  Oral Oral  SpO2: 99% 99% 97% 98%  Weight:      Height:        General: Pt is alert, awake, not in acute distress Cardiovascular: RRR, S1/S2 +, no rubs, no gallops Respiratory: CTA bilaterally, no wheezing, no rhonchi Abdominal: Soft, NT, ND, bowel sounds + Extremities: no edema, no cyanosis    The results of significant diagnostics from this hospitalization (including imaging, microbiology, ancillary and laboratory) are listed below for reference.     Microbiology: No results found for this or any previous visit (from the past 240 hour(s)).   Labs: BNP (last  3 results) Recent Labs    12/15/17 0023 02/22/18 2321  BNP 133.0* 167.0*   Basic Metabolic Panel: Recent Labs  Lab 02/22/18 2321 02/24/18 0835 02/28/18 0502  NA 141 141 140  K 4.0 4.2 4.6  CL 106 109 107  CO2 27 25 29   GLUCOSE 83 89 86  BUN 17 12 20   CREATININE 0.75 0.61 0.70  CALCIUM 8.9 8.5* 8.7*   Liver Function Tests: No results for input(s): AST, ALT, ALKPHOS, BILITOT, PROT, ALBUMIN in the last 168 hours. No results for input(s): LIPASE, AMYLASE in the last 168 hours. No results for input(s): AMMONIA in the last 168 hours. CBC: Recent Labs  Lab 02/22/18 2321 02/28/18 0502  WBC 6.8 5.7  HGB 14.4 14.0  HCT 44.1 44.1  MCV 88.9 89.6  PLT 208 233   Cardiac Enzymes: Recent Labs  Lab 02/22/18 2321 02/23/18 0613 02/23/18 1233  TROPONINI <0.03 <0.03 <0.03   BNP: Invalid input(s): POCBNP CBG: No results for input(s): GLUCAP in the last 168 hours. D-Dimer No results for  input(s): DDIMER in the last 72 hours. Hgb A1c No results for input(s): HGBA1C in the last 72 hours. Lipid Profile No results for input(s): CHOL, HDL, LDLCALC, TRIG, CHOLHDL, LDLDIRECT in the last 72 hours. Thyroid function studies No results for input(s): TSH, T4TOTAL, T3FREE, THYROIDAB in the last 72 hours.  Invalid input(s): FREET3 Anemia work up No results for input(s): VITAMINB12, FOLATE, FERRITIN, TIBC, IRON, RETICCTPCT in the last 72 hours. Urinalysis    Component Value Date/Time   COLORURINE YELLOW 12/20/2014 2054   APPEARANCEUR CLEAR 12/20/2014 2054   LABSPEC 1.010 12/20/2014 2054   PHURINE 5.0 12/20/2014 2054   GLUCOSEU NEGATIVE 12/20/2014 2054   HGBUR TRACE (A) 12/20/2014 2054   BILIRUBINUR NEGATIVE 12/20/2014 2054   KETONESUR NEGATIVE 12/20/2014 2054   PROTEINUR NEGATIVE 12/20/2014 2054   UROBILINOGEN 0.2 12/20/2014 2054   NITRITE NEGATIVE 12/20/2014 2054   LEUKOCYTESUR TRACE (A) 12/20/2014 2054   Sepsis Labs Invalid input(s): PROCALCITONIN,  WBC,  LACTICIDVEN Microbiology No results found for this or any previous visit (from the past 240 hour(s)).   Time coordinating discharge:  SIGNED:   Erick Blinks, MD  [facility] 02/28/2018, 1:23 PM Pager   If 7PM-7AM, please contact night-coverage [email or web address] Password [email or web address]

## 2018-02-28 NOTE — Progress Notes (Signed)
ANTICOAGULATION CONSULT NOTE  Pharmacy Consult for Coumadin Indication: pulmonary embolus  Allergies  Allergen Reactions  . Contrast Media [Iodinated Diagnostic Agents] Anaphylaxis and Shortness Of Breath  . Vancomycin Shortness Of Breath    REACTION: SOB  . Lasix [Furosemide]   . Vioxx [Rofecoxib]     REACTION: irregular heartbeat    Patient Measurements: Height: 5\' 4"  (162.6 cm) Weight: 159 lb 2.8 oz (72.2 kg) IBW/kg (Calculated) : 54.7  Vital Signs: Temp: 98.4 F (36.9 C) (09/09 0636) Temp Source: Oral (09/09 0636) BP: 127/61 (09/09 0636) Pulse Rate: 70 (09/09 0636)  Labs: Recent Labs    02/26/18 0608 02/27/18 5597 02/28/18 0502  HGB  --   --  14.0  HCT  --   --  44.1  PLT  --   --  233  LABPROT 22.7* 34.6* 40.9*  INR 2.02 3.46 4.29*  CREATININE  --   --  0.70    Estimated Creatinine Clearance: 53.7 mL/min (by C-G formula based on SCr of 0.7 mg/dL).   Medical History: Past Medical History:  Diagnosis Date  . CHF (congestive heart failure) (HCC)   . History of pulmonary embolism   . Parkinson's disease (HCC)   . Venous stasis     Medications:  Medications Prior to Admission  Medication Sig Dispense Refill Last Dose  . warfarin (COUMADIN) 2.5 MG tablet Take 1 tablet (2.5 mg total) by mouth daily at 6 PM. 2.5mg  on M T [facility] SUN and 4mg  on FRI or Take as directed by anticoagulation clinic. (Patient taking differently: Take 2.5-3.75 mg by mouth See admin instructions. 2.5mg  on all days, except take 3.75mg  on Fridays only) 30 tablet 0     Assessment: 83 yo female who presented to ED with chest pain as well as tingling and numbness in her right hand. She take chronic coumadin for previous PE. INR 10.62 on 9/5. Vitamin K 10 mg IV given x 1 dose   Home dose: 2.5mg  on all days, except take 3.75mg  on Fridays only  Goal of Therapy:  INR 2-3 Monitor platelets by anticoagulation protocol: Yes  INR 4.29  Supratherapeutic  Plan:  Hold warfarin tonight PT-  INR daily- f/u with coumadin clinic as outpatient Monitor for s/s of bleeding  Judeth Cornfield, PharmD Clinical Pharmacist 02/28/2018 10:26 AM

## 2018-02-28 NOTE — Care Management Important Message (Signed)
Important Message  Patient Details  Name: [name] MRN: [id number] Date of Birth: Nov 15, 1934   Medicare Important Message Given:  Yes    [name] 02/28/2018, 1:20 PM

## 2018-03-19 ENCOUNTER — Inpatient Hospital Stay (HOSPITAL_COMMUNITY)
Admission: EM | Admit: 2018-03-19 | Discharge: 2018-03-22 | DRG: 197 | Disposition: A | Discharge status: Home Health | Payer: Medicare Other | Attending: Internal Medicine | Admitting: Internal Medicine

## 2018-03-19 ENCOUNTER — Emergency Department (HOSPITAL_COMMUNITY): Payer: Medicare Other

## 2018-03-19 ENCOUNTER — Other Ambulatory Visit: Payer: Self-pay

## 2018-03-19 ENCOUNTER — Encounter (HOSPITAL_COMMUNITY): Payer: Self-pay | Admitting: Emergency Medicine

## 2018-03-19 DIAGNOSIS — I4510 Unspecified right bundle-branch block: Secondary | ICD-10-CM | POA: Diagnosis present

## 2018-03-19 DIAGNOSIS — Z91041 Radiographic dye allergy status: Secondary | ICD-10-CM

## 2018-03-19 DIAGNOSIS — R791 Abnormal coagulation profile: Secondary | ICD-10-CM | POA: Diagnosis not present

## 2018-03-19 DIAGNOSIS — Z7901 Long term (current) use of anticoagulants: Secondary | ICD-10-CM

## 2018-03-19 DIAGNOSIS — R05 Cough: Secondary | ICD-10-CM | POA: Diagnosis not present

## 2018-03-19 DIAGNOSIS — G20 Parkinson's disease: Secondary | ICD-10-CM | POA: Diagnosis present

## 2018-03-19 DIAGNOSIS — D689 Coagulation defect, unspecified: Secondary | ICD-10-CM | POA: Diagnosis present

## 2018-03-19 DIAGNOSIS — Z8249 Family history of ischemic heart disease and other diseases of the circulatory system: Secondary | ICD-10-CM

## 2018-03-19 DIAGNOSIS — Z888 Allergy status to other drugs, medicaments and biological substances status: Secondary | ICD-10-CM

## 2018-03-19 DIAGNOSIS — R059 Cough, unspecified: Secondary | ICD-10-CM

## 2018-03-19 DIAGNOSIS — J849 Interstitial pulmonary disease, unspecified: Secondary | ICD-10-CM | POA: Diagnosis not present

## 2018-03-19 DIAGNOSIS — Z7189 Other specified counseling: Secondary | ICD-10-CM

## 2018-03-19 DIAGNOSIS — I5032 Chronic diastolic (congestive) heart failure: Secondary | ICD-10-CM | POA: Diagnosis present

## 2018-03-19 DIAGNOSIS — Z515 Encounter for palliative care: Secondary | ICD-10-CM

## 2018-03-19 DIAGNOSIS — Z86711 Personal history of pulmonary embolism: Secondary | ICD-10-CM | POA: Diagnosis present

## 2018-03-19 DIAGNOSIS — Z881 Allergy status to other antibiotic agents status: Secondary | ICD-10-CM

## 2018-03-19 DIAGNOSIS — M40294 Other kyphosis, thoracic region: Secondary | ICD-10-CM | POA: Diagnosis present

## 2018-03-19 DIAGNOSIS — I872 Venous insufficiency (chronic) (peripheral): Secondary | ICD-10-CM | POA: Diagnosis present

## 2018-03-19 DIAGNOSIS — R1310 Dysphagia, unspecified: Secondary | ICD-10-CM | POA: Diagnosis present

## 2018-03-19 DIAGNOSIS — Z79899 Other long term (current) drug therapy: Secondary | ICD-10-CM

## 2018-03-19 DIAGNOSIS — R079 Chest pain, unspecified: Secondary | ICD-10-CM

## 2018-03-19 DIAGNOSIS — B9710 Unspecified enterovirus as the cause of diseases classified elsewhere: Secondary | ICD-10-CM | POA: Diagnosis present

## 2018-03-19 DIAGNOSIS — M7989 Other specified soft tissue disorders: Secondary | ICD-10-CM

## 2018-03-19 DIAGNOSIS — R072 Precordial pain: Secondary | ICD-10-CM | POA: Diagnosis not present

## 2018-03-19 DIAGNOSIS — T501X6A Underdosing of loop [high-ceiling] diuretics, initial encounter: Secondary | ICD-10-CM | POA: Diagnosis present

## 2018-03-19 DIAGNOSIS — I2699 Other pulmonary embolism without acute cor pulmonale: Secondary | ICD-10-CM

## 2018-03-19 DIAGNOSIS — J209 Acute bronchitis, unspecified: Secondary | ICD-10-CM | POA: Diagnosis present

## 2018-03-19 DIAGNOSIS — R319 Hematuria, unspecified: Secondary | ICD-10-CM

## 2018-03-19 LAB — URINALYSIS, ROUTINE W REFLEX MICROSCOPIC
BILIRUBIN URINE: NEGATIVE
Bacteria, UA: NONE SEEN
Glucose, UA: NEGATIVE mg/dL
Ketones, ur: NEGATIVE mg/dL
LEUKOCYTES UA: NEGATIVE
NITRITE: NEGATIVE
Protein, ur: NEGATIVE mg/dL
RBC / HPF: >50 RBC/hpf — ABNORMAL HIGH (ref 0–5)
SPECIFIC GRAVITY, URINE: 1.008 (ref 1.005–1.030)
pH: 8 (ref 5.0–8.0)

## 2018-03-19 LAB — CBC WITH DIFFERENTIAL/PLATELET
Basophils Absolute: 0 10*3/uL (ref 0.0–0.1)
Basophils Relative: 0 %
Eosinophils Absolute: 0.2 10*3/uL (ref 0.0–0.7)
Eosinophils Relative: 4 %
HCT: 42.3 % (ref 36.0–46.0)
Hemoglobin: 13.5 g/dL (ref 12.0–15.0)
LYMPHS ABS: 1.1 10*3/uL (ref 0.7–4.0)
LYMPHS PCT: 20 %
MCH: 28.2 pg (ref 26.0–34.0)
MCHC: 31.9 g/dL (ref 30.0–36.0)
MCV: 88.5 fL (ref 78.0–100.0)
MONOS PCT: 10 %
Monocytes Absolute: 0.6 10*3/uL (ref 0.1–1.0)
Neutro Abs: 3.9 10*3/uL (ref 1.7–7.7)
Neutrophils Relative %: 66 %
PLATELETS: 181 10*3/uL (ref 150–400)
RBC: 4.78 MIL/uL (ref 3.87–5.11)
RDW: 13.9 % (ref 11.5–15.5)
WBC: 5.8 10*3/uL (ref 4.0–10.5)

## 2018-03-19 LAB — TROPONIN I: Troponin I: <0.03 ng/mL (ref <0.03)

## 2018-03-19 LAB — COMPREHENSIVE METABOLIC PANEL WITH GFR
ALT: 18 U/L (ref 0–44)
AST: 26 U/L (ref 15–41)
Albumin: 3.7 g/dL (ref 3.5–5.0)
Alkaline Phosphatase: 67 U/L (ref 38–126)
Anion gap: 4 — ABNORMAL LOW (ref 5–15)
BUN: 14 mg/dL (ref 8–23)
CO2: 27 mmol/L (ref 22–32)
Calcium: 8.6 mg/dL — ABNORMAL LOW (ref 8.9–10.3)
Chloride: 108 mmol/L (ref 98–111)
Creatinine, Ser: 0.61 mg/dL (ref 0.44–1.00)
GFR calc Af Amer: >60 mL/min
GFR calc non Af Amer: >60 mL/min
Glucose, Bld: 94 mg/dL (ref 70–99)
Potassium: 3.9 mmol/L (ref 3.5–5.1)
Sodium: 139 mmol/L (ref 135–145)
Total Bilirubin: 0.5 mg/dL (ref 0.3–1.2)
Total Protein: 6.9 g/dL (ref 6.5–8.1)

## 2018-03-19 LAB — PROTIME-INR

## 2018-03-19 LAB — BRAIN NATRIURETIC PEPTIDE: B Natriuretic Peptide: 66 pg/mL (ref 0.0–100.0)

## 2018-03-19 MED ORDER — ACETAMINOPHEN 325 MG PO TABS
650.0000 mg | ORAL_TABLET | Freq: Four times a day (QID) | ORAL | Status: DC | PRN
  Administered 2018-03-20 – 2018-03-21 (×2): 650 mg via ORAL
  Filled 2018-03-19 (×2): qty 2

## 2018-03-19 MED ORDER — ONDANSETRON HCL 4 MG PO TABS: 4.0000 mg | ORAL_TABLET | Freq: Four times a day (QID) | ORAL | Status: DC | PRN

## 2018-03-19 MED ORDER — PHYTONADIONE 5 MG PO TABS
5.0000 mg | ORAL_TABLET | Freq: Once | ORAL | Status: AC
  Administered 2018-03-19: 5 mg via ORAL
  Filled 2018-03-19 (×2): qty 1

## 2018-03-19 MED ORDER — MAGIC MOUTHWASH
10.0000 mL | Freq: Four times a day (QID) | ORAL | Status: DC
  Administered 2018-03-19 – 2018-03-21 (×3): 10 mL via ORAL
  Filled 2018-03-19 (×5): qty 10

## 2018-03-19 MED ORDER — FUROSEMIDE 10 MG/ML IJ SOLN
20.0000 mg | Freq: Once | INTRAMUSCULAR | Status: AC
  Administered 2018-03-19: 20 mg via INTRAVENOUS
  Filled 2018-03-19: qty 2

## 2018-03-19 MED ORDER — ONDANSETRON HCL 4 MG/2ML IJ SOLN: 4.0000 mg | Freq: Four times a day (QID) | INTRAMUSCULAR | Status: DC | PRN

## 2018-03-19 MED ORDER — MAGIC MOUTHWASH: 10.0000 mL | Freq: Three times a day (TID) | ORAL | Status: DC

## 2018-03-19 MED ORDER — ACETAMINOPHEN 650 MG RE SUPP: 650.0000 mg | Freq: Four times a day (QID) | RECTAL | Status: DC | PRN

## 2018-03-19 NOTE — H&P (Signed)
History and Physical  [name] RUE:[id number] DOB: [birth year] DOA: 03/19/2018   PCP: [name], MD   Patient coming from: Home  Chief Complaint: dyspnea  HPI:  [name] is a 83 y.o. female with medical history of diastolic CHF, PE, Parkinson's disease, venous stasis dermatitis, and dysphagia presenting with numerous complaints today, but primarily her chief complaint is that of dyspnea and left-sided chest pain.  Today represents the patient's fourth admission in the past 9 months.  The patient states that she has had some intermittent dyspnea for the past 1 to 2 days.  She has had a nonproductive cough.  She has had some subjective fevers and chills.  She denies any nausea, vomiting, diarrhea, abdominal pain, dysuria, hematuria.  She states that her lower extremity edema has been about the same as usual.  She states that her left-sided chest pain is under her left breast and has been intermittent and sharp in nature.  It is intermittently positional.  In addition, the patient felt like there was a white sensation about her right leg this morning.  She was concerned there may have been bleeding.  She denies any headache or worsening neck pain.  Notably, the patient was recently discharged from the hospital after a stay from 02/22/2018 through 02/28/2018.  At that time, the patient was treated for coagulopathy with INR greater than 10.  In addition, she was found to have acute on chronic diastolic CHF.  She was discharged home with furosemide 20 mg daily.  However the patient states that she has not been taking her furosemide.  In the emergency department, the patient was afebrile hemodynamically stable saturating 96 to 98% on room air.  BMP, LFTs, and CBC were essentially unremarkable.  Urinalysis was negative for pyuria.  BNP was 66.0.  Chest x-ray showed chronic interstitial markings.  INR was greater than 10.  EKG shows sinus rhythm with right bundle branch  block.  Assessment/Plan: Coagulopathy -It is unclear whether the patient is taking her warfarin properly -As discussed, the patient has not been taking her furosemide -Vitamin K 5 mg p.o. X 1 -repeat INR in am -Has been discussed with the patient in the past 2 days switch to a NOAC to which she refused in past -hold warfarin  Dyspnea -Reviewed the medical record shows that the patient has had a chronic intermittent dyspnea -This is likely multifactorial including restrictive lung disease from the patient's body habitus, diastolic dysfunction, and possibly aspiration pneumonitis -also suspect a degree of underlying interstitial lung disease -BNP 66.0 -check procalcitonin -lasix IV x 1 and reassess -viral respiratory panel  Chronic diastolic CHF -Difficult to clarify the patient's fluid status given her body habitus -Lasix IV x1 and reassess -Although Lasix allergy is listed, the patient has received furosemide and her previous hospital admission  History of pulmonary embolus -Therapy multiple discussions with the patient in the past from discontinuation of warfarin altogether versus changing to NOAC -She is resistant to any type of change -This occurred nearly 10 years ago -Per patient history--patient has never had any previous PE nor any subsequent PE -check factor V Leiden, prothrombin gene mutation, lupus anticoagulant  Parkinson's Disease -no presently on any meds  Chest Pain -not a candidate for aggressive therapy -cycle troponin - The patient is a poor historian but overall reports having worsening shortness of breath at rest and with activity for the past few weeks with episodes of chest discomfort at rest  Social -  Patient appears to have poor social circumstances at home -She lives alone with caregivers providing assistance approximately 8 hours daily -The patient normally has numerous complaints and requests, but frequently refuses appropriate medical care or any  changes to the status quo resulting in substandard medical care      Past Medical History:  Diagnosis Date  . CHF (congestive heart failure) (HCC)   . History of pulmonary embolism   . Parkinson's disease (HCC)   . Venous stasis    History reviewed. No pertinent surgical history. Social History:  reports that she has never smoked. She has never used smokeless tobacco. She reports that she does not drink alcohol or use drugs.   Family History  Problem Relation Age of Onset  . Heart disease Mother        90s     Allergies  Allergen Reactions  . Contrast Media [Iodinated Diagnostic Agents] Anaphylaxis and Shortness Of Breath  . Vancomycin Shortness Of Breath    REACTION: SOB  . Vioxx [Rofecoxib]     REACTION: irregular heartbeat     Prior to Admission medications   Medication Sig Start Date End Date Taking? Authorizing Provider  warfarin (COUMADIN) 2 MG tablet Take 1 tablet (2 mg total) by mouth daily at 6 PM. Take one tab daily or Take as directed by anticoagulation clinic. Start after 9/11 if INR<3 02/28/18  Yes Memon, Durward Mallard, MD  diltiazem (CARDIZEM CD) 120 MG 24 hr capsule Take 1 capsule (120 mg total) by mouth daily. Patient not taking: Reported on 03/19/2018 03/01/18   Erick Blinks, MD  furosemide (LASIX) 20 MG tablet Take 1 tablet (20 mg total) by mouth daily. Patient not taking: Reported on 03/19/2018 03/01/18   Erick Blinks, MD  gabapentin (NEURONTIN) 100 MG capsule Take 1 capsule (100 mg total) by mouth 3 (three) times daily. Patient not taking: Reported on 03/19/2018 02/28/18   Erick Blinks, MD    Review of Systems:  Constitutional:  No weight loss, night sweats,  Head&Eyes: No headache.  No vision loss.  No eye pain or scotoma ENT:  No Difficulty swallowing,Tooth/dental problems,Sore throat,  No ear ache, post nasal drip,  Cardio-vascular:  No chest pain, Orthopnea, PND, swelling in lower extremities,  dizziness, palpitations  GI:  No  abdominal  pain, nausea, vomiting, diarrhea, loss of appetite, hematochezia, melena, heartburn, indigestion, Resp:   No coughing up of blood .No wheezing.No chest wall deformity  Skin:  no rash or lesions.  GU:  no dysuria, change in color of urine, no urgency or frequency. No flank pain.  Musculoskeletal:   No decreased range of motion. No back pain.  Psych:  No change in mood or affect. No depression or anxiety. Neurologic: No headache, no dysesthesia, no focal weakness, no vision loss. No syncope  Physical Exam: Vitals:   03/19/18 1107 03/19/18 1111 03/19/18 1230 03/19/18 1245  BP: (!) 154/81     Pulse: 80  81 84  Resp: 16  14 19   Temp: 98 F (36.7 C)     TempSrc: Oral     SpO2: 96%  99% 98%  Weight:  68 kg    Height:  5\' 4"  (1.626 m)     General:  A&O x 3, NAD, nontoxic, pleasant/cooperative Head/Eye: No conjunctival hemorrhage, no icterus, [place]/AT, No nystagmus ENT:  No icterus,  No thrush, good dentition, no pharyngeal exudate Neck:  No masses, no lymphadenpathy, no bruits CV:  RRR, no rub, no gallop, no S3 Lung: Diminished breath  sounds at the bases.  No wheezing.  Good air movement Abdomen: soft/NT, +BS, nondistended, no peritoneal signs Ext: No cyanosis, No rashes, No petechiae, No lymphangitis, 1 + LE edema Neuro: CNII-XII intact, strength 4/5 in bilateral upper and lower extremities, no dysmetria  Labs on Admission:  Basic Metabolic Panel: Recent Labs  Lab 03/19/18 1211  NA 139  K 3.9  CL 108  CO2 27  GLUCOSE 94  BUN 14  CREATININE 0.61  CALCIUM 8.6*   Liver Function Tests: Recent Labs  Lab 03/19/18 1211  AST 26  ALT 18  ALKPHOS 67  BILITOT 0.5  PROT 6.9  ALBUMIN 3.7   No results for input(s): LIPASE, AMYLASE in the last 168 hours. No results for input(s): AMMONIA in the last 168 hours. CBC: Recent Labs  Lab 03/19/18 1211  WBC 5.8  NEUTROABS 3.9  HGB 13.5  HCT 42.3  MCV 88.5  PLT 181   Coagulation Profile: Recent Labs  Lab 03/19/18 1211   INR >10.00*   Cardiac Enzymes: Recent Labs  Lab 03/19/18 1211  TROPONINI <0.03   BNP: Invalid input(s): POCBNP CBG: No results for input(s): GLUCAP in the last 168 hours. Urine analysis:    Component Value Date/Time   COLORURINE STRAW (A) 03/19/2018 1221   APPEARANCEUR CLEAR 03/19/2018 1221   LABSPEC 1.008 03/19/2018 1221   PHURINE 8.0 03/19/2018 1221   GLUCOSEU NEGATIVE 03/19/2018 1221   HGBUR LARGE (A) 03/19/2018 1221   BILIRUBINUR NEGATIVE 03/19/2018 1221   KETONESUR NEGATIVE 03/19/2018 1221   PROTEINUR NEGATIVE 03/19/2018 1221   UROBILINOGEN 0.2 12/20/2014 2054   NITRITE NEGATIVE 03/19/2018 1221   LEUKOCYTESUR NEGATIVE 03/19/2018 1221   Sepsis Labs: @LABRCNTIP (procalcitonin:4,lacticidven:4) )No results found for this or any previous visit (from the past 240 hour(s)).   Radiological Exams on Admission: Dg Chest Port 1 View  Result Date: 03/19/2018 CLINICAL DATA:  Cough and dysphagia EXAM: PORTABLE CHEST 1 VIEW COMPARISON:  February 25, 2018 FINDINGS: Patient's mandible obscures much of the right upper lobe and a portion of the medial left upper lobe. Visualized lung regions appear clear. There is mild cardiomegaly with pulmonary vascularity normal. No adenopathy. No bone lesions. IMPRESSION: Visualized lungs clear. Note that portions of the upper lobes, particular on the right, obscured by patient's mandible. Stable cardiac prominence. Electronically Signed   By: Bretta Bang III M.D.   On: 03/19/2018 13:42    EKG: Independently reviewed. Sinus, RBBB    Time spent:60 minutes Code Status:   FULL Family Communication:  No Family at bedside Disposition Plan: expect 1-2 day hospitalization Consults called: none DVT Prophylaxis: coumadin  Catarina Hartshorn, DO  [facility] Pager [phone]  If 7PM-7AM, please contact night-coverage [email or web address] Password [facility] 03/19/2018, 3:14 PM

## 2018-03-19 NOTE — ED Notes (Addendum)
Pt informed me that her sitter did not come today.  Pt asked if I knew of anyone that could sit with her from 8 am - 12 noon and 4 pm - 8 pm.  Pt is very needy and constantly calling out.

## 2018-03-19 NOTE — ED Triage Notes (Signed)
Having difficulty swallowing this am.  Increasing redness to lower extremities.  Having some numbness to lower for about one week.

## 2018-03-19 NOTE — ED Notes (Signed)
Pt is very demanding and impatient.

## 2018-03-19 NOTE — ED Notes (Signed)
Pt keeps calling out with various requests.  Multiple staff members have been in room multiple times approximately every 5 minutes.  This time pt wanted her feet lifted.  Repositioned them as requested although someone had already bridged her heels.  Wanted something to eat.  Advised pt we did not have any lunch trays at this time and offered crackers which pt declined.  Pt then wanted to know my name.

## 2018-03-19 NOTE — ED Notes (Signed)
Date and time results received: 03/19/18 1:50 PM  (use smartphrase ".now" to insert current time)  Test: INR Critical Value: >10  Name of Provider Notified: Ranae Palms  Orders Received? Or Actions Taken?: Orders Received - See Orders for details

## 2018-03-19 NOTE — ED Provider Notes (Signed)
[facility] EMERGENCY DEPARTMENT Provider Note   CSN: [id number] Arrival date & time: 03/19/18  1110     History   Chief Complaint Chief Complaint  Patient presents with  . Dysphagia    HPI [name] is a 83 y.o. female.  HPI Patient presents with several complaints.  States that she had subjective fever and chills this morning.  She also had some difficulty swallowing and had a choking and coughing episode.  States she also had some palpitations but this is since resolved.  Denies any abdominal pain.  Has lower extremity swelling and numbness which she states is constant.  She believes she may have some increased redness in the lower extremities. Past Medical History:  Diagnosis Date  . CHF (congestive heart failure) (HCC)   . History of pulmonary embolism   . Parkinson's disease (HCC)   . Venous stasis     Patient Active Problem List   Diagnosis Date Noted  . Supratherapeutic INR 02/24/2018  . Physical debility 02/24/2018  . Folliculitis of perineum 12/15/2017  . Streptococcal bacteremia 07/09/2017  . Cellulitis of lower extremity   . Bacteremia due to Gram-positive bacteria 07/06/2017  . Cellulitis and abscess of left leg 07/05/2017  . Vulvar abscess 07/05/2017  . Infected cyst of Bartholin's gland duct 07/05/2017  . Weakness 12/21/2014  . Leg weakness 12/20/2014  . Lower extremity weakness 12/20/2014  . Precordial pain 12/20/2014  . Diarrhea   . Pressure ulcer, heel 11/28/2014  . AKI (acute kidney injury) (HCC) 11/28/2014  . Chronic diastolic congestive heart failure (HCC) 11/28/2014  . Parkinson's disease (HCC) 11/28/2014  . Pain in the chest   . Encounter for therapeutic drug monitoring 09/15/2013  . Long term (current) use of anticoagulants 09/22/2010  . Pulmonary embolism (HCC) 08/25/2010  . Generalized weakness 08/16/2009  . LEG EDEMA 08/16/2009  . Chronic diastolic CHF (congestive heart failure) (HCC) 08/16/2009  . Venous (peripheral)  insufficiency 11/15/2008  . PULMONARY EMBOLISM, HX OF 11/15/2008    History reviewed. No pertinent surgical history.   OB History   None      Home Medications    Prior to Admission medications   Medication Sig Start Date End Date Taking? Authorizing Provider  warfarin (COUMADIN) 2 MG tablet Take 1 tablet (2 mg total) by mouth daily at 6 PM. Take one tab daily or Take as directed by anticoagulation clinic. Start after 9/11 if INR<3 02/28/18  Yes Memon, Durward Mallard, MD  diltiazem (CARDIZEM CD) 120 MG 24 hr capsule Take 1 capsule (120 mg total) by mouth daily. Patient not taking: Reported on 03/19/2018 03/01/18   Erick Blinks, MD  furosemide (LASIX) 20 MG tablet Take 1 tablet (20 mg total) by mouth daily. Patient not taking: Reported on 03/19/2018 03/01/18   Erick Blinks, MD  gabapentin (NEURONTIN) 100 MG capsule Take 1 capsule (100 mg total) by mouth 3 (three) times daily. Patient not taking: Reported on 03/19/2018 02/28/18   Erick Blinks, MD    Family History Family History  Problem Relation Age of Onset  . Heart disease Mother        20s    Social History Social History   Tobacco Use  . Smoking status: Never Smoker  . Smokeless tobacco: Never Used  Substance Use Topics  . Alcohol use: No  . Drug use: No     Allergies   Contrast media [iodinated diagnostic agents]; Vancomycin; and Vioxx [rofecoxib]   Review of Systems Review of Systems  Constitutional: Positive for  chills and fever.  HENT: Positive for trouble swallowing. Negative for sore throat.   Respiratory: Positive for cough and shortness of breath.   Cardiovascular: Positive for palpitations and leg swelling. Negative for chest pain.  Gastrointestinal: Negative for abdominal pain, constipation, diarrhea, nausea and vomiting.  Genitourinary: Negative for dysuria, flank pain and frequency.  Musculoskeletal: Negative for back pain, myalgias and neck pain.  Skin: Positive for color change. Negative for wound.    Neurological: Positive for tremors. Negative for dizziness, weakness, light-headedness, numbness and headaches.  All other systems reviewed and are negative.    Physical Exam Updated Vital Signs BP (!) 154/81 (BP Location: Left Wrist)   Pulse 84   Temp 98 F (36.7 C) (Oral)   Resp 19   Ht 5\' 4"  (1.626 m)   Wt 68 kg   SpO2 98%   BMI 25.75 kg/m   Physical Exam  Constitutional: She is oriented to person, place, and time. She appears well-developed and well-nourished.  HENT:  Head: Normocephalic and atraumatic.  Mouth/Throat: Oropharynx is clear and moist. No oropharyngeal exudate.  Eyes: Pupils are equal, round, and reactive to light. EOM are normal.  Neck: Normal range of motion. Neck supple.  Severe kyphosis  Cardiovascular: Normal rate and regular rhythm. Exam reveals no gallop and no friction rub.  No murmur heard. Pulmonary/Chest: Effort normal. She has rales.  Scattered rails and rhonchi.  No respiratory distress.  Abdominal: Soft. Bowel sounds are normal. There is no tenderness. There is no rebound and no guarding.  Musculoskeletal: Normal range of motion. She exhibits edema. She exhibits no tenderness.  Bilateral lower extremity hyperpigmentation likely related to chronic venous insufficiency.  Patient does appear to have some increased redness to the left lower extremity and mild warmth.  Distal pulses are 2+ bilaterally.  Lymphadenopathy:    She has no cervical adenopathy.  Neurological: She is alert and oriented to person, place, and time.  Pill-rolling tremor bilateral upper extremities.  Rigidity throughout.  Moving all extremities without focal deficit.  Diminished sensation to light touch bilateral lower extremities.  Skin: Skin is warm and dry. Capillary refill takes less than 2 seconds. No rash noted. There is erythema.  Psychiatric: She has a normal mood and affect. Her behavior is normal.  Nursing note and vitals reviewed.    ED Treatments / Results   Labs (all labs ordered are listed, but only abnormal results are displayed) Labs Reviewed  COMPREHENSIVE METABOLIC PANEL - Abnormal; Notable for the following components:      Result Value   Calcium 8.6 (*)    Anion gap 4 (*)    All other components within normal limits  URINALYSIS, ROUTINE W REFLEX MICROSCOPIC - Abnormal; Notable for the following components:   Color, Urine STRAW (*)    Hgb urine dipstick LARGE (*)    RBC / HPF >50 (*)    All other components within normal limits  PROTIME-INR - Abnormal; Notable for the following components:   Prothrombin Time >90.0 (*)    INR >10.00 (*)    All other components within normal limits  CBC WITH DIFFERENTIAL/PLATELET  BRAIN NATRIURETIC PEPTIDE  TROPONIN I    EKG EKG Interpretation  Date/Time:  Saturday March 19 2018 12:12:57 EDT Ventricular Rate:  88 PR Interval:    QRS Duration: 131 QT Interval:  421 QTC Calculation: 510 R Axis:   -66 Text Interpretation:  Multiform ventriculre complexes Right atrial enlargement RBBB and LAFB Left ventricular hypertrophy Artifact in lead(s) I  II III aVR aVL Confirmed by Loren Racer ([id number]) on 03/19/2018 2:41:06 PM   Radiology Dg Chest Port 1 View  Result Date: 03/19/2018 CLINICAL DATA:  Cough and dysphagia EXAM: PORTABLE CHEST 1 VIEW COMPARISON:  February 25, 2018 FINDINGS: Patient's mandible obscures much of the right upper lobe and a portion of the medial left upper lobe. Visualized lung regions appear clear. There is mild cardiomegaly with pulmonary vascularity normal. No adenopathy. No bone lesions. IMPRESSION: Visualized lungs clear. Note that portions of the upper lobes, particular on the right, obscured by patient's mandible. Stable cardiac prominence. Electronically Signed   By: Bretta Bang III M.D.   On: 03/19/2018 13:42    Procedures Procedures (including critical care time)  Medications Ordered in ED Medications - No data to display   Initial Impression /  Assessment and Plan / ED Course  I have reviewed the triage vital signs and the nursing notes.  Pertinent labs & imaging results that were available during my care of the patient were reviewed by me and considered in my medical decision making (see chart for details).     Questionable left lower leg cellulitis.  Normal white blood cell count.  Afebrile.  Patient does have elevated INR greater than 10.  She has hematuria but no evidence of infection.  States she has been taking her Coumadin as prescribed.  Discussed with hospitalist will see patient in the emergency department.  Final Clinical Impressions(s) / ED Diagnoses   Final diagnoses:  Elevated INR  Hematuria, unspecified type  Localized swelling of both lower extremities    ED Discharge Orders    None       Loren Racer, MD 03/19/18 1441

## 2018-03-20 DIAGNOSIS — D689 Coagulation defect, unspecified: Secondary | ICD-10-CM | POA: Diagnosis not present

## 2018-03-20 DIAGNOSIS — R791 Abnormal coagulation profile: Secondary | ICD-10-CM | POA: Diagnosis not present

## 2018-03-20 DIAGNOSIS — I5032 Chronic diastolic (congestive) heart failure: Secondary | ICD-10-CM | POA: Diagnosis not present

## 2018-03-20 DIAGNOSIS — Z7901 Long term (current) use of anticoagulants: Secondary | ICD-10-CM | POA: Diagnosis not present

## 2018-03-20 LAB — RESPIRATORY PANEL BY PCR
Adenovirus: NOT DETECTED
BORDETELLA PERTUSSIS-RVPCR: NOT DETECTED
CHLAMYDOPHILA PNEUMONIAE-RVPPCR: NOT DETECTED
Coronavirus 229E: NOT DETECTED
Coronavirus HKU1: NOT DETECTED
Coronavirus NL63: NOT DETECTED
Coronavirus OC43: NOT DETECTED
INFLUENZA A-RVPPCR: NOT DETECTED
Influenza B: NOT DETECTED
MYCOPLASMA PNEUMONIAE-RVPPCR: NOT DETECTED
Metapneumovirus: NOT DETECTED
PARAINFLUENZA VIRUS 3-RVPPCR: NOT DETECTED
PARAINFLUENZA VIRUS 4-RVPPCR: NOT DETECTED
Parainfluenza Virus 1: NOT DETECTED
Parainfluenza Virus 2: NOT DETECTED
RHINOVIRUS / ENTEROVIRUS - RVPPCR: DETECTED — AB
Respiratory Syncytial Virus: NOT DETECTED

## 2018-03-20 LAB — CBC
HEMATOCRIT: 42.5 % (ref 36.0–46.0)
Hemoglobin: 13.8 g/dL (ref 12.0–15.0)
MCH: 28.6 pg (ref 26.0–34.0)
MCHC: 32.5 g/dL (ref 30.0–36.0)
MCV: 88 fL (ref 78.0–100.0)
PLATELETS: 182 10*3/uL (ref 150–400)
RBC: 4.83 MIL/uL (ref 3.87–5.11)
RDW: 13.8 % (ref 11.5–15.5)
WBC: 5.6 10*3/uL (ref 4.0–10.5)

## 2018-03-20 LAB — ABO/RH: ABO/RH(D): A POS

## 2018-03-20 LAB — PROTIME-INR: Prothrombin Time: 85.2 seconds — ABNORMAL HIGH (ref 11.4–15.2)

## 2018-03-20 LAB — BASIC METABOLIC PANEL
Anion gap: 8 (ref 5–15)
BUN: 17 mg/dL (ref 8–23)
CO2: 28 mmol/L (ref 22–32)
CREATININE: 0.81 mg/dL (ref 0.44–1.00)
Calcium: 8.7 mg/dL — ABNORMAL LOW (ref 8.9–10.3)
Chloride: 103 mmol/L (ref 98–111)
GFR calc Af Amer: >60 mL/min (ref >60)
GLUCOSE: 100 mg/dL — AB (ref 70–99)
POTASSIUM: 3.6 mmol/L (ref 3.5–5.1)
SODIUM: 139 mmol/L (ref 135–145)

## 2018-03-20 MED ORDER — PHYTONADIONE 5 MG PO TABS
10.0000 mg | ORAL_TABLET | Freq: Once | ORAL | Status: AC
  Administered 2018-03-20: 10 mg via ORAL
  Filled 2018-03-20: qty 2

## 2018-03-20 MED ORDER — SODIUM CHLORIDE 0.9% IV SOLUTION: Freq: Once | INTRAVENOUS | Status: DC

## 2018-03-20 NOTE — Discharge Summary (Signed)
Physician Discharge Summary  [name] ZOX:[id number] DOB: [birth year] DOA: 03/19/2018  PCP: [name], MD  Admit date: 03/19/2018 Discharge date: 03/22/18  Admitted From: [facility] Disposition:  Home   Recommendations for Outpatient Follow-up:  1. Follow up with PCP in 1-2 weeks 2. Please obtain BMP/CBC in one week   Home Health: YES Equipment/Devices: HHPT  Discharge Condition: Stable CODE STATUS: FULL Diet recommendation: Heart Healthy   Brief/Interim Summary: 83 y.o.femalewith medical history ofdiastolic CHF, PE, Parkinson's disease, venous stasis dermatitis,and dysphagiapresenting with numerous complaints today, but primarily her chief complaint is that of dyspnea and left-sided chest pain. Today represents the patient's fourth admission in the past 9 months. The patient states that she has had some intermittent dyspnea for the past 1 to 2 days. She has had a nonproductive cough. She has had some subjective fevers and chills. She denies any nausea, vomiting, diarrhea, abdominal pain, dysuria, hematuria. She states that her lower extremity edema has been about the same as usual. She states that her left-sided chest pain is under her left breast and has been intermittent and sharp in nature. It is intermittently positional.  Notably, the patient was recently discharged from the hospital after a stay from 02/22/2018 through 02/28/2018. At that time, the patient was treated for coagulopathy with INR greater than 10. In addition, she was found to have acute on chronic diastolic CHF. She was discharged home with furosemide 20 mg daily. However the patient states that she has not been taking her furosemide.  In the emergency department, the patient was afebrile hemodynamically stable saturating 96 to 98% on room air. BMP, LFTs, and CBC were essentially unremarkable. Urinalysis was negative for pyuria. BNP was 66.0. Chest x-ray showed chronic interstitial markings. INR  was greater than 10. EKG shows sinus rhythm with right bundle branch block.  Discharge Diagnoses:  Coagulopathy -It is unclear whether the patient is taking her warfarin properly -As discussed, the patient has not been taking her furosemide -INR remains >10 despite 10 mg vitamin K -repeat vitamin K  And give one unit FFP -Has been discussed with the patient in the past 2 days switch toa NOAC to which she refused in past -holding warfarin during hospitalization -INR initially improved to 2.20, increased to 3.21 with one dose of coumadin -I have recommended to pt stopping warfarin altogether after discussing risks, benefits in the setting of 2 admissions with INR >10 within 3 weeks -I will not prescribe any warfarin at time of d/c  -follow up appointment was set up with patient's PCP for the day after d/c on 03/23/18 at 1110  -home health was set up to check INR daily for the next week with results sent to her PCP to adjust warfarin  Dyspnea/Acute Bronchitis -Reviewed the medical record shows that the patient has had a chronic intermittent dyspnea -This is likely multifactorial including restrictive lung disease from the patient's body habitus, diastolic dysfunction, and possibly aspiration pneumonitis -also suspect a degree of underlying interstitial lung disease -BNP 66.0 -check procalcitonin -lasix IV x 1-->no change -viral respiratory panel--positive rhino/enterovirus  Chronic diastolic CHF -Difficult to clarify the patient's fluid status given her body habitus -Lasix IV x1 and reassess-->no significant change -AlthoughLasix allergy is listed, the patient has received furosemide and her previous hospital admission -clinically euvolemic -pt not taking furosemide at home  History of pulmonary embolus -Therapy multiple discussions with the patient in the past from discontinuation of warfarin altogether versus changing toNOAC -She is resistant to any type  of change -This  occurred over 10 years ago -Per patient history--patient has never had any previous PE nor any subsequent PE -check factor V Leiden, prothrombin gene mutation, lupus anticoagulant--all pending at time of d/c  Parkinson's Disease -no presently on any meds  Chest Pain -not a candidate for aggressive therapy -cycle troponin--neg x 3 -The patient is a poor historian but overall reports having worsening shortness of breath at rest and with activity   RBBB -chronic -personally reviewed EKG--sinus, unchanged RBBB  Social -Patient appears to have poor social circumstances at home -She lives alone with caregivers providing assistance approximately 8 hours daily -The patient normally has numerous complaints and requests, but frequently refuses appropriate medical care or any changes to the status quo resulting in substandard medical care   Discharge Instructions   Allergies as of 03/22/2018      Reactions   Contrast Media [iodinated Diagnostic Agents] Anaphylaxis, Shortness Of Breath   Vancomycin Shortness Of Breath   REACTION: SOB   Vioxx [rofecoxib]    REACTION: irregular heartbeat      Medication List    STOP taking these medications   diltiazem 120 MG 24 hr capsule Commonly known as:  CARDIZEM CD   furosemide 20 MG tablet Commonly known as:  LASIX   gabapentin 100 MG capsule Commonly known as:  NEURONTIN   warfarin 2 MG tablet Commonly known as:  COUMADIN      Follow-up Information    [name], MD Follow up on 03/23/2018.   Specialty:  Internal Medicine Why:  Follow up as previously scheduled. on 03/23/18 at 11:10 AM Contact information: [address] [place] [address] [phone]          Allergies  Allergen Reactions  . Contrast Media [Iodinated Diagnostic Agents] Anaphylaxis and Shortness Of Breath  . Vancomycin Shortness Of Breath    REACTION: SOB  . Vioxx [Rofecoxib]     REACTION: irregular heartbeat     Consultations:  Palliative medicine   Procedures/Studies: Dg Chest 2 View  Result Date: 02/23/2018 CLINICAL DATA:  Chest pain and shortness of breath. EXAM: CHEST - 2 VIEW COMPARISON:  12/14/2017 FINDINGS: Shallow inspiration with linear atelectasis in the right lung base. Cardiac enlargement. No vascular congestion, edema, or consolidation. No blunting of costophrenic angles. No pneumothorax. Degenerative changes in the spine with thoracic kyphosis and midthoracic vertebral compression deformities, unchanged since 12/20/2014. Calcification of the aorta. Degenerative changes in the shoulders. IMPRESSION: Shallow inspiration with linear atelectasis in the right lung base. Cardiac enlargement. No focal consolidation or edema. Electronically Signed   By: Burman Nieves M.D.   On: 02/23/2018 00:28   Dg Chest Port 1 View  Result Date: 03/19/2018 CLINICAL DATA:  Cough and dysphagia EXAM: PORTABLE CHEST 1 VIEW COMPARISON:  February 25, 2018 FINDINGS: Patient's mandible obscures much of the right upper lobe and a portion of the medial left upper lobe. Visualized lung regions appear clear. There is mild cardiomegaly with pulmonary vascularity normal. No adenopathy. No bone lesions. IMPRESSION: Visualized lungs clear. Note that portions of the upper lobes, particular on the right, obscured by patient's mandible. Stable cardiac prominence. Electronically Signed   By: Bretta Bang III M.D.   On: 03/19/2018 13:42   Dg Chest Port 1 View  Result Date: 02/25/2018 CLINICAL DATA:  Shortness of breath. History of CHF, pulmonary embolism, and Parkinson's disease. EXAM: PORTABLE CHEST 1 VIEW COMPARISON:  PA and lateral chest x-ray of August 22, 2017 FINDINGS: The lungs are  mildly hypoinflated. The right hemidiaphragm remains elevated as compared to the left. The interstitial markings of both lungs are increased. The cardiac silhouette is enlarged and the pulmonary vascularity engorged. There is no definite  pleural effusion. The bony thorax is unremarkable. IMPRESSION: CHF with mild interstitial edema.  No acute pneumonia. Electronically Signed   By: Levi Klaiber  [place] M.D.   On: 02/25/2018 14:51        Discharge Exam: Vitals:   03/21/18 2126 03/22/18 0652  BP: (!) 159/76 135/64  Pulse: 90 64  Resp: 18   Temp: 98 F (36.7 C) 98 F (36.7 C)  SpO2: 97% 98%   Vitals:   03/21/18 1500 03/21/18 2113 03/21/18 2126 03/22/18 0652  BP: (!) 160/70  (!) 159/76 135/64  Pulse: 71  90 64  Resp: 18  18   Temp: 98 F (36.7 C)  98 F (36.7 C) 98 F (36.7 C)  TempSrc:   Oral Oral  SpO2: 98% 95% 97% 98%  Weight:    44.7 kg  Height:        General: Pt is alert, awake, not in acute distress Cardiovascular: RRR, S1/S2 +, no rubs, no gallops Respiratory: CTA bilaterally, no wheezing, no rhonchi Abdominal: Soft, NT, ND, bowel sounds + Extremities: no edema, no cyanosis   The results of significant diagnostics from this hospitalization (including imaging, microbiology, ancillary and laboratory) are listed below for reference.    Significant Diagnostic Studies: Dg Chest 2 View  Result Date: 02/23/2018 CLINICAL DATA:  Chest pain and shortness of breath. EXAM: CHEST - 2 VIEW COMPARISON:  12/14/2017 FINDINGS: Shallow inspiration with linear atelectasis in the right lung base. Cardiac enlargement. No vascular congestion, edema, or consolidation. No blunting of costophrenic angles. No pneumothorax. Degenerative changes in the spine with thoracic kyphosis and midthoracic vertebral compression deformities, unchanged since 12/20/2014. Calcification of the aorta. Degenerative changes in the shoulders. IMPRESSION: Shallow inspiration with linear atelectasis in the right lung base. Cardiac enlargement. No focal consolidation or edema. Electronically Signed   By: Burman Nieves M.D.   On: 02/23/2018 00:28   Dg Chest Port 1 View  Result Date: 03/19/2018 CLINICAL DATA:  Cough and dysphagia EXAM: PORTABLE CHEST 1  VIEW COMPARISON:  February 25, 2018 FINDINGS: Patient's mandible obscures much of the right upper lobe and a portion of the medial left upper lobe. Visualized lung regions appear clear. There is mild cardiomegaly with pulmonary vascularity normal. No adenopathy. No bone lesions. IMPRESSION: Visualized lungs clear. Note that portions of the upper lobes, particular on the right, obscured by patient's mandible. Stable cardiac prominence. Electronically Signed   By: Bretta Bang III M.D.   On: 03/19/2018 13:42   Dg Chest Port 1 View  Result Date: 02/25/2018 CLINICAL DATA:  Shortness of breath. History of CHF, pulmonary embolism, and Parkinson's disease. EXAM: PORTABLE CHEST 1 VIEW COMPARISON:  PA and lateral chest x-ray of August 22, 2017 FINDINGS: The lungs are mildly hypoinflated. The right hemidiaphragm remains elevated as compared to the left. The interstitial markings of both lungs are increased. The cardiac silhouette is enlarged and the pulmonary vascularity engorged. There is no definite pleural effusion. The bony thorax is unremarkable. IMPRESSION: CHF with mild interstitial edema.  No acute pneumonia. Electronically Signed   By: Solace Wendorff  [place] M.D.   On: 02/25/2018 14:51     Microbiology: Recent Results (from the past 240 hour(s))  Respiratory Panel by PCR     Status: Abnormal   Collection Time: 03/19/18  6:08 PM  Result Value Ref Range Status   Adenovirus NOT DETECTED NOT DETECTED Final   Coronavirus 229E NOT DETECTED NOT DETECTED Final   Coronavirus HKU1 NOT DETECTED NOT DETECTED Final   Coronavirus NL63 NOT DETECTED NOT DETECTED Final   Coronavirus OC43 NOT DETECTED NOT DETECTED Final   Metapneumovirus NOT DETECTED NOT DETECTED Final   Rhinovirus / Enterovirus DETECTED (A) NOT DETECTED Final   Influenza A NOT DETECTED NOT DETECTED Final   Influenza B NOT DETECTED NOT DETECTED Final   Parainfluenza Virus 1 NOT DETECTED NOT DETECTED Final   Parainfluenza Virus 2 NOT DETECTED NOT  DETECTED Final   Parainfluenza Virus 3 NOT DETECTED NOT DETECTED Final   Parainfluenza Virus 4 NOT DETECTED NOT DETECTED Final   Respiratory Syncytial Virus NOT DETECTED NOT DETECTED Final   Bordetella pertussis NOT DETECTED NOT DETECTED Final   Chlamydophila pneumoniae NOT DETECTED NOT DETECTED Final   Mycoplasma pneumoniae NOT DETECTED NOT DETECTED Final    Comment: Performed at [facility], [address], [place], [place] [address]     Labs: Basic Metabolic Panel: Recent Labs  Lab 03/19/18 1211 03/20/18 0716 03/21/18 0613  NA 139 139 142  K 3.9 3.6 3.8  CL 108 103 107  CO2 27 28 28   GLUCOSE 94 100* 91  BUN 14 17 16   CREATININE 0.61 0.81 0.67  CALCIUM 8.6* 8.7* 8.8*   Liver Function Tests: Recent Labs  Lab 03/19/18 1211  AST 26  ALT 18  ALKPHOS 67  BILITOT 0.5  PROT 6.9  ALBUMIN 3.7   No results for input(s): LIPASE, AMYLASE in the last 168 hours. No results for input(s): AMMONIA in the last 168 hours. CBC: Recent Labs  Lab 03/19/18 1211 03/20/18 0716 03/21/18 0613 03/22/18 0500  WBC 5.8 5.6 5.2 5.6  NEUTROABS 3.9  --   --   --   HGB 13.5 13.8 12.6 12.9  HCT 42.3 42.5 40.1 41.3  MCV 88.5 88.0 88.7 88.6  PLT 181 182 176 190   Cardiac Enzymes: Recent Labs  Lab 03/19/18 1211 03/19/18 1743 03/19/18 2216  TROPONINI <0.03 <0.03 <0.03   BNP: Invalid input(s): POCBNP CBG: No results for input(s): GLUCAP in the last 168 hours.  Time coordinating discharge:  36 minutes  Signed:  Catarina Hartshorn, DO [facility] Pager: [phone] 03/22/2018, 5:46 PM

## 2018-03-20 NOTE — Progress Notes (Signed)
CRITICAL VALUE ALERT  Critical Value: INR >10.0  Date & Time Notied: 03/20/2018 8295  Provider Notified: Dr. Arbutus Leas

## 2018-03-20 NOTE — Progress Notes (Signed)
PROGRESS NOTE  [name] NWG:[id number] DOB: Oct 21, 1934 DOA: 03/19/2018 PCP: [name], MD  Brief History:   83 y.o. female with medical history of diastolic CHF, PE, Parkinson's disease, venous stasis dermatitis, and dysphagia presenting with numerous complaints today, but primarily her chief complaint is that of dyspnea and left-sided chest pain.  Today represents the patient's fourth admission in the past 9 months.  The patient states that she has had some intermittent dyspnea for the past 1 to 2 days.  She has had a nonproductive cough.  She has had some subjective fevers and chills.  She denies any nausea, vomiting, diarrhea, abdominal pain, dysuria, hematuria.  She states that her lower extremity edema has been about the same as usual.  She states that her left-sided chest pain is under her left breast and has been intermittent and sharp in nature.  It is intermittently positional.   Notably, the patient was recently discharged from the hospital after a stay from 02/22/2018 through 02/28/2018.  At that time, the patient was treated for coagulopathy with INR greater than 10.  In addition, she was found to have acute on chronic diastolic CHF.  She was discharged home with furosemide 20 mg daily.  However the patient states that she has not been taking her furosemide.  In the emergency department, the patient was afebrile hemodynamically stable saturating 96 to 98% on room air.  BMP, LFTs, and CBC were essentially unremarkable.  Urinalysis was negative for pyuria.  BNP was 66.0.  Chest x-ray showed chronic interstitial markings.  INR was greater than 10.  EKG shows sinus rhythm with right bundle branch block.   Assessment/Plan: Coagulopathy -It is unclear whether the patient is taking her warfarin properly -As discussed, the patient has not been taking her furosemide -INR remains >10 despite 10 mg vitamin K -repeat vitamin K  And give one unit FFP -Has been discussed with the  patient in the past 2 days switch to a NOAC to which she refused in past -hold warfarin  Dyspnea/Acute Bronchitis -Reviewed the medical record shows that the patient has had a chronic intermittent dyspnea -This is likely multifactorial including restrictive lung disease from the patient's body habitus, diastolic dysfunction, and possibly aspiration pneumonitis -also suspect a degree of underlying interstitial lung disease -BNP 66.0 -check procalcitonin -lasix IV x 1 and reassess -viral respiratory panel--positive rhino/enterovirus  Chronic diastolic CHF -Difficult to clarify the patient's fluid status given her body habitus -Lasix IV x1 and reassess-->no significant change -Although Lasix allergy is listed, the patient has received furosemide and her previous hospital admission -clinically euvolemic  History of pulmonary embolus -Therapy multiple discussions with the patient in the past from discontinuation of warfarin altogether versus changing to NOAC -She is resistant to any type of change -This occurred nearly 10 years ago -Per patient history--patient has never had any previous PE nor any subsequent PE -check factor V Leiden, prothrombin gene mutation, lupus anticoagulant  Parkinson's Disease -no presently on any meds  Chest Pain -not a candidate for aggressive therapy -cycle troponin--neg x 3 -The patient is a poor historian but overall reports having worsening shortness of breath at rest and with activity   RBBB -chronic -personally reviewed EKG--sinus, unchanged RBBB  Social -Patient appears to have poor social circumstances at home -She lives alone with caregivers providing assistance approximately 8 hours daily -The patient normally has numerous complaints and requests, but frequently refuses appropriate medical care or  any changes to the status quo resulting in substandard medical care    Disposition Plan:   Home in 1-2 days  Due to patient's poor  social situation and poor follow up and persistent coagulopathy witn INR > 10 despite vitamin K, she remains a high risk for bleeding and complication   Family Communication:   caretaker at bedside  Consultants:  none  Code Status:  FULL  DVT Prophylaxis: coumadin   Procedures: As Listed in Progress Note Above  Antibiotics: None    Subjective: Patient denies fevers, chills, headache, chest pain,  nausea, vomiting, diarrhea, abdominal pain, dysuria, hematuria, hematochezia, and melena. She complains of a little dyspnea  Objective: Vitals:   03/19/18 1633 03/19/18 1823 03/19/18 2134 03/20/18 0630  BP: 118/72 (!) 170/98 (!) 155/88 127/70  Pulse: 88 91 88 80  Resp: 16 18 20 16   Temp: 98.2 F (36.8 C) 98.3 F (36.8 C) 98.9 F (37.2 C) 98.2 F (36.8 C)  TempSrc: Oral Oral Oral Oral  SpO2: 97% 100% 98% 100%  Weight:      Height:        Intake/Output Summary (Last 24 hours) at 03/20/2018 1226 Last data filed at 03/20/2018 0900 Gross per 24 hour  Intake 600 ml  Output 700 ml  Net -100 ml   Weight change:  Exam:   General:  Pt is alert, follows commands appropriately, not in acute distress  HEENT: No icterus, No thrush, No neck mass, [place]/AT  Cardiovascular: RRR, S1/S2, no rubs, no gallops  Respiratory: fine basilar crackles  Abdomen: Soft/+BS, non tender, non distended, no guarding  Extremities: No edema, No lymphangitis, No petechiae, No rashes, no synovitis   Data Reviewed: I have personally reviewed following labs and imaging studies Basic Metabolic Panel: Recent Labs  Lab 03/19/18 1211 03/20/18 0716  NA 139 139  K 3.9 3.6  CL 108 103  CO2 27 28  GLUCOSE 94 100*  BUN 14 17  CREATININE 0.61 0.81  CALCIUM 8.6* 8.7*   Liver Function Tests: Recent Labs  Lab 03/19/18 1211  AST 26  ALT 18  ALKPHOS 67  BILITOT 0.5  PROT 6.9  ALBUMIN 3.7   No results for input(s): LIPASE, AMYLASE in the last 168 hours. No results for input(s): AMMONIA in the  last 168 hours. Coagulation Profile: Recent Labs  Lab 03/19/18 1211 03/20/18 0716  INR >10.00* >10.00*   CBC: Recent Labs  Lab 03/19/18 1211 03/20/18 0716  WBC 5.8 5.6  NEUTROABS 3.9  --   HGB 13.5 13.8  HCT 42.3 42.5  MCV 88.5 88.0  PLT 181 182   Cardiac Enzymes: Recent Labs  Lab 03/19/18 1211 03/19/18 1743 03/19/18 2216  TROPONINI <0.03 <0.03 <0.03   BNP: Invalid input(s): POCBNP CBG: No results for input(s): GLUCAP in the last 168 hours. HbA1C: No results for input(s): HGBA1C in the last 72 hours. Urine analysis:    Component Value Date/Time   COLORURINE STRAW (A) 03/19/2018 1221   APPEARANCEUR CLEAR 03/19/2018 1221   LABSPEC 1.008 03/19/2018 1221   PHURINE 8.0 03/19/2018 1221   GLUCOSEU NEGATIVE 03/19/2018 1221   HGBUR LARGE (A) 03/19/2018 1221   BILIRUBINUR NEGATIVE 03/19/2018 1221   KETONESUR NEGATIVE 03/19/2018 1221   PROTEINUR NEGATIVE 03/19/2018 1221   UROBILINOGEN 0.2 12/20/2014 2054   NITRITE NEGATIVE 03/19/2018 1221   LEUKOCYTESUR NEGATIVE 03/19/2018 1221   Sepsis Labs: @LABRCNTIP (procalcitonin:4,lacticidven:4) ) Recent Results (from the past 240 hour(s))  Respiratory Panel by PCR     Status: Abnormal  Collection Time: 03/19/18  6:08 PM  Result Value Ref Range Status   Adenovirus NOT DETECTED NOT DETECTED Final   Coronavirus 229E NOT DETECTED NOT DETECTED Final   Coronavirus HKU1 NOT DETECTED NOT DETECTED Final   Coronavirus NL63 NOT DETECTED NOT DETECTED Final   Coronavirus OC43 NOT DETECTED NOT DETECTED Final   Metapneumovirus NOT DETECTED NOT DETECTED Final   Rhinovirus / Enterovirus DETECTED (A) NOT DETECTED Final   Influenza A NOT DETECTED NOT DETECTED Final   Influenza B NOT DETECTED NOT DETECTED Final   Parainfluenza Virus 1 NOT DETECTED NOT DETECTED Final   Parainfluenza Virus 2 NOT DETECTED NOT DETECTED Final   Parainfluenza Virus 3 NOT DETECTED NOT DETECTED Final   Parainfluenza Virus 4 NOT DETECTED NOT DETECTED Final    Respiratory Syncytial Virus NOT DETECTED NOT DETECTED Final   Bordetella pertussis NOT DETECTED NOT DETECTED Final   Chlamydophila pneumoniae NOT DETECTED NOT DETECTED Final   Mycoplasma pneumoniae NOT DETECTED NOT DETECTED Final    Comment: Performed at [facility], [address] [place], [place] [address]     Scheduled Meds: . sodium chloride   Intravenous Once  . magic mouthwash  10 mL Oral QID   Continuous Infusions:  Procedures/Studies: Dg Chest 2 View  Result Date: 02/23/2018 CLINICAL DATA:  Chest pain and shortness of breath. EXAM: CHEST - 2 VIEW COMPARISON:  12/14/2017 FINDINGS: Shallow inspiration with linear atelectasis in the right lung base. Cardiac enlargement. No vascular congestion, edema, or consolidation. No blunting of costophrenic angles. No pneumothorax. Degenerative changes in the spine with thoracic kyphosis and midthoracic vertebral compression deformities, unchanged since 12/20/2014. Calcification of the aorta. Degenerative changes in the shoulders. IMPRESSION: Shallow inspiration with linear atelectasis in the right lung base. Cardiac enlargement. No focal consolidation or edema. Electronically Signed   By: Burman Nieves M.D.   On: 02/23/2018 00:28   Dg Chest Port 1 View  Result Date: 03/19/2018 CLINICAL DATA:  Cough and dysphagia EXAM: PORTABLE CHEST 1 VIEW COMPARISON:  February 25, 2018 FINDINGS: Patient's mandible obscures much of the right upper lobe and a portion of the medial left upper lobe. Visualized lung regions appear clear. There is mild cardiomegaly with pulmonary vascularity normal. No adenopathy. No bone lesions. IMPRESSION: Visualized lungs clear. Note that portions of the upper lobes, particular on the right, obscured by patient's mandible. Stable cardiac prominence. Electronically Signed   By: Bretta Bang III M.D.   On: 03/19/2018 13:42   Dg Chest Port 1 View  Result Date: 02/25/2018 CLINICAL DATA:  Shortness of breath. History of  CHF, pulmonary embolism, and Parkinson's disease. EXAM: PORTABLE CHEST 1 VIEW COMPARISON:  PA and lateral chest x-ray of August 22, 2017 FINDINGS: The lungs are mildly hypoinflated. The right hemidiaphragm remains elevated as compared to the left. The interstitial markings of both lungs are increased. The cardiac silhouette is enlarged and the pulmonary vascularity engorged. There is no definite pleural effusion. The bony thorax is unremarkable. IMPRESSION: CHF with mild interstitial edema.  No acute pneumonia. Electronically Signed   By: Claudius Mich  [place] M.D.   On: 02/25/2018 14:51    Catarina Hartshorn, DO  [facility] Pager [phone]  If 7PM-7AM, please contact night-coverage [email or web address] Password [email or web address] 03/20/2018, 12:26 PM   LOS: 0 days

## 2018-03-21 DIAGNOSIS — I872 Venous insufficiency (chronic) (peripheral): Secondary | ICD-10-CM | POA: Diagnosis present

## 2018-03-21 DIAGNOSIS — Z515 Encounter for palliative care: Secondary | ICD-10-CM | POA: Diagnosis not present

## 2018-03-21 DIAGNOSIS — G20 Parkinson's disease: Secondary | ICD-10-CM | POA: Diagnosis present

## 2018-03-21 DIAGNOSIS — Z86711 Personal history of pulmonary embolism: Secondary | ICD-10-CM | POA: Diagnosis not present

## 2018-03-21 DIAGNOSIS — Z888 Allergy status to other drugs, medicaments and biological substances status: Secondary | ICD-10-CM | POA: Diagnosis not present

## 2018-03-21 DIAGNOSIS — M40294 Other kyphosis, thoracic region: Secondary | ICD-10-CM | POA: Diagnosis present

## 2018-03-21 DIAGNOSIS — I4510 Unspecified right bundle-branch block: Secondary | ICD-10-CM | POA: Diagnosis present

## 2018-03-21 DIAGNOSIS — D689 Coagulation defect, unspecified: Secondary | ICD-10-CM | POA: Diagnosis not present

## 2018-03-21 DIAGNOSIS — Z881 Allergy status to other antibiotic agents status: Secondary | ICD-10-CM | POA: Diagnosis not present

## 2018-03-21 DIAGNOSIS — R319 Hematuria, unspecified: Secondary | ICD-10-CM | POA: Diagnosis present

## 2018-03-21 DIAGNOSIS — B9710 Unspecified enterovirus as the cause of diseases classified elsewhere: Secondary | ICD-10-CM | POA: Diagnosis present

## 2018-03-21 DIAGNOSIS — Z91041 Radiographic dye allergy status: Secondary | ICD-10-CM | POA: Diagnosis not present

## 2018-03-21 DIAGNOSIS — J209 Acute bronchitis, unspecified: Secondary | ICD-10-CM | POA: Diagnosis present

## 2018-03-21 DIAGNOSIS — Z8249 Family history of ischemic heart disease and other diseases of the circulatory system: Secondary | ICD-10-CM | POA: Diagnosis not present

## 2018-03-21 DIAGNOSIS — R05 Cough: Secondary | ICD-10-CM | POA: Diagnosis present

## 2018-03-21 DIAGNOSIS — I5032 Chronic diastolic (congestive) heart failure: Secondary | ICD-10-CM | POA: Diagnosis present

## 2018-03-21 DIAGNOSIS — J849 Interstitial pulmonary disease, unspecified: Secondary | ICD-10-CM | POA: Diagnosis present

## 2018-03-21 DIAGNOSIS — Z7189 Other specified counseling: Secondary | ICD-10-CM | POA: Diagnosis not present

## 2018-03-21 DIAGNOSIS — T501X6A Underdosing of loop [high-ceiling] diuretics, initial encounter: Secondary | ICD-10-CM | POA: Diagnosis present

## 2018-03-21 DIAGNOSIS — Z7901 Long term (current) use of anticoagulants: Secondary | ICD-10-CM | POA: Diagnosis not present

## 2018-03-21 DIAGNOSIS — R1310 Dysphagia, unspecified: Secondary | ICD-10-CM | POA: Diagnosis present

## 2018-03-21 DIAGNOSIS — R791 Abnormal coagulation profile: Secondary | ICD-10-CM | POA: Diagnosis present

## 2018-03-21 DIAGNOSIS — Z79899 Other long term (current) drug therapy: Secondary | ICD-10-CM | POA: Diagnosis not present

## 2018-03-21 LAB — PREPARE FRESH FROZEN PLASMA: UNIT DIVISION: 0

## 2018-03-21 LAB — BASIC METABOLIC PANEL
ANION GAP: 7 (ref 5–15)
BUN: 16 mg/dL (ref 8–23)
CO2: 28 mmol/L (ref 22–32)
Calcium: 8.8 mg/dL — ABNORMAL LOW (ref 8.9–10.3)
Chloride: 107 mmol/L (ref 98–111)
Creatinine, Ser: 0.67 mg/dL (ref 0.44–1.00)
GFR calc Af Amer: >60 mL/min (ref >60)
GFR calc non Af Amer: >60 mL/min (ref >60)
GLUCOSE: 91 mg/dL (ref 70–99)
POTASSIUM: 3.8 mmol/L (ref 3.5–5.1)
SODIUM: 142 mmol/L (ref 135–145)

## 2018-03-21 LAB — CBC
HCT: 40.1 % (ref 36.0–46.0)
HEMOGLOBIN: 12.6 g/dL (ref 12.0–15.0)
MCH: 27.9 pg (ref 26.0–34.0)
MCHC: 31.4 g/dL (ref 30.0–36.0)
MCV: 88.7 fL (ref 78.0–100.0)
PLATELETS: 176 10*3/uL (ref 150–400)
RBC: 4.52 MIL/uL (ref 3.87–5.11)
RDW: 13.6 % (ref 11.5–15.5)
WBC: 5.2 10*3/uL (ref 4.0–10.5)

## 2018-03-21 LAB — PROTIME-INR
INR: 2.22
Prothrombin Time: 24.4 seconds — ABNORMAL HIGH (ref 11.4–15.2)

## 2018-03-21 LAB — BPAM FFP
BLOOD PRODUCT EXPIRATION DATE: 201910042359
ISSUE DATE / TIME: 201909291403
UNIT TYPE AND RH: 6200

## 2018-03-21 MED ORDER — WARFARIN SODIUM 2 MG PO TABS
2.0000 mg | ORAL_TABLET | Freq: Once | ORAL | Status: AC
  Administered 2018-03-21: 2 mg via ORAL
  Filled 2018-03-21: qty 1

## 2018-03-21 MED ORDER — WARFARIN - PHARMACIST DOSING INPATIENT
Freq: Every day | Status: DC
  Administered 2018-03-22: 16:00:00

## 2018-03-21 NOTE — Progress Notes (Signed)
PROGRESS NOTE  [name] ZOX:[id number] DOB: June 07, 1935 DOA: 03/19/2018 PCP: [name], MD  Brief History:  83 y.o.femalewith medical history ofdiastolic CHF, PE, Parkinson's disease, venous stasis dermatitis,and dysphagiapresenting with numerous complaints today, but primarily her chief complaint is that of dyspnea and left-sided chest pain. Today represents the patient's fourth admission in the past 9 months. The patient states that she has had some intermittent dyspnea for the past 1 to 2 days. She has had a nonproductive cough. She has had some subjective fevers and chills. She denies any nausea, vomiting, diarrhea, abdominal pain, dysuria, hematuria. She states that her lower extremity edema has been about the same as usual. She states that her left-sided chest pain is under her left breast and has been intermittent and sharp in nature. It is intermittently positional. Notably, the patient was recently discharged from the hospital after a stay from 02/22/2018 through 02/28/2018. At that time, the patient was treated for coagulopathy with INR greater than 10. In addition, she was found to have acute on chronic diastolic CHF. She was discharged home with furosemide 20 mg daily. However the patient states that she has not been taking her furosemide.  In the emergency department, the patient was afebrile hemodynamically stable saturating 96 to 98% on room air. BMP, LFTs, and CBC were essentially unremarkable. Urinalysis was negative for pyuria. BNP was 66.0. Chest x-ray showed chronic interstitial markings. INR was greater than 10. EKG shows sinus rhythm with right bundle branch block.  Assessment/Plan: Coagulopathy -It is unclear whether the patient is taking her warfarin properly -As discussed, the patient has not been taking her furosemide -INR remains >10 despite 10 mg vitamin K -repeated vitamin K 10 mg and gave one unit FFP -Has been discussed with the  patient in the past 2 days switch toa NOAC to which she refused currently and in past -03/21/18 INR 2.22 -repeat INR in am  Dyspnea/Acute Bronchitis -Reviewed the medical record shows that the patient has had a chronic intermittent dyspnea -This is likely multifactorial including restrictive lung disease from the patient's body habitus, diastolic dysfunction, and possibly aspiration pneumonitis -also suspect a degree of underlying interstitial lung disease -BNP 66.0 -lasix IV x 1-->no change clinically -viral respiratory panel--positive rhino/enterovirus  Chronic diastolic CHF -Difficult to clarify the patient's fluid status given her body habitus -Lasix IV x1 and reassess-->no significant change clinically -AlthoughLasix allergy is listed, the patient has received furosemide and her previous hospital admission -clinically euvolemic  History of pulmonary embolus -Therapy multiple discussions with the patient in the past from discontinuation of warfarin altogether versus changing toNOAC -She is resistant to any type of change -PE occurred over 10 years ago -Per patient history--patient has never had any family hx of VTE previous PE nor any subsequent PE -check factor V Leiden, prothrombin gene mutation, lupus anticoagulant -pt wishes to continue warfarin after discussion or risks, benefits, risks  Parkinson's Disease -no presently on any meds  Chest Pain -not a candidate for aggressive therapy -cycle troponin--neg x 3 -The patient is a poor historian but overall reports having worsening shortness of breath at rest and with activity  RBBB -chronic -personally reviewed EKG--sinus, unchanged RBBB  Social -Patient appears to have poor social circumstances at home -She lives alone with caregivers providing assistance approximately 8 hours daily -The patient normally has numerous complaints and requests, but frequently refuses appropriate medical care or any changes to  the status quo resulting in  substandard medical care -long discussion with pt and sister--pt lives in house owned by sister and pt's caregivers are unreliable -consult palliative medicine   Disposition Plan:   Home vs SNF 03/22/18--Due to patient's poor social situation and poor follow up and persistent coagulopathy witn INR > 10 despite vitamin K, she remains a high risk for bleeding and complication  Family Communication:   Sister updated at bedside--Total time spent 35 minutes.  Greater than 50% spent face to face counseling and coordinating care.   Consultants:  none  Code Status:  FULL  DVT Prophylaxis:  coumadin   Procedures: As Listed in Progress Note Above  Antibiotics: None    Subjective: Patient denies fevers, chills, headache, chest pain, dyspnea, nausea, vomiting, diarrhea, abdominal pain, dysuria, hematuria, hematochezia, and melena.   Objective: Vitals:   03/20/18 1424 03/20/18 1626 03/20/18 2100 03/21/18 0629  BP: 103/64 (!) 159/94 (!) 155/75 (!) 154/69  Pulse: 97 88 92 66  Resp: 16 16 18 17   Temp: 98.5 F (36.9 C) 98.7 F (37.1 C) 98.1 F (36.7 C) 98 F (36.7 C)  TempSrc: Oral Oral Oral Oral  SpO2: 97% 99% 97% 98%  Weight:    69.6 kg  Height:        Intake/Output Summary (Last 24 hours) at 03/21/2018 1625 Last data filed at 03/21/2018 4098 Gross per 24 hour  Intake 360 ml  Output 100 ml  Net 260 ml   Weight change: -1.04 kg Exam:   General:  Pt is alert, follows commands appropriately, not in acute distress  HEENT: No icterus, No thrush, No neck mass, [place]/AT  Cardiovascular: RRR, S1/S2, no rubs, no gallops  Respiratory: fine bibasilar crackles, no wheeze  Abdomen: Soft/+BS, non tender, non distended, no guarding  Extremities: No edema, No lymphangitis, No petechiae, No rashes, no synovitis   Data Reviewed: I have personally reviewed following labs and imaging studies Basic Metabolic Panel: Recent Labs  Lab 03/19/18 1211  03/20/18 0716 03/21/18 0613  NA 139 139 142  K 3.9 3.6 3.8  CL 108 103 107  CO2 27 28 28   GLUCOSE 94 100* 91  BUN 14 17 16   CREATININE 0.61 0.81 0.67  CALCIUM 8.6* 8.7* 8.8*   Liver Function Tests: Recent Labs  Lab 03/19/18 1211  AST 26  ALT 18  ALKPHOS 67  BILITOT 0.5  PROT 6.9  ALBUMIN 3.7   No results for input(s): LIPASE, AMYLASE in the last 168 hours. No results for input(s): AMMONIA in the last 168 hours. Coagulation Profile: Recent Labs  Lab 03/19/18 1211 03/20/18 0716 03/21/18 0613  INR >10.00* >10.00* 2.22   CBC: Recent Labs  Lab 03/19/18 1211 03/20/18 0716 03/21/18 0613  WBC 5.8 5.6 5.2  NEUTROABS 3.9  --   --   HGB 13.5 13.8 12.6  HCT 42.3 42.5 40.1  MCV 88.5 88.0 88.7  PLT 181 182 176   Cardiac Enzymes: Recent Labs  Lab 03/19/18 1211 03/19/18 1743 03/19/18 2216  TROPONINI <0.03 <0.03 <0.03   BNP: Invalid input(s): POCBNP CBG: No results for input(s): GLUCAP in the last 168 hours. HbA1C: No results for input(s): HGBA1C in the last 72 hours. Urine analysis:    Component Value Date/Time   COLORURINE STRAW (A) 03/19/2018 1221   APPEARANCEUR CLEAR 03/19/2018 1221   LABSPEC 1.008 03/19/2018 1221   PHURINE 8.0 03/19/2018 1221   GLUCOSEU NEGATIVE 03/19/2018 1221   HGBUR LARGE (A) 03/19/2018 1221   BILIRUBINUR NEGATIVE 03/19/2018 1221   KETONESUR NEGATIVE 03/19/2018  1221   PROTEINUR NEGATIVE 03/19/2018 1221   UROBILINOGEN 0.2 12/20/2014 2054   NITRITE NEGATIVE 03/19/2018 1221   LEUKOCYTESUR NEGATIVE 03/19/2018 1221   Sepsis Labs: @LABRCNTIP (procalcitonin:4,lacticidven:4) ) Recent Results (from the past 240 hour(s))  Respiratory Panel by PCR     Status: Abnormal   Collection Time: 03/19/18  6:08 PM  Result Value Ref Range Status   Adenovirus NOT DETECTED NOT DETECTED Final   Coronavirus 229E NOT DETECTED NOT DETECTED Final   Coronavirus HKU1 NOT DETECTED NOT DETECTED Final   Coronavirus NL63 NOT DETECTED NOT DETECTED Final    Coronavirus OC43 NOT DETECTED NOT DETECTED Final   Metapneumovirus NOT DETECTED NOT DETECTED Final   Rhinovirus / Enterovirus DETECTED (A) NOT DETECTED Final   Influenza A NOT DETECTED NOT DETECTED Final   Influenza B NOT DETECTED NOT DETECTED Final   Parainfluenza Virus 1 NOT DETECTED NOT DETECTED Final   Parainfluenza Virus 2 NOT DETECTED NOT DETECTED Final   Parainfluenza Virus 3 NOT DETECTED NOT DETECTED Final   Parainfluenza Virus 4 NOT DETECTED NOT DETECTED Final   Respiratory Syncytial Virus NOT DETECTED NOT DETECTED Final   Bordetella pertussis NOT DETECTED NOT DETECTED Final   Chlamydophila pneumoniae NOT DETECTED NOT DETECTED Final   Mycoplasma pneumoniae NOT DETECTED NOT DETECTED Final    Comment: Performed at [facility], [address] [place], [place] [address]     Scheduled Meds: . sodium chloride   Intravenous Once  . magic mouthwash  10 mL Oral QID   Continuous Infusions:  Procedures/Studies: Dg Chest 2 View  Result Date: 02/23/2018 CLINICAL DATA:  Chest pain and shortness of breath. EXAM: CHEST - 2 VIEW COMPARISON:  12/14/2017 FINDINGS: Shallow inspiration with linear atelectasis in the right lung base. Cardiac enlargement. No vascular congestion, edema, or consolidation. No blunting of costophrenic angles. No pneumothorax. Degenerative changes in the spine with thoracic kyphosis and midthoracic vertebral compression deformities, unchanged since 12/20/2014. Calcification of the aorta. Degenerative changes in the shoulders. IMPRESSION: Shallow inspiration with linear atelectasis in the right lung base. Cardiac enlargement. No focal consolidation or edema. Electronically Signed   By: Burman Nieves M.D.   On: 02/23/2018 00:28   Dg Chest Port 1 View  Result Date: 03/19/2018 CLINICAL DATA:  Cough and dysphagia EXAM: PORTABLE CHEST 1 VIEW COMPARISON:  February 25, 2018 FINDINGS: Patient's mandible obscures much of the right upper lobe and a portion of the medial  left upper lobe. Visualized lung regions appear clear. There is mild cardiomegaly with pulmonary vascularity normal. No adenopathy. No bone lesions. IMPRESSION: Visualized lungs clear. Note that portions of the upper lobes, particular on the right, obscured by patient's mandible. Stable cardiac prominence. Electronically Signed   By: Bretta Bang III M.D.   On: 03/19/2018 13:42   Dg Chest Port 1 View  Result Date: 02/25/2018 CLINICAL DATA:  Shortness of breath. History of CHF, pulmonary embolism, and Parkinson's disease. EXAM: PORTABLE CHEST 1 VIEW COMPARISON:  PA and lateral chest x-ray of August 22, 2017 FINDINGS: The lungs are mildly hypoinflated. The right hemidiaphragm remains elevated as compared to the left. The interstitial markings of both lungs are increased. The cardiac silhouette is enlarged and the pulmonary vascularity engorged. There is no definite pleural effusion. The bony thorax is unremarkable. IMPRESSION: CHF with mild interstitial edema.  No acute pneumonia. Electronically Signed   By: Ewel Lona  [place] M.D.   On: 02/25/2018 14:51    Catarina Hartshorn, DO  [facility] Pager [phone]  If 7PM-7AM,  please contact night-coverage [email or web address] Password [email or web address] 03/21/2018, 4:25 PM   LOS: 0 days

## 2018-03-21 NOTE — Evaluation (Signed)
Physical Therapy Evaluation Patient Details Name: KAMA CAMMARANO MRN: [id number] DOB: Aug 19, 1934 Today's Date: 03/21/2018   History of Present Illness  Willadeen Rogel is an 83yo female who comes to [facility] on 9/28 c dyspnea and Lt CP. Pt also arrives with INR> 10, quesitonable for poor meds compliance at home.  PMH: diastolic CHF, PE, venous stasis dermatitis, dysphagia, 10 year Parkinsonism without medical management per pt preference. At baseline, pt AMB short distances in home with aides, is dependent for ADL, has severe cervical dystonia.   Clinical Impression  Pt admitted with above diagnosis. Pt currently with functional limitations due to the deficits listed below (see "PT Problem List"). Upon entry, pt in bed, family/caregiver present who step out during eval. The pt is awake and agreeable to participate. The pt is alert and oriented x3, pleasant, conversational, and following simple commands consistently. Interaction with patient is consistent with typical moderate-late parkinsonian presentation, hypophonic, flat affect, additional time needed for verbal responses with bradyphonia, 'pill rolling' tremor bilat (which worsens when pt becomes slightly agitated), limb rigidity, and bradykinesia. Examination reveals WNL pedal sensation and localization. Global strength is 4-/5, but ability to mobilize in bed and position self for comfort is severely limited. Functional mobility assessment demonstrates increased effort/time requirements, poor tolerance, and need for physical assistance, whereas the patient performed these at a higher level of independence PTA. High-frequency, high-volume PT services at [facility] would be best opportunity to improve patient independence in basic mobility and ADL. Pt is most concerned with severe posturing of cervical spine, however this is not likely to change significantly from therapy services alone, especially in the setting of pt disinterest in utilization of Parkinsonian medial  management. Pt will benefit from skilled PT intervention to increase independence and safety with basic mobility in preparation for discharge to the venue listed below.       Follow Up Recommendations SNF;Supervision/Assistance - 24 hour    Equipment Recommendations  None recommended by PT    Recommendations for Other Services       Precautions / Restrictions Precautions Precautions: Fall Restrictions Weight Bearing Restrictions: No      Mobility  Bed Mobility               General bed mobility comments: For scooting and rolling pt requires +2 total assist. Attempt sto roll resul tin UE movement only without trunk movement.   Transfers Overall transfer level: (not appropriate at this time. Pt refusing, and nursing team reports pt unable to transfer to [facility].)                  Ambulation/Gait                Stairs            Wheelchair Mobility    Modified Rankin (Stroke Patients Only)       Balance                                             Pertinent Vitals/Pain Pain Assessment: (pt does not answer question)    Home Living Family/patient expects to be discharged to:: Private residence Living Arrangements: Alone;Non-relatives/Friends;Other (Comment) Available Help at Discharge: Personal care attendant Type of Home: House Home Access: Level entry;Stairs to enter Entrance Stairs-Rails: None Entrance Stairs-Number of Steps: 1 Home Layout: One level Home Equipment: Walker - 4 wheels;Wheelchair -  manual;Grab bars - tub/shower      Prior Function Level of Independence: Needs assistance   Gait / Transfers Assistance Needed: Assisted ambulation with 4 wheeled walker short household distances  ADL's / Homemaking Assistance Needed: home aides from 8am to 4 pm and 4 pm to 8 pm x 7 days/week        Hand Dominance   Dominant Hand: Right    Extremity/Trunk Assessment   Upper Extremity Assessment Upper Extremity  Assessment: Generalized weakness(strong grip bilat, bradykinetic and rigid BUE grossly 4-/5)    Lower Extremity Assessment Lower Extremity Assessment: Generalized weakness(ankle and knee contractures with >70% ROM loss bilat, 3+/5 MMT. )    Cervical / Trunk Assessment Cervical / Trunk Assessment: Kyphotic;Other exceptions Cervical / Trunk Exceptions: Pt is stuck in rigid end range cervical flexion  Communication   Communication: Expressive difficulties(typical parkinsonian hypophonia and increased procesinging time )  Cognition Arousal/Alertness: Awake/alert Behavior During Therapy: WFL for tasks assessed/performed;Flat affect Overall Cognitive Status: Within Functional Limits for tasks assessed                                        General Comments      Exercises     Assessment/Plan    PT Assessment Patient needs continued PT services  PT Problem List Decreased strength;Decreased activity tolerance;Decreased balance;Decreased mobility;Decreased range of motion;Decreased knowledge of precautions       PT Treatment Interventions Gait training;Functional mobility training;Therapeutic activities;Therapeutic exercise;Patient/family education    PT Goals (Current goals can be found in the Care Plan section)  Acute Rehab PT Goals Patient Stated Goal: return home with home aides to assist PT Goal Formulation: With patient Time For Goal Achievement: 03/28/18 Potential to Achieve Goals: Fair    Frequency Min 2X/week   Barriers to discharge Decreased caregiver support needs 24/7 caregiver assistance, currently has 12hour assistance    Co-evaluation               AM-PAC PT "6 Clicks" Daily Activity  Outcome Measure Difficulty turning over in bed (including adjusting bedclothes, sheets and blankets)?: Unable Difficulty moving from lying on back to sitting on the side of the bed? : Unable Difficulty sitting down on and standing up from a chair with arms  (e.g., wheelchair, bedside commode, etc,.)?: Unable Help needed moving to and from a bed to chair (including a wheelchair)?: Total Help needed walking in hospital room?: Total Help needed climbing 3-5 steps with a railing? : Total 6 Click Score: 6    End of Session Equipment Utilized During Treatment: Gait belt Activity Tolerance: Patient tolerated treatment well Patient left: in bed;with call bell/phone within reach;with family/visitor present Nurse Communication: Mobility status PT Visit Diagnosis: Unsteadiness on feet (R26.81);Other abnormalities of gait and mobility (R26.89);Muscle weakness (generalized) (M62.81)    Time: 2956-2130 PT Time Calculation (min) (ACUTE ONLY): 27 min   Charges:   PT Evaluation $PT Eval High Complexity: 1 High          2:15 PM, 03/21/18 Rosamaria Lints, PT, DPT Physical Therapist - [place] [phone] (Office)  Zhane Bluitt C 03/21/2018, 2:08 PM

## 2018-03-21 NOTE — Progress Notes (Signed)
ANTICOAGULATION CONSULT NOTE  Pharmacy Consult for Coumadin Indication: pulmonary embolus/VTE tx  Allergies  Allergen Reactions  . Contrast Media [Iodinated Diagnostic Agents] Anaphylaxis and Shortness Of Breath  . Vancomycin Shortness Of Breath    REACTION: SOB  . Vioxx [Rofecoxib]     REACTION: irregular heartbeat    Patient Measurements: Height: 5\' 4"  (162.6 cm) Weight: 153 lb 7 oz (69.6 kg) IBW/kg (Calculated) : 54.7  Vital Signs: Temp: 98 F (36.7 C) (09/30 0629) Temp Source: Oral (09/30 0629) BP: 154/69 (09/30 0629) Pulse Rate: 66 (09/30 0629)  Labs: Recent Labs    03/19/18 1211 03/19/18 1743 03/19/18 2216 03/20/18 0716 03/21/18 0613  HGB 13.5  --   --  13.8 12.6  HCT 42.3  --   --  42.5 40.1  PLT 181  --   --  182 176  LABPROT >90.0*  --   --  85.2* 24.4*  INR >10.00*  --   --  >10.00* 2.22  CREATININE 0.61  --   --  0.81 0.67  TROPONINI <0.03 <0.03 <0.03  --   --     Estimated Creatinine Clearance: 52.8 mL/min (by C-G formula based on SCr of 0.67 mg/dL).   Medical History: Past Medical History:  Diagnosis Date  . CHF (congestive heart failure) (HCC)   . History of pulmonary embolism   . Parkinson's disease (HCC)   . Venous stasis     Medications:  Medications Prior to Admission  Medication Sig Dispense Refill Last Dose  . warfarin (COUMADIN) 2 MG tablet Take 1 tablet (2 mg total) by mouth daily at 6 PM. Take one tab daily or Take as directed by anticoagulation clinic. Start after 9/11 if INR<3 30 tablet 0 03/18/2018 at 1800  . diltiazem (CARDIZEM CD) 120 MG 24 hr capsule Take 1 capsule (120 mg total) by mouth daily. (Patient not taking: Reported on 03/19/2018) 30 capsule 0 Not Taking at Unknown time  . furosemide (LASIX) 20 MG tablet Take 1 tablet (20 mg total) by mouth daily. (Patient not taking: Reported on 03/19/2018) 30 tablet 0 Not Taking at Unknown time  . gabapentin (NEURONTIN) 100 MG capsule Take 1 capsule (100 mg total) by mouth 3 (three)  times daily. (Patient not taking: Reported on 03/19/2018) 90 capsule 0 Not Taking at Unknown time    Assessment: 83 yo female who presented to ED with dyspnea. She take chronic coumadin for previous PE. On admission her  INR >10 on 9/28 and 9/29. Vitamin K 10 mg PO given 9/28 and 9/29.   INR is now 2.22 .  Pharmacy asked to restart Coumadin. At last admission on discharge 02/28/2018. Dr. Arbutus Leas discussed with patient and suggested eliquis as alternative but pt refused.  Home dose: 2mg  daily (unsure if patient taking correctly, may have used previous coumadin)  Goal of Therapy:  INR 2-3 Monitor platelets by anticoagulation protocol: Yes   Plan:  Warfarin 2mg  x 1 dose PT- INR daily Monitor for s/s of bleeding  Elder Cyphers, BS Loura Back, BCPS Clinical Pharmacist Pager [phone] 03/21/2018 4:41 PM

## 2018-03-21 NOTE — Progress Notes (Signed)
INR still supra therapeutic, no bleeding/bruising/hematoma noted. Awaiting am labs results. Patient slept well with no issues. Will continue to monitor.

## 2018-03-22 ENCOUNTER — Encounter (HOSPITAL_COMMUNITY): Payer: Self-pay | Admitting: Primary Care

## 2018-03-22 DIAGNOSIS — Z7189 Other specified counseling: Secondary | ICD-10-CM

## 2018-03-22 DIAGNOSIS — Z515 Encounter for palliative care: Secondary | ICD-10-CM

## 2018-03-22 LAB — CBC
HEMATOCRIT: 41.3 % (ref 36.0–46.0)
HEMOGLOBIN: 12.9 g/dL (ref 12.0–15.0)
MCH: 27.7 pg (ref 26.0–34.0)
MCHC: 31.2 g/dL (ref 30.0–36.0)
MCV: 88.6 fL (ref 78.0–100.0)
Platelets: 190 10*3/uL (ref 150–400)
RBC: 4.66 MIL/uL (ref 3.87–5.11)
RDW: 13.6 % (ref 11.5–15.5)
WBC: 5.6 10*3/uL (ref 4.0–10.5)

## 2018-03-22 LAB — PROTIME-INR
INR: 3.21
Prothrombin Time: 32.6 seconds — ABNORMAL HIGH (ref 11.4–15.2)

## 2018-03-22 NOTE — Progress Notes (Signed)
Physical Therapy Treatment Patient Details Name: [name] MRN: [id number] DOB: 03-28-1935 Today's Date: 03/22/2018    History of Present Illness [name] is an 83yo female who comes to [facility] on 9/28 c dyspnea and Lt CP. Pt also arrives with INR> 10, quesitonable for poor meds compliance at home.  PMH: diastolic CHF, PE, venous stasis dermatitis, dysphagia, 10 year Parkinsonism without medical management per pt preference. At baseline, pt AMB short distances in home with aides, is dependent for ADL, has severe cervical dystonia.     PT Comments    Patient demonstrates increased tolerance for functional activity and able to sit up at bedside and transfer to [facility] with Max 2 person assist.  Patient unable to use RW due to weakness and required BUE hand held assist to sit to stand and take 2-3 steps to transfer to [facility], stood for up to 4-5 minutes at a time while being wiped by nursing staff.  Patient requires frequent verbal cueing for safety and put back to bed after therapy with nursing staff present in room.  Patient will benefit from continued physical therapy in hospital and recommended venue below to increase strength, balance, endurance for safe ADLs and gait.   Follow Up Recommendations  SNF;Supervision/Assistance - 24 hour;Supervision for mobility/OOB     Equipment Recommendations  None recommended by PT    Recommendations for Other Services       Precautions / Restrictions Precautions Precautions: Fall Restrictions Weight Bearing Restrictions: No    Mobility  Bed Mobility Overal bed mobility: Needs Assistance Bed Mobility: Supine to Sit;Sit to Supine     Supine to sit: Max assist Sit to supine: Max assist   General bed mobility comments: slow labored movement with some use of BUE  Transfers Overall transfer level: Needs assistance Equipment used: 1 person hand held assist Transfers: Sit to/from [employer] Transfers Sit to Stand: Max assist Stand pivot  transfers: Max assist       General transfer comment: required bilateral hand held assist with patient holding therapist wrist per patient's request  Ambulation/Gait Ambulation/Gait assistance: Max assist Gait Distance (Feet): 2 Feet Assistive device: 1 person hand held assist;2 person hand held assist Gait Pattern/deviations: Decreased step length - right;Decreased step length - left;Decreased stance time - right;Decreased stance time - left;Decreased stride length Gait velocity: slow   General Gait Details: limited to 2-3 unsteady steps with bilateral hand held assist due to patient leaning backwards   Stairs             Wheelchair Mobility    Modified Rankin (Stroke Patients Only)       Balance Overall balance assessment: Needs assistance Sitting-balance support: Feet supported;No upper extremity supported Sitting balance-Leahy Scale: Fair     Standing balance support: During functional activity;Bilateral upper extremity supported Standing balance-Leahy Scale: Poor Standing balance comment: with bilateral hand held assist                            Cognition Arousal/Alertness: Awake/alert Behavior During Therapy: WFL for tasks assessed/performed Overall Cognitive Status: Within Functional Limits for tasks assessed                                        Exercises      General Comments        Pertinent Vitals/Pain Pain Assessment: No/denies pain  Home Living                      Prior Function            PT Goals (current goals can now be found in the care plan section) Acute Rehab PT Goals Patient Stated Goal: return home with home aides to assist PT Goal Formulation: With patient/family Time For Goal Achievement: 03/28/18 Potential to Achieve Goals: Fair Progress towards PT goals: Progressing toward goals    Frequency    Min 3X/week      PT Plan Current plan remains appropriate     Co-evaluation              AM-PAC PT "6 Clicks" Daily Activity  Outcome Measure  Difficulty turning over in bed (including adjusting bedclothes, sheets and blankets)?: A Lot Difficulty moving from lying on back to sitting on the side of the bed? : A Lot Difficulty sitting down on and standing up from a chair with arms (e.g., wheelchair, bedside commode, etc,.)?: A Lot Help needed moving to and from a bed to chair (including a wheelchair)?: A Lot Help needed walking in hospital room?: Total Help needed climbing 3-5 steps with a railing? : Total 6 Click Score: 10    End of Session   Activity Tolerance: Patient tolerated treatment well;Patient limited by fatigue Patient left: in bed;with call bell/phone within reach;with nursing/sitter in room Nurse Communication: Mobility status PT Visit Diagnosis: Unsteadiness on feet (R26.81);Other abnormalities of gait and mobility (R26.89);Muscle weakness (generalized) (M62.81)     Time: 7829-5621 PT Time Calculation (min) (ACUTE ONLY): 43 min  Charges:  $Therapeutic Activity: 38-52 mins                     3:04 PM, 03/22/18 [name], MPT Physical Therapist with [facility] [phone] office [phone] mobile phone

## 2018-03-22 NOTE — Progress Notes (Signed)
Pt discharged home today per Dr. Arbutus Leas. Pt's IV site D/C'd and WDL. Pt's VSS. Pt provided with home medication list, discharge instructions and Advanced Directive Packet per pt request. Pt verbalized understanding. Pt is currently awaiting EMS transport.

## 2018-03-22 NOTE — Consult Note (Signed)
Consultation Note Date: 03/22/2018   Patient Name: [name]  DOB: 11/10/1934  MRN: [id number]  Age / Sex: 83 y.o., female  PCP: [name], MD Referring Physician: Catarina Hartshorn, MD  Reason for Consultation: Establishing goals of care  HPI/Patient Profile: 83 y.o. female  with past medical history of Parkinson's disease, history of PE, coagulopathy, venous stasis, CHF, 3 hospitalizations in 6 months admitted on 03/19/2018 with coagulopathy, dyspnea/acute bronchitis.   Clinical Assessment and Goals of Care: Ms. Taussig is sitting up in bed.  She is talking with hospitalist, Dr. Arbutus Leas.  He shares that she is scheduled to see Dr. Margo Aye tomorrow.  He leaves the room so that she and I may have conversations related to goals of care. We discuss palliative medicine, as she has asked me four times who I am and who I work for.  She goes further to ask if I work for [employer].  I share that I do, showing her my badge.  Ms. Trent tells me that she does not have time for "social talk", she is ready to get home.  She tells me that she will go home via ambulance, and will call someone to meet her at home.  I asked if this is the person who will help her get to the doctor's office tomorrow.  She tells me "if I decide to go".  She tells me that she is still considering, and will "talk to Dr. Margo Aye tomorrow". We talked about healthcare power of attorney, see below. We talked about CODE STATUS, see below. Conversation with social work related to disposition/patient needs. Conversation with hospitalist related to disposition/patient needs.  HCPOA NEXT OF KIN -we discussed healthcare power of attorney in as much detail as Ms. Diesing will allow.  She tells me that she will "have to consider" her choices.  I share with her that, as it now stands, her sister listed in chart, "Jerilee Hoh" would be her surrogate decision-maker as she has no  other contacts listed.   SUMMARY OF RECOMMENDATIONS   Home with the benefits of home health. Ms. Oshiro is to follow-up with PCP, Dr. Margo Aye, tomorrow 10/2 at 11 AM.  She tells me that she can arrange transportation. Continue CODE STATUS discussions.  Code Status/Advance Care Planning:  Full code -we talked about the concept of "treat the treatable".  We talked about DNR, what I like to call and "allow a natural death".  Ms. Jimmey Ralph continues to evade discussion by saying that she would "have to consider this".  She does however ask for some paperwork.  [facility] POA/AD paperwork is provided to nursing staff to be given upon discharge.  Symptom Management:   Per hospitalist, no additional needs at this time.  Palliative Prophylaxis:   none at this time  Additional Recommendations (Limitations, Scope, Preferences):  Full Scope Treatment  Psycho-social/Spiritual:   Desire for further Chaplaincy support:no  Additional Recommendations: Caregiving  Support/Resources and Education on Hospice  Prognosis:   Unable to determine, based on outcomes.  6  months or less would not be surprising based on frailty, poor functional status, 3 hospitalizations in 6 months, heart failure, suspect malnutrition.  Discharge Planning: Home with Home Health      Primary Diagnoses: Present on Admission: . Coagulopathy (HCC) . Pulmonary embolism (HCC) . Supratherapeutic INR . Chronic diastolic congestive heart failure (HCC)   I have reviewed the medical record, interviewed the patient and family, and examined the patient. The following aspects are pertinent.  Past Medical History:  Diagnosis Date  . CHF (congestive heart failure) (HCC)   . History of pulmonary embolism   . Parkinson's disease (HCC)   . Venous stasis    Social History   Socioeconomic History  . Marital status: Single    Spouse name: Not on file  . Number of children: Not on file  . Years of education: Not on file  . Highest  education level: Not on file  Occupational History  . Not on file  Social Needs  . Financial resource strain: Not on file  . Food insecurity:    Worry: Not on file    Inability: Not on file  . Transportation needs:    Medical: Not on file    Non-medical: Not on file  Tobacco Use  . Smoking status: Never Smoker  . Smokeless tobacco: Never Used  Substance and Sexual Activity  . Alcohol use: No  . Drug use: No  . Sexual activity: Not on file  Lifestyle  . Physical activity:    Days per week: Not on file    Minutes per session: Not on file  . Stress: Not on file  Relationships  . Social connections:    Talks on phone: Not on file    Gets together: Not on file    Attends religious service: Not on file    Active member of club or organization: Not on file    Attends meetings of clubs or organizations: Not on file    Relationship status: Not on file  Other Topics Concern  . Not on file  Social History Narrative  . Not on file   Family History  Problem Relation Age of Onset  . Heart disease Mother        90s   Scheduled Meds: . sodium chloride   Intravenous Once  . magic mouthwash  10 mL Oral QID  . Warfarin - Pharmacist Dosing Inpatient   Does not apply q1800   Continuous Infusions: PRN Meds:.acetaminophen **OR** acetaminophen, ondansetron **OR** ondansetron (ZOFRAN) IV Medications Prior to Admission:  Prior to Admission medications   Medication Sig Start Date End Date Taking? Authorizing Provider  warfarin (COUMADIN) 2 MG tablet Take 1 tablet (2 mg total) by mouth daily at 6 PM. Take one tab daily or Take as directed by anticoagulation clinic. Start after 9/11 if INR<3 02/28/18  Yes Memon, Durward Mallard, MD  diltiazem (CARDIZEM CD) 120 MG 24 hr capsule Take 1 capsule (120 mg total) by mouth daily. Patient not taking: Reported on 03/19/2018 03/01/18   Erick Blinks, MD  furosemide (LASIX) 20 MG tablet Take 1 tablet (20 mg total) by mouth daily. Patient not taking: Reported on  03/19/2018 03/01/18   Erick Blinks, MD  gabapentin (NEURONTIN) 100 MG capsule Take 1 capsule (100 mg total) by mouth 3 (three) times daily. Patient not taking: Reported on 03/19/2018 02/28/18   Erick Blinks, MD   Allergies  Allergen Reactions  . Contrast Media [Iodinated Diagnostic Agents] Anaphylaxis and Shortness Of Breath  . Vancomycin Shortness  Of Breath    REACTION: SOB  . Vioxx [Rofecoxib]     REACTION: irregular heartbeat   Review of Systems  Unable to perform ROS: Age    Physical Exam  Constitutional: No distress.  Appears acutely/chronically ill, frail.  Makes and keeps eye contact when able due to contraction of neck  HENT:  Head: Atraumatic.  Cardiovascular: Normal rate.  Pulmonary/Chest: Effort normal. No respiratory distress.  Musculoskeletal: She exhibits no edema.  Neurological: She is alert.  Declines to answer questions  Skin: Skin is warm and dry.  Psychiatric:  Somewhat argumentative, uncooperative demeanor  Nursing note and vitals reviewed.   Vital Signs: BP 135/64   Pulse 64   Temp 98 F (36.7 C) (Oral)   Resp 18   Ht 5\' 4"  (1.626 m)   Wt 44.7 kg Comment: Rn was told about the wt   SpO2 98%   BMI 16.92 kg/m  Pain Scale: 0-10 POSS *See Group Information*: 1-Acceptable,Awake and alert Pain Score: 0-No pain   SpO2: SpO2: 98 % O2 Device:SpO2: 98 % O2 Flow Rate: .   IO: Intake/output summary:   Intake/Output Summary (Last 24 hours) at 03/22/2018 1431 Last data filed at 03/21/2018 1800 Gross per 24 hour  Intake 120 ml  Output -  Net 120 ml    LBM: Last BM Date: 03/18/18 Baseline Weight: Weight: 68 kg Most recent weight: Weight: 44.7 kg(Rn was told about the wt )     Palliative Assessment/Data:   Flowsheet Rows     Most Recent Value  Intake Tab  Referral Department  Hospitalist  Unit at Time of Referral  Cardiac/Telemetry Unit  Palliative Care Primary Diagnosis  Other (Comment) [coagulopathy]  Date Notified  03/21/18  Palliative  Care Type  New Palliative care  Reason for referral  Clarify Goals of Care  Date of Admission  03/19/18  Date first seen by Palliative Care  03/22/18  # of days Palliative referral response time  1 Day(s)  # of days IP prior to Palliative referral  2  Clinical Assessment  Palliative Performance Scale Score  40%  Pain Max last 24 hours  Not able to report  Pain Min Last 24 hours  Not able to report  Dyspnea Max Last 24 Hours  Not able to report  Dyspnea Min Last 24 hours  Not able to report  Psychosocial & Spiritual Assessment  Palliative Care Outcomes  Patient/Family meeting held?  Yes  Who was at the meeting?  patient at bedside      Time In: 1410 Time Out: 1450 Time Total: 40 minutes Greater than 50%  of this time was spent counseling and coordinating care related to the above assessment and plan.  Signed by: Katheran Awe, NP   Please contact Palliative Medicine Team phone at [phone] for questions and concerns.  For individual provider: See Loretha Stapler

## 2018-03-22 NOTE — Clinical Social Work Note (Signed)
Late entry for 03/21/18  LCSW met with patient and discussed SNF options. Paitent lives alone and has "private help." Patient began to cry loudly for a short period. When asked why she was crying, patient stated that she didn't want to go to one of those nursing home. LCSW discussed that skilled PT was provided in a SNF for short term rehab purposes only.  Patient then stated "well I'm not going to one of those places."   Patient's sister and brother in law then entered the room and patient asked LCSW to leave despite guest advising that they would wait outside.   Raesean Bartoletti, Clydene Pugh, LCSW

## 2018-03-22 NOTE — Progress Notes (Signed)
ANTICOAGULATION CONSULT NOTE  Pharmacy Consult for Coumadin Indication: pulmonary embolus/VTE tx  Allergies  Allergen Reactions  . Contrast Media [Iodinated Diagnostic Agents] Anaphylaxis and Shortness Of Breath  . Vancomycin Shortness Of Breath    REACTION: SOB  . Vioxx [Rofecoxib]     REACTION: irregular heartbeat    Patient Measurements: Height: 5\' 4"  (162.6 cm) Weight: 98 lb 8.7 oz (44.7 kg)(Rn was told about the wt ) IBW/kg (Calculated) : 54.7  Vital Signs: Temp: 98 F (36.7 C) (10/01 0652) Temp Source: Oral (10/01 0652) BP: 135/64 (10/01 0652) Pulse Rate: 64 (10/01 0652)  Labs: Recent Labs    03/19/18 1211 03/19/18 1743 03/19/18 2216 03/20/18 0716 03/21/18 0613 03/22/18 0500  HGB 13.5  --   --  13.8 12.6 12.9  HCT 42.3  --   --  42.5 40.1 41.3  PLT 181  --   --  182 176 190  LABPROT >90.0*  --   --  85.2* 24.4* 32.6*  INR >10.00*  --   --  >10.00* 2.22 3.21  CREATININE 0.61  --   --  0.81 0.67  --   TROPONINI <0.03 <0.03 <0.03  --   --   --     Estimated Creatinine Clearance: 38.9 mL/min (by C-G formula based on SCr of 0.67 mg/dL).    Assessment: Pharmacy consulted to dose warfarin for this 83 yo female  on chronic coumadin for previous PE. Upon    admission,  her  INR was  over 10,  so she was given vitamin K 10 mg PO on   9/28 and 9/29.     Dr. Arbutus Leas discussed other anti-coagulant options with patient, as proper monitoring for this patient seems unattainable, but patient refuses any other option.   One dose of warfarin 2mg  raised her INR by almost one whole point, from 2.22 to 3.21, so warfarin will be held today.     9/23 BCx: x 2 bottles Enterococcus faecalis 9/25 BCx: E. Faecalis (sens to amp/vanc) 9/29 BCx2= NG x2 days 9/24: MRSA PCR: negative  Goal of Therapy:  INR 2-3 Monitor platelets by anticoagulation protocol: Yes   Plan:  Hold warfarin today. When and if re-started, recommend starting at a 1mg  dose and monitoring INR daily until goal  range has been attained for at least a couple of days. Then, monitor INR weekly x1 month. If therapeutic all this time, INR monitoring can be moved to monthly. PT- INR daily Monitor for s/s of bleeding  Tama High, Pharm. D. Clinical Pharmacist 03/22/2018 12:07 PM

## 2018-03-22 NOTE — Care Management Note (Signed)
Case Management Note  Patient Details  Name: MERRIAM BRANDNER MRN: [id number] Date of Birth: August 28, 1934  Subjective/Objective:  Admitted with supra therapeutic INR. From home with caregivers. Pt is active with [facility].  Pt is not interested in stopping coumadin or transitioning to another NOAC. CM has contacted PCP office. Pt has been non-compliant with appointments, missing 4 or 5 since July. A new Rx for coumadin was sent to pharmacy at the beginning of Sept. CM made f/u appointment for tomorrow.                 Action/Plan: DC home today with resumption of HH services. Brad, [facility] rep, aware of DC today.    Expected Discharge Date:  03/22/18               Expected Discharge Plan:  Home w Home Health Services  In-House Referral:  Clinical Social Work, Hospice / Palliative Care  Discharge planning Services  CM Consult  Post Acute Care Choice:  Home Health, Resumption of Svcs/PTA Provider Choice offered to:  Patient  HH Arranged:  RN, PT [facility] Agency:  [facility]  Status of Service:  Completed, signed off  If discussed at Long Length of Stay Meetings, dates discussed:    Additional Comments:  Malcolm Metro, RN 03/22/2018, 4:12 PM

## 2018-03-23 LAB — LUPUS ANTICOAGULANT
DRVVT: 103.1 s — AB (ref 0.0–47.0)
PTT Lupus Anticoagulant: 144.4 s — ABNORMAL HIGH (ref 0.0–51.9)
Thrombin Time: 18.6 s (ref 0.0–23.0)
dPT Confirm Ratio: UNDETERMINED Ratio
dPT: >200 s — ABNORMAL HIGH (ref 0.0–55.0)

## 2018-03-23 LAB — DRVVT MIX: dRVVT Mix: 41.3 s (ref 0.0–47.0)

## 2018-03-23 LAB — PTT-LA MIX: PTT-LA MIX: 51.5 s — AB (ref 0.0–48.9)

## 2018-03-23 LAB — HEXAGONAL PHASE PHOSPHOLIPID: Hexagonal Phase Phospholipid: 2 s (ref 0–11)

## 2018-03-24 ENCOUNTER — Other Ambulatory Visit (HOSPITAL_COMMUNITY)
Admission: AD | Admit: 2018-03-24 | Discharge: 2018-03-24 | Disposition: A | Discharge status: Home / Self Care | Payer: Medicare Other | Source: Skilled Nursing Facility | Attending: Internal Medicine | Admitting: Internal Medicine

## 2018-03-24 DIAGNOSIS — Z5181 Encounter for therapeutic drug level monitoring: Secondary | ICD-10-CM | POA: Insufficient documentation

## 2018-03-24 LAB — PROTHROMBIN GENE MUTATION

## 2018-03-24 LAB — PROTIME-INR
INR: 7.12
PROTHROMBIN TIME: 60.7 s — AB (ref 11.4–15.2)

## 2018-03-24 LAB — FACTOR 5 LEIDEN

## 2018-03-25 ENCOUNTER — Other Ambulatory Visit (HOSPITAL_COMMUNITY)
Admission: RE | Admit: 2018-03-25 | Discharge: 2018-03-25 | Disposition: A | Discharge status: Home / Self Care | Payer: Medicare Other | Source: Other Acute Inpatient Hospital | Attending: Internal Medicine | Admitting: Internal Medicine

## 2018-03-25 DIAGNOSIS — Z5181 Encounter for therapeutic drug level monitoring: Secondary | ICD-10-CM | POA: Diagnosis present

## 2018-03-25 LAB — PROTIME-INR
INR: 7.55 — AB
PROTHROMBIN TIME: 63.6 s — AB (ref 11.4–15.2)

## 2018-03-27 ENCOUNTER — Other Ambulatory Visit (HOSPITAL_COMMUNITY)
Admission: RE | Admit: 2018-03-27 | Discharge: 2018-03-27 | Disposition: A | Discharge status: Home / Self Care | Payer: Medicare Other | Source: Other Acute Inpatient Hospital | Attending: Internal Medicine | Admitting: Internal Medicine

## 2018-03-27 DIAGNOSIS — Z5181 Encounter for therapeutic drug level monitoring: Secondary | ICD-10-CM | POA: Diagnosis present

## 2018-03-27 LAB — PROTIME-INR
INR: 4.86 — AB
Prothrombin Time: 45.1 seconds — ABNORMAL HIGH (ref 11.4–15.2)

## 2018-03-29 ENCOUNTER — Other Ambulatory Visit (HOSPITAL_COMMUNITY)
Admission: RE | Admit: 2018-03-29 | Discharge: 2018-03-29 | Disposition: A | Discharge status: Home / Self Care | Payer: Medicare Other | Source: Other Acute Inpatient Hospital | Attending: Internal Medicine | Admitting: Internal Medicine

## 2018-03-29 DIAGNOSIS — Z5181 Encounter for therapeutic drug level monitoring: Secondary | ICD-10-CM | POA: Diagnosis present

## 2018-03-29 LAB — PROTIME-INR
INR: 3.9
Prothrombin Time: 37.9 seconds — ABNORMAL HIGH (ref 11.4–15.2)

## 2018-04-08 ENCOUNTER — Emergency Department (HOSPITAL_COMMUNITY): Payer: Medicare Other

## 2018-04-08 ENCOUNTER — Other Ambulatory Visit: Payer: Self-pay

## 2018-04-08 ENCOUNTER — Encounter (HOSPITAL_COMMUNITY): Payer: Self-pay | Admitting: Emergency Medicine

## 2018-04-08 ENCOUNTER — Emergency Department (HOSPITAL_COMMUNITY)
Admission: EM | Admit: 2018-04-08 | Discharge: 2018-04-08 | Disposition: A | Discharge status: Home / Self Care | Payer: Medicare Other | Attending: Emergency Medicine | Admitting: Emergency Medicine

## 2018-04-08 DIAGNOSIS — R600 Localized edema: Secondary | ICD-10-CM | POA: Insufficient documentation

## 2018-04-08 DIAGNOSIS — I5032 Chronic diastolic (congestive) heart failure: Secondary | ICD-10-CM | POA: Diagnosis not present

## 2018-04-08 DIAGNOSIS — Z86718 Personal history of other venous thrombosis and embolism: Secondary | ICD-10-CM | POA: Insufficient documentation

## 2018-04-08 DIAGNOSIS — G20 Parkinson's disease: Secondary | ICD-10-CM | POA: Insufficient documentation

## 2018-04-08 DIAGNOSIS — R609 Edema, unspecified: Secondary | ICD-10-CM

## 2018-04-08 LAB — CBC WITH DIFFERENTIAL/PLATELET
ABS IMMATURE GRANULOCYTES: 0.01 10*3/uL (ref 0.00–0.07)
BASOS ABS: 0 10*3/uL (ref 0.0–0.1)
BASOS PCT: 0 %
EOS ABS: 0.3 10*3/uL (ref 0.0–0.5)
Eosinophils Relative: 5 %
HCT: 40.1 % (ref 36.0–46.0)
Hemoglobin: 12.3 g/dL (ref 12.0–15.0)
IMMATURE GRANULOCYTES: 0 %
Lymphocytes Relative: 22 %
Lymphs Abs: 1.3 10*3/uL (ref 0.7–4.0)
MCH: 27.3 pg (ref 26.0–34.0)
MCHC: 30.7 g/dL (ref 30.0–36.0)
MCV: 89.1 fL (ref 80.0–100.0)
MONOS PCT: 9 %
Monocytes Absolute: 0.5 10*3/uL (ref 0.1–1.0)
NEUTROS ABS: 3.7 10*3/uL (ref 1.7–7.7)
NEUTROS PCT: 64 %
NRBC: 0 % (ref 0.0–0.2)
Platelets: 199 10*3/uL (ref 150–400)
RBC: 4.5 MIL/uL (ref 3.87–5.11)
RDW: 13.7 % (ref 11.5–15.5)
WBC: 5.8 10*3/uL (ref 4.0–10.5)

## 2018-04-08 LAB — BASIC METABOLIC PANEL
Anion gap: 7 (ref 5–15)
BUN: 11 mg/dL (ref 8–23)
CALCIUM: 8.3 mg/dL — AB (ref 8.9–10.3)
CO2: 25 mmol/L (ref 22–32)
Chloride: 106 mmol/L (ref 98–111)
Creatinine, Ser: 0.59 mg/dL (ref 0.44–1.00)
GFR calc Af Amer: >60 mL/min (ref >60)
GLUCOSE: 83 mg/dL (ref 70–99)
POTASSIUM: 3.3 mmol/L — AB (ref 3.5–5.1)
Sodium: 138 mmol/L (ref 135–145)

## 2018-04-08 LAB — BRAIN NATRIURETIC PEPTIDE: B Natriuretic Peptide: 180 pg/mL — ABNORMAL HIGH (ref 0.0–100.0)

## 2018-04-08 LAB — PROTIME-INR
INR: 1.58
Prothrombin Time: 18.6 seconds — ABNORMAL HIGH (ref 11.4–15.2)

## 2018-04-08 LAB — TROPONIN I: Troponin I: <0.03 ng/mL (ref <0.03)

## 2018-04-08 MED ORDER — POTASSIUM CHLORIDE CRYS ER 20 MEQ PO TBCR
20.0000 meq | EXTENDED_RELEASE_TABLET | Freq: Once | ORAL | Status: DC
  Filled 2018-04-08: qty 1

## 2018-04-08 MED ORDER — FUROSEMIDE 40 MG PO TABS
20.0000 mg | ORAL_TABLET | Freq: Once | ORAL | Status: DC
  Filled 2018-04-08: qty 1

## 2018-04-08 MED ORDER — POTASSIUM CHLORIDE CRYS ER 20 MEQ PO TBCR: 20.0000 meq | EXTENDED_RELEASE_TABLET | Freq: Every day | ORAL | 0 refills | Status: DC

## 2018-04-08 MED ORDER — FUROSEMIDE 20 MG PO TABS: 20.0000 mg | ORAL_TABLET | Freq: Every day | ORAL | 0 refills | Status: DC

## 2018-04-08 NOTE — Discharge Instructions (Addendum)
You were seen in the emergency department for increased redness and swelling of your lower legs.  There is no evidence that this was infection but is likely excess fluid in your legs.  We are recommending that you take furosemide to help take some of the fluid off your legs.  We are also prescribing you some potassium as the fluid pill will make you lose potassium.  Please follow-up with your doctor as soon as possible.  Return if any concerns.

## 2018-04-08 NOTE — ED Provider Notes (Signed)
[facility] EMERGENCY DEPARTMENT Provider Note   CSN: [id number] Arrival date & time: 04/08/18  1347     History   Chief Complaint Chief Complaint  Patient presents with  . Leg Swelling    HPI [name] is a 83 y.o. female.  She is presenting to the emergency department by ambulance for multiple complaints.  She said her legs have been progressively swelling over the last few days and they feel numb at times.  She is also felt a fluttering in her chest and feels a little more short of breath.  She is also having pain under her left breast and into her left shoulder blade.  That began today.  It seems to have resolved now.  She also states she has more tremor today than usual.  No fevers no chills no cough no abdominal pain no vomiting no diarrhea.  The history is provided by the patient.  Shortness of Breath  This is a recurrent problem. The problem occurs frequently.The current episode started 2 days ago. The problem has been gradually worsening. Associated symptoms include chest pain and leg swelling. Pertinent negatives include no fever, no headaches, no rhinorrhea, no sore throat, no neck pain, no cough, no sputum production, no hemoptysis, no wheezing, no syncope, no vomiting, no abdominal pain and no rash. It is unknown what precipitated the problem. She has tried nothing for the symptoms. The treatment provided no relief.    Past Medical History:  Diagnosis Date  . CHF (congestive heart failure) (HCC)   . History of pulmonary embolism   . Parkinson's disease (HCC)   . Venous stasis     Patient Active Problem List   Diagnosis Date Noted  . Goals of care, counseling/discussion   . Palliative care by specialist   . DNR (do not resuscitate) discussion   . Coagulopathy (HCC) 03/19/2018  . Elevated INR   . Supratherapeutic INR 02/24/2018  . Physical debility 02/24/2018  . Folliculitis of perineum 12/15/2017  . Streptococcal bacteremia 07/09/2017  . Cellulitis of  lower extremity   . Bacteremia due to Gram-positive bacteria 07/06/2017  . Cellulitis and abscess of left leg 07/05/2017  . Vulvar abscess 07/05/2017  . Infected cyst of Bartholin's gland duct 07/05/2017  . Weakness 12/21/2014  . Leg weakness 12/20/2014  . Lower extremity weakness 12/20/2014  . Precordial pain 12/20/2014  . Diarrhea   . Chest pain 11/28/2014  . Pressure ulcer, heel 11/28/2014  . AKI (acute kidney injury) (HCC) 11/28/2014  . Chronic diastolic congestive heart failure (HCC) 11/28/2014  . Parkinson's disease (HCC) 11/28/2014  . Pain in the chest   . Encounter for therapeutic drug monitoring 09/15/2013  . Long term (current) use of anticoagulants 09/22/2010  . Pulmonary embolism (HCC) 08/25/2010  . Generalized weakness 08/16/2009  . LEG EDEMA 08/16/2009  . Chronic diastolic CHF (congestive heart failure) (HCC) 08/16/2009  . Venous (peripheral) insufficiency 11/15/2008  . PULMONARY EMBOLISM, HX OF 11/15/2008    History reviewed. No pertinent surgical history.   OB History   None      Home Medications    Prior to Admission medications   Not on File    Family History Family History  Problem Relation Age of Onset  . Heart disease Mother        55s    Social History Social History   Tobacco Use  . Smoking status: Never Smoker  . Smokeless tobacco: Never Used  Substance Use Topics  . Alcohol use: No  .  Drug use: No     Allergies   Contrast media [iodinated diagnostic agents]; Vancomycin; and Vioxx [rofecoxib]   Review of Systems Review of Systems  Constitutional: Negative for fever.  HENT: Negative for rhinorrhea and sore throat.   Eyes: Negative for visual disturbance.  Respiratory: Positive for shortness of breath. Negative for cough, hemoptysis, sputum production and wheezing.   Cardiovascular: Positive for chest pain and leg swelling. Negative for syncope.  Gastrointestinal: Negative for abdominal pain and vomiting.  Genitourinary:  Negative for dysuria.  Musculoskeletal: Negative for neck pain.  Skin: Negative for rash.  Neurological: Negative for headaches.     Physical Exam Updated Vital Signs BP (!) 159/64   Pulse 81   Temp 99.2 F (37.3 C) (Rectal)   Resp 20   Ht 5\' 4"  (1.626 m)   Wt 44.7 kg   SpO2 98%   BMI 16.92 kg/m   Physical Exam  Constitutional: She appears well-developed and well-nourished.  HENT:  Head: Normocephalic and atraumatic.  Right Ear: External ear normal.  Left Ear: External ear normal.  Mouth/Throat: Oropharynx is clear and moist.  Eyes: Conjunctivae are normal.  Neck: No tracheal deviation present.  Cardiovascular: Normal rate, regular rhythm, normal heart sounds and intact distal pulses.  Pulmonary/Chest: Effort normal. No stridor. She has no wheezes.  Abdominal: Soft. She exhibits no mass. There is no tenderness. There is no guarding.  Musculoskeletal: She exhibits edema.  Lateral lower extremity swelling with some erythema and warmth.  Neurological: She is alert. GCS eye subscore is 4. GCS verbal subscore is 5. GCS motor subscore is 6.  Patient has a fine tremor mostly in her right upper extremity.  She appears to have equal strength in all 4 extremities.  Skin: Skin is warm and dry.  Psychiatric: She has a normal mood and affect.  Nursing note and vitals reviewed.    ED Treatments / Results  Labs (all labs ordered are listed, but only abnormal results are displayed) Labs Reviewed  BASIC METABOLIC PANEL - Abnormal; Notable for the following components:      Result Value   Potassium 3.3 (*)    Calcium 8.3 (*)    All other components within normal limits  BRAIN NATRIURETIC PEPTIDE - Abnormal; Notable for the following components:   B Natriuretic Peptide 180.0 (*)    All other components within normal limits  PROTIME-INR - Abnormal; Notable for the following components:   Prothrombin Time 18.6 (*)    All other components within normal limits  TROPONIN I  CBC WITH  DIFFERENTIAL/PLATELET    EKG None  Radiology Dg Chest Portable 1 View  Result Date: 04/08/2018 CLINICAL DATA:  Lower extremity edema, 2 days duration. EXAM: PORTABLE CHEST 1 VIEW COMPARISON:  03/19/2018 FINDINGS: Mild chronic cardiac enlargement. Aortic atherosclerosis. There may be venous hypertension but there is no frank edema. No sign of infiltrate, effusion or collapse. IMPRESSION: Possible venous hypertension, but no frank edema or effusions. Electronically Signed   By: Paulina Fusi M.D.   On: 04/08/2018 16:02    Procedures Procedures (including critical care time)  Medications Ordered in ED Medications - No data to display   Initial Impression / Assessment and Plan / ED Course  I have reviewed the triage vital signs and the nursing notes.  Pertinent labs & imaging results that were available during my care of the patient were reviewed by me and considered in my medical decision making (see chart for details).  Clinical Course as of Apr 09 1249  Fri Apr 08, 2018  1505 Viewed the last admission from the patient about 3 weeks ago.  It sounds very similar to today with some shortness of breath some leg swelling.  They comment upon her poor social situation.    [MB]  1716 I reviewed the patient's results with her.  We will give her some oral repletion of potassium and a dose of the furosemide that she refuses to take.  I do not see a medical reason for admitting the patient.  She says she is scared to go home because she is alone.  It looks like the last admission they offered her rehab which she declined.   [MB]  1803 Per the nurse patient has refused her potassium and Lasix.   [MB]    Clinical Course User Index [MB] Terrilee Files, MD     Final Clinical Impressions(s) / ED Diagnoses   Final diagnoses:  Peripheral edema    ED Discharge Orders         Ordered    furosemide (LASIX) 20 MG tablet  Daily     04/08/18 1836    potassium chloride SA (K-DUR,KLOR-CON) 20  MEQ tablet  Daily     04/08/18 1836           Terrilee Files, MD 04/09/18 1251

## 2018-04-08 NOTE — ED Notes (Signed)
[facility] here for transport.

## 2018-04-08 NOTE — ED Triage Notes (Signed)
Legs swelling for 2 days

## 2018-04-27 ENCOUNTER — Other Ambulatory Visit (HOSPITAL_COMMUNITY)
Admission: RE | Admit: 2018-04-27 | Discharge: 2018-04-27 | Disposition: A | Discharge status: Home / Self Care | Payer: Medicare Other | Source: Other Acute Inpatient Hospital | Attending: Internal Medicine | Admitting: Internal Medicine

## 2018-04-27 DIAGNOSIS — Z5181 Encounter for therapeutic drug level monitoring: Secondary | ICD-10-CM | POA: Diagnosis present

## 2018-04-27 LAB — PROTIME-INR
INR: >10
Prothrombin Time: >90 seconds — ABNORMAL HIGH (ref 11.4–15.2)

## 2018-04-30 ENCOUNTER — Observation Stay (HOSPITAL_COMMUNITY)
Admission: EM | Admit: 2018-04-30 | Discharge: 2018-05-02 | Disposition: A | Discharge status: Home / Self Care | Payer: Medicare Other | Attending: Internal Medicine | Admitting: Internal Medicine

## 2018-04-30 ENCOUNTER — Other Ambulatory Visit: Payer: Self-pay

## 2018-04-30 ENCOUNTER — Encounter (HOSPITAL_COMMUNITY): Payer: Self-pay

## 2018-04-30 ENCOUNTER — Emergency Department (HOSPITAL_COMMUNITY): Payer: Medicare Other

## 2018-04-30 DIAGNOSIS — K921 Melena: Secondary | ICD-10-CM | POA: Insufficient documentation

## 2018-04-30 DIAGNOSIS — R079 Chest pain, unspecified: Secondary | ICD-10-CM

## 2018-04-30 DIAGNOSIS — R0789 Other chest pain: Secondary | ICD-10-CM | POA: Diagnosis present

## 2018-04-30 DIAGNOSIS — Z7901 Long term (current) use of anticoagulants: Secondary | ICD-10-CM | POA: Diagnosis not present

## 2018-04-30 DIAGNOSIS — Z86711 Personal history of pulmonary embolism: Secondary | ICD-10-CM | POA: Diagnosis not present

## 2018-04-30 DIAGNOSIS — G20 Parkinson's disease: Secondary | ICD-10-CM

## 2018-04-30 DIAGNOSIS — R791 Abnormal coagulation profile: Secondary | ICD-10-CM | POA: Diagnosis not present

## 2018-04-30 DIAGNOSIS — Z86718 Personal history of other venous thrombosis and embolism: Secondary | ICD-10-CM

## 2018-04-30 DIAGNOSIS — I5032 Chronic diastolic (congestive) heart failure: Secondary | ICD-10-CM | POA: Insufficient documentation

## 2018-04-30 DIAGNOSIS — Z79899 Other long term (current) drug therapy: Secondary | ICD-10-CM | POA: Insufficient documentation

## 2018-04-30 DIAGNOSIS — D689 Coagulation defect, unspecified: Secondary | ICD-10-CM | POA: Insufficient documentation

## 2018-04-30 LAB — CBC
HEMATOCRIT: 41.5 % (ref 36.0–46.0)
Hemoglobin: 12.9 g/dL (ref 12.0–15.0)
MCH: 27.4 pg (ref 26.0–34.0)
MCHC: 31.1 g/dL (ref 30.0–36.0)
MCV: 88.1 fL (ref 80.0–100.0)
Platelets: 198 10*3/uL (ref 150–400)
RBC: 4.71 MIL/uL (ref 3.87–5.11)
RDW: 13.9 % (ref 11.5–15.5)
WBC: 6.4 10*3/uL (ref 4.0–10.5)
nRBC: 0 % (ref 0.0–0.2)

## 2018-04-30 LAB — PROTIME-INR: Prothrombin Time: 84.4 seconds — ABNORMAL HIGH (ref 11.4–15.2)

## 2018-04-30 LAB — BASIC METABOLIC PANEL
Anion gap: 8 (ref 5–15)
BUN: 18 mg/dL (ref 8–23)
CALCIUM: 8.7 mg/dL — AB (ref 8.9–10.3)
CHLORIDE: 109 mmol/L (ref 98–111)
CO2: 23 mmol/L (ref 22–32)
CREATININE: 0.59 mg/dL (ref 0.44–1.00)
GFR calc Af Amer: >60 mL/min (ref >60)
GFR calc non Af Amer: >60 mL/min (ref >60)
Glucose, Bld: 82 mg/dL (ref 70–99)
Potassium: 4.2 mmol/L (ref 3.5–5.1)
SODIUM: 140 mmol/L (ref 135–145)

## 2018-04-30 LAB — I-STAT TROPONIN, ED: TROPONIN I, POC: 0 ng/mL (ref 0.00–0.08)

## 2018-04-30 MED ORDER — ONDANSETRON HCL 4 MG PO TABS: 4.0000 mg | ORAL_TABLET | Freq: Four times a day (QID) | ORAL | Status: DC | PRN

## 2018-04-30 MED ORDER — TRAMADOL HCL 50 MG PO TABS: 50.0000 mg | ORAL_TABLET | Freq: Four times a day (QID) | ORAL | Status: DC | PRN

## 2018-04-30 MED ORDER — ACETAMINOPHEN 650 MG RE SUPP: 650.0000 mg | Freq: Four times a day (QID) | RECTAL | Status: DC | PRN

## 2018-04-30 MED ORDER — ONDANSETRON HCL 4 MG/2ML IJ SOLN: 4.0000 mg | Freq: Four times a day (QID) | INTRAMUSCULAR | Status: DC | PRN

## 2018-04-30 MED ORDER — ACETAMINOPHEN 325 MG PO TABS
650.0000 mg | ORAL_TABLET | Freq: Four times a day (QID) | ORAL | Status: DC | PRN
  Administered 2018-04-30 – 2018-05-01 (×2): 650 mg via ORAL
  Filled 2018-04-30 (×2): qty 2

## 2018-04-30 MED ORDER — SODIUM CHLORIDE 0.9 % IV SOLN: INTRAVENOUS | Status: DC

## 2018-04-30 MED ORDER — PHYTONADIONE 5 MG PO TABS
5.0000 mg | ORAL_TABLET | Freq: Once | ORAL | Status: AC
  Administered 2018-04-30: 5 mg via ORAL
  Filled 2018-04-30: qty 1

## 2018-04-30 MED ORDER — VITAMIN K1 10 MG/ML IJ SOLN: 10.0000 mg | Freq: Once | INTRAVENOUS | Status: DC

## 2018-04-30 NOTE — ED Provider Notes (Addendum)
[facility] EMERGENCY DEPARTMENT Provider Note   CSN: [id number] Arrival date & time: 04/30/18  1125     History   Chief Complaint Chief Complaint  Patient presents with  . Shoulder Pain    HPI [name] is a 83 y.o. female.  Chest tightness with radiation to the left shoulder starting in the middle of the night.  Symptoms have abated.  Patient also reports elevated INR recently.  She had been taking Coumadin 2.5 mg daily; however, she has taken no doses in the past 3 to 4 days.  Past medical history includes Parkinson's, CHF, pulmonary embolism (10 years ago).  Severity of symptoms is moderate.  Nothing makes symptoms better or worse.     Past Medical History:  Diagnosis Date  . CHF (congestive heart failure) (HCC)   . History of pulmonary embolism   . Parkinson's disease (HCC)   . Venous stasis     Patient Active Problem List   Diagnosis Date Noted  . Goals of care, counseling/discussion   . Palliative care by specialist   . DNR (do not resuscitate) discussion   . Coagulopathy (HCC) 03/19/2018  . Elevated INR   . Supratherapeutic INR 02/24/2018  . Physical debility 02/24/2018  . Folliculitis of perineum 12/15/2017  . Streptococcal bacteremia 07/09/2017  . Cellulitis of lower extremity   . Bacteremia due to Gram-positive bacteria 07/06/2017  . Cellulitis and abscess of left leg 07/05/2017  . Vulvar abscess 07/05/2017  . Infected cyst of Bartholin's gland duct 07/05/2017  . Weakness 12/21/2014  . Leg weakness 12/20/2014  . Lower extremity weakness 12/20/2014  . Precordial pain 12/20/2014  . Diarrhea   . Chest pain 11/28/2014  . Pressure ulcer, heel 11/28/2014  . AKI (acute kidney injury) (HCC) 11/28/2014  . Chronic diastolic congestive heart failure (HCC) 11/28/2014  . Parkinson's disease (HCC) 11/28/2014  . Pain in the chest   . Encounter for therapeutic drug monitoring 09/15/2013  . Long term (current) use of anticoagulants 09/22/2010  . Pulmonary  embolism (HCC) 08/25/2010  . Generalized weakness 08/16/2009  . LEG EDEMA 08/16/2009  . Chronic diastolic CHF (congestive heart failure) (HCC) 08/16/2009  . Venous (peripheral) insufficiency 11/15/2008  . PULMONARY EMBOLISM, HX OF 11/15/2008    History reviewed. No pertinent surgical history.   OB History   None      Home Medications    Prior to Admission medications   Medication Sig Start Date End Date Taking? Authorizing Provider  furosemide (LASIX) 20 MG tablet Take 1 tablet (20 mg total) by mouth daily. 04/08/18   Terrilee Files, MD  potassium chloride SA (K-DUR,KLOR-CON) 20 MEQ tablet Take 1 tablet (20 mEq total) by mouth daily. 04/08/18   Terrilee Files, MD    Family History Family History  Problem Relation Age of Onset  . Heart disease Mother        3s    Social History Social History   Tobacco Use  . Smoking status: Never Smoker  . Smokeless tobacco: Never Used  Substance Use Topics  . Alcohol use: No  . Drug use: No     Allergies   Contrast media [iodinated diagnostic agents]; Vancomycin; and Vioxx [rofecoxib]   Review of Systems Review of Systems  All other systems reviewed and are negative.    Physical Exam Updated Vital Signs BP (!) 156/80   Pulse (!) 101   Temp 98.2 F (36.8 C) (Oral)   Resp 15   Ht 5\' 3"  (1.6 m)  Wt 68 kg   SpO2 98%   BMI 26.57 kg/m   Physical Exam  Constitutional: She is oriented to person, place, and time.  Alert, unable to hold her head up (normal)  HENT:  Head: Normocephalic and atraumatic.  Eyes: Conjunctivae are normal.  Neck: Neck supple.  Cardiovascular: Normal rate and regular rhythm.  Pulmonary/Chest: Effort normal and breath sounds normal.  Abdominal: Soft. Bowel sounds are normal.  Musculoskeletal:  Unable  Neurological: She is alert and oriented to person, place, and time.  Skin: Skin is warm and dry.  Psychiatric: She has a normal mood and affect.  Nursing note and vitals  reviewed.    ED Treatments / Results  Labs (all labs ordered are listed, but only abnormal results are displayed) Labs Reviewed  BASIC METABOLIC PANEL - Abnormal; Notable for the following components:      Result Value   Calcium 8.7 (*)    All other components within normal limits  PROTIME-INR - Abnormal; Notable for the following components:   Prothrombin Time 84.4 (*)    INR >10.00 (*)    All other components within normal limits  CBC  I-STAT TROPONIN, ED    EKG EKG Interpretation  Date/Time:  Saturday April 30 2018 12:04:50 EST Ventricular Rate:  85 PR Interval:    QRS Duration: 134 QT Interval:  428 QTC Calculation: 509 R Axis:   -52 Text Interpretation:  Sinus rhythm RBBB and LAFB Left ventricular hypertrophy Artifact in lead(s) I II III aVR aVL aVF V1 Confirmed by Donnetta Hutching ([id number]) on 04/30/2018 1:09:48 PM   Radiology Dg Chest Portable 1 View  Result Date: 04/30/2018 CLINICAL DATA:  Chest pain since last night with left shoulder and arm pain. EXAM: PORTABLE CHEST 1 VIEW COMPARISON:  04/08/2018 and 02/25/2018 FINDINGS: Lungs are adequately inflated without focal airspace consolidation or effusion. Mild stable cardiomegaly. Prominent overlying soft tissues. Remainder the exam is unchanged. IMPRESSION: No acute findings. Stable cardiomegaly. Electronically Signed   By: Elberta Fortis M.D.   On: 04/30/2018 12:18    Procedures Procedures (including critical care time)  Medications Ordered in ED Medications  phytonadione (VITAMIN K) tablet 5 mg (5 mg Oral Given 04/30/18 1535)     Initial Impression / Assessment and Plan / ED Course  I have reviewed the triage vital signs and the nursing notes.  Pertinent labs & imaging results that were available during my care of the patient were reviewed by me and considered in my medical decision making (see chart for details).     Patient presents with chest pain and a concern about an elevated INR.  EKG, chest x-ray,  troponin all negative acute.  INR still greater than 10.  Discussed with pharmacist.  She recommended vitamin K 5 mg orally.  Will admit to hospitalist.   CRITICAL CARE Performed by: Donnetta Hutching Total critical care time: 30 minutes Critical care time was exclusive of separately billable procedures and treating other patients. Critical care was necessary to treat or prevent imminent or life-threatening deterioration. Critical care was time spent personally by me on the following activities: development of treatment plan with patient and/or surrogate as well as nursing, discussions with consultants, evaluation of patient's response to treatment, examination of patient, obtaining history from patient or surrogate, ordering and performing treatments and interventions, ordering and review of laboratory studies, ordering and review of radiographic studies, pulse oximetry and re-evaluation of patient's condition.  Final Clinical Impressions(s) / ED Diagnoses   Final diagnoses:  Elevated  INR  Chest pain, unspecified type    ED Discharge Orders    None       Donnetta Hutching, MD 04/30/18 1639    Donnetta Hutching, MD 04/30/18 1820

## 2018-04-30 NOTE — ED Triage Notes (Signed)
Pt c/o pain in left shoulder, chest, and left arm since last night.  Reports her INR was elevated a few days ago and says she was told not to take her blood thinner for a few days.  Pt says movement makes her shoulder pain worse.

## 2018-04-30 NOTE — H&P (Signed)
History and Physical  [name] ZOX:[id number] DOB: 1934-10-20 DOA: 04/30/2018  Referring physician: Dr. Adriana Simas, ED physician PCP: [name], MD  Outpatient Specialists:   Patient Coming From: Home  Chief Complaint: Shoulder pain elevated INR  HPI: [name] is a 83 y.o. female with a history of Parkinson's disease, history of PE, diastolic heart failure.  Patient seen for elevated INR that started 3 days ago.  Patient had INR checked in PCPs office which was supratherapeutic.  Patient has not taken Coumadin since that time.  She has some chest/shoulder pain that started last night and has continued although improved now.  She reports no spontaneous bleeding, although reports mild black stool yesterday.  She denies additional doses, antibiotic use, medication changes.  Emergency Department Course: INR greater than 10.  Vitamin K 5 mg given orally.  Chest x-ray negative.  Troponin negative.  Review of Systems:   Pt denies any fevers, chills, nausea, vomiting, diarrhea, constipation, abdominal pain, shortness of breath, dyspnea on exertion, orthopnea, cough, wheezing, palpitations, headache, vision changes, lightheadedness, dizziness, melena, rectal bleeding.  Review of systems are otherwise negative  Past Medical History:  Diagnosis Date  . CHF (congestive heart failure) (HCC)   . History of pulmonary embolism   . Parkinson's disease (HCC)   . Venous stasis    History reviewed. No pertinent surgical history. Social History:  reports that she has never smoked. She has never used smokeless tobacco. She reports that she does not drink alcohol or use drugs. Patient lives at home  Allergies  Allergen Reactions  . Contrast Media [Iodinated Diagnostic Agents] Anaphylaxis and Shortness Of Breath  . Vancomycin Shortness Of Breath    REACTION: SOB  . Vioxx [Rofecoxib]     REACTION: irregular heartbeat    Family History  Problem Relation Age of Onset  . Heart disease  Mother        90s      Prior to Admission medications   Medication Sig Start Date End Date Taking? Authorizing Provider  furosemide (LASIX) 20 MG tablet Take 1 tablet (20 mg total) by mouth daily. 04/08/18   Terrilee Files, MD  potassium chloride SA (K-DUR,KLOR-CON) 20 MEQ tablet Take 1 tablet (20 mEq total) by mouth daily. 04/08/18   Terrilee Files, MD    Physical Exam: BP (!) 156/82   Pulse 82   Temp 98.2 F (36.8 C) (Oral)   Resp 13   Ht 5\' 3"  (1.6 m)   Wt 68 kg   SpO2 99%   BMI 26.57 kg/m   . General: Elderly Caucasian female. Awake and alert and oriented x3. No acute cardiopulmonary distress.  Marland Kitchen HEENT: Normocephalic atraumatic.  Right and left ears normal in appearance.  Pupils equal, round, reactive to light. Extraocular muscles are intact. Sclerae anicteric and noninjected.  Moist mucosal membranes. No mucosal lesions.  . Neck: Neck supple without lymphadenopathy. No carotid bruits. No masses palpated.  . Cardiovascular: Regular rate with normal S1-S2 sounds. No murmurs, rubs, gallops auscultated. No JVD.  Marland Kitchen Respiratory: Good respiratory effort with no wheezes, rales, rhonchi. Lungs clear to auscultation bilaterally.  No accessory muscle use. . Abdomen: Soft, nontender, nondistended. Active bowel sounds. No masses or hepatosplenomegaly  . Skin: No rashes, lesions, or ulcerations.  Dry, warm to touch. 2+ dorsalis pedis and radial pulses. . Musculoskeletal: No calf or leg pain. All major joints not erythematous nontender.  No upper or lower joint deformation.  Good ROM.  No contractures  .  Psychiatric: Intact judgment and insight. Pleasant and cooperative. . Neurologic: Patient has chronic weakness and neck and has difficulty holding up her head.  Additionally, the patient has significant dyskinesias secondary to the Parkinson's disease.  No focal neurological deficits. Cranial nerves II through XII are grossly intact.           Labs on Admission: I have personally  reviewed following labs and imaging studies  CBC: Recent Labs  Lab 04/30/18 1221  WBC 6.4  HGB 12.9  HCT 41.5  MCV 88.1  PLT 198   Basic Metabolic Panel: Recent Labs  Lab 04/30/18 1221  NA 140  K 4.2  CL 109  CO2 23  GLUCOSE 82  BUN 18  CREATININE 0.59  CALCIUM 8.7*   GFR: Estimated Creatinine Clearance: 51 mL/min (by C-G formula based on SCr of 0.59 mg/dL). Liver Function Tests: No results for input(s): AST, ALT, ALKPHOS, BILITOT, PROT, ALBUMIN in the last 168 hours. No results for input(s): LIPASE, AMYLASE in the last 168 hours. No results for input(s): AMMONIA in the last 168 hours. Coagulation Profile: Recent Labs  Lab 04/27/18 1757 04/30/18 1221  INR >10.00* >10.00*   Cardiac Enzymes: No results for input(s): CKTOTAL, CKMB, CKMBINDEX, TROPONINI in the last 168 hours. BNP (last 3 results) No results for input(s): PROBNP in the last 8760 hours. HbA1C: No results for input(s): HGBA1C in the last 72 hours. CBG: No results for input(s): GLUCAP in the last 168 hours. Lipid Profile: No results for input(s): CHOL, HDL, LDLCALC, TRIG, CHOLHDL, LDLDIRECT in the last 72 hours. Thyroid Function Tests: No results for input(s): TSH, T4TOTAL, FREET4, T3FREE, THYROIDAB in the last 72 hours. Anemia Panel: No results for input(s): VITAMINB12, FOLATE, FERRITIN, TIBC, IRON, RETICCTPCT in the last 72 hours. Urine analysis:    Component Value Date/Time   COLORURINE STRAW (A) 03/19/2018 1221   APPEARANCEUR CLEAR 03/19/2018 1221   LABSPEC 1.008 03/19/2018 1221   PHURINE 8.0 03/19/2018 1221   GLUCOSEU NEGATIVE 03/19/2018 1221   HGBUR LARGE (A) 03/19/2018 1221   BILIRUBINUR NEGATIVE 03/19/2018 1221   KETONESUR NEGATIVE 03/19/2018 1221   PROTEINUR NEGATIVE 03/19/2018 1221   UROBILINOGEN 0.2 12/20/2014 2054   NITRITE NEGATIVE 03/19/2018 1221   LEUKOCYTESUR NEGATIVE 03/19/2018 1221   Sepsis Labs: @LABRCNTIP (procalcitonin:4,lacticidven:4) )No results found for this or any  previous visit (from the past 240 hour(s)).   Radiological Exams on Admission: Dg Chest Portable 1 View  Result Date: 04/30/2018 CLINICAL DATA:  Chest pain since last night with left shoulder and arm pain. EXAM: PORTABLE CHEST 1 VIEW COMPARISON:  04/08/2018 and 02/25/2018 FINDINGS: Lungs are adequately inflated without focal airspace consolidation or effusion. Mild stable cardiomegaly. Prominent overlying soft tissues. Remainder the exam is unchanged. IMPRESSION: No acute findings. Stable cardiomegaly. Electronically Signed   By: Elberta Fortis M.D.   On: 04/30/2018 12:18    EKG: Independently reviewed.  This rhythm with right bundle branch block and left anterior fascicular block.  No ST changes.  Difficult to read secondary to artifact  Assessment/Plan: Principal Problem:   Supratherapeutic INR Active Problems:   Chronic diastolic CHF (congestive heart failure) (HCC)   PULMONARY EMBOLISM, HX OF   Long term (current) use of anticoagulants   Parkinson's disease (HCC)    This patient was discussed with the ED physician, including pertinent vitals, physical exam findings, labs, and imaging.  We also discussed care given by the ED provider.  1. Supratherapeutic INR a. Status post vitamin K 5 mg b. Check INR tomorrow  2. Chronic diastolic heart failure a. Currently compensated 3. Parkinson's disease a. We will check pharmacy for meds 4. History of PE a. Patient may need to be on different anticoagulation as this is second or third time that her INR has been supratherapeutic.  DVT prophylaxis: Supratherapeutic INR Consultants: None Code Status: DNR -confirmed with patient Family Communication: Daughter in the room during interview and exam Disposition Plan: Patient should be able to return home following improvement of INR   Noralee Chars [facility] Pager [phone]  If 7PM-7AM, please contact night-coverage [email or web address] Password [email or web address]

## 2018-04-30 NOTE — ED Notes (Signed)
Call [phone] to give report-was told the nurse will call me back to get report.

## 2018-04-30 NOTE — Progress Notes (Signed)
PT refuses pulse ox, fall socks, heel protectors, flu and pneumonia vaccine. Continue to monitor.

## 2018-04-30 NOTE — ED Notes (Addendum)
Called the floor to give report-was put on hold and no one ever answered.

## 2018-04-30 NOTE — ED Notes (Signed)
Date and time results received: 04/30/18 1:45 PM   Test: INR Critical Value: greater than 10.0  Name of Provider Notified: Dr. Adriana Simas  Orders Received? Or Actions Taken? See orders.

## 2018-05-01 DIAGNOSIS — R079 Chest pain, unspecified: Secondary | ICD-10-CM

## 2018-05-01 DIAGNOSIS — R0789 Other chest pain: Secondary | ICD-10-CM | POA: Diagnosis not present

## 2018-05-01 DIAGNOSIS — I5032 Chronic diastolic (congestive) heart failure: Secondary | ICD-10-CM | POA: Diagnosis not present

## 2018-05-01 DIAGNOSIS — Z7901 Long term (current) use of anticoagulants: Secondary | ICD-10-CM | POA: Diagnosis not present

## 2018-05-01 DIAGNOSIS — R791 Abnormal coagulation profile: Secondary | ICD-10-CM | POA: Diagnosis not present

## 2018-05-01 LAB — PROTIME-INR
INR: 2.57
PROTHROMBIN TIME: 27.2 s — AB (ref 11.4–15.2)

## 2018-05-01 LAB — TROPONIN I
Troponin I: <0.03 ng/mL (ref <0.03)
Troponin I: <0.03 ng/mL (ref <0.03)

## 2018-05-01 NOTE — Progress Notes (Signed)
Patient has been verbally aggressive with staff during night shift, threw telemetry box at staff, has been rude and demanding.  Supervisor came to floor and explained to patient that all rude, demeaning, and aggressive behavior would not be acceptable while patient was in the hospital. Patient verbalized understanding. Patient was appropriately rounded on during night shift, and needs attended to in a timely manner.

## 2018-05-01 NOTE — Progress Notes (Signed)
PROGRESS NOTE  [name] ZOX:[id number] DOB: 06-18-1935 DOA: 04/30/2018 PCP: [name], MD  Brief History:  83 y.o.femalewith medical history ofdiastolic CHF, remote PE, Parkinson's disease, venous stasis dermatitis,and dysphagiapresenting with numerous complaints today, but primarily her chief complaint is that of left-sided chest pain and shoulder pain. This represents the patient's fifth admission in the past 10 months.  The patient is essentially bedbound.  She has had previous admissions with similar complaints of shoulder pain and chest discomfort while she is in bed without any significant worsening shortness of breath, nausea, vomiting, dizziness.  She denies any palpitations or syncope.  She denies any coughing or hemoptysis.  Upon presentation, the patient was noted to have INR greater than 10.  The patient states that she had INR checked at the PCP office on 04/27/2018 and it was noted to be greater than 10.  She was told to stop her warfarin which she states she has not taken since 04/28/2018.  Normally, the patient takes 2.5 mg daily except for Fridays when she takes 3.75 mg.  She states that she has noticed one episode of hematochezia, but denies any hemoptysis, hematemesis, melena.  Assessment/Plan: Atypical chest pain -Cycle troponins -Personally reviewed EKG--sinus rhythm, unchanged right bundle branch block -Personally reviewed chest x-ray--poor inspiration, no consolidation or pulmonary edema -02/23/2018 echo EF 55 to 60%, no WMA, mild TR  Coagulopathy -vitamin K 5 mg po given 11/9 -INR improved to 2.57 -Has been discussed with the patient in the past 3 admissions to switch toa NOAC to which she refused currently -she does not want to stop anticoagulation altogether -all risks, benefits, alternatives have been discussed previously x 3 and discussed again today -repeat INR in am  Chronic diastolic CHF -Difficult to clarify the patient's fluid status  given her body habitus -Lasix IV x1 and reassess-->no significant change clinically -AlthoughLasix allergy is listed, the patient has received furosemide and her previous hospital admission -clinically euvolemic  History of pulmonary embolus -multiple discussions with the patient in the past from discontinuation of warfarin altogether versus changing toNOAC -She is resistant to any type of change -PE occurredover10 years ago -Per patient history--patient has never had any family hx of VTE previous PE nor any subsequent PE -check factor V Leiden, prothrombin gene mutation, lupus anticoagulant--all negative  Parkinson's Disease -no presently on any meds  Hematochezia -pt refuses further work up -Hgb stable -am CBC    Disposition Plan:   Home 11/11 if stable Family Communication:  No Family at bedside  Consultants:  none  Code Status:  DNR  DVT Prophylaxis:  warfarin   Procedures: As Listed in Progress Note Above  Antibiotics: None  Total time spent 35 minutes.  Greater than 50% spent face to face counseling and coordinating care.          Subjective: Patient complains of intermittent shoulder pain and chest discomfort.  She denies any worsening shortness of breath.  She denies any nausea, vomiting, diarrhea, abdominal pain, dysuria.  Objective: Vitals:   04/30/18 1930 04/30/18 2012 04/30/18 2040 05/01/18 0717  BP: 129/84 (!) 154/74  133/68  Pulse: 99 92  87  Resp: 20 18  18   Temp:   98.4 F (36.9 C) 98.2 F (36.8 C)  TempSrc:   Oral Oral  SpO2: 95% 97%  98%  Weight:      Height:        Intake/Output Summary (Last 24 hours) at 05/01/2018  1708 Last data filed at 05/01/2018 1250 Gross per 24 hour  Intake 720 ml  Output -  Net 720 ml   Weight change:  Exam:   General:  Pt is alert, follows commands appropriately, not in acute distress  HEENT: No icterus, No thrush, No neck mass, [place]/AT  Cardiovascular: RRR, S1/S2, no rubs, no  gallops  Respiratory: Fine bibasilar crackles but no wheezing.  Good air movement.  Abdomen: Soft/+BS, non tender, non distended, no guarding  Extremities: No edema, No lymphangitis, No petechiae, No rashes, no synovitis   Data Reviewed: I have personally reviewed following labs and imaging studies Basic Metabolic Panel: Recent Labs  Lab 04/30/18 1221  NA 140  K 4.2  CL 109  CO2 23  GLUCOSE 82  BUN 18  CREATININE 0.59  CALCIUM 8.7*   Liver Function Tests: No results for input(s): AST, ALT, ALKPHOS, BILITOT, PROT, ALBUMIN in the last 168 hours. No results for input(s): LIPASE, AMYLASE in the last 168 hours. No results for input(s): AMMONIA in the last 168 hours. Coagulation Profile: Recent Labs  Lab 04/27/18 1757 04/30/18 1221 05/01/18 0739  INR >10.00* >10.00* 2.57   CBC: Recent Labs  Lab 04/30/18 1221  WBC 6.4  HGB 12.9  HCT 41.5  MCV 88.1  PLT 198   Cardiac Enzymes: No results for input(s): CKTOTAL, CKMB, CKMBINDEX, TROPONINI in the last 168 hours. BNP: Invalid input(s): POCBNP CBG: No results for input(s): GLUCAP in the last 168 hours. HbA1C: No results for input(s): HGBA1C in the last 72 hours. Urine analysis:    Component Value Date/Time   COLORURINE STRAW (A) 03/19/2018 1221   APPEARANCEUR CLEAR 03/19/2018 1221   LABSPEC 1.008 03/19/2018 1221   PHURINE 8.0 03/19/2018 1221   GLUCOSEU NEGATIVE 03/19/2018 1221   HGBUR LARGE (A) 03/19/2018 1221   BILIRUBINUR NEGATIVE 03/19/2018 1221   KETONESUR NEGATIVE 03/19/2018 1221   PROTEINUR NEGATIVE 03/19/2018 1221   UROBILINOGEN 0.2 12/20/2014 2054   NITRITE NEGATIVE 03/19/2018 1221   LEUKOCYTESUR NEGATIVE 03/19/2018 1221   Sepsis Labs: @LABRCNTIP (procalcitonin:4,lacticidven:4) )No results found for this or any previous visit (from the past 240 hour(s)).   Scheduled Meds: Continuous Infusions:  Procedures/Studies: Dg Chest Portable 1 View  Result Date: 04/30/2018 CLINICAL DATA:  Chest pain  since last night with left shoulder and arm pain. EXAM: PORTABLE CHEST 1 VIEW COMPARISON:  04/08/2018 and 02/25/2018 FINDINGS: Lungs are adequately inflated without focal airspace consolidation or effusion. Mild stable cardiomegaly. Prominent overlying soft tissues. Remainder the exam is unchanged. IMPRESSION: No acute findings. Stable cardiomegaly. Electronically Signed   By: Elberta Fortis M.D.   On: 04/30/2018 12:18   Dg Chest Portable 1 View  Result Date: 04/08/2018 CLINICAL DATA:  Lower extremity edema, 2 days duration. EXAM: PORTABLE CHEST 1 VIEW COMPARISON:  03/19/2018 FINDINGS: Mild chronic cardiac enlargement. Aortic atherosclerosis. There may be venous hypertension but there is no frank edema. No sign of infiltrate, effusion or collapse. IMPRESSION: Possible venous hypertension, but no frank edema or effusions. Electronically Signed   By: Paulina Fusi M.D.   On: 04/08/2018 16:02    Catarina Hartshorn, DO  [facility] Pager [phone]  If 7PM-7AM, please contact night-coverage [email or web address] Password [email or web address] 05/01/2018, 5:08 PM   LOS: 0 days

## 2018-05-02 DIAGNOSIS — R0789 Other chest pain: Secondary | ICD-10-CM | POA: Diagnosis not present

## 2018-05-02 DIAGNOSIS — Z7901 Long term (current) use of anticoagulants: Secondary | ICD-10-CM | POA: Diagnosis not present

## 2018-05-02 DIAGNOSIS — R079 Chest pain, unspecified: Secondary | ICD-10-CM | POA: Diagnosis not present

## 2018-05-02 DIAGNOSIS — R791 Abnormal coagulation profile: Secondary | ICD-10-CM | POA: Diagnosis not present

## 2018-05-02 DIAGNOSIS — I5032 Chronic diastolic (congestive) heart failure: Secondary | ICD-10-CM | POA: Diagnosis not present

## 2018-05-02 LAB — BASIC METABOLIC PANEL
Anion gap: 5 (ref 5–15)
BUN: 16 mg/dL (ref 8–23)
CALCIUM: 8.4 mg/dL — AB (ref 8.9–10.3)
CO2: 26 mmol/L (ref 22–32)
CREATININE: 0.64 mg/dL (ref 0.44–1.00)
Chloride: 108 mmol/L (ref 98–111)
GFR calc Af Amer: >60 mL/min (ref >60)
GFR calc non Af Amer: >60 mL/min (ref >60)
GLUCOSE: 91 mg/dL (ref 70–99)
Potassium: 4 mmol/L (ref 3.5–5.1)
Sodium: 139 mmol/L (ref 135–145)

## 2018-05-02 LAB — TROPONIN I

## 2018-05-02 LAB — CBC
HEMATOCRIT: 39 % (ref 36.0–46.0)
Hemoglobin: 12.1 g/dL (ref 12.0–15.0)
MCH: 27.7 pg (ref 26.0–34.0)
MCHC: 31 g/dL (ref 30.0–36.0)
MCV: 89.2 fL (ref 80.0–100.0)
NRBC: 0 % (ref 0.0–0.2)
Platelets: 206 10*3/uL (ref 150–400)
RBC: 4.37 MIL/uL (ref 3.87–5.11)
RDW: 13.6 % (ref 11.5–15.5)
WBC: 4.7 10*3/uL (ref 4.0–10.5)

## 2018-05-02 LAB — MAGNESIUM: Magnesium: 2.2 mg/dL (ref 1.7–2.4)

## 2018-05-02 LAB — PROTIME-INR
INR: 1.97
PROTHROMBIN TIME: 22.2 s — AB (ref 11.4–15.2)

## 2018-05-02 NOTE — Care Management (Signed)
Pt discharging home today. Active with [facility] pta, under observation and does not need resumption order. Bonita Quin, [facility] rep, aware pt returning home today.

## 2018-05-02 NOTE — Discharge Summary (Signed)
Physician Discharge Summary  [name] QIO:[id number] DOB: Feb 08, 1935 DOA: 04/30/2018  PCP: [name], MD  Admit date: 04/30/2018 Discharge date: 05/02/2018  Admitted From: Home Disposition:  Home   Recommendations for Outpatient Follow-up:  1. Follow up with PCP in 1-2 weeks 2. Please obtain BMP/CBC in one week   Home Health: YES Equipment/Devices: HHRN  Discharge Condition: Stable CODE STATUS: DNR Diet recommendation: Heart Healthy   Brief/Interim Summary: 83 y.o.femalewith medical history ofdiastolic CHF, remote PE, Parkinson's disease, venous stasis dermatitis,and dysphagiapresenting with numerous complaints today, but primarily her chief complaint is that of left-sided chest pain and shoulder pain. This represents the patient's fifth admission in the past 10 months.  The patient is essentially bedbound.  She has had previous admissions with similar complaints of shoulder pain and chest discomfort while she is in bed without any significant worsening shortness of breath, nausea, vomiting, dizziness.  She denies any palpitations or syncope.  She denies any coughing or hemoptysis.  Upon presentation, the patient was noted to have INR greater than 10.  The patient states that she had INR checked at the PCP office on 04/27/2018 and it was noted to be greater than 10.  She was told to stop her warfarin which she states she has not taken since 04/28/2018.  Normally, the patient takes 2.5 mg daily except for Fridays when she takes 3.75 mg.  She states that she has noticed one episode of hematochezia, but denies any hemoptysis, hematemesis, melena.  Discharge Diagnoses:  Atypical chest pain -Cycle troponins---neg -Personally reviewed EKG--sinus rhythm, unchanged right bundle branch block -Personally reviewed chest x-ray--poor inspiration, no consolidation or pulmonary edema -02/23/2018 echo EF 55 to 60%, no WMA, mild TR -pt without tachycardia or hypoxia -likely musculoskeletal  due to her body habitus  Coagulopathy -vitamin K 5 mg po given 11/9 -INR improved to 2.57>>>1.97 on day of d/c -Has been discussed with the patient in the past 3 admissions to switch toa NOAC to which she refusedcurrently -she does not want to stop anticoagulation altogether -all risks, benefits, alternatives have been discussed previously x 3 and discussed again this admission -pt wants to resume coumadin-->can resume previous home dose  Chronic diastolic CHF -Difficult to clarify the patient's fluid status given her body habitus but appears euvolemic overally on exam -AlthoughLasix allergy is listed, the patient has received furosemide and her previous hospital admission -clinically euvolemic  History of pulmonary embolus -multiple discussions with the patient in the past from discontinuation of warfarin altogether versus changing toNOAC -She is resistant to any type of change -PEoccurredover10 years ago -Per patient history--patient has never had anyfamily hx of VTEprevious PE nor any subsequent PE -check factor V Leiden, prothrombin gene mutation, lupus anticoagulant--all negative  Parkinson's Disease -no presently on any meds  Hematochezia -pt refuses further work up -Hgb stable--12.1 on day of d/c (12.3 on day of admit)       Discharge Instructions   Allergies as of 05/02/2018      Reactions   Contrast Media [iodinated Diagnostic Agents] Anaphylaxis, Shortness Of Breath   Vancomycin Shortness Of Breath   Vioxx [rofecoxib] Other (See Comments)   REACTION: irregular heartbeat      Medication List    STOP taking these medications   furosemide 20 MG tablet Commonly known as:  LASIX   potassium chloride SA 20 MEQ tablet Commonly known as:  K-DUR,KLOR-CON     TAKE these medications   warfarin 2.5 MG tablet Commonly known as:  COUMADIN  Take 2.5 mg by mouth every evening.       Allergies  Allergen Reactions  . Contrast Media [Iodinated  Diagnostic Agents] Anaphylaxis and Shortness Of Breath  . Vancomycin Shortness Of Breath  . Vioxx [Rofecoxib] Other (See Comments)    REACTION: irregular heartbeat    Consultations:  none   Procedures/Studies: Dg Chest Portable 1 View  Result Date: 04/30/2018 CLINICAL DATA:  Chest pain since last night with left shoulder and arm pain. EXAM: PORTABLE CHEST 1 VIEW COMPARISON:  04/08/2018 and 02/25/2018 FINDINGS: Lungs are adequately inflated without focal airspace consolidation or effusion. Mild stable cardiomegaly. Prominent overlying soft tissues. Remainder the exam is unchanged. IMPRESSION: No acute findings. Stable cardiomegaly. Electronically Signed   By: Elberta Fortis M.D.   On: 04/30/2018 12:18   Dg Chest Portable 1 View  Result Date: 04/08/2018 CLINICAL DATA:  Lower extremity edema, 2 days duration. EXAM: PORTABLE CHEST 1 VIEW COMPARISON:  03/19/2018 FINDINGS: Mild chronic cardiac enlargement. Aortic atherosclerosis. There may be venous hypertension but there is no frank edema. No sign of infiltrate, effusion or collapse. IMPRESSION: Possible venous hypertension, but no frank edema or effusions. Electronically Signed   By: Paulina Fusi M.D.   On: 04/08/2018 16:02        Discharge Exam: Vitals:   05/01/18 1423 05/01/18 2112  BP: (!) 142/65 129/72  Pulse:  82  Resp:  18  Temp: 98 F (36.7 C) 98.2 F (36.8 C)  SpO2: 98% 96%   Vitals:   04/30/18 2040 05/01/18 0717 05/01/18 1423 05/01/18 2112  BP:  133/68 (!) 142/65 129/72  Pulse:  87  82  Resp:  18  18  Temp: 98.4 F (36.9 C) 98.2 F (36.8 C) 98 F (36.7 C) 98.2 F (36.8 C)  TempSrc: Oral Oral Oral Oral  SpO2:  98% 98% 96%  Weight:      Height:        General: Pt is alert, awake, not in acute distress Cardiovascular: RRR, S1/S2 +, no rubs, no gallops Respiratory: diminished breath sounds but CTA bilaterally, no wheezing, no rhonchi Abdominal: Soft, NT, ND, bowel sounds + Extremities: no edema, no  cyanosis   The results of significant diagnostics from this hospitalization (including imaging, microbiology, ancillary and laboratory) are listed below for reference.    Significant Diagnostic Studies: Dg Chest Portable 1 View  Result Date: 04/30/2018 CLINICAL DATA:  Chest pain since last night with left shoulder and arm pain. EXAM: PORTABLE CHEST 1 VIEW COMPARISON:  04/08/2018 and 02/25/2018 FINDINGS: Lungs are adequately inflated without focal airspace consolidation or effusion. Mild stable cardiomegaly. Prominent overlying soft tissues. Remainder the exam is unchanged. IMPRESSION: No acute findings. Stable cardiomegaly. Electronically Signed   By: Elberta Fortis M.D.   On: 04/30/2018 12:18   Dg Chest Portable 1 View  Result Date: 04/08/2018 CLINICAL DATA:  Lower extremity edema, 2 days duration. EXAM: PORTABLE CHEST 1 VIEW COMPARISON:  03/19/2018 FINDINGS: Mild chronic cardiac enlargement. Aortic atherosclerosis. There may be venous hypertension but there is no frank edema. No sign of infiltrate, effusion or collapse. IMPRESSION: Possible venous hypertension, but no frank edema or effusions. Electronically Signed   By: Paulina Fusi M.D.   On: 04/08/2018 16:02     Microbiology: No results found for this or any previous visit (from the past 240 hour(s)).   Labs: Basic Metabolic Panel: Recent Labs  Lab 04/30/18 1221 05/02/18 0548  NA 140 139  K 4.2 4.0  CL 109 108  CO2 23  26  GLUCOSE 82 91  BUN 18 16  CREATININE 0.59 0.64  CALCIUM 8.7* 8.4*  MG  --  2.2   Liver Function Tests: No results for input(s): AST, ALT, ALKPHOS, BILITOT, PROT, ALBUMIN in the last 168 hours. No results for input(s): LIPASE, AMYLASE in the last 168 hours. No results for input(s): AMMONIA in the last 168 hours. CBC: Recent Labs  Lab 04/30/18 1221 05/02/18 0548  WBC 6.4 4.7  HGB 12.9 12.1  HCT 41.5 39.0  MCV 88.1 89.2  PLT 198 206   Cardiac Enzymes: Recent Labs  Lab 05/01/18 1733  05/01/18 2239 05/02/18 0548  TROPONINI <0.03 <0.03 <0.03   BNP: Invalid input(s): POCBNP CBG: No results for input(s): GLUCAP in the last 168 hours.  Time coordinating discharge:  36 minutes  Signed:  Catarina Hartshorn, DO [facility] Pager: [phone] 05/02/2018, 12:01 PM

## 2018-05-02 NOTE — Progress Notes (Signed)
Patient discharged home today per MD orders. Patient vital signs WDL. IV removed and site WDL. Discharge Instructions including follow up appointments, medications, and education reviewed with patient. Patient verbalizes understanding. Patient is transported out via EMS. Caregiver was called and was not successfully contacted. Sister, Kathie Rhodes, was contacted and made aware of patients discharge.

## 2018-05-02 NOTE — Progress Notes (Signed)
Patient refusing to allow nursing techs to feed her. Patient is being very rude with rude statements being expressed to staff members. Staff members are not able to enter room without patient cursing and telling them to leave.

## 2018-06-09 ENCOUNTER — Telehealth (HOSPITAL_COMMUNITY): Payer: Self-pay | Admitting: Internal Medicine

## 2018-06-09 NOTE — Telephone Encounter (Signed)
06/09/18  Weston BrassNick with [facility] just spoke to the patient and she wants to hold off with therapy for now.... she has too much going o

## 2018-06-30 ENCOUNTER — Encounter (HOSPITAL_COMMUNITY): Payer: Self-pay | Admitting: Emergency Medicine

## 2018-06-30 ENCOUNTER — Emergency Department (HOSPITAL_COMMUNITY): Payer: Medicare Other

## 2018-06-30 ENCOUNTER — Other Ambulatory Visit: Payer: Self-pay

## 2018-06-30 ENCOUNTER — Observation Stay (HOSPITAL_COMMUNITY)
Admission: EM | Admit: 2018-06-30 | Discharge: 2018-07-01 | Disposition: A | Discharge status: Home / Self Care | Payer: Medicare Other | Attending: Internal Medicine | Admitting: Internal Medicine

## 2018-06-30 DIAGNOSIS — R0789 Other chest pain: Secondary | ICD-10-CM | POA: Diagnosis not present

## 2018-06-30 DIAGNOSIS — Z86711 Personal history of pulmonary embolism: Secondary | ICD-10-CM | POA: Diagnosis not present

## 2018-06-30 DIAGNOSIS — R079 Chest pain, unspecified: Secondary | ICD-10-CM

## 2018-06-30 DIAGNOSIS — I5032 Chronic diastolic (congestive) heart failure: Secondary | ICD-10-CM | POA: Diagnosis present

## 2018-06-30 DIAGNOSIS — Z86718 Personal history of other venous thrombosis and embolism: Secondary | ICD-10-CM

## 2018-06-30 DIAGNOSIS — G20 Parkinson's disease: Secondary | ICD-10-CM | POA: Diagnosis not present

## 2018-06-30 LAB — CBC WITH DIFFERENTIAL/PLATELET
ABS IMMATURE GRANULOCYTES: 0.01 10*3/uL (ref 0.00–0.07)
Basophils Absolute: 0 10*3/uL (ref 0.0–0.1)
Basophils Relative: 1 %
EOS PCT: 5 %
Eosinophils Absolute: 0.3 10*3/uL (ref 0.0–0.5)
HEMATOCRIT: 44.1 % (ref 36.0–46.0)
HEMOGLOBIN: 13.6 g/dL (ref 12.0–15.0)
Immature Granulocytes: 0 %
LYMPHS PCT: 20 %
Lymphs Abs: 1.2 10*3/uL (ref 0.7–4.0)
MCH: 27.4 pg (ref 26.0–34.0)
MCHC: 30.8 g/dL (ref 30.0–36.0)
MCV: 88.9 fL (ref 80.0–100.0)
MONO ABS: 0.5 10*3/uL (ref 0.1–1.0)
MONOS PCT: 8 %
NEUTROS ABS: 4 10*3/uL (ref 1.7–7.7)
Neutrophils Relative %: 66 %
Platelets: 220 10*3/uL (ref 150–400)
RBC: 4.96 MIL/uL (ref 3.87–5.11)
RDW: 13.8 % (ref 11.5–15.5)
WBC: 6.1 10*3/uL (ref 4.0–10.5)
nRBC: 0 % (ref 0.0–0.2)

## 2018-06-30 LAB — PROTIME-INR
INR: 1.07
PROTHROMBIN TIME: 13.8 s (ref 11.4–15.2)

## 2018-06-30 LAB — BASIC METABOLIC PANEL
Anion gap: 7 (ref 5–15)
BUN: 12 mg/dL (ref 8–23)
CHLORIDE: 105 mmol/L (ref 98–111)
CO2: 25 mmol/L (ref 22–32)
CREATININE: 0.61 mg/dL (ref 0.44–1.00)
Calcium: 8.6 mg/dL — ABNORMAL LOW (ref 8.9–10.3)
GFR calc Af Amer: >60 mL/min (ref >60)
GFR calc non Af Amer: >60 mL/min (ref >60)
GLUCOSE: 82 mg/dL (ref 70–99)
POTASSIUM: 3.9 mmol/L (ref 3.5–5.1)
Sodium: 137 mmol/L (ref 135–145)

## 2018-06-30 LAB — TROPONIN I: Troponin I: <0.03 ng/mL (ref <0.03)

## 2018-06-30 LAB — I-STAT CG4 LACTIC ACID, ED: Lactic Acid, Venous: 0.95 mmol/L (ref 0.5–1.9)

## 2018-06-30 MED ORDER — ACETAMINOPHEN 650 MG RE SUPP: 650.0000 mg | Freq: Four times a day (QID) | RECTAL | Status: DC | PRN

## 2018-06-30 MED ORDER — ONDANSETRON HCL 4 MG PO TABS: 4.0000 mg | ORAL_TABLET | Freq: Four times a day (QID) | ORAL | Status: DC | PRN

## 2018-06-30 MED ORDER — ACETAMINOPHEN 325 MG PO TABS: 650.0000 mg | ORAL_TABLET | Freq: Four times a day (QID) | ORAL | Status: DC | PRN

## 2018-06-30 MED ORDER — ASPIRIN EC 81 MG PO TBEC
81.0000 mg | DELAYED_RELEASE_TABLET | Freq: Every day | ORAL | Status: DC
  Administered 2018-07-01: 81 mg via ORAL
  Filled 2018-06-30: qty 1

## 2018-06-30 MED ORDER — ENOXAPARIN SODIUM 40 MG/0.4ML ~~LOC~~ SOLN
40.0000 mg | SUBCUTANEOUS | Status: DC
  Administered 2018-06-30: 40 mg via SUBCUTANEOUS
  Filled 2018-06-30: qty 0.4

## 2018-06-30 MED ORDER — ONDANSETRON HCL 4 MG/2ML IJ SOLN: 4.0000 mg | Freq: Four times a day (QID) | INTRAMUSCULAR | Status: DC | PRN

## 2018-06-30 MED ORDER — ASPIRIN 81 MG PO CHEW
324.0000 mg | CHEWABLE_TABLET | Freq: Once | ORAL | Status: AC
  Administered 2018-06-30: 324 mg via ORAL
  Filled 2018-06-30: qty 4

## 2018-06-30 MED ORDER — SODIUM CHLORIDE 0.9% FLUSH
3.0000 mL | Freq: Two times a day (BID) | INTRAVENOUS | Status: DC
  Administered 2018-06-30 – 2018-07-01 (×2): 3 mL via INTRAVENOUS

## 2018-06-30 NOTE — ED Notes (Signed)
Patient requested sandwich, notified AC to bring sandwich.

## 2018-06-30 NOTE — H&P (Addendum)
History and Physical    [name] ZOX:[id number] DOB: 1935-03-01 DOA: 06/30/2018  PCP: [name], MD  Patient coming from: Home  I have personally briefly reviewed patient's old medical records in [facility]  Chief Complaint: Left-sided chest pain, palpitations, dyspnea  HPI: [name] is a 84 y.o. female with medical history significant for Parkinson's disease, history of PE no longer on anticoagulation, diastolic CHF, chronic venous stasis, and severe kyphosis presents the ED with left-sided heavy chest pain and palpitations.  Patient states her symptoms began 2 or 3 days ago and were intermittent however worsened about 1-2 hours prior to ED presentation. She had associated chills and neck pain.  She denies any lightheadedness, dizziness, abdominal pain.  Patient lives alone and has a home aide who helps her.  She says she can ambulate with the use of a walker only if she has further assistance from someone.  She is currently not taking any medications.  She has a history of PE and has been taken off of Coumadin due to difficulty controlling INR with multiple admissions for supratherapeutic ranges.  ED Course:  Initial vitals show BP 142/82, pulse 89, RR 16, temp 97.5 Fahrenheit, SPO2 99% on room air.  Labs are notable for troponin I <0.03, INR 1.07, lactic acid 0.95.  CBC and BMP were largely unremarkable.  Patient was given aspirin 324 mg once in the hospital service was consulted who admit for chest pain rule out.  Review of Systems: As per HPI otherwise 10 point review of systems negative.    Past Medical History:  Diagnosis Date  . CHF (congestive heart failure) (HCC)   . History of pulmonary embolism   . Parkinson's disease (HCC)   . Venous stasis     History reviewed. No pertinent surgical history.   reports that she has never smoked. She has never used smokeless tobacco. She reports that she does not drink alcohol or use drugs.  Allergies  Allergen  Reactions  . Contrast Media [Iodinated Diagnostic Agents] Anaphylaxis and Shortness Of Breath  . Vancomycin Shortness Of Breath  . Vioxx [Rofecoxib] Other (See Comments)    REACTION: irregular heartbeat    Family History  Problem Relation Age of Onset  . Heart disease Mother        90s     Prior to Admission medications   Medication Sig Start Date End Date Taking? Authorizing Provider  warfarin (COUMADIN) 2.5 MG tablet Take 2.5 mg by mouth every evening.    [provider]    Physical Exam: Vitals:   06/30/18 1944 06/30/18 2030 06/30/18 2100 06/30/18 2130  BP:  (!) 150/81  137/67  Pulse: 89     Resp: 16 14 14 14   Temp:      TempSrc:      SpO2: 99%       Constitutional: Elderly chronically ill-appearing woman, NAD, calm, comfortable Eyes: PERRL, lids and conjunctivae normal ENMT: Mucous membranes are moist. Posterior pharynx clear of any exudate or lesions. Neck: normal, supple, no masses. Respiratory: clear to auscultation bilaterally, no wheezing, no crackles. Normal respiratory effort. No accessory muscle use.  Cardiovascular: Regular rate and rhythm, no murmurs / rubs / gallops. +2 pitting edema bilateral lower extremities. Abdomen: no tenderness, no masses palpated. No hepatosplenomegaly.  Musculoskeletal: Severe kyphosis of the neck with head in flexion position and chin nearly touching chest wall  Skin: Chronic venous stasis changes of both lower extremities Neurologic: Sensation decreased lower extremities,  Psychiatric: Normal judgment and insight. Alert and oriented x 3. Normal mood.   Labs on Admission: I have personally reviewed following labs and imaging studies  CBC: Recent Labs  Lab 06/30/18 1957  WBC 6.1  NEUTROABS 4.0  HGB 13.6  HCT 44.1  MCV 88.9  PLT 220   Basic Metabolic Panel: Recent Labs  Lab 06/30/18 1957  NA 137  K 3.9  CL 105  CO2 25  GLUCOSE 82  BUN 12  CREATININE 0.61  CALCIUM 8.6*   GFR: CrCl cannot be calculated  (Unknown ideal weight.). Liver Function Tests: No results for input(s): AST, ALT, ALKPHOS, BILITOT, PROT, ALBUMIN in the last 168 hours. No results for input(s): LIPASE, AMYLASE in the last 168 hours. No results for input(s): AMMONIA in the last 168 hours. Coagulation Profile: Recent Labs  Lab 06/30/18 1957  INR 1.07   Cardiac Enzymes: Recent Labs  Lab 06/30/18 1957  TROPONINI <0.03   BNP (last 3 results) No results for input(s): PROBNP in the last 8760 hours. HbA1C: No results for input(s): HGBA1C in the last 72 hours. CBG: No results for input(s): GLUCAP in the last 168 hours. Lipid Profile: No results for input(s): CHOL, HDL, LDLCALC, TRIG, CHOLHDL, LDLDIRECT in the last 72 hours. Thyroid Function Tests: No results for input(s): TSH, T4TOTAL, FREET4, T3FREE, THYROIDAB in the last 72 hours. Anemia Panel: No results for input(s): VITAMINB12, FOLATE, FERRITIN, TIBC, IRON, RETICCTPCT in the last 72 hours. Urine analysis:    Component Value Date/Time   COLORURINE STRAW (A) 03/19/2018 1221   APPEARANCEUR CLEAR 03/19/2018 1221   LABSPEC 1.008 03/19/2018 1221   PHURINE 8.0 03/19/2018 1221   GLUCOSEU NEGATIVE 03/19/2018 1221   HGBUR LARGE (A) 03/19/2018 1221   BILIRUBINUR NEGATIVE 03/19/2018 1221   KETONESUR NEGATIVE 03/19/2018 1221   PROTEINUR NEGATIVE 03/19/2018 1221   UROBILINOGEN 0.2 12/20/2014 2054   NITRITE NEGATIVE 03/19/2018 1221   LEUKOCYTESUR NEGATIVE 03/19/2018 1221    Radiological Exams on Admission: Dg Chest Port 1 View  Result Date: 06/30/2018 CLINICAL DATA:  Chest pain and short of breath. Decreased oxygen saturation. EXAM: PORTABLE CHEST 1 VIEW COMPARISON:  04/30/2018 FINDINGS: The examination is limited by the patient's head and neck which obscures the lung apices. The heart is mildly enlarged. Normal vascularity. Clear lungs. No pneumothorax or pleural effusion. IMPRESSION: No active disease. Electronically Signed   By: Jolaine ClickArthur  Hoss M.D.   On: 06/30/2018  20:26    EKG: Sinus rhythm, RBBB with LAFB, LVH, motion artifact present.  No significant change from prior.  Assessment/Plan Principal Problem:   Chest pain Active Problems:   Chronic diastolic CHF (congestive heart failure) (HCC)   PULMONARY EMBOLISM, HX OF   Parkinson's disease (HCC)   [name] is a 84 y.o. female with medical history significant for Parkinson's disease, history of PE no longer on anticoagulation, diastolic CHF, chronic venous stasis, and severe kyphosis presents the ED with left-sided heavy chest pain and palpitations, admitted for chest pain rule out.  Chest pain: Patient has nonspecific mid left-sided chest pain with palpitations, neck pain, and occasional shortness of breath.  Suspect portion of her symptoms are related to musculoskeletal disease given her severe kyphosis.  Will admit for chest pain rule out although she is unlikely to be a suitable candidate for intervention beyond medical management she was found to have cardiac related symptoms. -Trend troponins -Monitor on telemetry  History of pulmonary embolism: Remote history of PE previously on Coumadin which was discontinued about 5  weeks ago due to frequent issues of supratherapeutic INR.  She is currently not on anticoagulation.  She has been noted to previously refuse a change to DOAC and there has been concern for fall risk.  She is currently hemodynamically stable and saturating well on room air. -Continue monitor -Will continue to hold anticoagulation  Chronic diastolic CHF: TTE 02/23/2018 showed EF 55-60%, grade 1 diastolic dysfunction.  She has chronic swelling of both of her legs likely due to venous stasis.  She otherwise has no symptoms of acute CHF exacerbation. -Continue to monitor  Parkinson's disease: She is not on any medications for Parkinson's disease.  DVT prophylaxis: Lovenox Code Status: DNR/DNI-confirmed with patient Family Communication: Home aide present at bedside, no  family present Disposition Plan: Likely discharge to home in 1-2 days Consults called: None Admission status: Observation   Darreld McleanVishal Sabella Traore MD [facility] Pager [phone]  If 7PM-7AM, please contact night-coverage [email or web address]  06/30/2018, 10:08 PM

## 2018-06-30 NOTE — ED Notes (Signed)
Pt stated she wanted another gown to take upstairs because hers is wet. I told pt I would be happy to assist her change in ER. Pt declined. Stated she did not want to move at this time.

## 2018-06-30 NOTE — ED Notes (Signed)
Patient stated she did not want the Aspirin at this time.

## 2018-06-30 NOTE — ED Triage Notes (Signed)
Pt comes from home via EMS. Pt C/O chest pain and SOB that started 2 days ago.

## 2018-06-30 NOTE — ED Notes (Signed)
Pt is wet. Pt states that she did not want me to change her.

## 2018-06-30 NOTE — ED Notes (Signed)
Patient changed mind about Aspirin. Aspirin given.

## 2018-06-30 NOTE — ED Notes (Signed)
Patient's O2 saturation decreased to low 80's. Patient refused oxygen. Pt stated she just needed her airway repositioned using towel under chin. Pt requested me to pull her head back. I met resistance and stopped. I told pt I could not push her head back any further. Pt upset stating it does not hurt to pull her head up.

## 2018-06-30 NOTE — ED Provider Notes (Signed)
[facility] EMERGENCY DEPARTMENT Provider Note   CSN: [id number] Arrival date & time: 06/30/18  1923     History   Chief Complaint Chief Complaint  Patient presents with  . Chest Pain    HPI [name] is a 84 y.o. female.  HPI  The patient has a history of congestive heart failure as well as pulmonary embolisms, venous stasis and Parkinson's disease.  She had previously been on Coumadin however she had stopped taking this medicine approximately 5 weeks ago due to some wide swings in INR and difficulty controlling it according to the patient.  She was actually admitted on November 9 because of severe elevation in her INR over 10.  She was also having some chest pain, she had cycled troponins which were negative, EKG showed right bundle branch block but no other findings.  The patient presents today stating that she has had some recurrent palpitations and chest pain which have been intermittent over the last couple of days.  She states that this time they are mild, it is more the chest discomfort at this time.  She has had a feeling like her heart is racing on and off but states it is not happening at this time.  She also reports feeling hot having subjective fevers and chills and noting that her bilateral legs are becoming more erythematous with warmth spreading up from the mid shins up towards the knees which is unusual for her.  She has Parkinson's disease, she lives by herself and has daily private health aides that come to the house to help her.  Past Medical History:  Diagnosis Date  . CHF (congestive heart failure) (HCC)   . History of pulmonary embolism   . Parkinson's disease (HCC)   . Venous stasis     Patient Active Problem List   Diagnosis Date Noted  . Goals of care, counseling/discussion   . Palliative care by specialist   . DNR (do not resuscitate) discussion   . Coagulopathy (HCC) 03/19/2018  . Elevated INR   . Supratherapeutic INR 02/24/2018  . Physical  debility 02/24/2018  . Folliculitis of perineum 12/15/2017  . Streptococcal bacteremia 07/09/2017  . Cellulitis of lower extremity   . Bacteremia due to Gram-positive bacteria 07/06/2017  . Cellulitis and abscess of left leg 07/05/2017  . Vulvar abscess 07/05/2017  . Infected cyst of Bartholin's gland duct 07/05/2017  . Weakness 12/21/2014  . Leg weakness 12/20/2014  . Lower extremity weakness 12/20/2014  . Precordial pain 12/20/2014  . Diarrhea   . Chest pain 11/28/2014  . Pressure ulcer, heel 11/28/2014  . AKI (acute kidney injury) (HCC) 11/28/2014  . Chronic diastolic congestive heart failure (HCC) 11/28/2014  . Parkinson's disease (HCC) 11/28/2014  . Pain in the chest   . Encounter for therapeutic drug monitoring 09/15/2013  . Long term (current) use of anticoagulants 09/22/2010  . Pulmonary embolism (HCC) 08/25/2010  . Generalized weakness 08/16/2009  . LEG EDEMA 08/16/2009  . Chronic diastolic CHF (congestive heart failure) (HCC) 08/16/2009  . Venous (peripheral) insufficiency 11/15/2008  . PULMONARY EMBOLISM, HX OF 11/15/2008    History reviewed. No pertinent surgical history.   OB History   No obstetric history on file.      Home Medications    Prior to Admission medications   Medication Sig Start Date End Date Taking? Authorizing Provider  warfarin (COUMADIN) 2.5 MG tablet Take 2.5 mg by mouth every evening.    [provider]    Family History  Family History  Problem Relation Age of Onset  . Heart disease Mother        6990s    Social History Social History   Tobacco Use  . Smoking status: Never Smoker  . Smokeless tobacco: Never Used  Substance Use Topics  . Alcohol use: No  . Drug use: No     Allergies   Contrast media [iodinated diagnostic agents]; Vancomycin; and Vioxx [rofecoxib]   Review of Systems Review of Systems  All other systems reviewed and are negative.    Physical Exam Updated Vital Signs BP (!) 150/81   Pulse  89   Temp (!) 97.5 F (36.4 C) (Oral)   Resp 14   SpO2 99%   Physical Exam Vitals signs and nursing note reviewed.  Constitutional:      General: She is not in acute distress.    Appearance: She is well-developed.  HENT:     Head: Normocephalic and atraumatic.     Mouth/Throat:     Pharynx: No oropharyngeal exudate.  Eyes:     General: No scleral icterus.       Right eye: No discharge.        Left eye: No discharge.     Conjunctiva/sclera: Conjunctivae normal.     Pupils: Pupils are equal, round, and reactive to light.  Neck:     Musculoskeletal: Normal range of motion and neck supple.     Thyroid: No thyromegaly.     Vascular: No JVD.  Cardiovascular:     Rate and Rhythm: Normal rate and regular rhythm.     Heart sounds: Normal heart sounds. No murmur. No friction rub. No gallop.      Comments: Pulse ranges between 95 and 105 and what appears to be sinus rhythm.  Weak peripheral pulses Pulmonary:     Effort: Pulmonary effort is normal. No respiratory distress.     Breath sounds: Normal breath sounds. No wheezing or rales.  Abdominal:     General: Bowel sounds are normal. There is no distension.     Palpations: Abdomen is soft. There is no mass.     Tenderness: There is no abdominal tenderness.  Musculoskeletal: Normal range of motion.        General: No tenderness.     Right lower leg: Edema present.     Left lower leg: Edema present.     Comments: Severe kyphosis of her cervical and upper thoracic spine.  The patient is lurched forward in this chronic kyphotic position.  Lymphadenopathy:     Cervical: No cervical adenopathy.  Skin:    General: Skin is warm and dry.     Findings: Erythema and rash present.     Comments: Bilateral lower extremities with pitting edema, legs are warm to hot to the touch between the ankle and the knees.  Neurological:     Mental Status: She is alert.     Coordination: Coordination normal.     Comments: The patient speech is clear, she  is able to follow commands, she does have a resting tremor with her bilateral hands  Psychiatric:        Behavior: Behavior normal.      ED Treatments / Results  Labs (all labs ordered are listed, but only abnormal results are displayed) Labs Reviewed  BASIC METABOLIC PANEL - Abnormal; Notable for the following components:      Result Value   Calcium 8.6 (*)    All other components within normal limits  TROPONIN I  CBC WITH DIFFERENTIAL/PLATELET  PROTIME-INR  I-STAT CG4 LACTIC ACID, ED  I-STAT CG4 LACTIC ACID, ED    EKG None  Radiology Dg Chest Port 1 View  Result Date: 06/30/2018 CLINICAL DATA:  Chest pain and short of breath. Decreased oxygen saturation. EXAM: PORTABLE CHEST 1 VIEW COMPARISON:  04/30/2018 FINDINGS: The examination is limited by the patient's head and neck which obscures the lung apices. The heart is mildly enlarged. Normal vascularity. Clear lungs. No pneumothorax or pleural effusion. IMPRESSION: No active disease. Electronically Signed   By: Jolaine Click M.D.   On: 06/30/2018 20:26    Procedures Procedures (including critical care time)  Medications Ordered in ED Medications  aspirin chewable tablet 324 mg (324 mg Oral Given 06/30/18 2023)     Initial Impression / Assessment and Plan / ED Course  I have reviewed the triage vital signs and the nursing notes.  Pertinent labs & imaging results that were available during my care of the patient were reviewed by me and considered in my medical decision making (see chart for details).     The patient will need an EKG chest x-ray and labs, she has had cardiac work-up in the past as far as blood work goes, she had an echocardiogram September 4 of 2019 showing ejection fraction of 55 to 60%, grade 1 diastolic dysfunction was present. Review of the medical record shows that other than the echocardiogram she did have an evaluation by cardiology in June 2019, they recommended conservative therapy at that time, the  patient does not have a history of stress test or heart catheterization at least in the last 10 years that I can see in the medical record.  She does have a fairly poor functional status at home.  The patient has a negative troponin, I personally viewed the PA chest x-ray, no signs of infiltrate or pneumothorax.  Discussed with the hospitalist for admission.  Final Clinical Impressions(s) / ED Diagnoses   Final diagnoses:  Chest pain, unspecified type      Eber Hong, MD 06/30/18 2131

## 2018-07-01 DIAGNOSIS — Z86718 Personal history of other venous thrombosis and embolism: Secondary | ICD-10-CM

## 2018-07-01 DIAGNOSIS — R079 Chest pain, unspecified: Secondary | ICD-10-CM

## 2018-07-01 DIAGNOSIS — I5032 Chronic diastolic (congestive) heart failure: Secondary | ICD-10-CM

## 2018-07-01 DIAGNOSIS — R0789 Other chest pain: Secondary | ICD-10-CM | POA: Diagnosis not present

## 2018-07-01 LAB — CBC
HCT: 40.5 % (ref 36.0–46.0)
Hemoglobin: 12.6 g/dL (ref 12.0–15.0)
MCH: 27.7 pg (ref 26.0–34.0)
MCHC: 31.1 g/dL (ref 30.0–36.0)
MCV: 89 fL (ref 80.0–100.0)
Platelets: 213 10*3/uL (ref 150–400)
RBC: 4.55 MIL/uL (ref 3.87–5.11)
RDW: 13.9 % (ref 11.5–15.5)
WBC: 5.2 10*3/uL (ref 4.0–10.5)
nRBC: 0 % (ref 0.0–0.2)

## 2018-07-01 LAB — TROPONIN I
Troponin I: <0.03 ng/mL (ref <0.03)
Troponin I: <0.03 ng/mL (ref <0.03)

## 2018-07-01 LAB — BASIC METABOLIC PANEL
Anion gap: 6 (ref 5–15)
BUN: 17 mg/dL (ref 8–23)
CO2: 25 mmol/L (ref 22–32)
Calcium: 8.9 mg/dL (ref 8.9–10.3)
Chloride: 109 mmol/L (ref 98–111)
Creatinine, Ser: 0.66 mg/dL (ref 0.44–1.00)
GFR calc Af Amer: >60 mL/min (ref >60)
GFR calc non Af Amer: >60 mL/min (ref >60)
Glucose, Bld: 87 mg/dL (ref 70–99)
Potassium: 4 mmol/L (ref 3.5–5.1)
Sodium: 140 mmol/L (ref 135–145)

## 2018-07-01 MED ORDER — POTASSIUM CHLORIDE ER 10 MEQ PO TBCR: 10.0000 meq | EXTENDED_RELEASE_TABLET | Freq: Every day | ORAL | 0 refills | Status: DC

## 2018-07-01 MED ORDER — FUROSEMIDE 20 MG PO TABS: 20.0000 mg | ORAL_TABLET | Freq: Every day | ORAL | 11 refills | Status: DC

## 2018-07-01 NOTE — Care Management Note (Signed)
Case Management Note  Patient Details  Name: BRITTONI ALAND MRN: [id number] Date of Birth: May 18, 1935  Subjective/Objective:    Chest pain. From home, has almost 24/7 care, pay OOP for this. Would like home health again. Home health agencies options discussed, patient elects [facility] again.                Action/Plan: DC home with home health. [facility] of [facility] notified and will obtain orders from Epic.   Expected Discharge Date:     07/01/18             Expected Discharge Plan:  Home w Home Health Services  In-House Referral:     Discharge planning Services  CM Consult  Post Acute Care Choice:  Home Health Choice offered to:  Patient  DME Arranged:    DME Agency:     HH Arranged:  RN, PT, OT HH Agency:  [facility]  Status of Service:  Completed, signed off  If discussed at Long Length of Stay Meetings, dates discussed:    Additional Comments:  Anastazia [place], Chrystine Oiler, RN 07/01/2018, 1:51 PM

## 2018-07-01 NOTE — Care Management Obs Status (Signed)
MEDICARE OBSERVATION STATUS NOTIFICATION   Patient Details  Name: [name] MRN: [id number] Date of Birth: 1935-02-16   Medicare Observation Status Notification Given:       [name] 07/01/2018, 10:47 AM

## 2018-07-01 NOTE — Discharge Summary (Signed)
Physician Discharge Summary  LAN CHANNELL NGE:[id number] DOB: 01-19-1935 DOA: 06/30/2018  PCP: [name], MD  Admit date: 06/30/2018 Discharge date: 07/01/2018  Admitted From: Home Disposition: Home  Recommendations for Outpatient Follow-up:  1. Follow up with PCP in 1-2 weeks 2. Please obtain BMP/CBC in one week 3. Follow-up with primary care physician to discuss initiation of Xarelto  Home Health: Home health PT, OT, RN, aide, speech therapy Equipment/Devices:  Discharge Condition: Stable CODE STATUS: DNR Diet recommendation: Heart healthy  Brief/Interim Summary: 84 year old female admitted to the hospital with atypical chest pain.  She did not have any acute EKG changes.  She ruled out for ACS with negative cardiac markers.  It was suspected that her discomfort may be related to musculoskeletal origin in the setting of her advanced scoliosis.  The patient was previously taking warfarin for history of pulmonary embolism.  She did not take this for about 5 weeks.  In the past, medications like Xarelto/Eliquis have been discussed but she has been reluctant to try this.  She wishes to discuss this further with her primary care physician before initiating this.  She is not had any recent thromboembolism.  She does have some lower extremity edema and will be started on low-dose Lasix.  She complains of some numbness in her feet bilaterally which has recently improved with improvement of her lower extremity edema.  Patient appears to be at her functional baseline.  Appears ready for discharge home.  Discharge Diagnoses:  Principal Problem:   Chest pain Active Problems:   Chronic diastolic CHF (congestive heart failure) (HCC)   PULMONARY EMBOLISM, HX OF   Parkinson's disease [facility])    Discharge Instructions  Discharge Instructions    Diet - low sodium heart healthy   Complete by:  As directed    Increase activity slowly   Complete by:  As directed      Allergies as of 07/01/2018       Reactions   Contrast Media [iodinated Diagnostic Agents] Anaphylaxis, Shortness Of Breath   Vancomycin Shortness Of Breath   Vioxx [rofecoxib] Other (See Comments)   REACTION: irregular heartbeat      Medication List    TAKE these medications   furosemide 20 MG tablet Commonly known as:  LASIX Take 1 tablet (20 mg total) by mouth daily.   potassium chloride 10 MEQ tablet Commonly known as:  K-DUR Take 1 tablet (10 mEq total) by mouth daily.   warfarin 2.5 MG tablet Commonly known as:  COUMADIN Take 2.5 mg by mouth every evening.      Follow-up Information    [facility] Follow up.   Contact information: [address] [place] [address] [phone]         Allergies  Allergen Reactions  . Contrast Media [Iodinated Diagnostic Agents] Anaphylaxis and Shortness Of Breath  . Vancomycin Shortness Of Breath  . Vioxx [Rofecoxib] Other (See Comments)    REACTION: irregular heartbeat    Consultations:     Procedures/Studies: Dg Chest Port 1 View  Result Date: 06/30/2018 CLINICAL DATA:  Chest pain and short of breath. Decreased oxygen saturation. EXAM: PORTABLE CHEST 1 VIEW COMPARISON:  04/30/2018 FINDINGS: The examination is limited by the patient's head and neck which obscures the lung apices. The heart is mildly enlarged. Normal vascularity. Clear lungs. No pneumothorax or pleural effusion. IMPRESSION: No active disease. Electronically Signed   By: Jolaine Click M.D.   On: 06/30/2018 20:26  Subjective: Pain in neck/shoulder/chest-improved.  Discharge Exam: Vitals:   06/30/18 2230 06/30/18 2300 06/30/18 2347 07/01/18 0025  BP: (!) 152/82 138/67 (!) 148/83   Pulse:   (!) 242   Resp: 13 13 18    Temp:   (!) 97.4 F (36.3 C)   TempSrc:   Oral   SpO2:   90%   Weight:    66.6 kg  Height:    5\' 4"  (1.626 m)    General: Pt is alert, awake, not in acute distress Cardiovascular: RRR, S1/S2 +, no rubs, no  gallops Respiratory: CTA bilaterally, no wheezing, no rhonchi Abdominal: Soft, NT, ND, bowel sounds + Extremities: 1+ edema in lower extremity with chronic venous stasis changes    The results of significant diagnostics from this hospitalization (including imaging, microbiology, ancillary and laboratory) are listed below for reference.     Microbiology: No results found for this or any previous visit (from the past 240 hour(s)).   Labs: BNP (last 3 results) Recent Labs    02/22/18 2321 03/19/18 1211 04/08/18 1521  BNP 167.0* 66.0 180.0*   Basic Metabolic Panel: Recent Labs  Lab 06/30/18 1957 07/01/18 0452  NA 137 140  K 3.9 4.0  CL 105 109  CO2 25 25  GLUCOSE 82 87  BUN 12 17  CREATININE 0.61 0.66  CALCIUM 8.6* 8.9   Liver Function Tests: No results for input(s): AST, ALT, ALKPHOS, BILITOT, PROT, ALBUMIN in the last 168 hours. No results for input(s): LIPASE, AMYLASE in the last 168 hours. No results for input(s): AMMONIA in the last 168 hours. CBC: Recent Labs  Lab 06/30/18 1957 07/01/18 0452  WBC 6.1 5.2  NEUTROABS 4.0  --   HGB 13.6 12.6  HCT 44.1 40.5  MCV 88.9 89.0  PLT 220 213   Cardiac Enzymes: Recent Labs  Lab 06/30/18 1957 07/01/18 0452 07/01/18 1051  TROPONINI <0.03 <0.03 <0.03   BNP: Invalid input(s): POCBNP CBG: No results for input(s): GLUCAP in the last 168 hours. D-Dimer No results for input(s): DDIMER in the last 72 hours. Hgb A1c No results for input(s): HGBA1C in the last 72 hours. Lipid Profile No results for input(s): CHOL, HDL, LDLCALC, TRIG, CHOLHDL, LDLDIRECT in the last 72 hours. Thyroid function studies No results for input(s): TSH, T4TOTAL, T3FREE, THYROIDAB in the last 72 hours.  Invalid input(s): FREET3 Anemia work up No results for input(s): VITAMINB12, FOLATE, FERRITIN, TIBC, IRON, RETICCTPCT in the last 72 hours. Urinalysis    Component Value Date/Time   COLORURINE STRAW (A) 03/19/2018 1221   APPEARANCEUR  CLEAR 03/19/2018 1221   LABSPEC 1.008 03/19/2018 1221   PHURINE 8.0 03/19/2018 1221   GLUCOSEU NEGATIVE 03/19/2018 1221   HGBUR LARGE (A) 03/19/2018 1221   BILIRUBINUR NEGATIVE 03/19/2018 1221   KETONESUR NEGATIVE 03/19/2018 1221   PROTEINUR NEGATIVE 03/19/2018 1221   UROBILINOGEN 0.2 12/20/2014 2054   NITRITE NEGATIVE 03/19/2018 1221   LEUKOCYTESUR NEGATIVE 03/19/2018 1221   Sepsis Labs Invalid input(s): PROCALCITONIN,  WBC,  LACTICIDVEN Microbiology No results found for this or any previous visit (from the past 240 hour(s)).   Time coordinating discharge:  SIGNED:   Erick Blinks, MD  [facility] 07/01/2018, 8:18 PM Pager   If 7PM-7AM, please contact night-coverage [email or web address] Password [email or web address]

## 2018-07-01 NOTE — Clinical Social Work Note (Signed)
Received LCSW consult for Advanced Directive assistance. CSW discussed with Pastoral Care Chaplain Elease Hashimoto and she will follow up on this pt request.

## 2018-07-06 ENCOUNTER — Ambulatory Visit (HOSPITAL_COMMUNITY): Payer: Medicare Other | Admitting: Occupational Therapy

## 2018-07-19 ENCOUNTER — Other Ambulatory Visit: Payer: Medicare Other | Admitting: Nurse Practitioner

## 2018-07-21 ENCOUNTER — Emergency Department (HOSPITAL_COMMUNITY)
Admission: EM | Admit: 2018-07-21 | Discharge: 2018-07-22 | Disposition: A | Discharge status: Home / Self Care | Payer: Medicare Other | Attending: Emergency Medicine | Admitting: Emergency Medicine

## 2018-07-21 ENCOUNTER — Emergency Department (HOSPITAL_COMMUNITY): Payer: Medicare Other

## 2018-07-21 ENCOUNTER — Encounter (HOSPITAL_COMMUNITY): Payer: Self-pay

## 2018-07-21 ENCOUNTER — Other Ambulatory Visit: Payer: Self-pay

## 2018-07-21 DIAGNOSIS — R791 Abnormal coagulation profile: Secondary | ICD-10-CM | POA: Insufficient documentation

## 2018-07-21 DIAGNOSIS — R0602 Shortness of breath: Secondary | ICD-10-CM

## 2018-07-21 DIAGNOSIS — Z7901 Long term (current) use of anticoagulants: Secondary | ICD-10-CM | POA: Diagnosis not present

## 2018-07-21 DIAGNOSIS — Z86711 Personal history of pulmonary embolism: Secondary | ICD-10-CM | POA: Diagnosis not present

## 2018-07-21 DIAGNOSIS — G20 Parkinson's disease: Secondary | ICD-10-CM | POA: Insufficient documentation

## 2018-07-21 DIAGNOSIS — Z5181 Encounter for therapeutic drug level monitoring: Secondary | ICD-10-CM | POA: Diagnosis not present

## 2018-07-21 LAB — BASIC METABOLIC PANEL
Anion gap: 7 (ref 5–15)
BUN: 18 mg/dL (ref 8–23)
CALCIUM: 8.6 mg/dL — AB (ref 8.9–10.3)
CO2: 21 mmol/L — ABNORMAL LOW (ref 22–32)
Chloride: 108 mmol/L (ref 98–111)
Creatinine, Ser: 0.57 mg/dL (ref 0.44–1.00)
GFR calc Af Amer: >60 mL/min (ref >60)
GFR calc non Af Amer: >60 mL/min (ref >60)
Glucose, Bld: 89 mg/dL (ref 70–99)
Potassium: 4.1 mmol/L (ref 3.5–5.1)
Sodium: 136 mmol/L (ref 135–145)

## 2018-07-21 LAB — PROTIME-INR
INR: 1.09
Prothrombin Time: 14.1 seconds (ref 11.4–15.2)

## 2018-07-21 LAB — CBC
HCT: 42.1 % (ref 36.0–46.0)
Hemoglobin: 13.1 g/dL (ref 12.0–15.0)
MCH: 27.6 pg (ref 26.0–34.0)
MCHC: 31.1 g/dL (ref 30.0–36.0)
MCV: 88.6 fL (ref 80.0–100.0)
Platelets: 209 10*3/uL (ref 150–400)
RBC: 4.75 MIL/uL (ref 3.87–5.11)
RDW: 13.9 % (ref 11.5–15.5)
WBC: 6 10*3/uL (ref 4.0–10.5)
nRBC: 0 % (ref 0.0–0.2)

## 2018-07-21 LAB — TROPONIN I: Troponin I: <0.03 ng/mL (ref <0.03)

## 2018-07-21 MED ORDER — WARFARIN - PHARMACIST DOSING INPATIENT: Freq: Every day | Status: DC

## 2018-07-21 MED ORDER — ACETAMINOPHEN 500 MG PO TABS
1000.0000 mg | ORAL_TABLET | Freq: Three times a day (TID) | ORAL | Status: DC | PRN
  Administered 2018-07-22: 1000 mg via ORAL
  Filled 2018-07-21: qty 2

## 2018-07-21 MED ORDER — WARFARIN SODIUM 2.5 MG PO TABS
2.5000 mg | ORAL_TABLET | Freq: Once | ORAL | Status: AC
  Administered 2018-07-22: 2.5 mg via ORAL
  Filled 2018-07-21 (×2): qty 1

## 2018-07-21 NOTE — Discharge Instructions (Signed)
You were evaluated in the Emergency Department and after careful evaluation, we did not find any emergent condition requiring admission or further testing in the hospital.  Your INR was low at 1.09 today.  Please discuss this with your PCP to determine your Coumadin dosing.  Please return to the Emergency Department if you experience any worsening of your condition.  We encourage you to follow up with a primary care provider.  Thank you for allowing [place] to be a part of your care.

## 2018-07-21 NOTE — ED Notes (Signed)
Called ac for med 

## 2018-07-21 NOTE — ED Provider Notes (Signed)
[facility] Emergency Department Provider Note MRN:  [id number]  Arrival date & time: 07/21/18     Chief Complaint   Shortness of Breath   History of Present Illness   [name] is a 84 y.o. year-old female with a history of Parkinson's, CHF, pulmonary embolism, venous stasis presenting to the ED with chief complaint of shortness of breath.  Mild shortness of breath this morning while ambulating, also with some sharp chest pain as well as left shoulder pain.  Constant throughout the day, chest pain does not seem to be related to exertion.  Denies fever or cough, no changes in pain or swelling to her legs, no abdominal pain.  Review of Systems  A complete 10 system review of systems was obtained and all systems are negative except as noted in the HPI and PMH.   Patient's Health History    Past Medical History:  Diagnosis Date  . CHF (congestive heart failure) (HCC)   . History of pulmonary embolism   . Parkinson's disease (HCC)   . Venous stasis     History reviewed. No pertinent surgical history.  Family History  Problem Relation Age of Onset  . Heart disease Mother        190s    Social History   Socioeconomic History  . Marital status: Single    Spouse name: Not on file  . Number of children: Not on file  . Years of education: Not on file  . Highest education level: Not on file  Occupational History  . Not on file  Social Needs  . Financial resource strain: Not on file  . Food insecurity:    Worry: Not on file    Inability: Not on file  . Transportation needs:    Medical: Not on file    Non-medical: Not on file  Tobacco Use  . Smoking status: Never Smoker  . Smokeless tobacco: Never Used  Substance and Sexual Activity  . Alcohol use: No  . Drug use: No  . Sexual activity: Not on file  Lifestyle  . Physical activity:    Days per week: Not on file    Minutes per session: Not on file  . Stress: Not on file  Relationships  . Social  connections:    Talks on phone: Not on file    Gets together: Not on file    Attends religious service: Not on file    Active member of club or organization: Not on file    Attends meetings of clubs or organizations: Not on file    Relationship status: Not on file  . Intimate partner violence:    Fear of current or ex partner: Not on file    Emotionally abused: Not on file    Physically abused: Not on file    Forced sexual activity: Not on file  Other Topics Concern  . Not on file  Social History Narrative  . Not on file     Physical Exam  Vital Signs and Nursing Notes reviewed Vitals:   07/21/18 2030 07/21/18 2100  BP: (!) 150/75   Pulse:  88  Resp: 13 16  Temp:    SpO2:  96%    CONSTITUTIONAL: Chronically ill-appearing, NAD NEURO:  Alert and oriented x 3, no focal deficits EYES:  eyes equal and reactive ENT/NECK:  no LAD, no JVD CARDIO: Regular rate, well-perfused, normal S1 and S2 PULM:  CTAB no wheezing or rhonchi GI/GU:  normal bowel sounds,  non-distended, non-tender MSK/SPINE:  No gross deformities, 1+ pitting edema to bilateral lower extremities with chronic venous stasis erythema SKIN:  no rash, atraumatic PSYCH:  Appropriate speech and behavior  Diagnostic and Interventional Summary    EKG Interpretation  Date/Time:  Thursday July 21 2018 19:17:09 EST Ventricular Rate:  86 PR Interval:    QRS Duration: 151 QT Interval:  368 QTC Calculation: 441 R Axis:   -66 Text Interpretation:  Sinus rhythm Supraventricular bigeminy RBBB and LAFB Left ventricular hypertrophy Artifact in lead(s) I II III aVR aVL aVF V1 V2 Confirmed by Kennis Carina [phone]) on 07/21/2018 7:44:51 PM      Labs Reviewed  BASIC METABOLIC PANEL - Abnormal; Notable for the following components:      Result Value   CO2 21 (*)    Calcium 8.6 (*)    All other components within normal limits  CBC  TROPONIN I  PROTIME-INR    DG Chest Port 1 View  Final Result      Medications - No  data to display   Procedures Critical Care  ED Course and Medical Decision Making  I have reviewed the triage vital signs and the nursing notes.  Pertinent labs & imaging results that were available during my care of the patient were reviewed by me and considered in my medical decision making (see below for details).  Mild atypical chest pain and dyspnea on exertion earlier today, no symptoms currently.  Vital signs stable, had a recent admission for chest pain rule out, chest pain is favored to be noncardiac.  Little to no concern for DVT or PE given the lack of symptom changes to her legs, no increased work of breathing, no tachycardia, satting well on room air.  Work-up pending.  Troponin negative, chest x-ray unremarkable.  INR is subtherapeutic at 1.09.  Patient continues to look and feel well, appropriate for discharge.   Unfortunately, patient has home health nursing at home from 8 AM to 4 PM and then again from 4 PM to 8 PM.  These nurses get her out of bed in the morning, and put her to bed at night.  Current time 10 PM, no one to help her into bed at home, unable to discharge at this time.  Will allow her to stay in the emergency department overnight, ready for discharge in the morning.  Pharmacy consulted for warfarin dosing.  Patient advised to follow-up closely with her PCP regarding her Coumadin dosing.  After the discussed management above, the patient was determined to be safe for discharge.  The patient was in agreement with this plan and all questions regarding their care were answered.  ED return precautions were discussed and the patient will return to the ED with any significant worsening of condition.  Elmer Sow. Pilar Plate, MD [facility] Emergency Medicine [facility] [email or web address]  Final Clinical Impressions(s) / ED Diagnoses     ICD-10-CM   1. Subtherapeutic anticoagulation Z51.81    Z79.01   2. SOB (shortness of breath) R06.02 DG Chest [facility] 1  View    DG Chest Port 1 View    CANCELED: DG Chest 2 View    CANCELED: DG Chest 2 View    ED Discharge Orders    None         Sabas Sous, MD 07/21/18 2213

## 2018-07-21 NOTE — ED Triage Notes (Addendum)
Pt in by [facility] for sob and chest discomfort.  Pt denies pain or discomfort at this time, is awake, alert, oriented.  Pt appears nad, no obvious sob.

## 2018-07-22 DIAGNOSIS — Z5181 Encounter for therapeutic drug level monitoring: Secondary | ICD-10-CM | POA: Diagnosis not present

## 2018-07-22 LAB — PROTIME-INR
INR: 1.16
Prothrombin Time: 14.7 seconds (ref 11.4–15.2)

## 2018-07-22 MED ORDER — WARFARIN SODIUM 5 MG PO TABS: 5.0000 mg | ORAL_TABLET | Freq: Once | ORAL | Status: DC

## 2018-07-22 NOTE — ED Notes (Signed)
Pt repositioned. Encouraged to get some rest

## 2018-07-22 NOTE — ED Notes (Signed)
Called [facility] for transport back home

## 2018-07-22 NOTE — ED Notes (Signed)
Pt placed on purewick 

## 2018-07-22 NOTE — Progress Notes (Signed)
ANTICOAGULATION CONSULT NOTE - Initial Consult  Pharmacy Consult for warfarin Indication:  VTE prophylaxis INR Goal: 2-3 Home Dose: warfarin 2.5mg  q evening Last Dose: Concurrent anti-coagulants: n/a Drug Interactions:  n/a  Allergies  Allergen Reactions  . Contrast Media [Iodinated Diagnostic Agents] Anaphylaxis and Shortness Of Breath  . Vancomycin Shortness Of Breath  . Vioxx [Rofecoxib] Other (See Comments)    REACTION: irregular heartbeat     Labs: Recent Labs    07/21/18 2107 07/22/18 0911  HGB 13.1  --   HCT 42.1  --   PLT 209  --   LABPROT 14.1 14.7  INR 1.09 1.16  CREATININE 0.57  --   TROPONINI <0.03  --     Medical History: Past Medical History:  Diagnosis Date  . CHF (congestive heart failure) (HCC)   . History of pulmonary embolism   . Parkinson's disease (HCC)   . Venous stasis     Assessment: Pharmacy consulted to dose warfarin for this 84 yo female with a history of PE who came to the ED with shortness of breath and sharp chest pain.  Patient has been on warfarin 2.5mg  daily, but was admitted with sub-therapeutic INR of 1.09.  Goal of Therapy:  INR 2-3 Monitor platelets by anticoagulation protocol: Yes   Plan:  Give warfarin 5mg  tonight (2x usual home dose for sub-therapeutic INR) Daily PT/INR Every other day CBC.  Thank you for allowing pharmacy to participate in this patient's care.  Tama High, Pharm. D. Clinical Pharmacist 07/22/2018 10:32 AM

## 2018-07-25 ENCOUNTER — Encounter: Payer: Self-pay | Admitting: Internal Medicine

## 2018-07-25 ENCOUNTER — Other Ambulatory Visit: Payer: Medicare Other | Admitting: Internal Medicine

## 2018-07-25 DIAGNOSIS — Z515 Encounter for palliative care: Secondary | ICD-10-CM

## 2018-07-25 NOTE — Progress Notes (Signed)
[facility] Telephone: [phone] Fax: [phone]  PATIENT NAME: [name] DOB: Apr 03, 1935 MRN: [id number]  PRIMARY CARE PROVIDER:   [name], MD  REFERRING PROVIDER:  [name], MD [address] [place], [place] [address]  Thursday Feb 6th, 1pm [phone] RESPONSIBLE PARTY:  (sister)  Quintella Reichert [phone], [phone] [facility])  IMPRESSION / RECOMMENDATIONS:  1. Functional decline; progression in [name]:Patient is resistant to take antiparkinsonian agents as she is worried that they would make her hallucinate. She did not want to consider this any further. Over this last year she has lost mobility, and is now essentially chair bound. She can transfer, and ambulate about 20 feet, with a one person heavy assist. She is occasionally incontinent of urine as a care giver isn't always able to assist her. She needs assist with hygiene, dressing, and toileting. She sleeps in her recliner and mentions occasional episodes of orthopnea. She has marked overall body stiffness with difficulty to position her body. He has high amplitude low frequency tremors bilateral UEs R > L. These have progressed over the last 6 months. She has arthritic changes and deformities of her hands L >R. She had difficulty holding her head up, and when she sits against the wall, she has a strap, that attaches to the wall, that can be positioned across her forehead to help keep her head up so she can see forward. This has caused purplish skin discoloration to her forehead, though there is no pressure injury. She has a good appetite and oral intake. She needs assist with feeding. She reports increasing difficulty with swallowing,  and episodically chokes on her food.  She is occasionally incontinent of urine due to difficulity to get to BR. She sleeps in a recliner. Episodic dyspnea and occasional orthopnea. She is A & O x 3 and pleasantly  conversant.        A. Physical Therapy consult: fitting for neck brace. Evaluate and treat gait stability and strength building.        B. Home health aid. I think this would be covered by her insurance during the time period Physical Therapy is following.  2. Goals of Care: Patient was evaluated for hospice some months ago [facility]), but I am not sure if they determined her ineligible or if she changed her mind regarding admission (she felt she did not have a good rapport with the hospice AV nurse). In any event, she has shown continued decline and I believe she would benefit from a re-evaluation. She mentions if she had Hospice support, she would wish to avoid future ER visits. We spent a bit of time discussing what hospice could offer (aid 2-3x/wk 1 hr each visit to assist with personal care, SW, Chaplain, Charity fundraiser, volunteer).       A. Hospice Consult [facility] would be her preference; [facility] also services this area). Could delay consult request pnd completion of Physical Therapy services      B. I brought up other options, such as long term facility care, but patient did not wish to discuss this at this time.  3. Advanced Care Directives:  DNR sheet in the home. Patient would wish her sister to act as her HCPOA, but hasn't spoken to her about this. Will need to f/u.  4. Home services in place:  -Patient private pays for sitters (8am-12pm and 4pm-8pm  qd) who get her up in the morning and to bed at night, in addition to person care and meals. If her sitters do not show up (which occasionally happens) she is stranded either in her recliner or chair, as she can't transfer independently. She keeps her telephone with her at all times. She mentions if her aides don't come she would start "calling around for help" but she would not share with me who specifically she might contact.  -For the last 5 months of so, patient has been enrolled in the  [place] [employer] of Health and safety inspectorHuman Services / Integrated Health Care Team. This service targets the top 100 callers that call 911/EMS. The team consists of 2 paramedics and SW/MH worker Ether Griffins(Johnny Yow [phone]) [phone], who was present at the time of my visit) to help clients establish with a PCP, and assist with obtaining services as they are able. Mr. Thurnell LoseYow explained that patient is not Medicaid eligible as she is above the financial threshold. Mr. Thurnell LoseYow mentioned that patient may be eligible for some further assistance through a Vocational Rehab program (through the American's with Disabilities Act) which has a recently added program that may assist clients to maintain independent living. He provides some assistance to help patient get to her appointments.  5. Anticoagulation for remote Pulmonary Emboli: patient is currently not taking her coumadin. She plans to f/u with decision to continue or not, pnd discussion with her PCP (apointment with in 2 days). She may not be a candidate to continue as she seems unable to travel to a clinic for PT/INR checks.   6. LE sweling: Marked LE tight swelling. On lasix in the past but no longer taking d/t limited ability to ambulate to BR.  7. PC F/U: in 2-4 weeks.   I spent 120 minutes providing this consultation,  from 1:45pm to 3:45pm. More than 50% of the time in this consultation was spent coordinating communication, reconciling patient's medication list with [facility] EMR, and interviewing care provider.   HISTORY OF PRESENT ILLNESS:  [name] is a 84 y.o. female with medical h/o Parkinson's, dCHF, remote pulmonary embolism, and venous stasis. She is s/p ER visit 07/21/2018 for atypical CP and DOE. She has had a total of 5 ER visits in the last 7 months for atypical CP. Palliative Care was asked to help address goals of care.   CODE STATUS:  DNR   PPS: 30% HOSPICE ELIGIBILITY/DIAGNOSIS: TBD  PAST MEDICAL HISTORY:  Past Medical  History:  Diagnosis Date  . CHF (congestive heart failure) (HCC)   . History of pulmonary embolism   . Parkinson's disease (HCC)   . Venous stasis     SOCIAL HX:  Social History   Tobacco Use  . Smoking status: Never Smoker  . Smokeless tobacco: Never Used  Substance Use Topics  . Alcohol use: No    ALLERGIES:  Allergies  Allergen Reactions  . Contrast Media [Iodinated Diagnostic Agents] Anaphylaxis and Shortness Of Breath  . Vancomycin Shortness Of Breath  . Vioxx [Rofecoxib] Other (See Comments)    REACTION: irregular heartbeat     PERTINENT MEDICATIONS:  Outpatient Encounter Medications as of 07/25/2018  Medication Sig  . acetaminophen (TYLENOL) 500 MG tablet Take 1,000 mg by mouth daily as needed for mild pain.  . furosemide (LASIX) 20 MG tablet Take 1 tablet (20 mg total) by mouth daily. (Patient not taking: Reported on 07/21/2018)  . potassium chloride (K-DUR)  10 MEQ tablet Take 1 tablet (10 mEq total) by mouth daily. (Patient not taking: Reported on 07/21/2018)  . warfarin (COUMADIN) 2.5 MG tablet Take 2.5 mg by mouth every evening.   No facility-administered encounter medications on file as of 07/25/2018.     PHYSICAL EXAM:  Overweight elderly female sitting up in chair against the wall. She has a head strap that attaches to the wall, to help keep her head up. She has masked facies of Parkinson's. NEURO:  Alert and oriented x 3 CARDIO: RRR without MRG PULM:  CTAB no wheezing or rhonchi GI/GU:  normal bowel sounds, non-distended, non-tender MSK/SPINE:  No gross deformities, Marked LE pitting edema to upper thighs  with chronic venous stasis erythema SKIN:  no rash, atraumatic PSYCH:  Appropriate speech and behavior  Holly Bodily NP-C [phone]

## 2018-07-27 ENCOUNTER — Telehealth: Payer: Self-pay | Admitting: Internal Medicine

## 2018-07-27 NOTE — Telephone Encounter (Signed)
8:45am TC to PCP Dr. Okey Regal office. Spoke to [facility]. Mentioned recommendation for home PT eval for neck brace and home health aide. Recommended hospice referral (pnd completion of home PT). Patient did not establish a good rapport with [facility]'s screening AV nurse. [facility]  this patient's area, as well, so referral could be sent there.  Holly Bodily NP-C [phone]

## 2018-08-01 ENCOUNTER — Ambulatory Visit: Payer: Medicare Other | Admitting: Physical Therapy

## 2018-08-25 ENCOUNTER — Telehealth: Payer: Self-pay | Admitting: Nurse Practitioner

## 2018-08-25 NOTE — Telephone Encounter (Signed)
I called [name] to schedule follow-up in-home visit and she said to call back another time.

## 2018-08-25 NOTE — Telephone Encounter (Signed)
Ms. Duro and Jonny Ruiz returned call to schedule follow-up visit for September 08, 2018 at 1pm

## 2018-09-08 ENCOUNTER — Other Ambulatory Visit: Payer: Self-pay

## 2018-09-08 ENCOUNTER — Other Ambulatory Visit: Payer: Medicare Other | Admitting: Internal Medicine

## 2018-09-08 DIAGNOSIS — Z515 Encounter for palliative care: Secondary | ICD-10-CM

## 2018-09-08 NOTE — Progress Notes (Signed)
Therapist, nutritional Palliative Care Telephone: [phone] Fax: [phone]  PATIENT NAME: [name] DOB: Aug 06, 1934 MRN: [id number]  PRIMARY CARE PROVIDER:   [name], MD  REFERRING PROVIDER:  [name], MD [address] [place], [place] [address]  Thursday Feb 6th, 1pm [phone] RESPONSIBLE PARTY:  (sister)  [name] [phone], [phone] [facility])  IMPRESSION / RECOMMENDATIONS:  1. Functional decline; progression in [name]:  INFO FROM PREVIOUS NOTE; with minor updates:  Arrived and patient questioning why I was there, what agency I was from, what I could do for her. I reminded her that I had called before visiting today, and answered her other questions. She didn't remember my visit from 07/25/2018. I reviewed my prior note with her, and she seemed reassured, but did not want to share any information with me, or have me check with her current health care providers regarding what her current situation is, or what supports she has in place. She said that if I was involved in her care, I should already know about all these things. Patient is resistant to take antiparkinsonian agents as she is worried that they would make her hallucinate. She did not want to consider this any further. Over this last year she has lost mobility, and is now essentially chair bound. She can transfer, and ambulate about 20 feet, with a one person heavy assist. Denies incontinence. She needs assist with hygiene, dressing, and toileting. She sleeps in her recliner and mentions occasional episodes of orthopnea. She has marked overall body stiffness with difficulty to position her body. He has high amplitude low frequency tremors bilateral UEs R > L. These have progressed over the last 6 months. She has arthritic changes and deformities of her hands L >R. She had difficulty holding her head up, and when she sits against  the wall, she has a strap, that attaches to the wall, that can be positioned across her forehead to help keep her head up so she can see forward. This has caused purplish skin discoloration to her forehead, though there is no pressure injury. She has a good appetite and oral intake. She needs assist with feeding. Denies difficulty with swallowing. She sleeps in a recliner. Episodic dyspnea and occasional orthopnea. She is A & O x 3 and pleasantly conversant.        A. Physical Therapy consult: Patient states she is undergoing PT, and that they are looking into supplying an aide for her.     2. Goals of Care / Advanced Care Directives: DNR is in the home. We discussed that, as she continues to decline, that she should consider seeking a HCPOA; someone she would trust to make health care decisions for her should she be unable to, herself. We also discussed that she should look into some long term facilities for care, should she be unable to continue caring for herself within the home.   3. Home services in place:  -Patient private pays for sitters (8am-12pm and 4pm-8pm qd) who get her up in the morning and to bed at night, in addition to person care and meals. If her sitters do not show up (which occasionally happens) she is stranded either in her recliner or chair, as she can't transfer independently. She keeps her telephone with her at all times. She mentions if her aides don't come she would start "calling around for help" but she would  not share with me who specifically she might contact. She is looking around for other sitters/help within the home.  -For the last 6 months of so, patient has been enrolled in the [place] [employer] of [employer] / Integrated Health Care Team. This service targets the top 100 callers that call 911/EMS. The team consists of 2 paramedics and SW/MH worker Ether Griffins [phone], who was present at the time of my visit) to help  clients establish with a PCP, and assist with obtaining services as they are able. Mr. Thurnell Lose explained that patient is not Medicaid eligible as she is above the financial threshold. Mr. Thurnell Lose mentioned that patient may be eligible for some further assistance through a Vocational Rehab program (through the American's with Disabilities Act) which has a recently added program that may assist clients to maintain independent living. He provides some assistance to help patient get to her appointments.  5. Anticoagulation for remote Pulmonary Emboli: I am not sure the current state of this, as patient did not want to update me or allow me to resource her PCP.  6. LE sweling: Improved! Decreased LE swelling; looks like just some mild swelling.   7. PC F/U: I left my name and contact number with patient, and asked her to please call me if she wished a f/u PC visit.   I spent 60 minutes providing this consultation,  from 2pm to 3pm. More than 50% of the time in this consultation was spent counseling and charting.   HISTORY OF PRESENT ILLNESS:  [name] is a 84 y.o. female with medical h/o Parkinson's, dCHF, remote pulmonary embolism, and venous stasis. She is s/p ER visit 07/21/2018 for atypical CP and DOE. She has had a total of 5 ER visits in the last 8 months for atypical CP. Palliative Care was asked to help address goals of care.   CODE STATUS:  DNR   PPS: 30% HOSPICE ELIGIBILITY/DIAGNOSIS: TBD  PAST MEDICAL HISTORY:  Past Medical History:  Diagnosis Date   CHF (congestive heart failure) (HCC)    History of pulmonary embolism    Parkinson's disease (HCC)    Venous stasis     SOCIAL HX:  Social History   Tobacco Use   Smoking status: Never Smoker   Smokeless tobacco: Never Used  Substance Use Topics   Alcohol use: No    ALLERGIES:  Allergies  Allergen Reactions   Contrast Media [Iodinated Diagnostic Agents] Anaphylaxis and Shortness Of Breath   Vancomycin Shortness  Of Breath   Vioxx [Rofecoxib] Other (See Comments)    REACTION: irregular heartbeat     PERTINENT MEDICATIONS:  Outpatient Encounter Medications as of 09/08/2018  Medication Sig   acetaminophen (TYLENOL) 500 MG tablet Take 1,000 mg by mouth daily as needed for mild pain.   furosemide (LASIX) 20 MG tablet Take 1 tablet (20 mg total) by mouth daily. (Patient not taking: Reported on 07/21/2018)   potassium chloride (K-DUR) 10 MEQ tablet Take 1 tablet (10 mEq total) by mouth daily. (Patient not taking: Reported on 07/21/2018)   warfarin (COUMADIN) 2.5 MG tablet Take 2.5 mg by mouth every evening.   No facility-administered encounter medications on file as of 09/08/2018.     PHYSICAL EXAM:  Limited physical exam as patient did not wish me to examine her.  Overweight elderly female sitting up in chair against the wall. She has a head strap that attaches to the wall, to help keep  her head up. She has masked facies of Parkinson's. NEURO: Alert and oriented x 3 EXT: Overall trunk and extremity stiffness. improved LE swelling since my last visit; now mild LE swelling SKIN: no rash exposed skin, atraumatic PSYCH: Appropriate speech and behavior Anselm Lis, NP

## 2019-01-25 ENCOUNTER — Other Ambulatory Visit (HOSPITAL_COMMUNITY)
Admission: RE | Admit: 2019-01-25 | Discharge: 2019-01-25 | Disposition: A | Discharge status: Home / Self Care | Payer: Medicare Other | Source: Ambulatory Visit | Attending: Internal Medicine | Admitting: Internal Medicine

## 2019-01-25 DIAGNOSIS — R2689 Other abnormalities of gait and mobility: Secondary | ICD-10-CM | POA: Insufficient documentation

## 2019-01-25 DIAGNOSIS — G20 Parkinson's disease: Secondary | ICD-10-CM | POA: Insufficient documentation

## 2019-01-25 DIAGNOSIS — M40292 Other kyphosis, cervical region: Secondary | ICD-10-CM | POA: Diagnosis present

## 2019-01-25 LAB — COMPREHENSIVE METABOLIC PANEL
ALT: 16 U/L (ref 0–44)
AST: 21 U/L (ref 15–41)
Albumin: 4.1 g/dL (ref 3.5–5.0)
Alkaline Phosphatase: 69 U/L (ref 38–126)
Anion gap: 8 (ref 5–15)
BUN: 20 mg/dL (ref 8–23)
CO2: 23 mmol/L (ref 22–32)
Calcium: 8.9 mg/dL (ref 8.9–10.3)
Chloride: 108 mmol/L (ref 98–111)
Creatinine, Ser: 0.63 mg/dL (ref 0.44–1.00)
GFR calc Af Amer: >60 mL/min (ref >60)
GFR calc non Af Amer: >60 mL/min (ref >60)
Glucose, Bld: 86 mg/dL (ref 70–99)
Potassium: 4.1 mmol/L (ref 3.5–5.1)
Sodium: 139 mmol/L (ref 135–145)
Total Bilirubin: 0.9 mg/dL (ref 0.3–1.2)
Total Protein: 7.2 g/dL (ref 6.5–8.1)

## 2019-01-25 LAB — CBC
HCT: 42.8 % (ref 36.0–46.0)
Hemoglobin: 13.8 g/dL (ref 12.0–15.0)
MCH: 28.4 pg (ref 26.0–34.0)
MCHC: 32.2 g/dL (ref 30.0–36.0)
MCV: 88.1 fL (ref 80.0–100.0)
Platelets: 210 10*3/uL (ref 150–400)
RBC: 4.86 MIL/uL (ref 3.87–5.11)
RDW: 13.9 % (ref 11.5–15.5)
WBC: 8.3 10*3/uL (ref 4.0–10.5)
nRBC: 0 % (ref 0.0–0.2)

## 2019-05-12 ENCOUNTER — Encounter (HOSPITAL_COMMUNITY): Payer: Self-pay | Admitting: *Deleted

## 2019-05-12 ENCOUNTER — Emergency Department (HOSPITAL_COMMUNITY)
Admission: EM | Admit: 2019-05-12 | Discharge: 2019-05-12 | Disposition: A | Discharge status: Home / Self Care | Payer: Medicare Other | Attending: Emergency Medicine | Admitting: Emergency Medicine

## 2019-05-12 ENCOUNTER — Other Ambulatory Visit: Payer: Self-pay

## 2019-05-12 DIAGNOSIS — I509 Heart failure, unspecified: Secondary | ICD-10-CM | POA: Diagnosis not present

## 2019-05-12 DIAGNOSIS — Z79899 Other long term (current) drug therapy: Secondary | ICD-10-CM | POA: Insufficient documentation

## 2019-05-12 DIAGNOSIS — R200 Anesthesia of skin: Secondary | ICD-10-CM | POA: Diagnosis not present

## 2019-05-12 DIAGNOSIS — M7989 Other specified soft tissue disorders: Secondary | ICD-10-CM

## 2019-05-12 DIAGNOSIS — R2242 Localized swelling, mass and lump, left lower limb: Secondary | ICD-10-CM | POA: Diagnosis present

## 2019-05-12 LAB — CBC WITH DIFFERENTIAL/PLATELET
Abs Immature Granulocytes: 0.02 10*3/uL (ref 0.00–0.07)
Basophils Absolute: 0 10*3/uL (ref 0.0–0.1)
Basophils Relative: 1 %
Eosinophils Absolute: 0.2 10*3/uL (ref 0.0–0.5)
Eosinophils Relative: 3 %
HCT: 42.7 % (ref 36.0–46.0)
Hemoglobin: 13.6 g/dL (ref 12.0–15.0)
Immature Granulocytes: 0 %
Lymphocytes Relative: 28 %
Lymphs Abs: 1.8 10*3/uL (ref 0.7–4.0)
MCH: 28.3 pg (ref 26.0–34.0)
MCHC: 31.9 g/dL (ref 30.0–36.0)
MCV: 88.8 fL (ref 80.0–100.0)
Monocytes Absolute: 0.7 10*3/uL (ref 0.1–1.0)
Monocytes Relative: 10 %
Neutro Abs: 3.8 10*3/uL (ref 1.7–7.7)
Neutrophils Relative %: 58 %
Platelets: 208 10*3/uL (ref 150–400)
RBC: 4.81 MIL/uL (ref 3.87–5.11)
RDW: 13.1 % (ref 11.5–15.5)
WBC: 6.5 10*3/uL (ref 4.0–10.5)
nRBC: 0 % (ref 0.0–0.2)

## 2019-05-12 LAB — BASIC METABOLIC PANEL
Anion gap: 8 (ref 5–15)
BUN: 15 mg/dL (ref 8–23)
CO2: 21 mmol/L — ABNORMAL LOW (ref 22–32)
Calcium: 8.7 mg/dL — ABNORMAL LOW (ref 8.9–10.3)
Chloride: 108 mmol/L (ref 98–111)
Creatinine, Ser: 0.61 mg/dL (ref 0.44–1.00)
GFR calc Af Amer: >60 mL/min (ref >60)
GFR calc non Af Amer: >60 mL/min (ref >60)
Glucose, Bld: 97 mg/dL (ref 70–99)
Potassium: 4 mmol/L (ref 3.5–5.1)
Sodium: 137 mmol/L (ref 135–145)

## 2019-05-12 LAB — PROTIME-INR
INR: 1.1 (ref 0.8–1.2)
Prothrombin Time: 14.3 seconds (ref 11.4–15.2)

## 2019-05-12 MED ORDER — CLINDAMYCIN HCL 150 MG PO CAPS
300.0000 mg | ORAL_CAPSULE | Freq: Once | ORAL | Status: DC
  Filled 2019-05-12: qty 2

## 2019-05-12 MED ORDER — CLINDAMYCIN HCL 300 MG PO CAPS: 300.0000 mg | ORAL_CAPSULE | Freq: Four times a day (QID) | ORAL | 0 refills | Status: DC

## 2019-05-12 MED ORDER — RIVAROXABAN 15 MG PO TABS
15.0000 mg | ORAL_TABLET | Freq: Once | ORAL | Status: DC
  Filled 2019-05-12: qty 1

## 2019-05-12 NOTE — ED Notes (Signed)
RN spoke with Pts sister Laureen Ochs over the phone. Sister advised RN that the Pt likes to be very private and that it was the pts private caretaker today that advised they call EMS to have Pt brought to [facility] for evaluation of left lower leg numbness. Upon assessment, Pt is noted to have redness and swelling to bilateral lower extremities, but Pt reports sensation in left leg. Pts sister reports Pts kyphosis is severe and Pt sleeps at home in recliner and caretakers at home place Velcro strap around Pts forehead to hold Pts head back when asleep. Per sister, Pt does not like taking medications and has refused medications in the past. MD Notified.

## 2019-05-12 NOTE — ED Triage Notes (Addendum)
Pt brought in by [facility] with c/o left leg numbness and pain that started today. Pt has swelling and redness to bilateral legs.

## 2019-05-12 NOTE — ED Notes (Signed)
Per conversation with Pts sister and Eulis Foster, MD, caretaker Elicia Lamp [phone]) will meet Pt at home to receive Pt from [facility] following discharge and transportation home. Family and Pt advised they will need to follow-up tomorrow for out-patient DVT Study.

## 2019-05-12 NOTE — ED Notes (Addendum)
Pt hollering out from room. Went in again to see if I can help. Pt head has been adjusted several times for comfort my RN and NT. Pt has removed O2 sensor. Upon trying to place back on finger pt said No and scratched me. RN notified.

## 2019-05-12 NOTE — ED Notes (Signed)
Pt refusing all medication at this time. MD Notified. Pt states she will follow-up with her PCP Dr. Nevada Crane regarding the medications.

## 2019-05-12 NOTE — Discharge Instructions (Signed)
Elevate your legs above your heart with them straight as much as possible.  Return tomorrow morning for the ultrasound of your left leg to be evaluated for a DVT.  Take the antibiotic prescribed for a possible infection of your lower leg contributing to swelling.

## 2019-05-12 NOTE — ED Provider Notes (Signed)
[facility] EMERGENCY DEPARTMENT Provider Note   CSN: [id number] Arrival date & time: 05/12/19  1633     History   Chief Complaint Chief Complaint  Patient presents with  . Leg Swelling    HPI [name] is a 84 y.o. female.     HPI   She presents from home for evaluation of bilateral leg swelling with redness.  She is also complaining of numbness in her left leg with pain that started today.  She denies fever, chills, shortness of breath, chest pain, weakness or dizziness.  No recent fall.  No known trauma.  There are no other known modifying factors.  Past Medical History:  Diagnosis Date  . CHF (congestive heart failure) (HCC)   . History of pulmonary embolism   . Parkinson's disease (HCC)   . Venous stasis     Patient Active Problem List   Diagnosis Date Noted  . Goals of care, counseling/discussion   . Palliative care by specialist   . DNR (do not resuscitate) discussion   . Coagulopathy (HCC) 03/19/2018  . Elevated INR   . Supratherapeutic INR 02/24/2018  . Physical debility 02/24/2018  . Folliculitis of perineum 12/15/2017  . Streptococcal bacteremia 07/09/2017  . Cellulitis of lower extremity   . Bacteremia due to Gram-positive bacteria 07/06/2017  . Cellulitis and abscess of left leg 07/05/2017  . Vulvar abscess 07/05/2017  . Infected cyst of Bartholin's gland duct 07/05/2017  . Weakness 12/21/2014  . Leg weakness 12/20/2014  . Lower extremity weakness 12/20/2014  . Precordial pain 12/20/2014  . Diarrhea   . Chest pain 11/28/2014  . Pressure ulcer, heel 11/28/2014  . AKI (acute kidney injury) (HCC) 11/28/2014  . Chronic diastolic congestive heart failure (HCC) 11/28/2014  . Parkinson's disease (HCC) 11/28/2014  . Pain in the chest   . Encounter for therapeutic drug monitoring 09/15/2013  . Long term (current) use of anticoagulants 09/22/2010  . Pulmonary embolism (HCC) 08/25/2010  . Generalized weakness 08/16/2009  . LEG EDEMA 08/16/2009   . Chronic diastolic CHF (congestive heart failure) (HCC) 08/16/2009  . Venous (peripheral) insufficiency 11/15/2008  . PULMONARY EMBOLISM, HX OF 11/15/2008    History reviewed. No pertinent surgical history.   OB History   No obstetric history on file.      Home Medications    Prior to Admission medications   Medication Sig Start Date End Date Taking? Authorizing Provider  acetaminophen (TYLENOL) 500 MG tablet Take 1,000 mg by mouth daily as needed for mild pain.    [provider]  furosemide (LASIX) 20 MG tablet Take 1 tablet (20 mg total) by mouth daily. Patient not taking: Reported on 07/21/2018 07/01/18 07/01/19  Erick Blinks, MD  potassium chloride (K-DUR) 10 MEQ tablet Take 1 tablet (10 mEq total) by mouth daily. Patient not taking: Reported on 07/21/2018 07/01/18   Erick Blinks, MD  warfarin (COUMADIN) 2.5 MG tablet Take 2.5 mg by mouth every evening.    [provider]    Family History Family History  Problem Relation Age of Onset  . Heart disease Mother        80s    Social History Social History   Tobacco Use  . Smoking status: Never Smoker  . Smokeless tobacco: Never Used  Substance Use Topics  . Alcohol use: No  . Drug use: No     Allergies   Contrast media [iodinated diagnostic agents], Vancomycin, and Vioxx [rofecoxib]   Review of Systems Review of Systems  All other systems reviewed and are negative.    Physical Exam Updated Vital Signs BP (!) 155/84 (BP Location: Right Arm)   Pulse 79   Temp 98.8 F (37.1 C) (Oral)   Resp 18   Ht 5\' 4"  (1.626 m)   Wt 66.6 kg   SpO2 97%   BMI 25.20 kg/m   Physical Exam Vitals signs and nursing note reviewed.  Constitutional:      General: She is not in acute distress.    Appearance: She is well-developed. She is not ill-appearing, toxic-appearing or diaphoretic.     Comments: Frail, elderly  HENT:     Head: Normocephalic and atraumatic.     Right Ear: External ear normal.      Left Ear: External ear normal.  Eyes:     Conjunctiva/sclera: Conjunctivae normal.     Pupils: Pupils are equal, round, and reactive to light.  Neck:     Musculoskeletal: Normal range of motion and neck supple.     Trachea: Phonation normal.  Cardiovascular:     Rate and Rhythm: Normal rate and regular rhythm.     Heart sounds: Normal heart sounds.  Pulmonary:     Effort: Pulmonary effort is normal.     Breath sounds: Normal breath sounds.  Abdominal:     General: There is no distension.     Palpations: Abdomen is soft.     Tenderness: There is no abdominal tenderness.  Musculoskeletal:     Comments: Cervical spine is flexed near to the chest, chronic fashion.  Neck is nontender to palpation.  Normal range of motion arms.  No deformity.  Legs with bilateral swelling, left greater than right and mild erythema of the anterior shins, bilaterally.  There are no associated areas of fluctuance, drainage or bleeding.  Skin:    General: Skin is warm and dry.     Comments: Leg skin exam described under musculoskeletal.  Neurological:     Mental Status: She is alert and oriented to person, place, and time.     Cranial Nerves: No cranial nerve deficit.     Sensory: No sensory deficit.     Motor: No abnormal muscle tone.     Coordination: Coordination normal.  Psychiatric:        Mood and Affect: Mood normal.        Behavior: Behavior normal.      ED Treatments / Results  Labs (all labs ordered are listed, but only abnormal results are displayed) Labs Reviewed - No data to display  EKG None  Radiology No results found.  Procedures Procedures (including critical care time)  Medications Ordered in ED Medications - No data to display   Initial Impression / Assessment and Plan / ED Course  I have reviewed the triage vital signs and the nursing notes.  Pertinent labs & imaging results that were available during my care of the patient were reviewed by me and considered in my  medical decision making (see chart for details).  Clinical Course as of May 14 1016  Mon May 15, 2019  1016 Anion gap: 8 [EW]    Clinical Course User Index [EW] Daleen Bo, MD   I discussed the situation with the patient's sister who lives in [place].  She is in close contact with her sister, daily by telephone. The patient is assisted by caregivers who are in and out most of the day but she maintains her self overnight.  The patient's sister is in  agreement with her being discharged with outpatient follow-up for further evaluation and treatment.  ED course the patient was offered clindamycin, and Xarelto, both of which she refused.  This despite after multiple encouragements explanation, to get her to take the medication.  She prefers following up with her PCP for further evaluation and treatment.  Outpatient order to evaluate for left leg DVT, has been submitted.   No data found.   Medical Decision Making: Lower leg swelling of mild erythema.  Doubt cellulitis.  Differential diagnosis included venous thromboembolism of the left leg.  Doubt lupus.  Doubt metabolic instability or impending vascular collapse.  CRITICAL CARE- no Performed by: Mancel BaleElliott Naava Janeway  Nursing Notes Reviewed/ Care Coordinated Applicable Imaging Reviewed Interpretation of Laboratory Data incorporated into ED treatment  The patient appears reasonably screened and/or stabilized for discharge and I doubt any other medical condition or other [facility] requiring further screening, evaluation, or treatment in the ED at this time prior to discharge.  Plan: Home Medications-continue usual medications; Home Treatments-elevate legs above heart; return here if the recommended treatment, does not improve the symptoms; Recommended follow up-return tomorrow morning for DVT imaging of the left lower leg.  Prescription given for clindamycin to take, to treat possible early cellulitis of the legs.   Final Clinical  Impressions(s) / ED Diagnoses   Final diagnoses:  Left leg swelling    ED Discharge Orders         Ordered    clindamycin (CLEOCIN) 300 MG capsule  4 times daily     05/12/19 2235    US Venous Img Lower Unilateral Left     05/12/19 2237           Mancel BaleWentz, Lucyann Romano, MD 05/15/19 1019

## 2019-06-17 ENCOUNTER — Emergency Department (HOSPITAL_COMMUNITY): Payer: Medicare Other

## 2019-06-17 ENCOUNTER — Encounter (HOSPITAL_COMMUNITY): Payer: Self-pay

## 2019-06-17 ENCOUNTER — Emergency Department (HOSPITAL_COMMUNITY)
Admission: EM | Admit: 2019-06-17 | Discharge: 2019-06-17 | Disposition: A | Discharge status: Home / Self Care | Payer: Medicare Other | Attending: Emergency Medicine | Admitting: Emergency Medicine

## 2019-06-17 ENCOUNTER — Other Ambulatory Visit: Payer: Self-pay

## 2019-06-17 DIAGNOSIS — R0602 Shortness of breath: Secondary | ICD-10-CM

## 2019-06-17 DIAGNOSIS — Z7901 Long term (current) use of anticoagulants: Secondary | ICD-10-CM | POA: Diagnosis not present

## 2019-06-17 DIAGNOSIS — U071 COVID-19: Secondary | ICD-10-CM | POA: Diagnosis not present

## 2019-06-17 DIAGNOSIS — I509 Heart failure, unspecified: Secondary | ICD-10-CM | POA: Insufficient documentation

## 2019-06-17 DIAGNOSIS — G20 Parkinson's disease: Secondary | ICD-10-CM | POA: Diagnosis not present

## 2019-06-17 LAB — BASIC METABOLIC PANEL
Anion gap: 11 (ref 5–15)
BUN: 13 mg/dL (ref 8–23)
CO2: 24 mmol/L (ref 22–32)
Calcium: 8.5 mg/dL — ABNORMAL LOW (ref 8.9–10.3)
Chloride: 105 mmol/L (ref 98–111)
Creatinine, Ser: 0.63 mg/dL (ref 0.44–1.00)
GFR calc Af Amer: >60 mL/min (ref >60)
GFR calc non Af Amer: >60 mL/min (ref >60)
Glucose, Bld: 81 mg/dL (ref 70–99)
Potassium: 3.8 mmol/L (ref 3.5–5.1)
Sodium: 140 mmol/L (ref 135–145)

## 2019-06-17 LAB — CBC WITH DIFFERENTIAL/PLATELET
Abs Immature Granulocytes: 0.01 10*3/uL (ref 0.00–0.07)
Basophils Absolute: 0 10*3/uL (ref 0.0–0.1)
Basophils Relative: 1 %
Eosinophils Absolute: 0.1 10*3/uL (ref 0.0–0.5)
Eosinophils Relative: 2 %
HCT: 45.3 % (ref 36.0–46.0)
Hemoglobin: 14.2 g/dL (ref 12.0–15.0)
Immature Granulocytes: 0 %
Lymphocytes Relative: 19 %
Lymphs Abs: 1.2 10*3/uL (ref 0.7–4.0)
MCH: 28.1 pg (ref 26.0–34.0)
MCHC: 31.3 g/dL (ref 30.0–36.0)
MCV: 89.7 fL (ref 80.0–100.0)
Monocytes Absolute: 0.5 10*3/uL (ref 0.1–1.0)
Monocytes Relative: 8 %
Neutro Abs: 4.5 10*3/uL (ref 1.7–7.7)
Neutrophils Relative %: 70 %
Platelets: 210 10*3/uL (ref 150–400)
RBC: 5.05 MIL/uL (ref 3.87–5.11)
RDW: 13.3 % (ref 11.5–15.5)
WBC: 6.3 10*3/uL (ref 4.0–10.5)
nRBC: 0 % (ref 0.0–0.2)

## 2019-06-17 LAB — D-DIMER, QUANTITATIVE: D-Dimer, Quant: 9.44 ug/mL-FEU — ABNORMAL HIGH (ref 0.00–0.50)

## 2019-06-17 LAB — PROTIME-INR
INR: 1.2 (ref 0.8–1.2)
Prothrombin Time: 15.1 seconds (ref 11.4–15.2)

## 2019-06-17 LAB — TROPONIN I (HIGH SENSITIVITY): Troponin I (High Sensitivity): 6 ng/L (ref <18)

## 2019-06-17 MED ORDER — RIVAROXABAN (XARELTO) VTE STARTER PACK (15 & 20 MG): ORAL_TABLET | ORAL | 0 refills | Status: DC

## 2019-06-17 MED ORDER — FUROSEMIDE 40 MG PO TABS
40.0000 mg | ORAL_TABLET | Freq: Once | ORAL | Status: AC
  Administered 2019-06-17: 40 mg via ORAL
  Filled 2019-06-17: qty 1

## 2019-06-17 MED ORDER — RIVAROXABAN 15 MG PO TABS
15.0000 mg | ORAL_TABLET | Freq: Once | ORAL | Status: AC
  Administered 2019-06-17: 16:00:00 15 mg via ORAL
  Filled 2019-06-17 (×2): qty 1

## 2019-06-17 NOTE — ED Notes (Signed)
ED Provider at bedside. 

## 2019-06-17 NOTE — ED Notes (Signed)
Pt ate one saltine cracker and tolerated well. Dr Sabra Heck made aware.

## 2019-06-17 NOTE — ED Provider Notes (Signed)
[facility] EMERGENCY DEPARTMENT Provider Note   CSN: [id number] Arrival date & time: 06/17/19  1053     History Chief Complaint  Patient presents with  . Shortness of Breath    [name] is a 84 y.o. female.  HPI   This patient is a 84 year old female with a known history of congestive heart failure as well as history of pulmonary embolism, Parkinson's disease, and severe kyphosis.  She had been seen recently in November for a leg complaint of some swelling and redness.  The patient was offered a course of clindamycin and Xarelto both of which the patient had refused, she did not show for her follow-up ultrasound  She has some element of chronic shortness of breath due to her severe kyphosis, her chin is nearly always on her chest and she states due to Parkinson's disease she cannot hold her head up.  She actually has a contraption at home that is able to keep her head up in the air, it is attached to the wall and works like a headband that keeps her head off of her chest.  She denies any significant coughing or fever but states that the shortness of breath seems to come and go, when she walks with her walker she gets short of breath a little easier than usual.  She states that her legs have not been unusually swollen over the last week.  She has not been seen for this complaint up to this point.  Past Medical History:  Diagnosis Date  . CHF (congestive heart failure) (HCC)   . History of pulmonary embolism   . Parkinson's disease (HCC)   . Venous stasis     Patient Active Problem List   Diagnosis Date Noted  . Goals of care, counseling/discussion   . Palliative care by specialist   . DNR (do not resuscitate) discussion   . Coagulopathy (HCC) 03/19/2018  . Elevated INR   . Supratherapeutic INR 02/24/2018  . Physical debility 02/24/2018  . Folliculitis of perineum 12/15/2017  . Streptococcal bacteremia 07/09/2017  . Cellulitis of lower extremity   . Bacteremia due to  Gram-positive bacteria 07/06/2017  . Cellulitis and abscess of left leg 07/05/2017  . Vulvar abscess 07/05/2017  . Infected cyst of Bartholin's gland duct 07/05/2017  . Weakness 12/21/2014  . Leg weakness 12/20/2014  . Lower extremity weakness 12/20/2014  . Precordial pain 12/20/2014  . Diarrhea   . Chest pain 11/28/2014  . Pressure ulcer, heel 11/28/2014  . AKI (acute kidney injury) (HCC) 11/28/2014  . Chronic diastolic congestive heart failure (HCC) 11/28/2014  . Parkinson's disease (HCC) 11/28/2014  . Pain in the chest   . Encounter for therapeutic drug monitoring 09/15/2013  . Long term (current) use of anticoagulants 09/22/2010  . Pulmonary embolism (HCC) 08/25/2010  . Generalized weakness 08/16/2009  . LEG EDEMA 08/16/2009  . Chronic diastolic CHF (congestive heart failure) (HCC) 08/16/2009  . Venous (peripheral) insufficiency 11/15/2008  . PULMONARY EMBOLISM, HX OF 11/15/2008    History reviewed. No pertinent surgical history.   OB History   No obstetric history on file.     Family History  Problem Relation Age of Onset  . Heart disease Mother        6190s    Social History   Tobacco Use  . Smoking status: Never Smoker  . Smokeless tobacco: Never Used  Substance Use Topics  . Alcohol use: No  . Drug use: No    Home Medications Prior to  Admission medications   Medication Sig Start Date End Date Taking? Authorizing Provider  acetaminophen (TYLENOL) 500 MG tablet Take 1,000 mg by mouth daily as needed for mild pain.   Yes [provider]  clindamycin (CLEOCIN) 300 MG capsule Take 1 capsule (300 mg total) by mouth 4 (four) times daily. X 7 days Patient not taking: Reported on 06/17/2019 05/12/19   Mancel Bale, MD  furosemide (LASIX) 20 MG tablet Take 1 tablet (20 mg total) by mouth daily. Patient not taking: Reported on 06/17/2019 07/01/18 07/01/19  Erick Blinks, MD  potassium chloride (K-DUR) 10 MEQ tablet Take 1 tablet (10 mEq total) by mouth  daily. Patient not taking: Reported on 06/17/2019 07/01/18   Erick Blinks, MD  Rivaroxaban 15 & 20 MG TBPK Follow package directions: Take one 15mg  tablet by mouth twice a day. On day 22, switch to one 20mg  tablet once a day. Take with food. 06/17/19   , MD  warfarin (COUMADIN) 2.5 MG tablet Take 2.5 mg by mouth every evening.    [provider]    Allergies    Contrast media [iodinated diagnostic agents], Vancomycin, and Vioxx [rofecoxib]  Review of Systems   Review of Systems  All other systems reviewed and are negative.   Physical Exam Updated Vital Signs BP (!) 145/77   Pulse 78   Temp 98.5 F (36.9 C) (Rectal)   Resp 18   Ht 1.549 m (5\' 1" )   Wt 54.4 kg   SpO2 100%   BMI 22.67 kg/m   Physical Exam Vitals and nursing note reviewed.  Constitutional:      General: She is not in acute distress.    Appearance: She is well-developed.  HENT:     Head: Normocephalic and atraumatic.     Mouth/Throat:     Pharynx: No oropharyngeal exudate.  Eyes:     General: No scleral icterus.       Right eye: No discharge.        Left eye: No discharge.     Conjunctiva/sclera: Conjunctivae normal.     Pupils: Pupils are equal, round, and reactive to light.  Neck:     Thyroid: No thyromegaly.     Vascular: No JVD.     Comments: Severe kyphosis with her chin nearly resting on her chest Cardiovascular:     Rate and Rhythm: Normal rate and regular rhythm.     Heart sounds: Normal heart sounds. No murmur. No friction rub. No gallop.   Pulmonary:     Effort: Pulmonary effort is normal. No respiratory distress.     Breath sounds: Normal breath sounds. No wheezing or rales.     Comments: Oxygenation at 98% on room air, her lung sounds are overall clear and she speaks in her normal sentences Abdominal:     General: Bowel sounds are normal. There is no distension.     Palpations: Abdomen is soft. There is no mass.     Tenderness: There is no abdominal tenderness.    Musculoskeletal:        General: No tenderness. Normal range of motion.     Cervical back: Normal range of motion and neck supple.  Lymphadenopathy:     Cervical: No cervical adenopathy.  Skin:    General: Skin is warm and dry.     Findings: Erythema present. No rash.     Comments: Minimal erythema to the bilateral lower extremities consistent with vascular insufficiency and venous stasis, no significant edema  Neurological:  Mental Status: She is alert.     Coordination: Coordination normal.     Comments: Bilateral tremor of the upper extremities, the patient's neck is almost flaccid, she is not able to hold her neck up, this is chronic  Psychiatric:        Behavior: Behavior normal.     ED Results / Procedures / Treatments   Labs (all labs ordered are listed, but only abnormal results are displayed) Labs Reviewed  BASIC METABOLIC PANEL - Abnormal; Notable for the following components:      Result Value   Calcium 8.5 (*)    All other components within normal limits  D-DIMER, QUANTITATIVE (NOT AT [facility]) - Abnormal; Notable for the following components:   D-Dimer, Quant 9.44 (*)    All other components within normal limits  SARS CORONAVIRUS 2 (TAT 6-24 HRS)  CBC WITH DIFFERENTIAL/PLATELET  PROTIME-INR  TROPONIN I (HIGH SENSITIVITY)    EKG EKG Interpretation  Date/Time:  Saturday June 17 2019 11:05:23 EST Ventricular Rate:  97 PR Interval:    QRS Duration: 137 QT Interval:  420 QTC Calculation: 534 R Axis:   -65 Text Interpretation: Atrial flutter/fibrillation IVCD, consider atypical RBBB LVH with IVCD and secondary repol abnrm Prolonged QT interval Since last tracing rate faster Confirmed by Noemi Chapel [phone]) on 06/17/2019 11:23:40 AM   Radiology DG Chest Port 1 View  Result Date: 06/17/2019 CLINICAL DATA:  Shortness of breath with productive cough for 3 days. EXAM: PORTABLE CHEST 1 VIEW COMPARISON:  July 21, 2018 FINDINGS: The mediastinal contour is  normal. The heart size is enlarged. Evaluation of the left lung base is limited due to overlying hand. The lungs are probably clear. The visualized skeletal structures are unremarkable. IMPRESSION: 1. The lungs are probably clear. 2. Evaluation of the left lung base is limited due to overlying hand. Electronically Signed   By: Abelardo Diesel M.D.   On: 06/17/2019 12:06    Procedures Procedures (including critical care time)  Medications Ordered in ED Medications  Rivaroxaban (XARELTO) tablet 15 mg (has no administration in time range)  furosemide (LASIX) tablet 40 mg (40 mg Oral Given 06/17/19 1513)    ED Course  I have reviewed the triage vital signs and the nursing notes.  Pertinent labs & imaging results that were available during my care of the patient were reviewed by me and considered in my medical decision making (see chart for details).  Clinical Course as of Jun 17 1543  Sat Jun 17, 2019  1505 At this time I do not see any evidence of acute myocardial infarction,    [BM]    Clinical Course User Index [BM] Noemi Chapel, MD   MDM Rules/Calculators/A&P                      The patient is chronically deconditioned and has advancing Parkinson's disease.  She requires significant assistance at home, will check an x-ray to make sure there is no signs of acute infiltrate, she certainly could be at risk for pulmonary embolism, her EKG shows left ventricular hypertrophy but no signs of ST elevation or depression or arrhythmia.  She does not appear unstable at this time  I have reviewed this patient's labs, studies, x-ray and troponin as well as the EKG.  She has no acute findings to suggest a pathologic cause of her symptoms, however she does have a history of venous thromboembolism and given the elevated D-dimer and the feeling of shortness of  breath I felt compelled to discuss the treatment options with the patient since she cannot undergo CT scan and we do not have VQ scan at this  time.  I recommended that the patient go on to an oral anticoagulant, she prefers warfarin however she does not have the ability to give herself Lovenox injections over the next 5 days that she was amenable to treatment with Xarelto.  The patient then complained about not being able to swallow correctly, having diarrhea intermittently over the last month, feeling intermittently short of breath.  At this time the patient has been informed of all of her results, she is not currently having diarrhea, she is been able to pass a p.o. trial in the emergency department with crackers and water and has taken her first dose of medication.  She is stable for discharge, she is agreeable to the plan.  She has a family doctor with whom she can follow-up.  Final Clinical Impression(s) / ED Diagnoses Final diagnoses:  SOB (shortness of breath)    Rx / DC Orders ED Discharge Orders         Ordered    Rivaroxaban 15 & 20 MG TBPK     06/17/19 1542           Eber Hong, MD 06/17/19 1544

## 2019-06-17 NOTE — Discharge Instructions (Signed)
Your testing today shows that there is a possibility that you may have a blood clot.  There was no signs of pneumonia, your blood work was normal except for the blood clot test.  The appropriate test would be a CAT scan which you cannot have, the next best test would be called a VQ scan.  You will need to have your family doctor order this.  Because you have had a blood clot in the past I have encouraged you to take the medication called Xarelto.  This is a blood thinner that you would take in place of Coumadin.  It is taken as follows  Take one 15 mg tablet by mouth twice a day for 3 weeks then switch to one 20 mg tablet daily.  You must follow-up with your family doctor within 48 hours for recheck.  They may need to order the VQ scan to confirm the diagnosis.  Please return to the emergency department for any severe or worsening symptoms including increasing chest pain shortness of breath fevers chills or any concerning, severe or worsening symptoms.

## 2019-06-17 NOTE — ED Triage Notes (Signed)
Pt presents to ED with increased SOB x 2-3 days. Pt reports productive cough, denies fever.

## 2019-06-18 LAB — SARS CORONAVIRUS 2 (TAT 6-24 HRS): SARS Coronavirus 2: POSITIVE — AB

## 2019-06-19 ENCOUNTER — Telehealth: Payer: Self-pay | Admitting: Nurse Practitioner

## 2019-06-19 NOTE — Telephone Encounter (Signed)
Called to Discuss with patient about Covid symptoms and the use of bamlanivimab, a monoclonal antibody infusion for those with mild to moderate Covid symptoms and at a high risk of hospitalization.     Pt is qualified for this infusion at the [facility] infusion center due to co-morbid conditions and/or a member of an at-risk group.    Patient Active Problem List   Diagnosis Date Noted  . Goals of care, counseling/discussion   . Palliative care by specialist   . DNR (do not resuscitate) discussion   . Coagulopathy ([place]) 03/19/2018  . Elevated INR   . Supratherapeutic INR 02/24/2018  . Physical debility 02/24/2018  . Folliculitis of perineum 12/15/2017  . Streptococcal bacteremia 07/09/2017  . Cellulitis of lower extremity   . Bacteremia due to Gram-positive bacteria 07/06/2017  . Cellulitis and abscess of left leg 07/05/2017  . Vulvar abscess 07/05/2017  . Infected cyst of Bartholin's gland duct 07/05/2017  . Weakness 12/21/2014  . Leg weakness 12/20/2014  . Lower extremity weakness 12/20/2014  . Precordial pain 12/20/2014  . Diarrhea   . Chest pain 11/28/2014  . Pressure ulcer, heel 11/28/2014  . AKI (acute kidney injury) ([place]) 11/28/2014  . Chronic diastolic congestive heart failure ([place]) 11/28/2014  . Parkinson's disease ([place]) 11/28/2014  . Pain in the chest   . Encounter for therapeutic drug monitoring 09/15/2013  . Long term (current) use of anticoagulants 09/22/2010  . Pulmonary embolism ([place]) 08/25/2010  . Generalized weakness 08/16/2009  . LEG EDEMA 08/16/2009  . Chronic diastolic CHF (congestive heart failure) ([place]) 08/16/2009  . Venous (peripheral) insufficiency 11/15/2008  . PULMONARY EMBOLISM, HX OF 11/15/2008    Patient states that she is better and no longer has any symptoms.

## 2019-07-13 ENCOUNTER — Encounter (HOSPITAL_COMMUNITY): Payer: Self-pay | Admitting: Emergency Medicine

## 2019-07-13 ENCOUNTER — Inpatient Hospital Stay (HOSPITAL_COMMUNITY)
Admission: EM | Admit: 2019-07-13 | Discharge: 2019-07-17 | DRG: 948 | Disposition: A | Discharge status: Skilled Nursing Facility | Payer: Medicare Other | Attending: Family Medicine | Admitting: Family Medicine

## 2019-07-13 ENCOUNTER — Emergency Department (HOSPITAL_COMMUNITY): Payer: Medicare Other

## 2019-07-13 ENCOUNTER — Other Ambulatory Visit: Payer: Self-pay

## 2019-07-13 DIAGNOSIS — N3001 Acute cystitis with hematuria: Secondary | ICD-10-CM | POA: Diagnosis not present

## 2019-07-13 DIAGNOSIS — Z881 Allergy status to other antibiotic agents status: Secondary | ICD-10-CM

## 2019-07-13 DIAGNOSIS — R262 Difficulty in walking, not elsewhere classified: Secondary | ICD-10-CM

## 2019-07-13 DIAGNOSIS — Z66 Do not resuscitate: Secondary | ICD-10-CM | POA: Diagnosis present

## 2019-07-13 DIAGNOSIS — Z886 Allergy status to analgesic agent status: Secondary | ICD-10-CM

## 2019-07-13 DIAGNOSIS — R5381 Other malaise: Principal | ICD-10-CM | POA: Diagnosis present

## 2019-07-13 DIAGNOSIS — Z86718 Personal history of other venous thrombosis and embolism: Secondary | ICD-10-CM

## 2019-07-13 DIAGNOSIS — M6282 Rhabdomyolysis: Secondary | ICD-10-CM | POA: Diagnosis present

## 2019-07-13 DIAGNOSIS — W19XXXA Unspecified fall, initial encounter: Secondary | ICD-10-CM

## 2019-07-13 DIAGNOSIS — Z8249 Family history of ischemic heart disease and other diseases of the circulatory system: Secondary | ICD-10-CM

## 2019-07-13 DIAGNOSIS — Z7189 Other specified counseling: Secondary | ICD-10-CM

## 2019-07-13 DIAGNOSIS — G20 Parkinson's disease: Secondary | ICD-10-CM | POA: Diagnosis present

## 2019-07-13 DIAGNOSIS — Z7901 Long term (current) use of anticoagulants: Secondary | ICD-10-CM

## 2019-07-13 DIAGNOSIS — R531 Weakness: Secondary | ICD-10-CM

## 2019-07-13 DIAGNOSIS — R52 Pain, unspecified: Secondary | ICD-10-CM

## 2019-07-13 DIAGNOSIS — Z91041 Radiographic dye allergy status: Secondary | ICD-10-CM

## 2019-07-13 DIAGNOSIS — Z8616 Personal history of COVID-19: Secondary | ICD-10-CM

## 2019-07-13 DIAGNOSIS — Z86711 Personal history of pulmonary embolism: Secondary | ICD-10-CM

## 2019-07-13 DIAGNOSIS — M25561 Pain in right knee: Secondary | ICD-10-CM

## 2019-07-13 DIAGNOSIS — I5032 Chronic diastolic (congestive) heart failure: Secondary | ICD-10-CM | POA: Diagnosis present

## 2019-07-13 LAB — BASIC METABOLIC PANEL
Anion gap: 9 (ref 5–15)
BUN: 14 mg/dL (ref 8–23)
CO2: 23 mmol/L (ref 22–32)
Calcium: 8.5 mg/dL — ABNORMAL LOW (ref 8.9–10.3)
Chloride: 108 mmol/L (ref 98–111)
Creatinine, Ser: 0.49 mg/dL (ref 0.44–1.00)
GFR calc Af Amer: >60 mL/min (ref >60)
GFR calc non Af Amer: >60 mL/min (ref >60)
Glucose, Bld: 115 mg/dL — ABNORMAL HIGH (ref 70–99)
Potassium: 3.7 mmol/L (ref 3.5–5.1)
Sodium: 140 mmol/L (ref 135–145)

## 2019-07-13 LAB — URINALYSIS, ROUTINE W REFLEX MICROSCOPIC
Bilirubin Urine: NEGATIVE
Glucose, UA: NEGATIVE mg/dL
Ketones, ur: NEGATIVE mg/dL
Leukocytes,Ua: NEGATIVE
Nitrite: NEGATIVE
Protein, ur: 30 mg/dL — AB
RBC / HPF: >50 RBC/hpf — ABNORMAL HIGH (ref 0–5)
Specific Gravity, Urine: 1.026 (ref 1.005–1.030)
pH: 5 (ref 5.0–8.0)

## 2019-07-13 LAB — CBC
HCT: 42.7 % (ref 36.0–46.0)
Hemoglobin: 13.4 g/dL (ref 12.0–15.0)
MCH: 28.1 pg (ref 26.0–34.0)
MCHC: 31.4 g/dL (ref 30.0–36.0)
MCV: 89.5 fL (ref 80.0–100.0)
Platelets: 203 10*3/uL (ref 150–400)
RBC: 4.77 MIL/uL (ref 3.87–5.11)
RDW: 14.4 % (ref 11.5–15.5)
WBC: 8.9 10*3/uL (ref 4.0–10.5)
nRBC: 0 % (ref 0.0–0.2)

## 2019-07-13 LAB — CK: Total CK: 514 U/L — ABNORMAL HIGH (ref 38–234)

## 2019-07-13 MED ORDER — SODIUM CHLORIDE 0.9 % IV BOLUS
1000.0000 mL | Freq: Once | INTRAVENOUS | Status: AC
  Administered 2019-07-13: 1000 mL via INTRAVENOUS

## 2019-07-13 MED ORDER — SODIUM CHLORIDE 0.9 % IV SOLN
1.0000 g | Freq: Once | INTRAVENOUS | Status: AC
  Administered 2019-07-14: 1 g via INTRAVENOUS
  Filled 2019-07-13: qty 10

## 2019-07-13 MED ORDER — ACETAMINOPHEN 325 MG PO TABS
650.0000 mg | ORAL_TABLET | Freq: Four times a day (QID) | ORAL | Status: DC | PRN
  Administered 2019-07-14 – 2019-07-16 (×3): 650 mg via ORAL
  Filled 2019-07-13 (×3): qty 2

## 2019-07-13 MED ORDER — SODIUM CHLORIDE 0.9 % IV SOLN: INTRAVENOUS | Status: DC

## 2019-07-13 MED ORDER — SODIUM CHLORIDE 0.9 % IV SOLN
1.0000 g | Freq: Once | INTRAVENOUS | Status: AC
  Administered 2019-07-13: 1 g via INTRAVENOUS
  Filled 2019-07-13: qty 10

## 2019-07-13 MED ORDER — ACETAMINOPHEN 325 MG PO TABS
650.0000 mg | ORAL_TABLET | Freq: Once | ORAL | Status: DC
  Filled 2019-07-13: qty 2

## 2019-07-13 MED ORDER — RIVAROXABAN 20 MG PO TABS: 20.0000 mg | ORAL_TABLET | Freq: Every day | ORAL | Status: DC

## 2019-07-13 MED ORDER — RIVAROXABAN 20 MG PO TABS
20.0000 mg | ORAL_TABLET | Freq: Every day | ORAL | Status: DC
  Administered 2019-07-14 – 2019-07-16 (×3): 20 mg via ORAL
  Filled 2019-07-13 (×4): qty 1

## 2019-07-13 MED ORDER — ACETAMINOPHEN 650 MG RE SUPP: 650.0000 mg | Freq: Four times a day (QID) | RECTAL | Status: DC | PRN

## 2019-07-13 MED ORDER — SODIUM CHLORIDE 0.9 % IV SOLN: INTRAVENOUS | Status: AC

## 2019-07-13 NOTE — ED Notes (Signed)
ED TO INPATIENT HANDOFF REPORT  ED Nurse Name and Phone #:Ike Maragh [phone]  S Name/Age/Gender [name] 85 y.o. female Room/Bed: APA01/APA01  Code Status   Code Status: Prior  Home/SNF/Other Home Patient oriented to: self, place, time and situation Is this baseline? Yes   Triage Complete: Triage complete  Chief Complaint Ambulatory dysfunction [R26.2]  Triage Note Pt found in the floor by her aid this morning pt does not know what time she fell at. Pt is c/o of back right knee and right ankle pain.     Allergies Allergies  Allergen Reactions  . Contrast Media [Iodinated Diagnostic Agents] Anaphylaxis and Shortness Of Breath  . Vancomycin Shortness Of Breath  . Vioxx [Rofecoxib] Other (See Comments)    REACTION: irregular heartbeat    Level of Care/Admitting Diagnosis ED Disposition    ED Disposition Condition Comment   Admit  Hospital Area: [facility] [[id number]]  Level of Care: Med-Surg [16]  Covid Evaluation: Confirmed COVID Positive  Diagnosis: Ambulatory dysfunction [[id number]]  Admitting Physician: Chiquita Loth  Attending Physician: Randol Kern, DAWOOD S [[id number]]  Bed request comments: PATIENT covid 19 POSITIVE >21 DAYS AGO, NO NEED FOR ISOLATION       B Medical/Surgery History Past Medical History:  Diagnosis Date  . CHF (congestive heart failure) (HCC)   . History of pulmonary embolism   . Parkinson's disease (HCC)   . Venous stasis    History reviewed. No pertinent surgical history.   A IV Location/Drains/Wounds Patient Lines/Drains/Airways Status   Active Line/Drains/Airways    Name:   Placement date:   Placement time:   Site:   Days:   Peripheral IV 07/13/19 Right Antecubital   07/13/19    1127    Antecubital   less than 1          Intake/Output Last 24 hours No intake or output data in the 24 hours ending 07/13/19 1933  Labs/Imaging Results for orders placed or performed during the hospital encounter of  07/13/19 (from the past 48 hour(s))  CBC     Status: None   Collection Time: 07/13/19 10:11 AM  Result Value Ref Range   WBC 8.9 4.0 - 10.5 K/uL   RBC 4.77 3.87 - 5.11 MIL/uL   Hemoglobin 13.4 12.0 - 15.0 g/dL   HCT 67.3 41.9 - 37.9 %   MCV 89.5 80.0 - 100.0 fL   MCH 28.1 26.0 - 34.0 pg   MCHC 31.4 30.0 - 36.0 g/dL   RDW 02.4 09.7 - 35.3 %   Platelets 203 150 - 400 K/uL   nRBC 0.0 0.0 - 0.2 %    Comment: Performed at [facility], [address] [place], [place] [address]  CK     Status: Abnormal   Collection Time: 07/13/19 10:11 AM  Result Value Ref Range   Total CK 514 (H) 38 - 234 U/L    Comment: Performed at [facility], [address] [place], [place] [address]  Basic metabolic panel     Status: Abnormal   Collection Time: 07/13/19 10:11 AM  Result Value Ref Range   Sodium 140 135 - 145 mmol/L   Potassium 3.7 3.5 - 5.1 mmol/L   Chloride 108 98 - 111 mmol/L   CO2 23 22 - 32 mmol/L   Glucose, Bld 115 (H) 70 - 99 mg/dL   BUN 14 8 - 23 mg/dL   Creatinine, Ser 1.96 0.44 - 1.00 mg/dL   Calcium 8.5 (L) 8.9 - 10.3  mg/dL   GFR calc non Af Amer >60 >60 mL/min   GFR calc Af Amer >60 >60 mL/min   Anion gap 9 5 - 15    Comment: Performed at [facility], [address] [place], [place] [address]  Urinalysis, Routine w reflex microscopic     Status: Abnormal   Collection Time: 07/13/19 11:52 AM  Result Value Ref Range   Color, Urine AMBER (A) YELLOW    Comment: BIOCHEMICALS MAY BE AFFECTED BY COLOR   APPearance CLOUDY (A) CLEAR   Specific Gravity, Urine 1.026 1.005 - 1.030   pH 5.0 5.0 - 8.0   Glucose, UA NEGATIVE NEGATIVE mg/dL   Hgb urine dipstick LARGE (A) NEGATIVE   Bilirubin Urine NEGATIVE NEGATIVE   Ketones, ur NEGATIVE NEGATIVE mg/dL   Protein, ur 30 (A) NEGATIVE mg/dL   Nitrite NEGATIVE NEGATIVE   Leukocytes,Ua NEGATIVE NEGATIVE   RBC / HPF >50 (H) 0 - 5 RBC/hpf   WBC, UA 11-20 0 - 5 WBC/hpf   Bacteria, UA RARE (A) NONE SEEN   Squamous Epithelial / LPF 0-5 0 - 5    WBC Clumps PRESENT    Mucus PRESENT    Budding Yeast PRESENT     Comment: Performed at [facility], [address], [place], [place] [address]   DG Knee 1-2 Views Right  Result Date: 07/13/2019 CLINICAL DATA:  Right knee pain. The patient was found down this morning. EXAM: RIGHT KNEE - 1-2 VIEW COMPARISON:  Radiographs dated 02/12/2005 FINDINGS: There is no fracture or dislocation or joint effusion. There is severe osteoarthritis of the medial and patellofemoral compartments with significant progression since 2006. IMPRESSION: No acute abnormality. 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 Electronically Signed   By: Francene Boyers M.D.   On: 07/13/2019 10:13   DG Ankle Complete Right  Result Date: 07/13/2019 CLINICAL DATA:  Right ankle pain after the patient was found down this morning. EXAM: RIGHT ANKLE - COMPLETE 3+ VIEW COMPARISON:  Radiographs dated 12/21/2004 FINDINGS: There is no evidence of fracture, dislocation, or joint effusion. There is no evidence of significant arthropathy or other focal bone abnormality. Soft tissue prominence around the ankle and in the lower leg. IMPRESSION: No acute bone abnormality. Electronically Signed   By: Francene Boyers M.D.   On: 07/13/2019 10:11   CT Knee Right Wo Contrast  Result Date: 07/13/2019 CLINICAL DATA:  Right knee pain, inability to bear weight. Evaluate for fracture EXAM: CT OF THE RIGHT KNEE WITHOUT CONTRAST TECHNIQUE: Multidetector CT imaging of the right knee was performed according to the standard protocol. Multiplanar CT image reconstructions were also generated. COMPARISON:  Same day radiographs FINDINGS: Bones/Joint/Cartilage Osseous structures are diffusely demineralized. No acute fracture. No malalignment. Severe tricompartmental osteoarthritis most pronounced within the medial compartment with complete joint space loss, bony remodeling, and large marginal osteophyte formation. There are multiple ossified  loose bodies at the posterior joint line. There is a small knee joint effusion without fat-fluid or hematocrit level. Ligaments Suboptimally assessed by CT. Muscles and Tendons No gross musculotendinous abnormality by CT. Soft tissues No soft tissue fluid collection or hematoma. Extensive vascular calcifications. IMPRESSION: 1. No acute fracture or malalignment. 2. Severe tricompartmental osteoarthritis most pronounced within the medial compartment. 3. Small knee joint effusion with multiple intra-articular loose bodies. Electronically Signed   By: Duanne Guess D.O.   On: 07/13/2019 16:58   DG HIP UNILAT W OR W/O PELVIS 2-3 VIEWS RIGHT  Result Date: 07/13/2019 CLINICAL DATA:  Right hip pain. The patient was found down this morning. EXAM: DG HIP (WITH OR WITHOUT PELVIS) 2-3V RIGHT COMPARISON:  02/12/2005 FINDINGS: There is no fracture or dislocation. Osteopenia. Minimal degenerative changes of the right hip. IMPRESSION: No acute abnormality. Osteopenia. Electronically Signed   By: Lorriane Shire M.D.   On: 07/13/2019 10:15    Pending Labs Unresulted Labs (From admission, onward)    Start     Ordered   07/13/19 1828  Urine culture  Add-on,   AD     07/13/19 1827   Signed and Held  CBC  (heparin)  Once,   R    Comments: Baseline for heparin therapy IF NOT ALREADY DRAWN.  Notify MD if PLT < 100 K.    Signed and Held   Signed and Held  Creatinine, serum  (heparin)  Once,   R    Comments: Baseline for heparin therapy IF NOT ALREADY DRAWN.    Signed and Held          Vitals/Pain Today's Vitals   07/13/19 1134 07/13/19 1136 07/13/19 1200 07/13/19 1230  BP:  134/79 133/67 131/64  Pulse: 92  (!) 105   Resp:      Temp:      TempSrc:      SpO2: 100%  97%   Weight:      Height:        Isolation Precautions No active isolations  Medications Medications  acetaminophen (TYLENOL) tablet 650 mg (650 mg Oral Refused 07/13/19 1829)  rivaroxaban (XARELTO) tablet 20 mg (has no administration  in time range)  cefTRIAXone (ROCEPHIN) 1 g in sodium chloride 0.9 % 100 mL IVPB (has no administration in time range)  sodium chloride 0.9 % bolus 1,000 mL (0 mLs Intravenous Stopped 07/13/19 1320)  cefTRIAXone (ROCEPHIN) 1 g in sodium chloride 0.9 % 100 mL IVPB (0 g Intravenous Stopped 07/13/19 1932)    Mobility walks with person assist High fall risk   Focused Assessments    R Recommendations: See Admitting Provider Note  Report given to:   Additional Notes:

## 2019-07-13 NOTE — ED Notes (Addendum)
Care management contacted at this time, [name]

## 2019-07-13 NOTE — Evaluation (Signed)
Physical Therapy Evaluation Patient Details Name: [name] MRN: [id number] DOB: May 23, 1935 Today's Date: 07/13/2019   History of Present Illness  [name] is a 85 y/o female found in the floor by her aid this morning pt does not know what time she fell at. Pt is c/o of back right knee and right ankle pain.     Clinical Impression  Patient demonstrates poor tolerance for functional mobility and limited to partial long sitting in bed due to c/o severe pain in RLE with any movement.  Patient will benefit from continued physical therapy in hospital and recommended venue below to increase strength, balance, endurance for safe ADLs and gait.    Follow Up Recommendations SNF;Supervision - Intermittent;Supervision for mobility/OOB    Equipment Recommendations  None recommended by PT    Recommendations for Other Services       Precautions / Restrictions Precautions Precautions: Fall Restrictions Weight Bearing Restrictions: No      Mobility  Bed Mobility Overal bed mobility: Needs Assistance Bed Mobility: Supine to Sit     Supine to sit: Max assist;Total assist     General bed mobility comments: Patient limited to partial long sitting in bed due to c/o severe pain RLE  Transfers                    Ambulation/Gait                Stairs            Wheelchair Mobility    Modified Rankin (Stroke Patients Only)       Balance Overall balance assessment: Needs assistance Sitting-balance support: Bilateral upper extremity supported;Feet unsupported Sitting balance-Leahy Scale: Poor Sitting balance - Comments: partial long sitting                                     Pertinent Vitals/Pain Pain Assessment: Faces Faces Pain Scale: Hurts whole lot Pain Location: RLE  Pain Descriptors / Indicators: Grimacing;Guarding;Sharp;Sore Pain Intervention(s): Limited activity within patient's tolerance;Monitored during  session;Repositioned    Home Living Family/patient expects to be discharged to:: Private residence Living Arrangements: Alone Available Help at Discharge: Personal care attendant;Available PRN/intermittently Type of Home: House Home Access: Ramped entrance     Home Layout: One level Home Equipment: Walker - 2 wheels;Wheelchair - manual      Prior Function Level of Independence: Needs assistance   Gait / Transfers Assistance Needed: short distance household ambulator with RW and supervison  ADL's / Homemaking Assistance Needed: personal care attendants 8 hours/day x 7 days/week        Hand Dominance        Extremity/Trunk Assessment   Upper Extremity Assessment Upper Extremity Assessment: Generalized weakness    Lower Extremity Assessment Lower Extremity Assessment: Generalized weakness;RLE deficits/detail;LLE deficits/detail RLE Deficits / Details: grossly -2/5 RLE Sensation: WNL RLE Coordination: decreased gross motor LLE Deficits / Details: grossly 3/5 LLE Sensation: WNL LLE Coordination: WNL       Communication   Communication: No difficulties  Cognition Arousal/Alertness: Awake/alert Behavior During Therapy: WFL for tasks assessed/performed;Agitated Overall Cognitive Status: Within Functional Limits for tasks assessed                                        General Comments  Exercises     Assessment/Plan    PT Assessment Patient needs continued PT services  PT Problem List Decreased strength;Decreased activity tolerance;Decreased balance;Decreased mobility;Pain       PT Treatment Interventions DME instruction;Gait training;Functional mobility training;Therapeutic activities;Therapeutic exercise;Patient/family education;Wheelchair mobility training    PT Goals (Current goals can be found in the Care Plan section)  Acute Rehab PT Goals Patient Stated Goal: return home with caregivers to assist PT Goal Formulation: With  patient Time For Goal Achievement: 07/27/19 Potential to Achieve Goals: Fair    Frequency Min 2X/week   Barriers to discharge        Co-evaluation               AM-PAC PT "6 Clicks" Mobility  Outcome Measure Help needed turning from your back to your side while in a flat bed without using bedrails?: A Lot Help needed moving from lying on your back to sitting on the side of a flat bed without using bedrails?: Total Help needed moving to and from a bed to a chair (including a wheelchair)?: Total Help needed standing up from a chair using your arms (e.g., wheelchair or bedside chair)?: Total Help needed to walk in hospital room?: Total Help needed climbing 3-5 steps with a railing? : Total 6 Click Score: 7    End of Session   Activity Tolerance: Patient limited by fatigue;Patient limited by pain Patient left: in bed;with call bell/phone within reach Nurse Communication: Mobility status PT Visit Diagnosis: Unsteadiness on feet (R26.81);Other abnormalities of gait and mobility (R26.89);Muscle weakness (generalized) (M62.81)    Time: 1535-1550 PT Time Calculation (min) (ACUTE ONLY): 15 min   Charges:   PT Evaluation $PT Eval Low Complexity: 1 Low PT Treatments $Therapeutic Activity: 8-22 mins        4:05 PM, 07/13/19 Lonell Grandchild, MPT Physical Therapist with [facility] [phone] office [phone] mobile phone

## 2019-07-13 NOTE — ED Provider Notes (Signed)
Patient signed out to me by Evalee Jefferson, PA-C at end of shift with CT of the right knee pending.  With history of Parkinson's disease, generalized weakness, limited mobility at baseline,  and CHF.  She lives at home alone, she does have home health aides to assist her during daytime hours.  Patient came to the emergency department after a fall in the floor sometime during the early morning hours.  She complains of worsening pain of her right leg and unable to bear weight to the extremity.  Of note, patient positive for COVID-19 on December 26.  She currently denies any symptoms related to this. PT has evaluated pt and social work consult was placed earlier, but no call back at sign out to me.    Also found to have a urinary tract infection.  Vital signs or reassuring and she is nontoxic-appearing.  CT of the knee shows significant osteoarthritis without evidence of occult fracture.  patient has UTI she was given IV Rocephin and I will consult hospitalist for admission.  I had social work consulted again, and spoke with Roderic Palau who agrees to f/u with pt  Labs Reviewed  CK - Abnormal; Notable for the following components:      Result Value   Total CK 514 (*)    All other components within normal limits  BASIC METABOLIC PANEL - Abnormal; Notable for the following components:   Glucose, Bld 115 (*)    Calcium 8.5 (*)    All other components within normal limits  URINALYSIS, ROUTINE W REFLEX MICROSCOPIC - Abnormal; Notable for the following components:   Color, Urine AMBER (*)    APPearance CLOUDY (*)    Hgb urine dipstick LARGE (*)    Protein, ur 30 (*)    RBC / HPF >50 (*)    Bacteria, UA RARE (*)    All other components within normal limits  URINE CULTURE  CBC   DG Knee 1-2 Views Right  Result Date: 07/13/2019 CLINICAL DATA:  Right knee pain. The patient was found down this morning. EXAM: RIGHT KNEE - 1-2 VIEW COMPARISON:  Radiographs dated 02/12/2005 FINDINGS: There is no fracture  or dislocation or joint effusion. There is severe osteoarthritis of the medial and patellofemoral compartments with significant progression since 2006. IMPRESSION: No acute abnormality. 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 Electronically Signed   By: Lorriane Shire M.D.   On: 07/13/2019 10:13   DG Ankle Complete Right  Result Date: 07/13/2019 CLINICAL DATA:  Right ankle pain after the patient was found down this morning. EXAM: RIGHT ANKLE - COMPLETE 3+ VIEW COMPARISON:  Radiographs dated 12/21/2004 FINDINGS: There is no evidence of fracture, dislocation, or joint effusion. There is no evidence of significant arthropathy or other focal bone abnormality. Soft tissue prominence around the ankle and in the lower leg. IMPRESSION: No acute bone abnormality. Electronically Signed   By: Lorriane Shire M.D.   On: 07/13/2019 10:11   CT Knee Right Wo Contrast  Result Date: 07/13/2019 CLINICAL DATA:  Right knee pain, inability to bear weight. Evaluate for fracture EXAM: CT OF THE RIGHT KNEE WITHOUT CONTRAST TECHNIQUE: Multidetector CT imaging of the right knee was performed according to the standard protocol. Multiplanar CT image reconstructions were also generated. COMPARISON:  Same day radiographs FINDINGS: Bones/Joint/Cartilage Osseous structures are diffusely demineralized. No acute fracture. No malalignment. Severe tricompartmental osteoarthritis most pronounced within the medial compartment with complete joint space loss, bony remodeling, and large marginal osteophyte formation. There  are multiple ossified loose bodies at the posterior joint line. There is a small knee joint effusion without fat-fluid or hematocrit level. Ligaments Suboptimally assessed by CT. Muscles and Tendons No gross musculotendinous abnormality by CT. Soft tissues No soft tissue fluid collection or hematoma. Extensive vascular calcifications. IMPRESSION: 1. No acute fracture or malalignment. 2. Severe  tricompartmental osteoarthritis most pronounced within the medial compartment. 3. Small knee joint effusion with multiple intra-articular loose bodies. Electronically Signed   By: Duanne Guess D.O.   On: 07/13/2019 16:58   DG Chest Port 1 View  Result Date: 06/17/2019 CLINICAL DATA:  Shortness of breath with productive cough for 3 days. EXAM: PORTABLE CHEST 1 VIEW COMPARISON:  July 21, 2018 FINDINGS: The mediastinal contour is normal. The heart size is enlarged. Evaluation of the left lung base is limited due to overlying hand. The lungs are probably clear. The visualized skeletal structures are unremarkable. IMPRESSION: 1. The lungs are probably clear. 2. Evaluation of the left lung base is limited due to overlying hand. Electronically Signed   By: Sherian Rein M.D.   On: 06/17/2019 12:06   DG HIP UNILAT W OR W/O PELVIS 2-3 VIEWS RIGHT  Result Date: 07/13/2019 CLINICAL DATA:  Right hip pain. The patient was found down this morning. EXAM: DG HIP (WITH OR WITHOUT PELVIS) 2-3V RIGHT COMPARISON:  02/12/2005 FINDINGS: There is no fracture or dislocation. Osteopenia. Minimal degenerative changes of the right hip. IMPRESSION: No acute abnormality. Osteopenia. Electronically Signed   By: Francene Boyers M.D.   On: 07/13/2019 10:15    1850  Consulted hospitalist, Dr. Randol Kern who graciously agrees to admit pt.     Pauline Aus, PA-C 07/13/19 1911    Mancel Bale, MD 07/17/19 [phone]

## 2019-07-13 NOTE — ED Triage Notes (Signed)
Pt found in the floor by her aid this morning pt does not know what time she fell at. Pt is c/o of back right knee and right ankle pain.

## 2019-07-13 NOTE — Plan of Care (Signed)
  Problem: Acute Rehab PT Goals(only PT should resolve) Goal: Pt Will Go Supine/Side To Sit Outcome: Progressing Flowsheets (Taken 07/13/2019 1607) Pt will go Supine/Side to Sit: with moderate assist Goal: Patient Will Transfer Sit To/From Stand Outcome: Progressing Flowsheets (Taken 07/13/2019 1607) Patient will transfer sit to/from stand: with moderate assist Goal: Pt Will Transfer Bed To Chair/Chair To Bed Outcome: Progressing Flowsheets (Taken 07/13/2019 1607) Pt will Transfer Bed to Chair/Chair to Bed: with mod assist Goal: Pt Will Ambulate Outcome: Progressing Flowsheets (Taken 07/13/2019 1607) Pt will Ambulate:  10 feet  with rolling walker   4:07 PM, 07/13/19 Ocie Bob, MPT Physical Therapist with [facility] [phone] office [phone] mobile phone

## 2019-07-13 NOTE — ED Provider Notes (Signed)
[facility] EMERGENCY DEPARTMENT Provider Note   CSN: [id number] Arrival date & time: 07/13/19  0846     History Chief Complaint  Patient presents with  . Fall    [name] is a 85 y.o. female with a history of CHF, PE on Xarelto, Parkinson's disease, who tested positive for COVID-19 when seen here on December 26 presenting for evaluation of injury sustained in a fall.  She fell asleep in her recliner chair yesterday evening, woke up to find that she was sliding out of the chair and landed on her buttocks.  During the fall she also injured her right foot, right knee and buttocks.  She also endorses she felt "just too weak" to try and get off the floor.  She denies head injury, no headache.  She does endorse chronic neck pain which is not worsened today.  She spent the night on the floor as she was unable to reach her phone to call for assistance.  She lives alone but has daytime assistance.  Her caregiver found her this morning on the floor.  She states she spent the night awake.  She has had no medications or treatment prior to arrival.  The history is provided by the patient.       Past Medical History:  Diagnosis Date  . CHF (congestive heart failure) (HCC)   . History of pulmonary embolism   . Parkinson's disease (HCC)   . Venous stasis     Patient Active Problem List   Diagnosis Date Noted  . Goals of care, counseling/discussion   . Palliative care by specialist   . DNR (do not resuscitate) discussion   . Coagulopathy (HCC) 03/19/2018  . Elevated INR   . Supratherapeutic INR 02/24/2018  . Physical debility 02/24/2018  . Folliculitis of perineum 12/15/2017  . Streptococcal bacteremia 07/09/2017  . Cellulitis of lower extremity   . Bacteremia due to Gram-positive bacteria 07/06/2017  . Cellulitis and abscess of left leg 07/05/2017  . Vulvar abscess 07/05/2017  . Infected cyst of Bartholin's gland duct 07/05/2017  . Weakness 12/21/2014  . Leg weakness 12/20/2014    . Lower extremity weakness 12/20/2014  . Precordial pain 12/20/2014  . Diarrhea   . Chest pain 11/28/2014  . Pressure ulcer, heel 11/28/2014  . AKI (acute kidney injury) (HCC) 11/28/2014  . Chronic diastolic congestive heart failure (HCC) 11/28/2014  . Parkinson's disease (HCC) 11/28/2014  . Pain in the chest   . Encounter for therapeutic drug monitoring 09/15/2013  . Long term (current) use of anticoagulants 09/22/2010  . Pulmonary embolism (HCC) 08/25/2010  . Generalized weakness 08/16/2009  . LEG EDEMA 08/16/2009  . Chronic diastolic CHF (congestive heart failure) (HCC) 08/16/2009  . Venous (peripheral) insufficiency 11/15/2008  . PULMONARY EMBOLISM, HX OF 11/15/2008    History reviewed. No pertinent surgical history.   OB History   No obstetric history on file.     Family History  Problem Relation Age of Onset  . Heart disease Mother        34s    Social History   Tobacco Use  . Smoking status: Never Smoker  . Smokeless tobacco: Never Used  Substance Use Topics  . Alcohol use: No  . Drug use: No    Home Medications Prior to Admission medications   Medication Sig Start Date End Date Taking? Authorizing Provider  acetaminophen (TYLENOL) 500 MG tablet Take 1,000 mg by mouth daily as needed for mild pain.    [provider]  clindamycin (CLEOCIN) 300 MG capsule Take 1 capsule (300 mg total) by mouth 4 (four) times daily. X 7 days Patient not taking: Reported on 06/17/2019 05/12/19   Mancel Bale, MD  furosemide (LASIX) 20 MG tablet Take 1 tablet (20 mg total) by mouth daily. Patient not taking: Reported on 06/17/2019 07/01/18 07/01/19  Erick Blinks, MD  potassium chloride (K-DUR) 10 MEQ tablet Take 1 tablet (10 mEq total) by mouth daily. Patient not taking: Reported on 06/17/2019 07/01/18   Erick Blinks, MD  Rivaroxaban 15 & 20 MG TBPK Follow package directions: Take one 15mg  tablet by mouth twice a day. On day 22, switch to one 20mg  tablet once a  day. Take with food. 06/17/19   , MD  warfarin (COUMADIN) 2.5 MG tablet Take 2.5 mg by mouth every evening.    [provider]    Allergies    Contrast media [iodinated diagnostic agents], Vancomycin, and Vioxx [rofecoxib]  Review of Systems   Review of Systems  Constitutional: Negative for chills and fever.  HENT: Negative.   Respiratory: Negative for shortness of breath.   Cardiovascular: Negative.   Gastrointestinal: Negative.   Musculoskeletal: Positive for arthralgias. Negative for joint swelling and myalgias.  Neurological: Negative.  Negative for weakness and numbness.    Physical Exam Updated Vital Signs BP 131/64   Pulse (!) 105   Temp 98.4 F (36.9 C) (Oral)   Resp 18   Ht 5\' 2"  (1.575 m)   Wt 54.4 kg   SpO2 97%   BMI 21.95 kg/m   Physical Exam Vitals and nursing note reviewed.  Constitutional:      Appearance: Normal appearance. She is well-developed.  HENT:     Head: Normocephalic and atraumatic.  Eyes:     Conjunctiva/sclera: Conjunctivae normal.  Neck:     Comments: Neck held in extreme kyphosis, baseline. Cardiovascular:     Rate and Rhythm: Normal rate and regular rhythm.     Heart sounds: Normal heart sounds.  Pulmonary:     Effort: Pulmonary effort is normal.     Breath sounds: Normal breath sounds. No wheezing.  Abdominal:     General: Bowel sounds are normal.     Palpations: Abdomen is soft.     Tenderness: There is no abdominal tenderness.  Musculoskeletal:        General: Tenderness present. Normal range of motion.     Cervical back: Normal range of motion. No tenderness.     Right knee: Bony tenderness present. No erythema or crepitus.     Right foot: Bony tenderness present. No swelling or deformity.     Comments: ttp dorsal right foot and anterior right knee without edema or deformity.   Skin:    General: Skin is warm and dry.  Neurological:     General: No focal deficit present.     Mental Status: She is  alert.     Comments: Resting tremor hands.      ED Results / Procedures / Treatments   Labs (all labs ordered are listed, but only abnormal results are displayed) Labs Reviewed  CK - Abnormal; Notable for the following components:      Result Value   Total CK 514 (*)    All other components within normal limits  BASIC METABOLIC PANEL - Abnormal; Notable for the following components:   Glucose, Bld 115 (*)    Calcium 8.5 (*)    All other components within normal limits  URINALYSIS, ROUTINE W REFLEX  MICROSCOPIC - Abnormal; Notable for the following components:   Color, Urine AMBER (*)    APPearance CLOUDY (*)    Hgb urine dipstick LARGE (*)    Protein, ur 30 (*)    RBC / HPF >50 (*)    Bacteria, UA RARE (*)    All other components within normal limits  CBC    EKG None  Radiology DG Knee 1-2 Views Right  Result Date: 07/13/2019 CLINICAL DATA:  Right knee pain. The patient was found down this morning. EXAM: RIGHT KNEE - 1-2 VIEW COMPARISON:  Radiographs dated 02/12/2005 FINDINGS: There is no fracture or dislocation or joint effusion. There is severe osteoarthritis of the medial and patellofemoral compartments with significant progression since 2006. IMPRESSION: No acute abnormality. 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 Electronically Signed   By: Lorriane Shire M.D.   On: 07/13/2019 10:13   DG Ankle Complete Right  Result Date: 07/13/2019 CLINICAL DATA:  Right ankle pain after the patient was found down this morning. EXAM: RIGHT ANKLE - COMPLETE 3+ VIEW COMPARISON:  Radiographs dated 12/21/2004 FINDINGS: There is no evidence of fracture, dislocation, or joint effusion. There is no evidence of significant arthropathy or other focal bone abnormality. Soft tissue prominence around the ankle and in the lower leg. IMPRESSION: No acute bone abnormality. Electronically Signed   By: Lorriane Shire M.D.   On: 07/13/2019 10:11   DG HIP UNILAT W OR W/O PELVIS  2-3 VIEWS RIGHT  Result Date: 07/13/2019 CLINICAL DATA:  Right hip pain. The patient was found down this morning. EXAM: DG HIP (WITH OR WITHOUT PELVIS) 2-3V RIGHT COMPARISON:  02/12/2005 FINDINGS: There is no fracture or dislocation. Osteopenia. Minimal degenerative changes of the right hip. IMPRESSION: No acute abnormality. Osteopenia. Electronically Signed   By: Lorriane Shire M.D.   On: 07/13/2019 10:15    Procedures Procedures (including critical care time)  Medications Ordered in ED Medications  cefTRIAXone (ROCEPHIN) 1 g in sodium chloride 0.9 % 100 mL IVPB (has no administration in time range)  sodium chloride 0.9 % bolus 1,000 mL (0 mLs Intravenous Stopped 07/13/19 1320)    ED Course  I have reviewed the triage vital signs and the nursing notes.  Pertinent labs & imaging results that were available during my care of the patient were reviewed by me and considered in my medical decision making (see chart for details).    MDM Rules/Calculators/A&P                      Pt with a apparent controlled fall, sliding out of her chair, prolonged time on the floor awaiting assistance.  After imaging reviewed and negative for acute fractures,  Attempts to weight bear unsuccessful - cannot tolerate weight bearing.  CT imaging ordered to rule out occult fracture.  Pt lives at home with some home health support, but is essentially home for 16 hours daily with no assistance or ability to ambulate by self at baseline.  Discussed nursing home placement which pt is very reluctant to commit to this.  However, she is unable to weight bear, so sending her home would be difficult.  Social work consult has been ordered, pt eval also ordered.  Pt has uti - she was given Rocephin.  In the interim, pt will need  admission.  Her labs were reviewed  And she does have a modestly elevated CK with a normal creatinine.  She is not clearly in rhabdomyolysis but will benefit  from  recheck of the CK to ensure it does not  continue to rise.  She was given IV fluids here.    Pending CT imaging of the right knee to assess for occult fracture or other reason for inability to weight bear.  Pt discussed with Tammy Triplett PA- C who assumes care of pt.    Final Clinical Impression(s) / ED Diagnoses Final diagnoses:  Fall, initial encounter  Acute pain of right knee  Weakness  Acute cystitis with hematuria    Rx / DC Orders ED Discharge Orders    None       Victoriano Lain 07/13/19 1654    Bethann Berkshire, MD 07/14/19 [phone]

## 2019-07-13 NOTE — H&P (Signed)
TR[facility] H&P   Patient Demographics:    [name], is a 85 y.o. female  MRN: [id number]   DOB - 1934-06-28  Admit Date - 07/13/2019  Outpatient Primary MD for the patient is [name], MD  Referring MD/NP/PA: PA Tammy  Patient coming from: Home  Chief Complaint  Patient presents with  . Fall      HPI:    [name]  is a 85 y.o. female, a history of CHF, PE on Xarelto, Parkinson's disease, who tested positive for COVID-19 when seen here on December 26 presenting for evaluation of injury sustained in a fall.  Patient with baseline poor mobility, given her Parkinson disease, and osteoarthritis, patient fell asleep in her recliner chair yesterday, and this morning she woke up to find that she was sliding out of the chair and landed on her buttocks, reports she did hurt her right lower extremity during the fall, and reports she felt just too weak to get off the floor, no head injury, no headache, no focal deficits, lives at home by herself, she has visiting aide on a daily basis, total of 8 hours. - IN ED work-up was significant for positive UA, she does report mild dysuria, started on Rocephin, she had mildly elevated total CK, but renal function at baseline, patient was seen by PT, recommendation was for SNF, but she declined month I was consulted to admit overnight for safe disposition is achieved.    Review of systems:    In addition to the HPI above,  No Fever-chills, No Headache, No changes with Vision or hearing, No problems swallowing food or Liquids, No Chest pain, Cough or Shortness of Breath, No Abdominal pain, No Nausea or Vommitting, Bowel movements are regular, No Blood in stool or Urine, She reports dysuria, No new skin rashes or bruises, Does report generalized body ache, mainly in right knee No new weakness, tingling, numbness in any extremity, No recent weight  gain or loss, No polyuria, polydypsia or polyphagia, No significant Mental Stressors.  A full 10 point Review of Systems was done, except as stated above, all other Review of Systems were negative.   With Past History of the following :    Past Medical History:  Diagnosis Date  . CHF (congestive heart failure) (HCC)   . History of pulmonary embolism   . Parkinson's disease (HCC)   . Venous stasis       History reviewed. No pertinent surgical history.    Social History:     Social History   Tobacco Use  . Smoking status: Never Smoker  . Smokeless tobacco: Never Used  Substance Use Topics  . Alcohol use: No       Family History :     Family History  Problem Relation Age of Onset  . Heart disease Mother        60s  Home Medications:   Prior to Admission medications   Medication Sig Start Date End Date Taking? Authorizing Provider  acetaminophen (TYLENOL) 500 MG tablet Take 1,000 mg by mouth daily as needed for mild pain.    [provider]  clindamycin (CLEOCIN) 300 MG capsule Take 1 capsule (300 mg total) by mouth 4 (four) times daily. X 7 days Patient not taking: Reported on 06/17/2019 05/12/19   Daleen Bo, MD  furosemide (LASIX) 20 MG tablet Take 1 tablet (20 mg total) by mouth daily. Patient not taking: Reported on 06/17/2019 07/01/18 07/01/19  Kathie Dike, MD  potassium chloride (K-DUR) 10 MEQ tablet Take 1 tablet (10 mEq total) by mouth daily. Patient not taking: Reported on 06/17/2019 07/01/18   Kathie Dike, MD  Rivaroxaban 15 & 20 MG TBPK Follow package directions: Take one 15mg  tablet by mouth twice a day. On day 22, switch to one 20mg  tablet once a day. Take with food. 06/17/19   Noemi Chapel, MD  warfarin (COUMADIN) 2.5 MG tablet Take 2.5 mg by mouth every evening.    [provider]     Allergies:     Allergies  Allergen Reactions  . Contrast Media [Iodinated Diagnostic Agents] Anaphylaxis and Shortness Of Breath   . Vancomycin Shortness Of Breath  . Vioxx [Rofecoxib] Other (See Comments)    REACTION: irregular heartbeat     Physical Exam:   Vitals  Blood pressure 131/64, pulse (!) 105, temperature 98.4 F (36.9 C), temperature source Oral, resp. rate 18, height 5\' 2"  (1.575 m), weight 54.4 kg, SpO2 97 %.   1. General elderly female, laying in bed, in no apparent distress, patient with significant kyphosis  2. Normal affect and insight, Not Suicidal or Homicidal, Awake Alert, Oriented X 3.  3. No F.N deficits, ALL C.Nerves Intact, Strength 5/5 all 4 extremities, Sensation intact all 4 extremities, Plantars down going.  4. Ears and Eyes appear Normal, Conjunctivae clear, PERRLA. Moist Oral Mucosa.  5. Supple Neck, No JVD, No cervical lymphadenopathy appriciated, No Carotid Bruits.  6. Symmetrical Chest wall movement, Good air movement bilaterally, CTAB.  7. RRR, No Gallops, Rubs or Murmurs, No Parasternal Heave.  8. Positive Bowel Sounds, Abdomen Soft, No tenderness, No organomegaly appriciated,No rebound -guarding or rigidity.  9.  No Cyanosis, Normal Skin Turgor, patient with chronic lower extremity discoloration  10. Good muscle tone,  joints appear normal , no effusions, Normal ROM.  11. No Palpable Lymph Nodes in Neck or Axillae    Data Review:    CBC Recent Labs  Lab 07/13/19 1011  WBC 8.9  HGB 13.4  HCT 42.7  PLT 203  MCV 89.5  MCH 28.1  MCHC 31.4  RDW 14.4   ------------------------------------------------------------------------------------------------------------------  Chemistries  Recent Labs  Lab 07/13/19 1011  NA 140  K 3.7  CL 108  CO2 23  GLUCOSE 115*  BUN 14  CREATININE 0.49  CALCIUM 8.5*   ------------------------------------------------------------------------------------------------------------------ estimated creatinine clearance is 42.9 mL/min (by C-G formula based on SCr of 0.49  mg/dL). ------------------------------------------------------------------------------------------------------------------ No results for input(s): TSH, T4TOTAL, T3FREE, THYROIDAB in the last 72 hours.  Invalid input(s): FREET3  Coagulation profile No results for input(s): INR, PROTIME in the last 168 hours. ------------------------------------------------------------------------------------------------------------------- No results for input(s): DDIMER in the last 72 hours. -------------------------------------------------------------------------------------------------------------------  Cardiac Enzymes No results for input(s): CKMB, TROPONINI, MYOGLOBIN in the last 168 hours.  Invalid input(s): CK ------------------------------------------------------------------------------------------------------------------    Component Value Date/Time   BNP 180.0 (H) 04/08/2018 1521     ---------------------------------------------------------------------------------------------------------------  Urinalysis    Component Value Date/Time   COLORURINE AMBER (A) 07/13/2019 1152   APPEARANCEUR CLOUDY (A) 07/13/2019 1152   LABSPEC 1.026 07/13/2019 1152   PHURINE 5.0 07/13/2019 1152   GLUCOSEU NEGATIVE 07/13/2019 1152   HGBUR LARGE (A) 07/13/2019 1152   BILIRUBINUR NEGATIVE 07/13/2019 1152   KETONESUR NEGATIVE 07/13/2019 1152   PROTEINUR 30 (A) 07/13/2019 1152   UROBILINOGEN 0.2 12/20/2014 2054   NITRITE NEGATIVE 07/13/2019 1152   LEUKOCYTESUR NEGATIVE 07/13/2019 1152    ----------------------------------------------------------------------------------------------------------------   Imaging Results:    DG Knee 1-2 Views Right  Result Date: 07/13/2019 CLINICAL DATA:  Right knee pain. The patient was found down this morning. EXAM: RIGHT KNEE - 1-2 VIEW COMPARISON:  Radiographs dated 02/12/2005 FINDINGS: There is no fracture or dislocation or joint effusion. There is severe  osteoarthritis of the medial and patellofemoral compartments with significant progression since 2006. IMPRESSION: No acute abnormality. 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 Electronically Signed   By: Francene Boyers M.D.   On: 07/13/2019 10:13   DG Ankle Complete Right  Result Date: 07/13/2019 CLINICAL DATA:  Right ankle pain after the patient was found down this morning. EXAM: RIGHT ANKLE - COMPLETE 3+ VIEW COMPARISON:  Radiographs dated 12/21/2004 FINDINGS: There is no evidence of fracture, dislocation, or joint effusion. There is no evidence of significant arthropathy or other focal bone abnormality. Soft tissue prominence around the ankle and in the lower leg. IMPRESSION: No acute bone abnormality. Electronically Signed   By: Francene Boyers M.D.   On: 07/13/2019 10:11   CT Knee Right Wo Contrast  Result Date: 07/13/2019 CLINICAL DATA:  Right knee pain, inability to bear weight. Evaluate for fracture EXAM: CT OF THE RIGHT KNEE WITHOUT CONTRAST TECHNIQUE: Multidetector CT imaging of the right knee was performed according to the standard protocol. Multiplanar CT image reconstructions were also generated. COMPARISON:  Same day radiographs FINDINGS: Bones/Joint/Cartilage Osseous structures are diffusely demineralized. No acute fracture. No malalignment. Severe tricompartmental osteoarthritis most pronounced within the medial compartment with complete joint space loss, bony remodeling, and large marginal osteophyte formation. There are multiple ossified loose bodies at the posterior joint line. There is a small knee joint effusion without fat-fluid or hematocrit level. Ligaments Suboptimally assessed by CT. Muscles and Tendons No gross musculotendinous abnormality by CT. Soft tissues No soft tissue fluid collection or hematoma. Extensive vascular calcifications. IMPRESSION: 1. No acute fracture or malalignment. 2. Severe tricompartmental osteoarthritis most pronounced within the  medial compartment. 3. Small knee joint effusion with multiple intra-articular loose bodies. Electronically Signed   By: Duanne Guess D.O.   On: 07/13/2019 16:58   DG HIP UNILAT W OR W/O PELVIS 2-3 VIEWS RIGHT  Result Date: 07/13/2019 CLINICAL DATA:  Right hip pain. The patient was found down this morning. EXAM: DG HIP (WITH OR WITHOUT PELVIS) 2-3V RIGHT COMPARISON:  02/12/2005 FINDINGS: There is no fracture or dislocation. Osteopenia. Minimal degenerative changes of the right hip. IMPRESSION: No acute abnormality. Osteopenia. Electronically Signed   By: Francene Boyers M.D.   On: 07/13/2019 10:15      Assessment & Plan:    Active Problems:   Chronic diastolic CHF (congestive heart failure) (HCC)   PULMONARY EMBOLISM, HX OF   Parkinson's disease (HCC)   Physical debility   DNR (do not resuscitate) discussion   Ambulatory dysfunction   Ambulatory dysfunction/fall -Patient with known physical debility, history of Parkinson, osteoarthritis, she presents with fall, no evidence of acute fracture on imaging, but patient  remains significantly weak, deconditioned in ED, felt unsafe to be discharged today. -She will be admitted overnight, will consult PT/OT, will consult social worker to assist with placement tomorrow back to home, she is having home care 8 hours/day, 8 AM -12 PM and 4 PM to 8 PM, this has to be resumed before discharge. -Patient declined SNF. -Patient with mild elevated total CK, will keep on gentle hydration overnight and recheck in a.m.  Chronic diastolic CHF -She appears to be a euvolemic  UTI -She does report some dysuria, continue with Rocephin  History of pulmonary embolism -She was resumed back on Xarelto during recent ED visit, continue with Xarelto.    DVT Prophylaxis Xarelto  AM Labs Ordered, also please review Full Orders  Family Communication: Admission, patients condition and plan of care including tests being ordered have been discussed with the  patient who indicate understanding and agree with the plan and Code Status.  Code Status DNR  Likely DC to  Home with Home Health  Condition GUARDED    Consults called: None  Admission status: observation  Time spent in minutes : 45 MINUTES   Huey Bienenstock M.D on 07/13/2019 at 7:32 PM  Between 7am to 7pm - Pager - [phone]. After 7pm go to [email or web address] - password [facility]  [facility] - Office  [phone]

## 2019-07-13 NOTE — ED Notes (Signed)
Patient would not even allow [place] to bend her knees so we were unable to ambulate

## 2019-07-13 NOTE — Progress Notes (Addendum)
CSW received a call from pt's PA who stated that a [facility]K consult was placed for concerns that pt is elderly, lives at home, and while pt has an aide that is with her during the day, there are concerns about the pt returning home to be alone at night,as the pt was, "found in the floor by her aid this morning pt does not know what time she fell at. Pt is c/o of back right knee and right ankle pain", per the PT notes.  PA can be reached at ph: [phone].  CSW will continue to follow for D/C needs.  Dorothe Pea. Amandy Chubbuck, LCSW, LCAS, CSI Transitions of Care Clinical Social Worker Care Coordination Department Ph: [phone]     \

## 2019-07-14 DIAGNOSIS — Z7189 Other specified counseling: Secondary | ICD-10-CM

## 2019-07-14 DIAGNOSIS — I5032 Chronic diastolic (congestive) heart failure: Secondary | ICD-10-CM | POA: Diagnosis not present

## 2019-07-14 DIAGNOSIS — G20 Parkinson's disease: Secondary | ICD-10-CM | POA: Diagnosis not present

## 2019-07-14 LAB — CK: Total CK: 1060 U/L — ABNORMAL HIGH (ref 38–234)

## 2019-07-14 MED ORDER — ENSURE ENLIVE PO LIQD: 237.0000 mL | ORAL | Status: DC

## 2019-07-14 MED ORDER — ADULT MULTIVITAMIN W/MINERALS CH
1.0000 | ORAL_TABLET | Freq: Every day | ORAL | Status: DC
  Administered 2019-07-16 – 2019-07-17 (×2): 1 via ORAL
  Filled 2019-07-14 (×4): qty 1

## 2019-07-14 MED ORDER — ENSURE ENLIVE PO LIQD: 237.0000 mL | Freq: Two times a day (BID) | ORAL | Status: DC

## 2019-07-14 MED ORDER — SODIUM CHLORIDE 0.9 % IV SOLN
1.0000 g | INTRAVENOUS | Status: DC
  Administered 2019-07-15: 17:00:00 1 g via INTRAVENOUS
  Filled 2019-07-14: qty 10

## 2019-07-14 NOTE — Progress Notes (Signed)
Physical Therapy Treatment Patient Details Name: [name] MRN: [id number] DOB: [birth year] Today's Date: 07/14/2019    History of Present Illness [name] is a 85 y/o female found in the floor by her aid this morning pt does not know what time she fell at. Pt is c/o of back right knee and right ankle pain.     PT Comments    Patient very apprehensive to attempt functional mobility and transfers and limited to partial long sitting and able use BUE for helping to scoot up in bed.  Patient tolerated active assistive ROM exercises to BLE with minor increase in pain and declined to attempt sitting up at bedside.  Patient will benefit from continued physical therapy in hospital and recommended venue below to increase strength, balance, endurance for safe ADLs and gait.    Follow Up Recommendations  SNF;Supervision - Intermittent;Supervision for mobility/OOB     Equipment Recommendations  None recommended by PT    Recommendations for Other Services       Precautions / Restrictions Precautions Precautions: Fall Restrictions Weight Bearing Restrictions: No    Mobility  Bed Mobility Overal bed mobility: Needs Assistance Bed Mobility: Supine to Sit     Supine to sit: Max assist;Total assist     General bed mobility comments: Patient limited to partial long sitting in bed  Transfers                    Ambulation/Gait                 Stairs             Wheelchair Mobility    Modified Rankin (Stroke Patients Only)       Balance Overall balance assessment: Needs assistance Sitting-balance support: Bilateral upper extremity supported;Feet unsupported Sitting balance-Leahy Scale: Poor Sitting balance - Comments: partial long sitting                                    Cognition Arousal/Alertness: Awake/alert Behavior During Therapy: WFL for tasks assessed/performed;Agitated Overall Cognitive Status: Within Functional Limits  for tasks assessed                                        Exercises General Exercises - Lower Extremity Ankle Circles/Pumps: Supine;10 reps;Both;Strengthening;AROM Heel Slides: Supine;10 reps;Both;Strengthening;AROM Hip ABduction/ADduction: Supine;10 reps;Both;Strengthening;AROM    General Comments        Pertinent Vitals/Pain Pain Assessment: Faces Faces Pain Scale: Hurts little more Pain Location: RLE  Pain Descriptors / Indicators: Grimacing;Guarding;Sharp;Sore    Home Living                      Prior Function            PT Goals (current goals can now be found in the care plan section) Acute Rehab PT Goals Patient Stated Goal: return home with caregivers to assist PT Goal Formulation: With patient Time For Goal Achievement: 07/27/19 Potential to Achieve Goals: Fair Progress towards PT goals: Progressing toward goals    Frequency    Min 2X/week      PT Plan      Co-evaluation              AM-PAC PT "6 Clicks" Mobility   Outcome Measure  Help needed  turning from your back to your side while in a flat bed without using bedrails?: A Lot Help needed moving from lying on your back to sitting on the side of a flat bed without using bedrails?: Total Help needed moving to and from a bed to a chair (including a wheelchair)?: Total Help needed standing up from a chair using your arms (e.g., wheelchair or bedside chair)?: Total Help needed to walk in hospital room?: Total Help needed climbing 3-5 steps with a railing? : Total 6 Click Score: 7    End of Session   Activity Tolerance: Patient limited by fatigue;Patient limited by pain Patient left: in bed;with call bell/phone within reach Nurse Communication: Mobility status PT Visit Diagnosis: Unsteadiness on feet (R26.81);Other abnormalities of gait and mobility (R26.89);Muscle weakness (generalized) (M62.81)     Time: 0093-8182 PT Time Calculation (min) (ACUTE ONLY): 29  min  Charges:  $Therapeutic Exercise: 8-22 mins $Therapeutic Activity: 8-22 mins                     1:50 PM, 07/14/19 Ocie Bob, MPT Physical Therapist with [facility] [phone] office [phone] mobile phone

## 2019-07-14 NOTE — Progress Notes (Signed)
Pt refused her Xarelto. Adv the importance of taking medication. She didn't care. She said that she would talk to the MD in the morning. She stated that I was trying to give her the wrong dose but when I asked her what was the correct dose she said it was confidential and would only speak with the doctor about it. Also, she wouldn't give me the information to her health care POA. She stated that was also confidential.

## 2019-07-14 NOTE — Progress Notes (Signed)
Patient Demographics:    [name], is a 85 y.o. female, DOB - 1935-03-02, DSK:[id number]  Admit date - 07/13/2019   Admitting Physician Albertine Patricia, MD  Outpatient Primary MD for the patient is Celene Squibb, MD  LOS - 0   Chief Complaint  Patient presents with  . Fall        Subjective:    [name] today has no fevers, no emesis,  No chest pain,  - Weak, oral intake is fair  Assessment  & Plan :    Active Problems:   Chronic diastolic CHF (congestive heart failure) (HCC)   PULMONARY EMBOLISM, HX OF   Parkinson's disease ([facility])   Physical debility   DNR (do not resuscitate) discussion   Ambulatory dysfunction  Brief Summary:- 85 y.o. female, a history of CHF, PE on Xarelto, Parkinson's disease, who tested positive for COVID-19 when seen here on December 26 presenting for evaluation of injury sustained in a fall admitted on 07/12/2018 with UTI and unwitnessed fall at home  A/p 1) generalized weakness, deconditioning/falls--- PT eval appreciated, patient now agreeable to SNF as long as she can go to [facility].  Social worker working on possible placement to [employer], PTA patient lived alone with private caregivers several hours a day but not around-the-clock (home care 8 hours/day, 8 AM -12 PM and 4 PM to 8 PM) -Total CK just over 1000  2) social/ethics--- DNR/DNI, plan of care discussed with patient's younger sister Ms. Laureen [phone]  3) possible UTI--- continue IV Rocephin pending culture results  4) history of PE--- continue Xarelto however if falls become more recurrent may have to consider discontinuation of Xarelto  5)HFpEF--appears compensated/euvolemic, hold Lasix for now  6) parkinsonian tremors--- does not appear to be on any medications PTA for Parkinson's  7) recent COVID-19 infection--- appears to be outside the window for isolation,---  asymptomatic at this time  Disposition/Need for in-Hospital Stay- patient unable to be discharged at this time due to awaiting transfer to SNF rehab -Unsafe situation at home with high risk for falls  Code Status : DNR  Family Communication:  (patient is alert, awake and coherent) plan of care discussed with patient's younger sister [name]- [phone]   Disposition Plan  : SNF Rehab  Consults  :  na  DVT Prophylaxis  : Xarelto- SCDs   Lab Results  Component Value Date   PLT 203 07/13/2019    Inpatient Medications  Scheduled Meds: . acetaminophen  650 mg Oral Once  . [START ON 07/15/2019] feeding supplement (ENSURE ENLIVE)  237 mL Oral Q24H  . multivitamin with minerals  1 tablet Oral Daily  . rivaroxaban  20 mg Oral Q supper   Continuous Infusions: . sodium chloride 50 mL/hr at 07/13/19 2303   PRN Meds:.acetaminophen **OR** acetaminophen    Anti-infectives (From admission, onward)   Start     Dose/Rate Route Frequency Ordered Stop   07/14/19 1800  cefTRIAXone (ROCEPHIN) 1 g in sodium chloride 0.9 % 100 mL IVPB     1 g 200 mL/hr over 30 Minutes Intravenous  Once 07/13/19 1932 07/14/19 1856   07/13/19 1500  cefTRIAXone (ROCEPHIN) 1 g in sodium chloride 0.9 % 100 mL IVPB  1 g 200 mL/hr over 30 Minutes Intravenous  Once 07/13/19 1453 07/13/19 1932        Objective:   Vitals:   07/13/19 1230 07/13/19 2216 07/13/19 2349 07/14/19 1800  BP: 131/64 (!) 135/93 132/73 132/84  Pulse:  93 (!) 103 (!) 102  Resp:  18 18 19   Temp:   99.6 F (37.6 C) 97.6 F (36.4 C)  TempSrc:   Oral Oral  SpO2:  95% 99% 100%  Weight:      Height:        Wt Readings from Last 3 Encounters:  07/13/19 54.4 kg  06/17/19 54.4 kg  05/12/19 66.6 kg     Intake/Output Summary (Last 24 hours) at 07/14/2019 1947 Last data filed at 07/14/2019 1600 Gross per 24 hour  Intake 731.71 ml  Output --  Net 731.71 ml     Physical Exam  Gen:- Awake Alert, in no acute  distress HEENT:- [place].AT, No sclera icterus Neck-Supple Neck,No JVD,.  Lungs-no wheezing, fair symmetrical air movement CV- S1, S2 normal, regular  Abd-  +ve B.Sounds, Abd Soft, No tenderness,    Extremity/Skin:- No  edema, pedal pulses present  Psych-affect is appropriate, oriented x3 Neuro-generalized weakness, obvious deformity /contractures no new focal deficits, parkinsonian tremors noted   Data Review:   Micro Results No results found for this or any previous visit (from the past 240 hour(s)).  Radiology Reports DG Knee 1-2 Views Right  Result Date: 07/13/2019 CLINICAL DATA:  Right knee pain. The patient was found down this morning. EXAM: RIGHT KNEE - 1-2 VIEW COMPARISON:  Radiographs dated 02/12/2005 FINDINGS: There is no fracture or dislocation or joint effusion. There is severe osteoarthritis of the medial and patellofemoral compartments with significant progression since 2006. IMPRESSION: No acute abnormality. 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 Electronically Signed   By: 2007 M.D.   On: 07/13/2019 10:13   DG Ankle Complete Right  Result Date: 07/13/2019 CLINICAL DATA:  Right ankle pain after the patient was found down this morning. EXAM: RIGHT ANKLE - COMPLETE 3+ VIEW COMPARISON:  Radiographs dated 12/21/2004 FINDINGS: There is no evidence of fracture, dislocation, or joint effusion. There is no evidence of significant arthropathy or other focal bone abnormality. Soft tissue prominence around the ankle and in the lower leg. IMPRESSION: No acute bone abnormality. Electronically Signed   By: 02/21/2005 M.D.   On: 07/13/2019 10:11   CT Knee Right Wo Contrast  Result Date: 07/13/2019 CLINICAL DATA:  Right knee pain, inability to bear weight. Evaluate for fracture EXAM: CT OF THE RIGHT KNEE WITHOUT CONTRAST TECHNIQUE: Multidetector CT imaging of the right knee was performed according to the standard protocol. Multiplanar CT image  reconstructions were also generated. COMPARISON:  Same day radiographs FINDINGS: Bones/Joint/Cartilage Osseous structures are diffusely demineralized. No acute fracture. No malalignment. Severe tricompartmental osteoarthritis most pronounced within the medial compartment with complete joint space loss, bony remodeling, and large marginal osteophyte formation. There are multiple ossified loose bodies at the posterior joint line. There is a small knee joint effusion without fat-fluid or hematocrit level. Ligaments Suboptimally assessed by CT. Muscles and Tendons No gross musculotendinous abnormality by CT. Soft tissues No soft tissue fluid collection or hematoma. Extensive vascular calcifications. IMPRESSION: 1. No acute fracture or malalignment. 2. Severe tricompartmental osteoarthritis most pronounced within the medial compartment. 3. Small knee joint effusion with multiple intra-articular loose bodies. Electronically Signed   By: 07/15/2019 D.O.   On: 07/13/2019 16:58  DG Chest Port 1 View  Result Date: 06/17/2019 CLINICAL DATA:  Shortness of breath with productive cough for 3 days. EXAM: PORTABLE CHEST 1 VIEW COMPARISON:  July 21, 2018 FINDINGS: The mediastinal contour is normal. The heart size is enlarged. Evaluation of the left lung base is limited due to overlying hand. The lungs are probably clear. The visualized skeletal structures are unremarkable. IMPRESSION: 1. The lungs are probably clear. 2. Evaluation of the left lung base is limited due to overlying hand. Electronically Signed   By: Sherian Rein M.D.   On: 06/17/2019 12:06   DG HIP UNILAT W OR W/O PELVIS 2-3 VIEWS RIGHT  Result Date: 07/13/2019 CLINICAL DATA:  Right hip pain. The patient was found down this morning. EXAM: DG HIP (WITH OR WITHOUT PELVIS) 2-3V RIGHT COMPARISON:  02/12/2005 FINDINGS: There is no fracture or dislocation. Osteopenia. Minimal degenerative changes of the right hip. IMPRESSION: No acute abnormality.  Osteopenia. Electronically Signed   By: Francene Boyers M.D.   On: 07/13/2019 10:15     CBC Recent Labs  Lab 07/13/19 1011  WBC 8.9  HGB 13.4  HCT 42.7  PLT 203  MCV 89.5  MCH 28.1  MCHC 31.4  RDW 14.4    Chemistries  Recent Labs  Lab 07/13/19 1011  NA 140  K 3.7  CL 108  CO2 23  GLUCOSE 115*  BUN 14  CREATININE 0.49  CALCIUM 8.5*   ------------------------------------------------------------------------------------------------------------------ No results for input(s): CHOL, HDL, LDLCALC, TRIG, CHOLHDL, LDLDIRECT in the last 72 hours.  No results found for: HGBA1C ------------------------------------------------------------------------------------------------------------------ No results for input(s): TSH, T4TOTAL, T3FREE, THYROIDAB in the last 72 hours.  Invalid input(s): FREET3 ------------------------------------------------------------------------------------------------------------------ No results for input(s): VITAMINB12, FOLATE, FERRITIN, TIBC, IRON, RETICCTPCT in the last 72 hours.  Coagulation profile No results for input(s): INR, PROTIME in the last 168 hours.  No results for input(s): DDIMER in the last 72 hours.  Cardiac Enzymes No results for input(s): CKMB, TROPONINI, MYOGLOBIN in the last 168 hours.  Invalid input(s): CK ------------------------------------------------------------------------------------------------------------------    Component Value Date/Time   BNP 180.0 (H) 04/08/2018 1521     Shon Hale M.D on 07/14/2019 at 7:47 PM  Go to [email or web address] - for contact info  [facility] - Office  [phone]

## 2019-07-14 NOTE — Progress Notes (Signed)
Pt is wet and is refusing to be cleaned up. Will offer to clean her up again after a little while.

## 2019-07-14 NOTE — Progress Notes (Signed)
Initial Nutrition Assessment  DOCUMENTATION CODES:   Not applicable  INTERVENTION:  Ensure Enlive po daily, each supplement provides 350 kcal and 20 grams of protein (chocolate)  Magic cup with dinner meal, each supplement provides 290 kcal and 9 grams of protein (chocolate)  MVI with minerals daily  Encouraged po intake   NUTRITION DIAGNOSIS:   Inadequate oral intake related to decreased appetite as evidenced by per patient/family report, energy intake < 75% for > or equal to 1 month.  GOAL:   Patient will meet greater than or equal to 90% of their needs    MONITOR:   PO intake, Weight trends, Supplement acceptance, Labs, I & O's  REASON FOR ASSESSMENT:   Malnutrition Screening Tool    ASSESSMENT:  RD working remotely.   85 year old female with past medical history of chronic diastolic CHF, h/o PE, Parkinson's disease, osteoarthritis, Covid-19 positive 12/26 who presented to ED for evaluation of ambulatory dysfunction/fall. Patient reports falling asleep in recliner and woke up to find herself sliding out of the chair onto her buttocks and unable to get off of the floor due to feeling weak. In ED, work-up significant for UA, no evidence of acute fracture on imaging, patient evaluated by PT and declined SNF recommendation.  Patient admitted for observation.  Per notes pt has home care 8 hours/day from 8am-12pm and 4pm to 8pm. SW consulted to assist with resuming home care prior to discharge.   Spoke with pt via phone this afternoon. She states that she has felt better in her day and endorses decreased appetite since having Covid. Patient recalls eating a blueberry muffin and quesadilla for breakfast and broccoli, creamed potatoes and a bite of [place] for lunch. She denies use of nutrition supplements at home, amenable to chocolate Ensure and Magic Cup during admission to aid with calorie/protein needs.   Non-pitting BLE edema noted per RN flowsheet.  Current wt 54.4 kg  (119.68 lbs)  Weight history reviewed, questionable 26.84 lb (18.3%) loss in the past 2 months which is severe for time frame. On 11/20 pt wt 66.6 kg (146.52 lbs) Per history, pt wt on 07/01/18 also 66.6 kg. November wt could possibly be stated, as well as patient's current weight given 12/26 wt was 54.4 kg. Would expect some weight loss due to recent catabolic COVID-19 virus infection. Recommend obtaining new weight to accurately assess status.   Medications and labs reviewed.  NUTRITION - FOCUSED PHYSICAL EXAM: Unable to complete at this time, RD working remotely.   Diet Order:   Diet Order            Diet regular Room service appropriate? Yes; Fluid consistency: Thin  Diet effective now              EDUCATION NEEDS:   No education needs have been identified at this time  Skin:  Skin Assessment: Reviewed RN Assessment  Last BM:  Unknown  Height:   Ht Readings from Last 1 Encounters:  07/13/19 5\' 2"  (1.575 m)    Weight:   Wt Readings from Last 1 Encounters:  07/13/19 54.4 kg    Ideal Body Weight:  50 kg  BMI:  Body mass index is 21.95 kg/m.  Estimated Nutritional Needs:   Kcal:  1700-1900  Protein:  85-95  Fluid:  >1.3 L/day   07/15/19, RD, LDN Clinical Nutrition [facility] Telephone [phone] After Hours/Weekend Pager: [phone]

## 2019-07-14 NOTE — Progress Notes (Signed)
OT Cancellation Note  Patient Details Name: [name] MRN: [id number] DOB: [birth year]   Cancelled Treatment:    Reason Eval/Treat Not Completed: Patient declined, no reason specified. OT familiar with pt from prior admissions. Attempted to see pt this am, pt reports less pain in RLE however refusing to attempt to mobilize. Pt unable to explain how she completes ADLs when aide is not available, when asked if family present stating "I don't need them." Pt requesting OT to return at a later time when she did not want to answer difficult questions-such as ADL completion without assistance. OT redirecting multiple times and attempting to engage pt, however pt continued to refuse participation and asked OT to return later. Pt is unable to complete ADLs without assistance at baseline, has aide 8 hrs/day, 4 hrs in the am and 4 hrs in the pm. Pt has poor mobility at baseline due to parkinson's disease. Pt needs 24/7 assistance however refuses. Recommend SNF with transition to LTC, however pt refusing SNF. Will attempt to check back on pt at a later time.    Ezra Sites, OTR/L  [phone] 07/14/2019, 7:50 AM

## 2019-07-15 DIAGNOSIS — Z91041 Radiographic dye allergy status: Secondary | ICD-10-CM | POA: Diagnosis not present

## 2019-07-15 DIAGNOSIS — R4689 Other symptoms and signs involving appearance and behavior: Secondary | ICD-10-CM | POA: Diagnosis present

## 2019-07-15 DIAGNOSIS — Z86711 Personal history of pulmonary embolism: Secondary | ICD-10-CM | POA: Diagnosis not present

## 2019-07-15 DIAGNOSIS — Z7189 Other specified counseling: Secondary | ICD-10-CM | POA: Diagnosis not present

## 2019-07-15 DIAGNOSIS — I5032 Chronic diastolic (congestive) heart failure: Secondary | ICD-10-CM

## 2019-07-15 DIAGNOSIS — W19XXXA Unspecified fall, initial encounter: Secondary | ICD-10-CM

## 2019-07-15 DIAGNOSIS — Z66 Do not resuscitate: Secondary | ICD-10-CM | POA: Diagnosis present

## 2019-07-15 DIAGNOSIS — R262 Difficulty in walking, not elsewhere classified: Secondary | ICD-10-CM | POA: Diagnosis not present

## 2019-07-15 DIAGNOSIS — Z881 Allergy status to other antibiotic agents status: Secondary | ICD-10-CM | POA: Diagnosis not present

## 2019-07-15 DIAGNOSIS — Z7901 Long term (current) use of anticoagulants: Secondary | ICD-10-CM | POA: Diagnosis not present

## 2019-07-15 DIAGNOSIS — R5381 Other malaise: Secondary | ICD-10-CM | POA: Diagnosis present

## 2019-07-15 DIAGNOSIS — Z886 Allergy status to analgesic agent status: Secondary | ICD-10-CM | POA: Diagnosis not present

## 2019-07-15 DIAGNOSIS — M6282 Rhabdomyolysis: Secondary | ICD-10-CM | POA: Diagnosis present

## 2019-07-15 DIAGNOSIS — R531 Weakness: Secondary | ICD-10-CM | POA: Diagnosis present

## 2019-07-15 DIAGNOSIS — Z8249 Family history of ischemic heart disease and other diseases of the circulatory system: Secondary | ICD-10-CM | POA: Diagnosis not present

## 2019-07-15 DIAGNOSIS — G20 Parkinson's disease: Secondary | ICD-10-CM | POA: Diagnosis present

## 2019-07-15 DIAGNOSIS — Z8616 Personal history of COVID-19: Secondary | ICD-10-CM | POA: Diagnosis not present

## 2019-07-15 LAB — URINE CULTURE: Culture: NO GROWTH

## 2019-07-15 NOTE — Progress Notes (Signed)
Pt confused this am. I went in to check on her and to ask if we could finally clean her up. Pt screamed at me telling me to get out of her house or she was going to call the police. Tried to reorient patient. Adv she was at the hospital. Reminded patient who I was. Pt reluctantly allow [place] to clean but complained the entire time. She then became real upset when we placed the hospital gown on her. She stated she could not wear it because she was allergic to the color. She started trying to hit at [place] and scratched the NT. Mittens were placed on the patient. Pt is now clean. Will continue to monitor patient.

## 2019-07-15 NOTE — Progress Notes (Signed)
Patient more alert this am. Patient stated who she was, where she was at and the year it was. Call bell in reach. Will continue to assess.

## 2019-07-15 NOTE — Progress Notes (Addendum)
Patient Demographics:    [name], is a 85 y.o. female, DOB - [birth year], DEY:[id number]  Admit date - 07/13/2019   Admitting Physician Erick Blinks, MD  Outpatient Primary MD for the patient is [name], MD  LOS - 0   Chief Complaint  Patient presents with  . Fall        Subjective:   No chest pain, shortness of breath.  Feels generally weak.  Assessment  & Plan :    Active Problems:   Generalized weakness   Chronic diastolic CHF (congestive heart failure) (HCC)   PULMONARY EMBOLISM, HX OF   Parkinson's disease (HCC)   Physical debility   DNR (do not resuscitate) discussion   Ambulatory dysfunction  Brief Summary:- 85 y.o. female, a history of CHF, PE on Xarelto, Parkinson's disease, who tested positive for COVID-19 when seen here on December 26 presenting for evaluation of injury sustained in a fall admitted on 07/12/2018 with UTI and unwitnessed fall at home  A/p 1) generalized weakness, deconditioning/falls--- PT eval appreciated, patient now agreeable to SNF as long as she can go to [facility].  Social worker working on possible placement to [employer], PTA patient lived alone with private caregivers several hours a day but not around-the-clock (home care 8 hours/day, 8 AM -12 PM and 4 PM to 8 PM) -Total CK just over 1000  2) social/ethics--- DNR/DNI, plan of care discussed with patient's younger sister Ms. Quintella [phone]  3) possible UTI--- continue IV Rocephin pending culture results  4) history of PE--- continue Xarelto however if falls become more recurrent may have to consider discontinuation of Xarelto  5)HFpEF--appears compensated/euvolemic, hold Lasix for now  6) parkinsonian tremors--- does not appear to be on any medications PTA for Parkinson's  7) recent COVID-19 infection--- appears to be outside the window for isolation,--- asymptomatic at this  time  Disposition/Need for in-Hospital Stay- patient unable to be discharged at this time due to awaiting transfer to SNF rehab -Unsafe situation at home with high risk for falls  Code Status : DNR  Family Communication:  (patient is alert, awake and coherent) plan of care discussed with patient's younger sister Ms. Quintella Reichert- [phone]   Disposition Plan  : SNF Rehab  Consults  :  na  DVT Prophylaxis  : Xarelto- SCDs   Lab Results  Component Value Date   PLT 203 07/13/2019    Inpatient Medications  Scheduled Meds: . acetaminophen  650 mg Oral Once  . feeding supplement (ENSURE ENLIVE)  237 mL Oral Q24H  . multivitamin with minerals  1 tablet Oral Daily  . rivaroxaban  20 mg Oral Q supper   Continuous Infusions: . sodium chloride 50 mL/hr at 07/14/19 2005  . cefTRIAXone (ROCEPHIN)  IV 1 g (07/15/19 1729)   PRN Meds:.acetaminophen **OR** acetaminophen    Anti-infectives (From admission, onward)   Start     Dose/Rate Route Frequency Ordered Stop   07/15/19 1800  cefTRIAXone (ROCEPHIN) 1 g in sodium chloride 0.9 % 100 mL IVPB     1 g 200 mL/hr over 30 Minutes Intravenous Every 24 hours 07/14/19 1951     07/14/19 1800  cefTRIAXone (ROCEPHIN) 1 g in sodium chloride 0.9 % 100 mL IVPB  1 g 200 mL/hr over 30 Minutes Intravenous  Once 07/13/19 1932 07/14/19 1856   07/13/19 1500  cefTRIAXone (ROCEPHIN) 1 g in sodium chloride 0.9 % 100 mL IVPB     1 g 200 mL/hr over 30 Minutes Intravenous  Once 07/13/19 1453 07/13/19 1932        Objective:   Vitals:   07/14/19 1946 07/14/19 2247 07/15/19 0808 07/15/19 1957  BP:  139/80    Pulse:  85    Resp:  20    Temp:  98.3 F (36.8 C)    TempSrc:  Oral    SpO2: 94% 100% 98% 98%  Weight:      Height:        Wt Readings from Last 3 Encounters:  07/13/19 54.4 kg  06/17/19 54.4 kg  05/12/19 66.6 kg    No intake or output data in the 24 hours ending 07/15/19 2022   Physical Exam  General exam: Alert,  awake, oriented x 3 Respiratory system: Clear to auscultation. Respiratory effort normal. Cardiovascular system:RRR. No murmurs, rubs, gallops. Gastrointestinal system: Abdomen is nondistended, soft and nontender. No organomegaly or masses felt. Normal bowel sounds heard. Central nervous system: Alert and oriented. No focal neurological deficits. Extremities: No C/C/E, +pedal pulses Skin: No rashes, lesions or ulcers Psychiatry: Judgement and insight appear normal. Mood & affect appropriate.     Data Review:   Micro Results Recent Results (from the past 240 hour(s))  Urine culture     Status: None   Collection Time: 07/13/19 11:52 AM   Specimen: Urine, Random  Result Value Ref Range Status   Specimen Description   Final    URINE, RANDOM Performed at [facility], [address], [place], [place] [address]    Special Requests   Final    NONE Performed at [facility], [address] [place], [place] [address]    Culture   Final    NO GROWTH Performed at [facility], [address], [place], [place] [address]    Report Status 07/15/2019 FINAL  Final    Radiology Reports DG Knee 1-2 Views Right  Result Date: 07/13/2019 CLINICAL DATA:  Right knee pain. The patient was found down this morning. EXAM: RIGHT KNEE - 1-2 VIEW COMPARISON:  Radiographs dated 02/12/2005 FINDINGS: There is no fracture or dislocation or joint effusion. There is severe osteoarthritis of the medial and patellofemoral compartments with significant progression since 2006. IMPRESSION: No acute abnormality. 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 Electronically Signed   By: Francene Boyers M.D.   On: 07/13/2019 10:13   DG Ankle Complete Right  Result Date: 07/13/2019 CLINICAL DATA:  Right ankle pain after the patient was found down this morning. EXAM: RIGHT ANKLE - COMPLETE 3+ VIEW COMPARISON:  Radiographs dated 12/21/2004 FINDINGS: There is no evidence of fracture, dislocation,  or joint effusion. There is no evidence of significant arthropathy or other focal bone abnormality. Soft tissue prominence around the ankle and in the lower leg. IMPRESSION: No acute bone abnormality. Electronically Signed   By: Francene Boyers M.D.   On: 07/13/2019 10:11   CT Knee Right Wo Contrast  Result Date: 07/13/2019 CLINICAL DATA:  Right knee pain, inability to bear weight. Evaluate for fracture EXAM: CT OF THE RIGHT KNEE WITHOUT CONTRAST TECHNIQUE: Multidetector CT imaging of the right knee was performed according to the standard protocol. Multiplanar CT image reconstructions were also generated. COMPARISON:  Same day radiographs FINDINGS: Bones/Joint/Cartilage Osseous structures are diffusely  demineralized. No acute fracture. No malalignment. Severe tricompartmental osteoarthritis most pronounced within the medial compartment with complete joint space loss, bony remodeling, and large marginal osteophyte formation. There are multiple ossified loose bodies at the posterior joint line. There is a small knee joint effusion without fat-fluid or hematocrit level. Ligaments Suboptimally assessed by CT. Muscles and Tendons No gross musculotendinous abnormality by CT. Soft tissues No soft tissue fluid collection or hematoma. Extensive vascular calcifications. IMPRESSION: 1. No acute fracture or malalignment. 2. Severe tricompartmental osteoarthritis most pronounced within the medial compartment. 3. Small knee joint effusion with multiple intra-articular loose bodies. Electronically Signed   By: Davina Poke D.O.   On: 07/13/2019 16:58   DG Chest Port 1 View  Result Date: 06/17/2019 CLINICAL DATA:  Shortness of breath with productive cough for 3 days. EXAM: PORTABLE CHEST 1 VIEW COMPARISON:  July 21, 2018 FINDINGS: The mediastinal contour is normal. The heart size is enlarged. Evaluation of the left lung base is limited due to overlying hand. The lungs are probably clear. The visualized skeletal  structures are unremarkable. IMPRESSION: 1. The lungs are probably clear. 2. Evaluation of the left lung base is limited due to overlying hand. Electronically Signed   By: Abelardo Diesel M.D.   On: 06/17/2019 12:06   DG HIP UNILAT W OR W/O PELVIS 2-3 VIEWS RIGHT  Result Date: 07/13/2019 CLINICAL DATA:  Right hip pain. The patient was found down this morning. EXAM: DG HIP (WITH OR WITHOUT PELVIS) 2-3V RIGHT COMPARISON:  02/12/2005 FINDINGS: There is no fracture or dislocation. Osteopenia. Minimal degenerative changes of the right hip. IMPRESSION: No acute abnormality. Osteopenia. Electronically Signed   By: Lorriane Shire M.D.   On: 07/13/2019 10:15     CBC Recent Labs  Lab 07/13/19 1011  WBC 8.9  HGB 13.4  HCT 42.7  PLT 203  MCV 89.5  MCH 28.1  MCHC 31.4  RDW 14.4    Chemistries  Recent Labs  Lab 07/13/19 1011  NA 140  K 3.7  CL 108  CO2 23  GLUCOSE 115*  BUN 14  CREATININE 0.49  CALCIUM 8.5*   ------------------------------------------------------------------------------------------------------------------ No results for input(s): CHOL, HDL, LDLCALC, TRIG, CHOLHDL, LDLDIRECT in the last 72 hours.  No results found for: HGBA1C ------------------------------------------------------------------------------------------------------------------ No results for input(s): TSH, T4TOTAL, T3FREE, THYROIDAB in the last 72 hours.  Invalid input(s): FREET3 ------------------------------------------------------------------------------------------------------------------ No results for input(s): VITAMINB12, FOLATE, FERRITIN, TIBC, IRON, RETICCTPCT in the last 72 hours.  Coagulation profile No results for input(s): INR, PROTIME in the last 168 hours.  No results for input(s): DDIMER in the last 72 hours.  Cardiac Enzymes No results for input(s): CKMB, TROPONINI, MYOGLOBIN in the last 168 hours.  Invalid input(s):  CK ------------------------------------------------------------------------------------------------------------------    Component Value Date/Time   BNP 180.0 (H) 04/08/2018 1521     Kathie Dike M.D on 07/15/2019 at 8:22 PM  Go to [email or web address] - for contact info  [facility] - Office  [phone]

## 2019-07-15 NOTE — Progress Notes (Signed)
Pt has been asked two more times to allow [place] to clean her up. She is sitting in urine. She is very upset and hitting staff when they try to pull the covers back. She said she doesn't want to be messed with. Pt was educated on the risks of sitting on wet sheets. She said "I don't care God damn it!" Adv that her skin could break down and she would develop a bed sore. Pt started yelling "Get OUT" continuously. Will offer to clean her up again after a while.

## 2019-07-16 NOTE — Plan of Care (Signed)
Plan of Care Reviewed 

## 2019-07-16 NOTE — Progress Notes (Signed)
Patient Demographics:    [name], is a 85 y.o. female, DOB - 1934-12-25, HEN:[id number]  Admit date - 07/13/2019   Admitting Physician Kathie Dike, MD  Outpatient Primary MD for the patient is Celene Squibb, MD  LOS - 1   Chief Complaint  Patient presents with  . Fall        Subjective:   Does not have any new complaints.  No shortness of breath or chest pain.  Assessment  & Plan :    Active Problems:   Generalized weakness   Chronic diastolic CHF (congestive heart failure) (HCC)   PULMONARY EMBOLISM, HX OF   Parkinson's disease ([facility])   Physical debility   DNR (do not resuscitate) discussion   Ambulatory dysfunction  Brief Summary:- 85 y.o. female, a history of CHF, PE on Xarelto, Parkinson's disease, who tested positive for COVID-19 when seen here on December 26 presenting for evaluation of injury sustained in a fall admitted on 07/12/2018 with UTI and unwitnessed fall at home  A/p 1) generalized weakness, deconditioning/falls--- PT eval appreciated, patient now agreeable to SNF as long as she can go to [facility].  Social worker working on possible placement to [employer], PTA patient lived alone with private caregivers several hours a day but not around-the-clock (home care 8 hours/day, 8 AM -12 PM and 4 PM to 8 PM)  2) social/ethics--- DNR/DNI, plan of care discussed with patient's younger sister Ms. Laureen [phone]  3) possible UTI--- urine culture shows no growth.  Rocephin discontinued.  4) history of PE--- continue Xarelto however if falls become more recurrent may have to consider discontinuation of Xarelto  5)HFpEF--appears compensated/euvolemic, hold Lasix for now  6) parkinsonian tremors--- does not appear to be on any medications PTA for Parkinson's  7) recent COVID-19 infection--- appears to be outside the window for isolation,--- asymptomatic at this  time  8) mild rhabdomyolysis.  She does have a history of falls.  CK trending up to 1060.  Continue IV fluids and trend levels.  Disposition/Need for in-Hospital Stay- patient unable to be discharged at this time due to awaiting transfer to SNF rehab -Unsafe situation at home with high risk for falls  Code Status : DNR  Family Communication:  (patient is alert, awake and coherent) plan of care discussed with patient's younger sister Ms. Laureen Ochs- [phone]   Disposition Plan  : SNF Rehab  Consults  :  na  DVT Prophylaxis  : Xarelto- SCDs   Lab Results  Component Value Date   PLT 203 07/13/2019    Inpatient Medications  Scheduled Meds: . acetaminophen  650 mg Oral Once  . feeding supplement (ENSURE ENLIVE)  237 mL Oral Q24H  . multivitamin with minerals  1 tablet Oral Daily  . rivaroxaban  20 mg Oral Q supper   Continuous Infusions: . sodium chloride 100 mL/hr at 07/16/19 1211   PRN Meds:.acetaminophen **OR** acetaminophen    Anti-infectives (From admission, onward)   Start     Dose/Rate Route Frequency Ordered Stop   07/15/19 1800  cefTRIAXone (ROCEPHIN) 1 g in sodium chloride 0.9 % 100 mL IVPB  Status:  Discontinued     1 g 200 mL/hr over 30 Minutes Intravenous Every 24 hours 07/14/19  1951 07/16/19 1204   07/14/19 1800  cefTRIAXone (ROCEPHIN) 1 g in sodium chloride 0.9 % 100 mL IVPB     1 g 200 mL/hr over 30 Minutes Intravenous  Once 07/13/19 1932 07/14/19 1856   07/13/19 1500  cefTRIAXone (ROCEPHIN) 1 g in sodium chloride 0.9 % 100 mL IVPB     1 g 200 mL/hr over 30 Minutes Intravenous  Once 07/13/19 1453 07/13/19 1932        Objective:   Vitals:   07/15/19 2116 07/16/19 0620 07/16/19 0754 07/16/19 1538  BP: 130/67 133/78  (!) 148/73  Pulse: 99 80  93  Resp:    18  Temp: 98.1 F (36.7 C) 99 F (37.2 C)  97.7 F (36.5 C)  TempSrc: Oral Oral  Oral  SpO2: 97% 100% 96% 100%  Weight:      Height:        Wt Readings from Last 3 Encounters:   07/13/19 54.4 kg  06/17/19 54.4 kg  05/12/19 66.6 kg     Intake/Output Summary (Last 24 hours) at 07/16/2019 1817 Last data filed at 07/16/2019 1700 Gross per 24 hour  Intake 500 ml  Output 400 ml  Net 100 ml     Physical Exam  General exam: Alert, awake, oriented x 3 Respiratory system: Clear to auscultation. Respiratory effort normal. Cardiovascular system:RRR. No murmurs, rubs, gallops. Gastrointestinal system: Abdomen is nondistended, soft and nontender. No organomegaly or masses felt. Normal bowel sounds heard. Central nervous system: Alert and oriented. No focal neurological deficits. Extremities: No C/C/E, +pedal pulses Skin: No rashes, lesions or ulcers Psychiatry: Judgement and insight appear normal. Mood & affect appropriate.     Data Review:   Micro Results Recent Results (from the past 240 hour(s))  Urine culture     Status: None   Collection Time: 07/13/19 11:52 AM   Specimen: Urine, Random  Result Value Ref Range Status   Specimen Description   Final    URINE, RANDOM Performed at [facility], [address] [place], [place] [address]    Special Requests   Final    NONE Performed at [facility], [address], [place], [place] [address]    Culture   Final    NO GROWTH Performed at [facility], [address], [place], [place] [address]    Report Status 07/15/2019 FINAL  Final    Radiology Reports DG Knee 1-2 Views Right  Result Date: 07/13/2019 CLINICAL DATA:  Right knee pain. The patient was found down this morning. EXAM: RIGHT KNEE - 1-2 VIEW COMPARISON:  Radiographs dated 02/12/2005 FINDINGS: There is no fracture or dislocation or joint effusion. There is severe osteoarthritis of the medial and patellofemoral compartments with significant progression since 2006. IMPRESSION: No acute abnormality. 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 Electronically Signed   By: Francene Boyers M.D.   On: 07/13/2019 10:13   DG  Ankle Complete Right  Result Date: 07/13/2019 CLINICAL DATA:  Right ankle pain after the patient was found down this morning. EXAM: RIGHT ANKLE - COMPLETE 3+ VIEW COMPARISON:  Radiographs dated 12/21/2004 FINDINGS: There is no evidence of fracture, dislocation, or joint effusion. There is no evidence of significant arthropathy or other focal bone abnormality. Soft tissue prominence around the ankle and in the lower leg. IMPRESSION: No acute bone abnormality. Electronically Signed   By: Francene Boyers M.D.   On: 07/13/2019 10:11   CT Knee Right Wo Contrast  Result Date: 07/13/2019 CLINICAL DATA:  Right knee pain, inability to bear weight. Evaluate for fracture EXAM: CT OF THE RIGHT KNEE WITHOUT CONTRAST TECHNIQUE: Multidetector CT imaging of the right knee was performed according to the standard protocol. Multiplanar CT image reconstructions were also generated. COMPARISON:  Same day radiographs FINDINGS: Bones/Joint/Cartilage Osseous structures are diffusely demineralized. No acute fracture. No malalignment. Severe tricompartmental osteoarthritis most pronounced within the medial compartment with complete joint space loss, bony remodeling, and large marginal osteophyte formation. There are multiple ossified loose bodies at the posterior joint line. There is a small knee joint effusion without fat-fluid or hematocrit level. Ligaments Suboptimally assessed by CT. Muscles and Tendons No gross musculotendinous abnormality by CT. Soft tissues No soft tissue fluid collection or hematoma. Extensive vascular calcifications. IMPRESSION: 1. No acute fracture or malalignment. 2. Severe tricompartmental osteoarthritis most pronounced within the medial compartment. 3. Small knee joint effusion with multiple intra-articular loose bodies. Electronically Signed   By: Duanne Guess D.O.   On: 07/13/2019 16:58   DG Chest Port 1 View  Result Date: 06/17/2019 CLINICAL DATA:  Shortness of breath with productive cough for 3  days. EXAM: PORTABLE CHEST 1 VIEW COMPARISON:  July 21, 2018 FINDINGS: The mediastinal contour is normal. The heart size is enlarged. Evaluation of the left lung base is limited due to overlying hand. The lungs are probably clear. The visualized skeletal structures are unremarkable. IMPRESSION: 1. The lungs are probably clear. 2. Evaluation of the left lung base is limited due to overlying hand. Electronically Signed   By: Sherian Rein M.D.   On: 06/17/2019 12:06   DG HIP UNILAT W OR W/O PELVIS 2-3 VIEWS RIGHT  Result Date: 07/13/2019 CLINICAL DATA:  Right hip pain. The patient was found down this morning. EXAM: DG HIP (WITH OR WITHOUT PELVIS) 2-3V RIGHT COMPARISON:  02/12/2005 FINDINGS: There is no fracture or dislocation. Osteopenia. Minimal degenerative changes of the right hip. IMPRESSION: No acute abnormality. Osteopenia. Electronically Signed   By: Francene Boyers M.D.   On: 07/13/2019 10:15     CBC Recent Labs  Lab 07/13/19 1011  WBC 8.9  HGB 13.4  HCT 42.7  PLT 203  MCV 89.5  MCH 28.1  MCHC 31.4  RDW 14.4    Chemistries  Recent Labs  Lab 07/13/19 1011  NA 140  K 3.7  CL 108  CO2 23  GLUCOSE 115*  BUN 14  CREATININE 0.49  CALCIUM 8.5*   ------------------------------------------------------------------------------------------------------------------ No results for input(s): CHOL, HDL, LDLCALC, TRIG, CHOLHDL, LDLDIRECT in the last 72 hours.  No results found for: HGBA1C ------------------------------------------------------------------------------------------------------------------ No results for input(s): TSH, T4TOTAL, T3FREE, THYROIDAB in the last 72 hours.  Invalid input(s): FREET3 ------------------------------------------------------------------------------------------------------------------ No results for input(s): VITAMINB12, FOLATE, FERRITIN, TIBC, IRON, RETICCTPCT in the last 72 hours.  Coagulation profile No results for input(s): INR, PROTIME in the  last 168 hours.  No results for input(s): DDIMER in the last 72 hours.  Cardiac Enzymes No results for input(s): CKMB, TROPONINI, MYOGLOBIN in the last 168 hours.  Invalid input(s): CK ------------------------------------------------------------------------------------------------------------------    Component Value Date/Time   BNP 180.0 (H) 04/08/2018 1521     Erick Blinks M.D on 07/16/2019 at 6:17 PM  Go to [email or web address] - for contact info  [facility] - Office  [phone]

## 2019-07-16 NOTE — NC FL2 (Signed)
[place] LEVEL OF CARE SCREENING TOOL     IDENTIFICATION  Patient Name: [name] Birthdate: 03-17-1935 Sex: female Admission Date (Current Location): 07/13/2019  [facility] and [place] Number:  [employer] and Address:  [place] [address], [place]      Provider Number: [id number]  Attending Physician Name and Address:  Kathie Dike, MD  Relative Name and Phone Number:       Current Level of Care: Hospital Recommended Level of Care: [place] Prior Approval Number: [id number]  Date Approved/Denied:   PASRR Number:    Discharge Plan: SNF    Current Diagnoses: Patient Active Problem List   Diagnosis Date Noted  . Ambulatory dysfunction 07/13/2019  . Goals of care, counseling/discussion   . Palliative care by specialist   . DNR (do not resuscitate) discussion   . Coagulopathy ([place]) 03/19/2018  . Elevated INR   . Supratherapeutic INR 02/24/2018  . Physical debility 02/24/2018  . Folliculitis of perineum 12/15/2017  . Streptococcal bacteremia 07/09/2017  . Cellulitis of lower extremity   . Bacteremia due to Gram-positive bacteria 07/06/2017  . Cellulitis and abscess of left leg 07/05/2017  . Vulvar abscess 07/05/2017  . Infected cyst of Bartholin's gland duct 07/05/2017  . Weakness 12/21/2014  . Leg weakness 12/20/2014  . Lower extremity weakness 12/20/2014  . Precordial pain 12/20/2014  . Diarrhea   . Chest pain 11/28/2014  . Pressure ulcer, heel 11/28/2014  . AKI (acute kidney injury) ([place]) 11/28/2014  . Chronic diastolic congestive heart failure ([place]) 11/28/2014  . Parkinson's disease ([place]) 11/28/2014  . Pain in the chest   . Encounter for therapeutic drug monitoring 09/15/2013  . Long term (current) use of anticoagulants 09/22/2010  . Pulmonary embolism ([place]) 08/25/2010  . Generalized weakness 08/16/2009  . LEG EDEMA 08/16/2009  . Chronic diastolic CHF (congestive heart  failure) ([place]) 08/16/2009  . Venous (peripheral) insufficiency 11/15/2008  . PULMONARY EMBOLISM, HX OF 11/15/2008    Orientation RESPIRATION BLADDER Height & Weight     Self, Time, Situation  Normal External catheter Weight: 54.4 kg Height:  5\' 2"  (157.5 cm)  BEHAVIORAL SYMPTOMS/MOOD NEUROLOGICAL BOWEL NUTRITION STATUS      Continent Diet(see DC summary)  AMBULATORY STATUS COMMUNICATION OF NEEDS Skin   Extensive Assist Verbally Normal                       Personal Care Assistance Level of Assistance  Bathing, Feeding, Dressing Bathing Assistance: Maximum assistance Feeding assistance: Limited assistance Dressing Assistance: Maximum assistance     Functional Limitations Info  Sight, Speech, Hearing Sight Info: Impaired(will need to be in line of vision)) Hearing Info: Adequate Speech Info: Adequate    SPECIAL CARE FACTORS FREQUENCY  PT (By licensed PT)     PT Frequency: 5x/week              Contractures      Additional Factors Info  Code Status, Allergies Code Status Info: DNR Allergies Info: Contrast Media -iodinated Diagnostic Agents; vioxx;Vancomycin           Current Medications (07/16/2019):  This is the current hospital active medication list Current Facility-Administered Medications  Medication Dose Route Frequency Provider Last Rate Last Admin  . 0.9 %  sodium chloride infusion   Intravenous Continuous Kathie Dike, MD 100 mL/hr at 07/16/19 1211 Rate Change at 07/16/19 1211  . acetaminophen (TYLENOL) tablet 650 mg  650 mg Oral  Q6H PRN Elgergawy, Leana Roe, MD   650 mg at 07/15/19 1954   Or  . acetaminophen (TYLENOL) suppository 650 mg  650 mg Rectal Q6H PRN Elgergawy, Leana Roe, MD      . acetaminophen (TYLENOL) tablet 650 mg  650 mg Oral Once Elgergawy, Leana Roe, MD      . feeding supplement (ENSURE ENLIVE) (ENSURE ENLIVE) liquid 237 mL  237 mL Oral Q24H Emokpae, Courage, MD      . multivitamin with minerals tablet 1 tablet  1 tablet Oral  Daily Shon Hale, MD   1 tablet at 07/16/19 0931  . rivaroxaban (XARELTO) tablet 20 mg  20 mg Oral Q supper Elgergawy, Leana Roe, MD   20 mg at 07/15/19 1726     Discharge Medications: Please see discharge summary for a list of discharge medications.  Relevant Imaging Results:  Relevant Lab Results:   Additional Information SSN [id number]  Kirstyn Lean, Chrystine Oiler, RN

## 2019-07-17 ENCOUNTER — Other Ambulatory Visit: Payer: Self-pay

## 2019-07-17 ENCOUNTER — Encounter (HOSPITAL_COMMUNITY): Payer: Self-pay | Admitting: *Deleted

## 2019-07-17 ENCOUNTER — Emergency Department (HOSPITAL_COMMUNITY)
Admission: EM | Admit: 2019-07-17 | Discharge: 2019-07-18 | Disposition: A | Discharge status: Home / Self Care | Payer: Medicare Other | Attending: Emergency Medicine | Admitting: Emergency Medicine

## 2019-07-17 DIAGNOSIS — I5032 Chronic diastolic (congestive) heart failure: Secondary | ICD-10-CM | POA: Insufficient documentation

## 2019-07-17 DIAGNOSIS — R4689 Other symptoms and signs involving appearance and behavior: Secondary | ICD-10-CM | POA: Diagnosis not present

## 2019-07-17 DIAGNOSIS — R5381 Other malaise: Principal | ICD-10-CM

## 2019-07-17 DIAGNOSIS — G20 Parkinson's disease: Secondary | ICD-10-CM | POA: Insufficient documentation

## 2019-07-17 LAB — BASIC METABOLIC PANEL
Anion gap: 6 (ref 5–15)
BUN: 12 mg/dL (ref 8–23)
CO2: 26 mmol/L (ref 22–32)
Calcium: 8.4 mg/dL — ABNORMAL LOW (ref 8.9–10.3)
Chloride: 109 mmol/L (ref 98–111)
Creatinine, Ser: 0.47 mg/dL (ref 0.44–1.00)
GFR calc Af Amer: >60 mL/min (ref >60)
GFR calc non Af Amer: >60 mL/min (ref >60)
Glucose, Bld: 92 mg/dL (ref 70–99)
Potassium: 3.8 mmol/L (ref 3.5–5.1)
Sodium: 141 mmol/L (ref 135–145)

## 2019-07-17 LAB — CK: Total CK: 177 U/L (ref 38–234)

## 2019-07-17 MED ORDER — ACETAMINOPHEN 325 MG PO TABS: 650.0000 mg | ORAL_TABLET | Freq: Four times a day (QID) | ORAL | 0 refills | Status: DC | PRN

## 2019-07-17 MED ORDER — ADULT MULTIVITAMIN W/MINERALS CH: 1.0000 | ORAL_TABLET | Freq: Every day | ORAL | 2 refills | Status: DC

## 2019-07-17 MED ORDER — RIVAROXABAN 20 MG PO TABS: 20.0000 mg | ORAL_TABLET | Freq: Every day | ORAL | 1 refills | Status: AC

## 2019-07-17 MED ORDER — ENSURE ENLIVE PO LIQD: 237.0000 mL | Freq: Two times a day (BID) | ORAL | 2 refills | Status: DC

## 2019-07-17 NOTE — ED Provider Notes (Signed)
[facility] EMERGENCY DEPARTMENT Provider Note   CSN: [id number] Arrival date & time: 07/17/19  2024     History Chief Complaint  Patient presents with  . Aggressive Behavior    [name] is a 85 y.o. female.  Patient sent over from the [facility] for aggressive behavior.  Patient was just discharged from [facility] today.  She was an inpatient from January 21 to January 25.  Weakness congestive heart failure chronic diastolic history of pulmonary embolus on blood thinners Parkinson disease.  And ambulatory dysfunction was her discharge diagnosis.  Apparently at the nursing home patient became very upset and elicited aggressive behavior.  Here patient has been very cooperative very pleasant.        Past Medical History:  Diagnosis Date  . CHF (congestive heart failure) ([place])   . History of pulmonary embolism   . Parkinson's disease ([place])   . Venous stasis     Patient Active Problem List   Diagnosis Date Noted  . Ambulatory dysfunction 07/13/2019  . Goals of care, counseling/discussion   . Palliative care by specialist   . DNR (do not resuscitate) discussion   . Coagulopathy ([place]) 03/19/2018  . Elevated INR   . Supratherapeutic INR 02/24/2018  . Physical debility 02/24/2018  . Folliculitis of perineum 12/15/2017  . Streptococcal bacteremia 07/09/2017  . Cellulitis of lower extremity   . Bacteremia due to Gram-positive bacteria 07/06/2017  . Cellulitis and abscess of left leg 07/05/2017  . Vulvar abscess 07/05/2017  . Infected cyst of Bartholin's gland duct 07/05/2017  . Weakness 12/21/2014  . Leg weakness 12/20/2014  . Lower extremity weakness 12/20/2014  . Precordial pain 12/20/2014  . Diarrhea   . Chest pain 11/28/2014  . Pressure ulcer, heel 11/28/2014  . AKI (acute kidney injury) ([place]) 11/28/2014  . Chronic diastolic congestive heart failure ([place]) 11/28/2014  . Parkinson's disease ([place]) 11/28/2014  . Pain in the chest   . Encounter for  therapeutic drug monitoring 09/15/2013  . Long term (current) use of anticoagulants 09/22/2010  . Pulmonary embolism ([place]) 08/25/2010  . Generalized weakness 08/16/2009  . LEG EDEMA 08/16/2009  . Chronic diastolic CHF (congestive heart failure) ([place]) 08/16/2009  . Venous (peripheral) insufficiency 11/15/2008  . PULMONARY EMBOLISM, HX OF 11/15/2008    History reviewed. No pertinent surgical history.   OB History   No obstetric history on file.     Family History  Problem Relation Age of Onset  . Heart disease Mother        60s    Social History   Tobacco Use  . Smoking status: Never Smoker  . Smokeless tobacco: Never Used  Substance Use Topics  . Alcohol use: No  . Drug use: No    Home Medications Prior to Admission medications   Medication Sig Start Date End Date Taking? Authorizing Provider  acetaminophen (TYLENOL) 325 MG tablet Take 2 tablets (650 mg total) by mouth every 6 (six) hours as needed for mild pain, fever or headache (or Fever >/= 101). 07/17/19  Yes Emokpae, Courage, MD  feeding supplement, ENSURE ENLIVE, (ENSURE ENLIVE) LIQD Take 237 mLs by mouth 2 (two) times daily between meals. 07/17/19  Yes Emokpae, Courage, MD  LORazepam (ATIVAN) 2 MG/ML concentrated solution Take 0.25 mg by mouth once.   Yes [provider]  rivaroxaban (XARELTO) 20 MG TABS tablet Take 1 tablet (20 mg total) by mouth daily with supper. 07/17/19  Yes Roxan Hockey, MD  Multiple Vitamin (MULTIVITAMIN WITH  MINERALS) TABS tablet Take 1 tablet by mouth daily. Patient not taking: Reported on 07/17/2019 07/18/19   Shon Hale, MD    Allergies    Contrast media [iodinated diagnostic agents], Vancomycin, and Vioxx [rofecoxib]  Review of Systems   Review of Systems  Constitutional: Negative for chills and fever.  HENT: Negative for congestion, rhinorrhea and sore throat.   Eyes: Negative for visual disturbance.  Respiratory: Negative for cough and shortness of breath.     Cardiovascular: Negative for chest pain and leg swelling.  Gastrointestinal: Negative for abdominal pain, diarrhea, nausea and vomiting.  Genitourinary: Negative for dysuria.  Musculoskeletal: Negative for back pain and neck pain.  Skin: Negative for rash.  Neurological: Negative for dizziness, light-headedness and headaches.  Hematological: Does not bruise/bleed easily.  Psychiatric/Behavioral: Negative for behavioral problems and confusion.    Physical Exam Updated Vital Signs BP 111/70 (BP Location: Left Arm)   Pulse (!) 110   Temp 97.8 F (36.6 C) (Oral)   Resp 19   SpO2 99%   Physical Exam Vitals and nursing note reviewed.  Constitutional:      General: She is not in acute distress.    Appearance: Normal appearance. She is well-developed.  HENT:     Head: Normocephalic and atraumatic.  Eyes:     Extraocular Movements: Extraocular movements intact.     Conjunctiva/sclera: Conjunctivae normal.     Pupils: Pupils are equal, round, and reactive to light.  Cardiovascular:     Rate and Rhythm: Normal rate and regular rhythm.     Heart sounds: No murmur.  Pulmonary:     Effort: Pulmonary effort is normal. No respiratory distress.     Breath sounds: Normal breath sounds.  Abdominal:     Palpations: Abdomen is soft.     Tenderness: There is no abdominal tenderness.  Musculoskeletal:        General: No swelling.     Cervical back: Neck supple.  Skin:    General: Skin is warm and dry.     Capillary Refill: Capillary refill takes less than 2 seconds.  Neurological:     Mental Status: She is alert.     Comments: Patient alert and talking.  Some generalized weakness but no significant focal deficit.     ED Results / Procedures / Treatments   Labs (all labs ordered are listed, but only abnormal results are displayed) Labs Reviewed - No data to display  EKG None  Radiology No results found.  Procedures Procedures (including critical care time)  Medications  Ordered in ED Medications - No data to display  ED Course  I have reviewed the triage vital signs and the nursing notes.  Pertinent labs & imaging results that were available during my care of the patient were reviewed by me and considered in my medical decision making (see chart for details).    MDM Rules/Calculators/A&P                      Sounds as if patient became very angry with staff at [facility].  She talked about that she needed to go to the bathroom and they went letter.  Patient's been very cooperative here.  Patient stable for discharge back to facility.  Vital signs without significant abnormalities.  Patient just discharged from [facility] today to the nursing facility.  Patient stable to be discharged back to [facility].   Final Clinical Impression(s) / ED Diagnoses Final diagnoses:  Aggressive behavior  Rx / DC Orders ED Discharge Orders    None       Vanetta Mulders, MD 07/17/19 2234

## 2019-07-17 NOTE — Discharge Summary (Signed)
[name], is a 85 y.o. female  DOB 06/29/1934  MRN [id number].  Admission date:  07/13/2019  Admitting Physician  Erick Blinks, MD  Discharge Date:  07/17/2019   Primary MD  [name], MD  Recommendations for primary care physician for things to follow:   1) patient is on Xarelto So avoid ibuprofen/Advil/Aleve/Motrin/Goody Powders/Naproxen/BC powders/Meloxicam/Diclofenac/Indomethacin and other Nonsteroidal anti-inflammatory medications as these will make you more likely to bleed and can cause stomach ulcers, can also cause Kidney problems.   2) continue Xarelto for now however consider discontinuation of Xarelto if recurrent falls  3) diagnosed with COVID-19 on 06/17/2019--- patient is outside the window for airborne isolation,  Admission Diagnosis  Weakness [R53.1] Pain [R52] Acute cystitis with hematuria [N30.01] Fall, initial encounter [W19.XXXA] Ambulatory dysfunction [R26.2] Acute pain of right knee [M25.561] Generalized weakness [R53.1]  Discharge Diagnosis  Weakness [R53.1] Pain [R52] Acute cystitis with hematuria [N30.01] Fall, initial encounter [W19.XXXA] Ambulatory dysfunction [R26.2] Acute pain of right knee [M25.561] Generalized weakness [R53.1]    Active Problems:   Generalized weakness   Chronic diastolic CHF (congestive heart failure) (HCC)   PULMONARY EMBOLISM, HX OF   Parkinson's disease (HCC)   Physical debility   DNR (do not resuscitate) discussion   Ambulatory dysfunction      Past Medical History:  Diagnosis Date  . CHF (congestive heart failure) (HCC)   . History of pulmonary embolism   . Parkinson's disease (HCC)   . Venous stasis    Hx Reviewed   HPI  from the history and physical done on the day of admission:    [name]  is a 85 y.o. female, a history of CHF, PE on Xarelto, Parkinson's disease, who tested positive for COVID-19 when seen here  on December 26 presenting for evaluation of injury sustained in a fall.  Patient with baseline poor mobility, given her Parkinson disease, and osteoarthritis, patient fell asleep in her recliner chair yesterday, and this morning she woke up to find that she was sliding out of the chair and landed on her buttocks, reports she did hurt her right lower extremity during the fall, and reports she felt just too weak to get off the floor, no head injury, no headache, no focal deficits, lives at home by herself, she has visiting aide on a daily basis, total of 8 hours. - IN ED work-up was significant for positive UA, she does report mild dysuria, started on Rocephin, she had mildly elevated total CK, but renal function at baseline, patient was seen by PT, recommendation was for SNF, but she declined month I was consulted to admit overnight for safe disposition is achieved.    Hospital Course:    Brief Summary:- 85 y.o.female,a history of CHF, PE on Xarelto, Parkinson's disease, who tested positive for COVID-19 when seen here on December 26 presenting for evaluation of injury sustained in a fall admitted on 07/12/2018 with UTI and unwitnessed fall at home  A/p 1)Generalized Weakness/Deconditioning/Falls--- PT eval appreciated, SNF recommended, patient agreeable. Okay  to discharge to [facility] rehab.   -- PTA patient lived alone with private caregivers several hours a day but not around-the-clock (home care 8 hours/day, 8 AM -12 PM and 4 PM to 8 PM)  2)Social/Ethics--- DNR/DNI, plan of care discussed with patient's younger sister Ms. Quintella [phone]  3)History of PE--- continue Xarelto however if falls become more recurrent may have to consider discontinuation of Xarelto  4)HFpEF--appears compensated/euvolemic, -Oral intake is not great, risk for dehydration so avoid diuretics for now  6)Parkinsonian tremors--- does not appear to be on any medications PTA for  Parkinson's  7)Recent COVID-19 infection--- diagnosed with COVID-19 on 06/17/2019--- patient is outside the window for airborne isolation --- asymptomatic at this time   Disposition--- transfer to SNF rehab  Code Status : DNR  Family Communication:  (patient is alert, awake and coherent) plan of care discussed with patient's younger sister Ms. Quintella Reichert- [phone]  Disposition Plan  : SNF Rehab at [facility]  Consults  :  na  DVT Prophylaxis  : Xarelto-   Discharge Condition: stable  Follow UP  Contact information for after-discharge care    Destination    [facility] Preferred SNF .   Service: Skilled Nursing Contact information: [address] [place] [address] [phone]              Diet and Activity recommendation:  As advised  Discharge Instructions    Discharge Instructions    Call MD for:  difficulty breathing, headache or visual disturbances   Complete by: As directed    Call MD for:  persistant dizziness or light-headedness   Complete by: As directed    Call MD for:  persistant nausea and vomiting   Complete by: As directed    Call MD for:  severe uncontrolled pain   Complete by: As directed    Call MD for:  temperature >100.4   Complete by: As directed    Diet general   Complete by: As directed    Discharge instructions   Complete by: As directed    1) patient is on Xarelto So avoid ibuprofen/Advil/Aleve/Motrin/Goody Powders/Naproxen/BC powders/Meloxicam/Diclofenac/Indomethacin and other Nonsteroidal anti-inflammatory medications as these will make you more likely to bleed and can cause stomach ulcers, can also cause Kidney problems.   2) continue Xarelto for now however consider discontinuation of Xarelto if recurrent falls  3) diagnosed with COVID-19 on 06/17/2019--- patient is outside the window for airborne isolation,   Increase activity slowly   Complete by: As directed          Discharge Medications     Allergies as of 07/17/2019      Reactions   Contrast Media [iodinated Diagnostic Agents] Anaphylaxis, Shortness Of Breath   Vancomycin Shortness Of Breath   Vioxx [rofecoxib] Other (See Comments)   REACTION: irregular heartbeat      Medication List    STOP taking these medications   clindamycin 300 MG capsule Commonly known as: CLEOCIN   furosemide 20 MG tablet Commonly known as: Lasix   potassium chloride 10 MEQ tablet Commonly known as: KLOR-CON   Rivaroxaban 15 & 20 MG Tbpk Replaced by: rivaroxaban 20 MG Tabs tablet   warfarin 2.5 MG tablet Commonly known as: COUMADIN     TAKE these medications   acetaminophen 325 MG tablet Commonly known as: TYLENOL Take 2 tablets (650 mg total) by mouth every 6 (six) hours as needed for mild pain, fever or headache (or Fever >/= 101).  What changed:   medication strength  how much to take  when to take this  reasons to take this   feeding supplement (ENSURE ENLIVE) Liqd Take 237 mLs by mouth 2 (two) times daily between meals.   multivitamin with minerals Tabs tablet Take 1 tablet by mouth daily. Start taking on: July 18, 2019   rivaroxaban 20 MG Tabs tablet Commonly known as: XARELTO Take 1 tablet (20 mg total) by mouth daily with supper. Replaces: Rivaroxaban 15 & 20 MG Tbpk       Major procedures and Radiology Reports - PLEASE review detailed and final reports for all details, in brief -   DG Knee 1-2 Views Right  Result Date: 07/13/2019 CLINICAL DATA:  Right knee pain. The patient was found down this morning. EXAM: RIGHT KNEE - 1-2 VIEW COMPARISON:  Radiographs dated 02/12/2005 FINDINGS: There is no fracture or dislocation or joint effusion. There is severe osteoarthritis of the medial and patellofemoral compartments with significant progression since 2006. IMPRESSION: No acute abnormality. 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 Electronically Signed   By:  Francene Boyers M.D.   On: 07/13/2019 10:13   DG Ankle Complete Right  Result Date: 07/13/2019 CLINICAL DATA:  Right ankle pain after the patient was found down this morning. EXAM: RIGHT ANKLE - COMPLETE 3+ VIEW COMPARISON:  Radiographs dated 12/21/2004 FINDINGS: There is no evidence of fracture, dislocation, or joint effusion. There is no evidence of significant arthropathy or other focal bone abnormality. Soft tissue prominence around the ankle and in the lower leg. IMPRESSION: No acute bone abnormality. Electronically Signed   By: Francene Boyers M.D.   On: 07/13/2019 10:11   CT Knee Right Wo Contrast  Result Date: 07/13/2019 CLINICAL DATA:  Right knee pain, inability to bear weight. Evaluate for fracture EXAM: CT OF THE RIGHT KNEE WITHOUT CONTRAST TECHNIQUE: Multidetector CT imaging of the right knee was performed according to the standard protocol. Multiplanar CT image reconstructions were also generated. COMPARISON:  Same day radiographs FINDINGS: Bones/Joint/Cartilage Osseous structures are diffusely demineralized. No acute fracture. No malalignment. Severe tricompartmental osteoarthritis most pronounced within the medial compartment with complete joint space loss, bony remodeling, and large marginal osteophyte formation. There are multiple ossified loose bodies at the posterior joint line. There is a small knee joint effusion without fat-fluid or hematocrit level. Ligaments Suboptimally assessed by CT. Muscles and Tendons No gross musculotendinous abnormality by CT. Soft tissues No soft tissue fluid collection or hematoma. Extensive vascular calcifications. IMPRESSION: 1. No acute fracture or malalignment. 2. Severe tricompartmental osteoarthritis most pronounced within the medial compartment. 3. Small knee joint effusion with multiple intra-articular loose bodies. Electronically Signed   By: Duanne Guess D.O.   On: 07/13/2019 16:58   DG HIP UNILAT W OR W/O PELVIS 2-3 VIEWS RIGHT  Result Date:  07/13/2019 CLINICAL DATA:  Right hip pain. The patient was found down this morning. EXAM: DG HIP (WITH OR WITHOUT PELVIS) 2-3V RIGHT COMPARISON:  02/12/2005 FINDINGS: There is no fracture or dislocation. Osteopenia. Minimal degenerative changes of the right hip. IMPRESSION: No acute abnormality. Osteopenia. Electronically Signed   By: Francene Boyers M.D.   On: 07/13/2019 10:15   Micro Results   Recent Results (from the past 240 hour(s))  Urine culture     Status: None   Collection Time: 07/13/19 11:52 AM   Specimen: Urine, Random  Result Value Ref Range Status   Specimen Description   Final    URINE, RANDOM Performed at [facility]  [facility], [address], [place], [place] [address]    Special Requests   Final    NONE Performed at [facility], [address] [place], [place] [address]    Culture   Final    NO GROWTH Performed at [facility], [place] [address] [place], [place] [address]    Report Status 07/15/2019 FINAL  Final   Today   Subjective    Kambryn Wyss today has no new complaints - No fever  Or chills   No Nausea, Vomiting or Diarrhea      Patient has been seen and examined prior to discharge   Objective   Blood pressure (!) 141/90, pulse 92, temperature 98.2 F (36.8 C), temperature source Oral, resp. rate 17, height 5\' 2"  (1.575 m), weight 54.4 kg, SpO2 97 %.   Intake/Output Summary (Last 24 hours) at 07/17/2019 1256 Last data filed at 07/17/2019 0900 Gross per 24 hour  Intake 843.02 ml  Output 900 ml  Net -56.98 ml   Exam Gen:- Awake Alert, in no acute distress HEENT:- [place].AT, No sclera icterus Neck-Supple Neck,No JVD,.  Lungs-no wheezing, fair symmetrical air movement CV- S1, S2 normal, regular  Abd-  +ve B.Sounds, Abd Soft, No tenderness,    Extremity/Skin:- No  edema, pedal pulses present  Psych-affect is appropriate, oriented x3 Neuro-generalized weakness, obvious  Deformity  / contractures  --no new focal  deficits, parkinsonian tremors noted    Data Review   CBC w Diff:  Lab Results  Component Value Date   WBC 8.9 07/13/2019   HGB 13.4 07/13/2019   HCT 42.7 07/13/2019   PLT 203 07/13/2019   LYMPHOPCT 19 06/17/2019   MONOPCT 8 06/17/2019   EOSPCT 2 06/17/2019   BASOPCT 1 06/17/2019    CMP:  Lab Results  Component Value Date   NA 141 07/17/2019   K 3.8 07/17/2019   CL 109 07/17/2019   CO2 26 07/17/2019   BUN 12 07/17/2019   CREATININE 0.47 07/17/2019   PROT 7.2 01/25/2019   ALBUMIN 4.1 01/25/2019   BILITOT 0.9 01/25/2019   ALKPHOS 69 01/25/2019   AST 21 01/25/2019   ALT 16 01/25/2019  . Total Discharge time is about 33 minutes  Roxan Hockey M.D on 07/17/2019 at 12:56 PM  Go to [email or web address] -  for contact info  [facility] - Office  [phone]

## 2019-07-17 NOTE — TOC Transition Note (Signed)
Transition of Care [facility]) - CM/SW Discharge Note   Patient Details  Name: [name] MRN: [id number] Date of Birth: [birth year]  Transition of [facility]) CM/SW Contact:  Shade Flood, LCSW Phone Number: 07/17/2019, 12:01 PM   Clinical Narrative:     TOC following. [facility] has offered a SNF rehab bed to pt. Updated pt by phone and she is agreeable to go to [facility] for short term rehab. Updated MD who states pt will dc today.   DC clinical will be sent electronically. RN can call report and coordinate pt's transfer to [facility] with their RN.   There are no other TOC needs for dc.  Expected Discharge Plan: Skilled Nursing Facility Barriers to Discharge: Barriers Resolved   Patient Goals and CMS Choice     Choice offered to / list presented to : Patient  Expected Discharge Plan and Services Expected Discharge Plan: [place] In-house Referral: Clinical Social Work   Post Acute Care Choice: [place] Living arrangements for the past 2 months: [place]                                      Prior Living Arrangements/Services Living arrangements for the past 2 months: Single Family Home Lives with:: Self Patient language and need for interpreter reviewed:: Yes        Need for Family Participation in Patient Care: Yes (Comment) Care giver support system in place?: Yes (comment) Current home services: DME Criminal Activity/Legal Involvement Pertinent to Current Situation/Hospitalization: No - Comment as needed  Activities of Daily Living Home Assistive Devices/Equipment: Gilford Rile (specify type) ADL Screening (condition at time of admission) Patient's cognitive ability adequate to safely complete daily activities?: Yes Is the patient deaf or have difficulty hearing?: No Does the patient have difficulty seeing, even when wearing glasses/contacts?: No Does the patient have difficulty concentrating, remembering, or making decisions?:  No Patient able to express need for assistance with ADLs?: Yes Does the patient have difficulty dressing or bathing?: Yes Independently performs ADLs?: No Communication: Independent Dressing (OT): Dependent Is this a change from baseline?: Pre-admission baseline Grooming: Dependent Is this a change from baseline?: Pre-admission baseline Feeding: Needs assistance Is this a change from baseline?: Pre-admission baseline Bathing: Dependent Is this a change from baseline?: Pre-admission baseline Toileting: Dependent Is this a change from baseline?: Pre-admission baseline In/Out Bed: Dependent Is this a change from baseline?: Pre-admission baseline Walks in Home: Needs assistance Is this a change from baseline?: Pre-admission baseline Does the patient have difficulty walking or climbing stairs?: Yes Weakness of Legs: Both Weakness of Arms/Hands: Both  Permission Sought/Granted Permission sought to share information with : Chartered certified accountant granted to share information with : Yes, Verbal Permission Granted     Permission granted to share info w AGENCY: [facility]        Emotional Assessment   Attitude/Demeanor/Rapport: Engaged Affect (typically observed): Pleasant Orientation: : Oriented to Self, Oriented to Place, Oriented to  Time, Oriented to Situation Alcohol / Substance Use: Not Applicable Psych Involvement: No (comment)  Admission diagnosis:  Weakness [R53.1] Pain [R52] Acute cystitis with hematuria [N30.01] Fall, initial encounter [W19.XXXA] Ambulatory dysfunction [R26.2] Acute pain of right knee [M25.561] Generalized weakness [R53.1] Patient Active Problem List   Diagnosis Date Noted  . Ambulatory dysfunction 07/13/2019  . Goals of care, counseling/discussion   . Palliative care by specialist   . DNR (  do not resuscitate) discussion   . Coagulopathy (HCC) 03/19/2018  . Elevated INR   . Supratherapeutic INR 02/24/2018  . Physical  debility 02/24/2018  . Folliculitis of perineum 12/15/2017  . Streptococcal bacteremia 07/09/2017  . Cellulitis of lower extremity   . Bacteremia due to Gram-positive bacteria 07/06/2017  . Cellulitis and abscess of left leg 07/05/2017  . Vulvar abscess 07/05/2017  . Infected cyst of Bartholin's gland duct 07/05/2017  . Weakness 12/21/2014  . Leg weakness 12/20/2014  . Lower extremity weakness 12/20/2014  . Precordial pain 12/20/2014  . Diarrhea   . Chest pain 11/28/2014  . Pressure ulcer, heel 11/28/2014  . AKI (acute kidney injury) (HCC) 11/28/2014  . Chronic diastolic congestive heart failure (HCC) 11/28/2014  . Parkinson's disease (HCC) 11/28/2014  . Pain in the chest   . Encounter for therapeutic drug monitoring 09/15/2013  . Long term (current) use of anticoagulants 09/22/2010  . Pulmonary embolism (HCC) 08/25/2010  . Generalized weakness 08/16/2009  . LEG EDEMA 08/16/2009  . Chronic diastolic CHF (congestive heart failure) (HCC) 08/16/2009  . Venous (peripheral) insufficiency 11/15/2008  . PULMONARY EMBOLISM, HX OF 11/15/2008   PCP:  [name], MD Pharmacy:   [facility] [address] [place] [address] [place] [address] [place] [address] [place] [address] Phone: [phone] Fax: [phone]     Social Determinants of Health (SDOH) Interventions    Readmission Risk Interventions Readmission Risk Prevention Plan 07/17/2019  Medication Screening Complete  Transportation Screening Complete  Some recent data might be hidden    Final next level of care: Skilled Nursing Facility Barriers to Discharge: Barriers Resolved   Patient Goals and CMS Choice     Choice offered to / list presented to : Patient  Discharge Placement                       Discharge Plan and Services In-house Referral: Clinical Social Work   Post Acute Care Choice: Skilled Nursing Facility                               Social Determinants of Health (SDOH)  Interventions     Readmission Risk Interventions Readmission Risk Prevention Plan 07/17/2019  Medication Screening Complete  Transportation Screening Complete  Some recent data might be hidden

## 2019-07-17 NOTE — Discharge Instructions (Addendum)
Patient calm and very cooperative here.  Patient observed for period of time vital signs without significant abnormalities.  Patient stable for discharge back to [facility].

## 2019-07-17 NOTE — Plan of Care (Signed)

## 2019-07-17 NOTE — Progress Notes (Signed)
OT Cancellation Note  Patient Details Name: ATLANTA PELTO MRN: [id number] DOB: [birth year]   Cancelled Treatment:    Reason Eval/Treat Not Completed: Other (comment). Patient being fed breakfast by nursing staff this AM. Two attempts made to complete OT evaluation.  Patient familiar with therapy staff from previous admissions. Patient require 24/7 supervision and care at baseline. She has been living alone and receiving addistance with ADL tasks from a personal care attendant 8 hours a day (4 in the AM, 4 in the PM), 7 days a week. Pt requires 24/7 assist and has previously been against discharging to a SNF. Pt is now in agreement with SNF if she can go to [facility]. Recommend that patient receive OT services at SNF with the recommendation that patient may need to receive 24/7 assist/supervision in the future. Will sign off and allow patient to evaluated by staff OT at SNF.      Limmie Patricia, OTR/L,CBIS  [phone]  07/17/2019, 9:32 AM
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
## 2019-07-17 NOTE — Progress Notes (Signed)
Nsg Discharge Note  Admit Date:  07/13/2019 Discharge date: 07/17/2019   [name] to be D/C'd tp [facility] per MD order.  AVS completed.  Copy for chart, and copy for patient signed, and dated. Patient/caregiver able to verbalize understanding.  Discharge Medication: Allergies as of 07/17/2019      Reactions   Contrast Media [iodinated Diagnostic Agents] Anaphylaxis, Shortness Of Breath   Vancomycin Shortness Of Breath   Vioxx [rofecoxib] Other (See Comments)   REACTION: irregular heartbeat      Medication List    STOP taking these medications   clindamycin 300 MG capsule Commonly known as: CLEOCIN   furosemide 20 MG tablet Commonly known as: Lasix   potassium chloride 10 MEQ tablet Commonly known as: KLOR-CON   Rivaroxaban 15 & 20 MG Tbpk Replaced by: rivaroxaban 20 MG Tabs tablet   warfarin 2.5 MG tablet Commonly known as: COUMADIN     TAKE these medications   acetaminophen 325 MG tablet Commonly known as: TYLENOL Take 2 tablets (650 mg total) by mouth every 6 (six) hours as needed for mild pain, fever or headache (or Fever >/= 101). What changed:   medication strength  how much to take  when to take this  reasons to take this   feeding supplement (ENSURE ENLIVE) Liqd Take 237 mLs by mouth 2 (two) times daily between meals.   multivitamin with minerals Tabs tablet Take 1 tablet by mouth daily. Start taking on: July 18, 2019   rivaroxaban 20 MG Tabs tablet Commonly known as: XARELTO Take 1 tablet (20 mg total) by mouth daily with supper. Replaces: Rivaroxaban 15 & 20 MG Tbpk       Discharge Assessment: Vitals:   07/17/19 0616 07/17/19 1132  BP: (!) 141/90   Pulse: 92   Resp: 17   Temp: 98.2 F (36.8 C)   SpO2: 96% 97%   Skin clean, dry and intact without evidence of skin break down, no evidence of skin tears noted. IV catheter discontinued intact. Site without signs and symptoms of complications - no redness or edema noted at  insertion site, patient denies c/o pain - only slight tenderness at site.  Dressing with slight pressure applied.  D/c Instructions-Education: Discharge instructions given to patient/family with verbalized understanding. D/c education completed with patient/family including follow up instructions, medication list, d/c activities limitations if indicated, with other d/c instructions as indicated by MD - patient able to verbalize understanding, all questions fully answered. Patient instructed to return to ED, call 911, or call MD for any changes in condition.  Patient escorted via [facility]. Carole Civil, RN 07/17/2019 1:41 PM

## 2019-07-18 ENCOUNTER — Inpatient Hospital Stay
Admission: RE | Admit: 2019-07-18 | Discharge: 2019-09-24 | Disposition: A | Discharge status: Skilled Nursing Facility | Payer: Medicare Other | Source: Ambulatory Visit | Attending: Internal Medicine | Admitting: Internal Medicine

## 2019-07-18 NOTE — ED Notes (Signed)
[facility] here to get pt. Pt assisted to the stretcher. Dr Hyacinth Meeker notified.

## 2019-07-18 NOTE — ED Notes (Signed)
Per Suezanne Cheshire RN, [facility] has agreed to take pt back. Report given to [facility] by [facility] RN. [facility] to come get patient after lunch.

## 2019-07-18 NOTE — ED Notes (Signed)
Pt. Refused to eat, Offered her different things on the tray she refused.

## 2019-07-18 NOTE — ED Notes (Signed)
This RN spoke with pt's sister, Kathie Rhodes, and informed her of pt's situation.  Sister was informed that the [facility] refused to take pt back after discharge.  Told sister pt would stay in the ED overnight until the situation could be corrected during the next shift; sister verbalized understanding and left her phone number for contact information  Asencion Islam, sister [phone]

## 2019-07-19 ENCOUNTER — Non-Acute Institutional Stay (SKILLED_NURSING_FACILITY): Payer: Medicare Other | Admitting: Internal Medicine

## 2019-07-19 ENCOUNTER — Other Ambulatory Visit (HOSPITAL_COMMUNITY)
Admission: RE | Admit: 2019-07-19 | Discharge: 2019-07-19 | Disposition: A | Discharge status: Home / Self Care | Payer: No Typology Code available for payment source | Source: Skilled Nursing Facility | Attending: Adult Health | Admitting: Adult Health

## 2019-07-19 ENCOUNTER — Encounter: Payer: Self-pay | Admitting: Internal Medicine

## 2019-07-19 DIAGNOSIS — R531 Weakness: Secondary | ICD-10-CM | POA: Diagnosis not present

## 2019-07-19 DIAGNOSIS — G20 Parkinson's disease: Secondary | ICD-10-CM

## 2019-07-19 DIAGNOSIS — I10 Essential (primary) hypertension: Secondary | ICD-10-CM | POA: Insufficient documentation

## 2019-07-19 DIAGNOSIS — I5032 Chronic diastolic (congestive) heart failure: Secondary | ICD-10-CM

## 2019-07-19 DIAGNOSIS — U071 COVID-19: Secondary | ICD-10-CM

## 2019-07-19 DIAGNOSIS — Z86718 Personal history of other venous thrombosis and embolism: Secondary | ICD-10-CM

## 2019-07-19 DIAGNOSIS — G20A1 Parkinson's disease without dyskinesia, without mention of fluctuations: Secondary | ICD-10-CM

## 2019-07-19 LAB — BASIC METABOLIC PANEL
Anion gap: 8 (ref 5–15)
BUN: 19 mg/dL (ref 8–23)
CO2: 23 mmol/L (ref 22–32)
Calcium: 8.6 mg/dL — ABNORMAL LOW (ref 8.9–10.3)
Chloride: 106 mmol/L (ref 98–111)
Creatinine, Ser: 0.58 mg/dL (ref 0.44–1.00)
GFR calc Af Amer: >60 mL/min (ref >60)
GFR calc non Af Amer: >60 mL/min (ref >60)
Glucose, Bld: 79 mg/dL (ref 70–99)
Potassium: 4.4 mmol/L (ref 3.5–5.1)
Sodium: 137 mmol/L (ref 135–145)

## 2019-07-19 LAB — CBC
HCT: 41 % (ref 36.0–46.0)
Hemoglobin: 12.6 g/dL (ref 12.0–15.0)
MCH: 27.9 pg (ref 26.0–34.0)
MCHC: 30.7 g/dL (ref 30.0–36.0)
MCV: 90.7 fL (ref 80.0–100.0)
Platelets: 216 10*3/uL (ref 150–400)
RBC: 4.52 MIL/uL (ref 3.87–5.11)
RDW: 14.5 % (ref 11.5–15.5)
WBC: 6 10*3/uL (ref 4.0–10.5)
nRBC: 0 % (ref 0.0–0.2)

## 2019-07-19 NOTE — Progress Notes (Signed)
: Provider:  Randon Goldsmith. Lyn Hollingshead, MD Location:  [facility] Room Number: 144P Place of Service:  SNF ([phone])  PCP: [name], MD Patient Care Team: [name], MD as PCP - General (Internal Medicine) Anselm Lis, NP as Nurse Practitioner (Hospice and Palliative Medicine)  Extended Emergency Contact Information Primary Emergency Contact: Gifford Shave [place] Home Phone: [phone] Relation: Sister Secondary Emergency Contact: Asencion Islam Home Phone: [phone] Relation: Sister     Allergies: Contrast media [iodinated diagnostic agents], Vancomycin, and Vioxx [rofecoxib]  Chief Complaint  Patient presents with  . New Admit To SNF    Admit to Facility     HPI: Patient is 85 y.o. female with congestive heart failure, PE on Xarelto, Parkinson's disease, who tested positive for COVID-19 when seen here on December 26 presenting with evaluation of injury sustained in a fall.  Patient's baseline for mobility given [name] and osteoarthritis, fell asleep in her recliner chair yesterday and this morning woke up to find that she was sliding out of the chair and landed on her buttocks.  She reported that she did hurt her right lower extremity during the fall and she was just too weak to get up off the floor.  In the ED work-up was significant for positive UA and she does report mild dysuria and elevated CPK.  Patient in hospital from 1/21-25 where she was treated for UTI with Rocephin and treated for mildly elevated CK with IV fluids.  Patient is admitted to skilled nursing facility for OT/PT.  At skilled nursing facility patient will be followed for atrial fibrillation treated with Xarelto.  Past Medical History:  Diagnosis Date  . CHF (congestive heart failure) (HCC)   . History of pulmonary embolism   . Parkinson's disease (HCC)   . Venous stasis     History reviewed. No pertinent surgical history.  Allergies as  of 07/19/2019      Reactions   Contrast Media [iodinated Diagnostic Agents] Anaphylaxis, Shortness Of Breath   Vancomycin Shortness Of Breath   Vioxx [rofecoxib] Other (See Comments)   REACTION: irregular heartbeat      Medication List    Notice   This visit is during an admission. Changes to the med list made in this visit will be reflected in the After Visit Summary of the admission.    Current Outpatient Medications on File Prior to Visit  Medication Sig Dispense Refill  . acetaminophen (TYLENOL) 325 MG tablet Take 2 tablets (650 mg total) by mouth every 6 (six) hours as needed for mild pain, fever or headache (or Fever >/= 101). 12 tablet 0  . feeding supplement, ENSURE ENLIVE, (ENSURE ENLIVE) LIQD Take 237 mLs by mouth 2 (two) times daily between meals. 2370 mL 2  . LORazepam (ATIVAN) 2 MG/ML concentrated solution Take 0.25 mg by mouth once.    . NON FORMULARY Diet NAS, thin liquids    . rivaroxaban (XARELTO) 20 MG TABS tablet Take 1 tablet (20 mg total) by mouth daily with supper. 30 tablet 1   No current facility-administered medications on file prior to visit.     No orders of the defined types were placed in this encounter.   Immunization History  Administered Date(s) Administered  . Pneumococcal Polysaccharide-23 06/23/2007    Social History   Tobacco Use  . Smoking status: Never Smoker  . Smokeless tobacco: Never Used  Substance Use Topics  . Alcohol use: No  Family history is   Family History  Problem Relation Age of Onset  . Heart disease Mother        90s      Review of Systems  GENERAL:  no fevers, fatigue, appetite changes SKIN: No itching, or rash EYES: No eye pain, redness, discharge EARS: No earache, tinnitus, change in hearing NOSE: No congestion, drainage or bleeding  MOUTH/THROAT: No mouth or tooth pain, No sore throat RESPIRATORY: No cough, wheezing, SOB CARDIAC: No chest pain, palpitations, lower extremity edema  GI: No abdominal  pain, No N/V/D or constipation, No heartburn or reflux  GU: No dysuria, frequency or urgency, or incontinence  MUSCULOSKELETAL: No unrelieved bone/joint pain NEUROLOGIC: No headache, dizziness or focal weakness PSYCHIATRIC: No c/o anxiety or sadness   Vitals:   07/19/19 1138  BP: 125/88  Pulse: 74  Resp: 20  Temp: 98.1 F (36.7 C)    SpO2 Readings from Last 1 Encounters:  07/18/19 98%   Body mass index is 27.8 kg/m.     Physical Exam  GENERAL APPEARANCE: Alert, conversant,  No acute distress.  SKIN: No diaphoresis rash HEAD: Normocephalic, atraumatic  EYES: Conjunctiva/lids clear. Pupils round, reactive. EOMs intact.  EARS: External exam WNL, canals clear. Hearing grossly normal.  NOSE: No deformity or discharge.  MOUTH/THROAT: Lips w/o lesions  RESPIRATORY: Breathing is even, unlabored. Lung sounds are clear   CARDIOVASCULAR: Heart RRR no murmurs, rubs or gallops. No peripheral edema.   GASTROINTESTINAL: Abdomen is soft, non-tender, not distended w/ normal bowel sounds. GENITOURINARY: Bladder non tender, not distended  MUSCULOSKELETAL: Dropped head syndrome NEUROLOGIC:  Cranial nerves 2-12 grossly intact. Moves all extremities with some ataxia right upper extremities PSYCHIATRIC: Mood and affect appropriate to situation, no behavioral issues  Patient Active Problem List   Diagnosis Date Noted  . Ambulatory dysfunction 07/13/2019  . Goals of care, counseling/discussion   . Palliative care by specialist   . DNR (do not resuscitate) discussion   . Coagulopathy (HCC) 03/19/2018  . Elevated INR   . Supratherapeutic INR 02/24/2018  . Physical debility 02/24/2018  . Folliculitis of perineum 12/15/2017  . Streptococcal bacteremia 07/09/2017  . Cellulitis of lower extremity   . Bacteremia due to Gram-positive bacteria 07/06/2017  . Cellulitis and abscess of left leg 07/05/2017  . Vulvar abscess 07/05/2017  . Infected cyst of Bartholin's gland duct 07/05/2017  .  Weakness 12/21/2014  . Leg weakness 12/20/2014  . Lower extremity weakness 12/20/2014  . Precordial pain 12/20/2014  . Diarrhea   . Chest pain 11/28/2014  . Pressure ulcer, heel 11/28/2014  . AKI (acute kidney injury) (HCC) 11/28/2014  . Chronic diastolic congestive heart failure (HCC) 11/28/2014  . Parkinson's disease (HCC) 11/28/2014  . Pain in the chest   . Encounter for therapeutic drug monitoring 09/15/2013  . Long term (current) use of anticoagulants 09/22/2010  . Pulmonary embolism (HCC) 08/25/2010  . Generalized weakness 08/16/2009  . LEG EDEMA 08/16/2009  . Chronic diastolic CHF (congestive heart failure) (HCC) 08/16/2009  . Venous (peripheral) insufficiency 11/15/2008  . PULMONARY EMBOLISM, HX OF 11/15/2008      Labs reviewed: Basic Metabolic Panel:    Component Value Date/Time   NA 137 07/19/2019 0648   K 4.4 07/19/2019 0648   CL 106 07/19/2019 0648   CO2 23 07/19/2019 0648   GLUCOSE 79 07/19/2019 0648   BUN 19 07/19/2019 0648   CREATININE 0.58 07/19/2019 0648   CALCIUM 8.6 (L) 07/19/2019 0648   PROT 7.2 01/25/2019 1100  ALBUMIN 4.1 01/25/2019 1100   AST 21 01/25/2019 1100   ALT 16 01/25/2019 1100   ALKPHOS 69 01/25/2019 1100   BILITOT 0.9 01/25/2019 1100   GFRNONAA >60 07/19/2019 0648   GFRAA >60 07/19/2019 0648    Recent Labs    07/13/19 1011 07/17/19 0603 07/19/19 0648  NA 140 141 137  K 3.7 3.8 4.4  CL 108 109 106  CO2 23 26 23   GLUCOSE 115* 92 79  BUN 14 12 19   CREATININE 0.49 0.47 0.58  CALCIUM 8.5* 8.4* 8.6*   Liver Function Tests: Recent Labs    01/25/19 1100  AST 21  ALT 16  ALKPHOS 69  BILITOT 0.9  PROT 7.2  ALBUMIN 4.1   No results for input(s): LIPASE, AMYLASE in the last 8760 hours. No results for input(s): AMMONIA in the last 8760 hours. CBC: Recent Labs    05/12/19 2118 05/12/19 2118 06/17/19 1151 07/13/19 1011 07/19/19 0648  WBC 6.5   < > 6.3 8.9 6.0  NEUTROABS 3.8  --  4.5  --   --   HGB 13.6   < > 14.2  13.4 12.6  HCT 42.7   < > 45.3 42.7 41.0  MCV 88.8   < > 89.7 89.5 90.7  PLT 208   < > 210 203 216   < > = values in this interval not displayed.   Lipid No results for input(s): CHOL, HDL, LDLCALC, TRIG in the last 8760 hours.  Cardiac Enzymes: Recent Labs    07/21/18 2107 07/13/19 1011 07/14/19 0551 07/17/19 0603  CKTOTAL  --  514* 1,060* 177  TROPONINI <0.03  --   --   --    BNP: No results for input(s): BNP in the last 8760 hours. No results found for: MICROALBUR No results found for: HGBA1C Lab Results  Component Value Date   TSH 1.814 12/20/2014   Lab Results  Component Value Date   VITAMINB12 255 12/20/2014   No results found for: FOLATE No results found for: IRON, TIBC, FERRITIN  Imaging and Procedures obtained prior to SNF admission: No results found.   Not all labs, radiology exams or other studies done during hospitalization come through on my EPIC note; however they are reviewed by me.    Assessment and Plan  Generalized weakness-patient lives alone with private caregiver several hours a day but not round-the-clock SNF-admitted for OT/PT  History of PE SNF-continue Xarelto 20 mg daily  HFpEF-.  Compensated euvolemic SNF-patient at risk for dehydration so Lasix was stopped  Parkinsonian tremors-patient on no medication for Parkinson's SNF-patient tremors are inconvenient but did not get in the way of her eating her food at least; will monitor  Recent COVID-19 infection-diagnosed on 06/17/2019; patient outside window for airborne isolation but certainly might still be fatigued from having Covid; continue supportive care   Time spent greater than 35 minutes;> 50% of time with patient was spent reviewing records, labs, tests and studies, counseling and developing plan of care  Hennie Duos, MD

## 2019-07-23 ENCOUNTER — Encounter: Payer: Self-pay | Admitting: Internal Medicine

## 2019-07-23 DIAGNOSIS — U071 COVID-19: Secondary | ICD-10-CM | POA: Insufficient documentation

## 2019-07-27 ENCOUNTER — Non-Acute Institutional Stay (SKILLED_NURSING_FACILITY): Payer: Medicare Other | Admitting: Adult Health

## 2019-07-27 ENCOUNTER — Encounter: Payer: Self-pay | Admitting: Adult Health

## 2019-07-27 DIAGNOSIS — I2782 Chronic pulmonary embolism: Secondary | ICD-10-CM

## 2019-07-27 DIAGNOSIS — R5381 Other malaise: Secondary | ICD-10-CM | POA: Diagnosis not present

## 2019-07-27 DIAGNOSIS — G20 Parkinson's disease: Secondary | ICD-10-CM

## 2019-07-27 DIAGNOSIS — I5032 Chronic diastolic (congestive) heart failure: Secondary | ICD-10-CM | POA: Diagnosis not present

## 2019-07-27 DIAGNOSIS — E440 Moderate protein-calorie malnutrition: Secondary | ICD-10-CM

## 2019-07-27 NOTE — Progress Notes (Signed)
Location:    [place] Room Number: 160F Place of Service:  SNF (31) Phillips Grout NP    CODE STATUS: DNR  Allergies  Allergen Reactions  . Contrast Media [Iodinated Diagnostic Agents] Anaphylaxis and Shortness Of Breath  . Vancomycin Shortness Of Breath  . Vioxx [Rofecoxib] Other (See Comments)    REACTION: irregular heartbeat    Chief Complaint  Patient presents with  . Medical Management of Chronic Issues         Chronic diastolic CHF (congestive heart failure)  Chronic pulmonary embolism without acute cor pulmonale unspecified pulmonary embolism type  Moderate protein calorie malnutrition:  Weekly follow up for the first 30 days post hospitalization.      HPI:  She is a short term rehab patient being seen for the management of her chronic illnesses: chf; PE: malnutrition. There are no reports of changes in appetite; no uncontrolled pain; no agitation or anxiety.   Past Medical History:  Diagnosis Date  . CHF (congestive heart failure) ([place])   . History of pulmonary embolism   . Parkinson's disease ([place])   . Venous stasis     History reviewed. No pertinent surgical history.  Social History   Socioeconomic History  . Marital status: Single    Spouse name: Not on file  . Number of children: Not on file  . Years of education: Not on file  . Highest education level: Not on file  Occupational History  . Not on file  Tobacco Use  . Smoking status: Never Smoker  . Smokeless tobacco: Never Used  Substance and Sexual Activity  . Alcohol use: No  . Drug use: No  . Sexual activity: Not on file  Other Topics Concern  . Not on file  Social History Narrative  . Not on file   Social Determinants of Health   Financial Resource Strain:   . Difficulty of Paying Living Expenses: Not on file  Food Insecurity:   . Worried About Charity fundraiser in the Last Year: Not on file  . Ran Out of Food in the Last Year: Not on file  Transportation  Needs:   . Lack of Transportation (Medical): Not on file  . Lack of Transportation (Non-Medical): Not on file  Physical Activity:   . Days of Exercise per Week: Not on file  . Minutes of Exercise per Session: Not on file  Stress:   . Feeling of Stress : Not on file  Social Connections:   . Frequency of Communication with Friends and Family: Not on file  . Frequency of Social Gatherings with Friends and Family: Not on file  . Attends Religious Services: Not on file  . Active Member of Clubs or Organizations: Not on file  . Attends Archivist Meetings: Not on file  . Marital Status: Not on file  Intimate Partner Violence:   . Fear of Current or Ex-Partner: Not on file  . Emotionally Abused: Not on file  . Physically Abused: Not on file  . Sexually Abused: Not on file   Family History  Problem Relation Age of Onset  . Heart disease Mother        67s      VITAL SIGNS BP 112/79   Pulse 72   Temp 97.8 F (36.6 C) (Oral)   Resp 18   Ht 5\' 2"  (1.575 m)   Wt 139 lb 12.8 oz (63.4 kg)   BMI 25.57 kg/m   Outpatient Encounter  Medications as of 07/27/2019  Medication Sig  . acetaminophen (TYLENOL) 325 MG tablet Take 2 tablets (650 mg total) by mouth every 6 (six) hours as needed for mild pain, fever or headache (or Fever >/= 101).  . feeding supplement, ENSURE ENLIVE, (ENSURE ENLIVE) LIQD Take 237 mLs by mouth 2 (two) times daily between meals.  . NON FORMULARY Diet NAS, thin liquids  . rivaroxaban (XARELTO) 20 MG TABS tablet Take 1 tablet (20 mg total) by mouth daily with supper.  . [DISCONTINUED] LORazepam (ATIVAN) 2 MG/ML concentrated solution Take 0.25 mg by mouth once.   No facility-administered encounter medications on file as of 07/27/2019.     SIGNIFICANT DIAGNOSTIC EXAMS  LABS REVIEWED   07-13-19: wbc 8.9; hgb 13.4; hct 42.7; mcv 89.5 plt 203; glucose 115; bun 14; creat 0.49; k+ 3.7; na++ 140; ca 8.5 urine culure no growth 07-19-19; wbc 6.0; hgb 12.6; hct 41.0.  mcv 90.7 plt 216; glucose 79; bun 19; creat 0.58; k+ 4.4 na++ 137; ca 8.6   Review of Systems  Constitutional: Negative for malaise/fatigue.  Respiratory: Negative for cough and shortness of breath.   Cardiovascular: Negative for chest pain, palpitations and leg swelling.  Gastrointestinal: Negative for abdominal pain, constipation and heartburn.  Musculoskeletal: Negative for back pain, joint pain and myalgias.  Skin: Negative.   Neurological: Negative for dizziness.  Psychiatric/Behavioral: The patient is not nervous/anxious.     Physical Exam Constitutional:      General: She is not in acute distress.    Appearance: She is well-developed. She is not diaphoretic.  Neck:     Thyroid: No thyromegaly.  Cardiovascular:     Rate and Rhythm: Normal rate and regular rhythm.     Pulses: Normal pulses.     Heart sounds: Normal heart sounds.  Pulmonary:     Effort: Pulmonary effort is normal. No respiratory distress.     Breath sounds: Normal breath sounds.  Abdominal:     General: Bowel sounds are normal. There is no distension.     Palpations: Abdomen is soft.     Tenderness: There is no abdominal tenderness.  Musculoskeletal:     Cervical back: Neck supple.     Right lower leg: No edema.     Left lower leg: No edema.     Comments: Neck is weak Is able to move all extremities right extremity with ataxic movements   Lymphadenopathy:     Cervical: No cervical adenopathy.  Skin:    General: Skin is warm and dry.  Neurological:     Mental Status: She is alert. Mental status is at baseline.  Psychiatric:        Mood and Affect: Mood normal.      ASSESSMENT/ PLAN:  TODAY;   1. Physical debility: no change in status will continue therapy as directed to improve upon her level of independence with her adls   2. Chronic diastolic CHF (congestive heart failure) is compensated will monitor   3. Parkinson's disease: no change in status will continue to monitor her status.   4.  Chronic pulmonary embolism without acute cor pulmonale unspecified pulmonary embolism type is stable will continue xarelto 20 mg daily   5. Moderate protein calorie malnutrition: is without change weight is 139 pounds will continue ensure twice daily   MD is aware of resident's narcotic use and is in agreement with current plan of care. We will attempt to wean resident as appropriate.  Synthia Innocent NP [facility]  Contact [phone] Monday through Friday 8am- 5pm  After hours call [phone]

## 2019-08-02 DIAGNOSIS — E46 Unspecified protein-calorie malnutrition: Secondary | ICD-10-CM | POA: Insufficient documentation

## 2019-08-03 ENCOUNTER — Non-Acute Institutional Stay (SKILLED_NURSING_FACILITY): Payer: Medicare Other | Admitting: Adult Health

## 2019-08-03 ENCOUNTER — Encounter: Payer: Self-pay | Admitting: Adult Health

## 2019-08-03 DIAGNOSIS — G20 Parkinson's disease: Secondary | ICD-10-CM | POA: Diagnosis not present

## 2019-08-03 DIAGNOSIS — I2782 Chronic pulmonary embolism: Secondary | ICD-10-CM | POA: Diagnosis not present

## 2019-08-03 DIAGNOSIS — I5032 Chronic diastolic (congestive) heart failure: Secondary | ICD-10-CM

## 2019-08-03 NOTE — Progress Notes (Signed)
Location:    [place] Room Number: 144 Place of Service:  SNF (31) Phillips Grout NP    CODE STATUS: DNR  Allergies  Allergen Reactions  . Contrast Media [Iodinated Diagnostic Agents] Anaphylaxis and Shortness Of Breath  . Vancomycin Shortness Of Breath  . Vioxx [Rofecoxib] Other (See Comments)    REACTION: irregular heartbeat    Chief Complaint  Patient presents with  . Medical Management of Chronic Issues         Chronic diastolic CHF (congestive heart failure)   Parkinson's disease  Chronic pulmonary embolism without acute cor pulmonale unspecified pulmonary embolism type:  Weekly follow up for the first 30 days post hospitalization.     HPI:  She is a 85 year old short term rehab patient being seen for the management of her chronic illnesses: chf; parkinson disease; pe. There are no reports of changes in appetite; no reports of uncontrolled pain; no cough or shortness of breath.   Past Medical History:  Diagnosis Date  . CHF (congestive heart failure) ([place])   . History of pulmonary embolism   . Parkinson's disease ([place])   . Venous stasis     History reviewed. No pertinent surgical history.  Social History   Socioeconomic History  . Marital status: Single    Spouse name: Not on file  . Number of children: Not on file  . Years of education: Not on file  . Highest education level: Not on file  Occupational History  . Not on file  Tobacco Use  . Smoking status: Never Smoker  . Smokeless tobacco: Never Used  Substance and Sexual Activity  . Alcohol use: No  . Drug use: No  . Sexual activity: Not on file  Other Topics Concern  . Not on file  Social History Narrative  . Not on file   Social Determinants of Health   Financial Resource Strain:   . Difficulty of Paying Living Expenses: Not on file  Food Insecurity:   . Worried About Charity fundraiser in the Last Year: Not on file  . Ran Out of Food in the Last Year: Not on file    Transportation Needs:   . Lack of Transportation (Medical): Not on file  . Lack of Transportation (Non-Medical): Not on file  Physical Activity:   . Days of Exercise per Week: Not on file  . Minutes of Exercise per Session: Not on file  Stress:   . Feeling of Stress : Not on file  Social Connections:   . Frequency of Communication with Friends and Family: Not on file  . Frequency of Social Gatherings with Friends and Family: Not on file  . Attends Religious Services: Not on file  . Active Member of Clubs or Organizations: Not on file  . Attends Archivist Meetings: Not on file  . Marital Status: Not on file  Intimate Partner Violence:   . Fear of Current or Ex-Partner: Not on file  . Emotionally Abused: Not on file  . Physically Abused: Not on file  . Sexually Abused: Not on file   Family History  Problem Relation Age of Onset  . Heart disease Mother        15s      VITAL SIGNS BP 108/70   Pulse 73   Temp (!) 96.8 F (36 C) (Oral)   Resp 20   Ht 5\' 2"  (1.575 m)   Wt 139 lb 12.8 oz (63.4 kg)  BMI 25.57 kg/m   Outpatient Encounter Medications as of 08/03/2019  Medication Sig  . acetaminophen (TYLENOL) 325 MG tablet Take 2 tablets (650 mg total) by mouth every 6 (six) hours as needed for mild pain, fever or headache (or Fever >/= 101).  . feeding supplement, ENSURE ENLIVE, (ENSURE ENLIVE) LIQD Take 237 mLs by mouth 2 (two) times daily between meals.  . NON FORMULARY Diet NAS, thin liquids  . rivaroxaban (XARELTO) 20 MG TABS tablet Take 1 tablet (20 mg total) by mouth daily with supper.   No facility-administered encounter medications on file as of 08/03/2019.     SIGNIFICANT DIAGNOSTIC EXAMS   LABS REVIEWED PREVIOUS    07-13-19: wbc 8.9; hgb 13.4; hct 42.7; mcv 89.5 plt 203; glucose 115; bun 14; creat 0.49; k+ 3.7; na++ 140; ca 8.5 urine culure no growth 07-19-19; wbc 6.0; hgb 12.6; hct 41.0. mcv 90.7 plt 216; glucose 79; bun 19; creat 0.58; k+ 4.4 na++  137; ca 8.6  NO NEW LABS.    Review of Systems  Constitutional: Negative for malaise/fatigue.  Respiratory: Negative for cough and shortness of breath.   Cardiovascular: Negative for chest pain, palpitations and leg swelling.  Gastrointestinal: Negative for abdominal pain, constipation and heartburn.  Musculoskeletal: Negative for back pain, joint pain and myalgias.  Skin: Negative.   Neurological: Negative for dizziness.  Psychiatric/Behavioral: The patient is not nervous/anxious.     Physical Exam Constitutional:      General: She is not in acute distress.    Appearance: She is well-developed. She is not diaphoretic.  Neck:     Thyroid: No thyromegaly.  Cardiovascular:     Rate and Rhythm: Normal rate and regular rhythm.     Pulses: Normal pulses.     Heart sounds: Normal heart sounds.  Pulmonary:     Effort: Pulmonary effort is normal. No respiratory distress.     Breath sounds: Normal breath sounds.  Abdominal:     General: Bowel sounds are normal. There is no distension.     Palpations: Abdomen is soft.     Tenderness: There is no abdominal tenderness.  Musculoskeletal:     Cervical back: Neck supple.     Right lower leg: No edema.     Left lower leg: No edema.     Comments: Neck is weak has contracture  Is able to move all extremities right extremity with ataxic movements    Lymphadenopathy:     Cervical: No cervical adenopathy.  Skin:    General: Skin is warm and dry.  Neurological:     Mental Status: She is alert. Mental status is at baseline.  Psychiatric:        Mood and Affect: Mood normal.     ASSESSMENT/ PLAN:  TODAY;   1. Chronic diastolic CHF (congestive heart failure) compensated will monitor  2. Parkinson's disease no changes in status will monitor   3. Chronic pulmonary embolism without acute cor pulmonale unspecified pulmonary embolism type: is stable will continue long term xarelto 20 mg daily   PREVIOUS  4. Moderate protein calorie  malnutrition: is without change weight is 139 pounds will continue ensure twice daily   5. Physical debility: no change in status will continue therapy as directed to improve upon her level of independence with her 5dls    MD is aware of resident's narcotic use and is in agreement with current plan of care. We will attempt to wean resident as appropriate.  Synthia Innocent NP [facility]  Contact [phone] Monday through Friday 8am- 5pm  After hours call [phone]

## 2019-08-08 ENCOUNTER — Encounter: Payer: Self-pay | Admitting: Adult Health

## 2019-08-08 ENCOUNTER — Non-Acute Institutional Stay (SKILLED_NURSING_FACILITY): Payer: Medicare Other | Admitting: Adult Health

## 2019-08-08 DIAGNOSIS — G20 Parkinson's disease: Secondary | ICD-10-CM | POA: Diagnosis not present

## 2019-08-08 DIAGNOSIS — I5032 Chronic diastolic (congestive) heart failure: Secondary | ICD-10-CM

## 2019-08-08 DIAGNOSIS — I2782 Chronic pulmonary embolism: Secondary | ICD-10-CM

## 2019-08-08 NOTE — Progress Notes (Signed)
Location:  [facility] Room Number: 144-P Place of Service:  SNF (31)   CODE STATUS: DNR  Allergies  Allergen Reactions  . Contrast Media [Iodinated Diagnostic Agents] Anaphylaxis and Shortness Of Breath  . Vancomycin Shortness Of Breath  . Vioxx [Rofecoxib] Other (See Comments)    REACTION: irregular heartbeat    Chief Complaint  Patient presents with  . Medical Management of Chronic Issues        Chronic pulmonary embolism without acute cor pulmonale unspecified pulmonary embolism type  Chronic diastolic CHF (congestive heart failure)Parkinson's disease  Weekly follow up for the first 30 days post hospitalization.     HPI:  She is a 85 year old short term rehab patient being seen for the management of her chronic illnesses: PE; chf; parkinson disease. There are no reports of changes in appetite; no reports of insomnia; no reports of constipation; no reports of uncontrolled pain.   Past Medical History:  Diagnosis Date  . CHF (congestive heart failure) ([facility])   . History of pulmonary embolism   . Parkinson's disease ([facility])   . Venous stasis     History reviewed. No pertinent surgical history.  Social History   Socioeconomic History  . Marital status: Single    Spouse name: Not on file  . Number of children: Not on file  . Years of education: Not on file  . Highest education level: Not on file  Occupational History  . Not on file  Tobacco Use  . Smoking status: Never Smoker  . Smokeless tobacco: Never Used  Substance and Sexual Activity  . Alcohol use: No  . Drug use: No  . Sexual activity: Not on file  Other Topics Concern  . Not on file  Social History Narrative  . Not on file   Social Determinants of Health   Financial Resource Strain:   . Difficulty of Paying Living Expenses: Not on file  Food Insecurity:   . Worried About Programme researcher, broadcasting/film/video in the Last Year: Not on file  . Ran Out of Food in the Last Year: Not on file    Transportation Needs:   . Lack of Transportation (Medical): Not on file  . Lack of Transportation (Non-Medical): Not on file  Physical Activity:   . Days of Exercise per Week: Not on file  . Minutes of Exercise per Session: Not on file  Stress:   . Feeling of Stress : Not on file  Social Connections:   . Frequency of Communication with Friends and Family: Not on file  . Frequency of Social Gatherings with Friends and Family: Not on file  . Attends Religious Services: Not on file  . Active Member of Clubs or Organizations: Not on file  . Attends Banker Meetings: Not on file  . Marital Status: Not on file  Intimate Partner Violence:   . Fear of Current or Ex-Partner: Not on file  . Emotionally Abused: Not on file  . Physically Abused: Not on file  . Sexually Abused: Not on file   Family History  Problem Relation Age of Onset  . Heart disease Mother        66s      VITAL SIGNS BP 101/60   Pulse 84   Temp 98 F (36.7 C) (Oral)   Resp 18   Ht 5\' 2"  (1.575 m)   Wt 139 lb 12.8 oz (63.4 kg)   BMI 25.57 kg/m   Outpatient Encounter Medications as  of 08/08/2019  Medication Sig  . NON FORMULARY Diet NAS, thin liquids  . acetaminophen (TYLENOL) 325 MG tablet Take 2 tablets (650 mg total) by mouth every 6 (six) hours as needed for mild pain, fever or headache (or Fever >/= 101).  . feeding supplement, ENSURE ENLIVE, (ENSURE ENLIVE) LIQD Take 237 mLs by mouth 2 (two) times daily between meals.  . rivaroxaban (XARELTO) 20 MG TABS tablet Take 1 tablet (20 mg total) by mouth daily with supper.   No facility-administered encounter medications on file as of 08/08/2019.     SIGNIFICANT DIAGNOSTIC EXAMS    LABS REVIEWED PREVIOUS    07-13-19: wbc 8.9; hgb 13.4; hct 42.7; mcv 89.5 plt 203; glucose 115; bun 14; creat 0.49; k+ 3.7; na++ 140; ca 8.5 urine culure no growth 07-19-19; wbc 6.0; hgb 12.6; hct 41.0. mcv 90.7 plt 216; glucose 79; bun 19; creat 0.58; k+ 4.4 na++  137; ca 8.6  NO NEW LABS.    Review of Systems  Constitutional: Negative for malaise/fatigue.  Respiratory: Negative for cough and shortness of breath.   Cardiovascular: Negative for chest pain, palpitations and leg swelling.  Gastrointestinal: Negative for abdominal pain, constipation and heartburn.  Musculoskeletal: Negative for back pain, joint pain and myalgias.  Skin: Negative.   Neurological: Negative for dizziness.  Psychiatric/Behavioral: The patient is not nervous/anxious.       Physical Exam Constitutional:      General: She is not in acute distress.    Appearance: She is well-developed. She is not diaphoretic.  Neck:     Thyroid: No thyromegaly.  Cardiovascular:     Rate and Rhythm: Normal rate and regular rhythm.     Pulses: Normal pulses.     Heart sounds: Normal heart sounds.  Pulmonary:     Effort: Pulmonary effort is normal. No respiratory distress.     Breath sounds: Normal breath sounds.  Abdominal:     General: Bowel sounds are normal. There is no distension.     Palpations: Abdomen is soft.     Tenderness: There is no abdominal tenderness.  Musculoskeletal:     Cervical back: Neck supple.     Right lower leg: No edema.     Left lower leg: No edema.     Comments: Neck is weak has contracture  Is able to move all extremities right extremity with ataxic movements   Lymphadenopathy:     Cervical: No cervical adenopathy.  Skin:    General: Skin is warm and dry.  Neurological:     Mental Status: She is alert. Mental status is at baseline.  Psychiatric:        Mood and Affect: Mood normal.      ASSESSMENT/ PLAN:  TODAY;   1. Chronic pulmonary embolism without acute cor pulmonale unspecified pulmonary embolism type: is stable is on long term xarelto 20 mg daily   2. Chronic diastolic CHF (congestive heart failure) is compensate will monitor   3. Parkinson's disease no change in status will monitor    PREVIOUS  4. Moderate protein calorie  malnutrition: is without change weight is 139 pounds will continue ensure twice daily   5. Physical debility: no change in status will continue therapy as directed to improve upon her level of independence with her 5dls         MD is aware of resident's narcotic use and is in agreement with current plan of care. We will attempt to wean resident as appropriate.  Ok Edwards NP  [place] [phone] Monday through Friday 8am- 5pm  After hours call [phone]

## 2019-08-18 ENCOUNTER — Other Ambulatory Visit (HOSPITAL_COMMUNITY)
Admission: AD | Admit: 2019-08-18 | Discharge: 2019-08-18 | Disposition: A | Discharge status: Home / Self Care | Payer: Medicare Other | Source: Skilled Nursing Facility | Attending: Internal Medicine | Admitting: Internal Medicine

## 2019-08-18 DIAGNOSIS — N390 Urinary tract infection, site not specified: Secondary | ICD-10-CM | POA: Diagnosis present

## 2019-08-18 LAB — URINALYSIS, ROUTINE W REFLEX MICROSCOPIC
Bilirubin Urine: NEGATIVE
Glucose, UA: NEGATIVE mg/dL
Ketones, ur: NEGATIVE mg/dL
Nitrite: NEGATIVE
Protein, ur: 30 mg/dL — AB
RBC / HPF: >50 RBC/hpf — ABNORMAL HIGH (ref 0–5)
Specific Gravity, Urine: 1.03 (ref 1.005–1.030)
pH: 5 (ref 5.0–8.0)

## 2019-08-20 LAB — URINE CULTURE: Culture: <10000 — AB

## 2019-08-31 ENCOUNTER — Non-Acute Institutional Stay (SKILLED_NURSING_FACILITY): Payer: Medicare Other | Admitting: Adult Health

## 2019-08-31 ENCOUNTER — Encounter: Payer: Self-pay | Admitting: Adult Health

## 2019-08-31 DIAGNOSIS — G20 Parkinson's disease: Secondary | ICD-10-CM

## 2019-08-31 DIAGNOSIS — I2782 Chronic pulmonary embolism: Secondary | ICD-10-CM

## 2019-08-31 DIAGNOSIS — E440 Moderate protein-calorie malnutrition: Secondary | ICD-10-CM | POA: Diagnosis not present

## 2019-08-31 NOTE — Progress Notes (Signed)
Location:    [facility] Room Number: 102/P Place of Service:  SNF (31)   CODE STATUS: DNR  Allergies  Allergen Reactions  . Contrast Media [Iodinated Diagnostic Agents] Anaphylaxis and Shortness Of Breath  . Vancomycin Shortness Of Breath  . Vioxx [Rofecoxib] Other (See Comments)    REACTION: irregular heartbeat    Chief Complaint  Patient presents with  . Medical Management of Chronic Issues       Chronic pulmonary embolism without acute cor pulmonale unspecified pulmonary embolism type:    Moderate protein calorie malnutrition:  Parkinson's disease     HPI:  She is a 85 year old long term resident of this facility being seen for the management of her chronic illnesses: PE; malnutrition; parkinson disease. There are no reports of uncontrolled pain. She will decline to eat at times. She will get agitated at staff members. She is losing weight.   Past Medical History:  Diagnosis Date  . CHF (congestive heart failure) ([place])   . History of pulmonary embolism   . Parkinson's disease ([place])   . Venous stasis     No past surgical history on file.  Social History   Socioeconomic History  . Marital status: Single    Spouse name: Not on file  . Number of children: Not on file  . Years of education: Not on file  . Highest education level: Not on file  Occupational History  . Not on file  Tobacco Use  . Smoking status: Never Smoker  . Smokeless tobacco: Never Used  Substance and Sexual Activity  . Alcohol use: No  . Drug use: No  . Sexual activity: Not on file  Other Topics Concern  . Not on file  Social History Narrative  . Not on file   Social Determinants of Health   Financial Resource Strain:   . Difficulty of Paying Living Expenses:   Food Insecurity:   . Worried About Charity fundraiser in the Last Year:   . Arboriculturist in the Last Year:   Transportation Needs:   . Film/video editor (Medical):   Marland Kitchen Lack of Transportation  (Non-Medical):   Physical Activity:   . Days of Exercise per Week:   . Minutes of Exercise per Session:   Stress:   . Feeling of Stress :   Social Connections:   . Frequency of Communication with Friends and Family:   . Frequency of Social Gatherings with Friends and Family:   . Attends Religious Services:   . Active Member of Clubs or Organizations:   . Attends Archivist Meetings:   Marland Kitchen Marital Status:   Intimate Partner Violence:   . Fear of Current or Ex-Partner:   . Emotionally Abused:   Marland Kitchen Physically Abused:   . Sexually Abused:    Family History  Problem Relation Age of Onset  . Heart disease Mother        47s      VITAL SIGNS BP 132/73   Pulse 68   Temp 98.3 F (36.8 C) (Oral)   Resp 20   Ht 5\' 2"  (1.575 m)   Wt 128 lb 3.2 oz (58.2 kg)   BMI 23.45 kg/m   Outpatient Encounter Medications as of 08/31/2019  Medication Sig  . acetaminophen (TYLENOL) 325 MG tablet Take 2 tablets (650 mg total) by mouth every 6 (six) hours as needed for mild pain, fever or headache (or Fever >/= 101).  Roseanne Kaufman  [place] (VENELEX) OINT Special Instructions: apply to buttocks q shift and PRN for abrasion Every Shift Day, Evening, Night  . NON FORMULARY Diet NAS, thin liquids  . rivaroxaban (XARELTO) 20 MG TABS tablet Take 1 tablet (20 mg total) by mouth daily with supper.  . [DISCONTINUED] feeding supplement, ENSURE ENLIVE, (ENSURE ENLIVE) LIQD Take 237 mLs by mouth 2 (two) times daily between meals.   No facility-administered encounter medications on file as of 08/31/2019.     SIGNIFICANT DIAGNOSTIC EXAMS  LABS REVIEWED PREVIOUS    07-13-19: wbc 8.9; hgb 13.4; hct 42.7; mcv 89.5 plt 203; glucose 115; bun 14; creat 0.49; k+ 3.7; na++ 140; ca 8.5 urine culure no growth 07-19-19; wbc 6.0; hgb 12.6; hct 41.0. mcv 90.7 plt 216; glucose 79; bun 19; creat 0.58; k+ 4.4 na++ 137; ca 8.6  TODAY  08-18-19 urine culture: <10,000   Review of Systems  Constitutional:  Negative for malaise/fatigue.  Respiratory: Negative for cough and shortness of breath.   Cardiovascular: Negative for chest pain, palpitations and leg swelling.  Gastrointestinal: Negative for abdominal pain, constipation and heartburn.  Musculoskeletal: Negative for back pain, joint pain and myalgias.  Skin: Negative.   Neurological: Negative for dizziness.  Psychiatric/Behavioral: The patient is not nervous/anxious.     Physical Exam Constitutional:      General: She is not in acute distress.    Appearance: She is well-developed. She is not diaphoretic.  Neck:     Thyroid: No thyromegaly.  Cardiovascular:     Rate and Rhythm: Normal rate and regular rhythm.     Pulses: Normal pulses.     Heart sounds: Normal heart sounds.  Pulmonary:     Effort: Pulmonary effort is normal. No respiratory distress.     Breath sounds: Normal breath sounds.  Abdominal:     General: Bowel sounds are normal. There is no distension.     Palpations: Abdomen is soft.     Tenderness: There is no abdominal tenderness.  Musculoskeletal:     Cervical back: Neck supple.     Right lower leg: No edema.     Left lower leg: No edema.     Comments: Neck is weak has contracture  Is able to move all extremities right extremity with ataxic movements    Lymphadenopathy:     Cervical: No cervical adenopathy.  Skin:    General: Skin is warm and dry.  Neurological:     Mental Status: She is alert. Mental status is at baseline.  Psychiatric:        Mood and Affect: Mood normal.      ASSESSMENT/ PLAN:  TODAY;   1. Chronic pulmonary embolism without acute cor pulmonale unspecified pulmonary embolism type: is stable is on long xarelto 20 mg daily   2. Moderate protein calorie malnutrition: is losing weight is presently 128 pounds will continue supplements as directed.   3. Parkinson's disease no change in status will monitor   PREVIOUS  4. Physical debility: no change in status will monitor   5.  Chronic diastolic CHF (congestive heart failure) is compensate will monitor    MD is aware of resident's narcotic use and is in agreement with current plan of care. We will attempt to wean resident as appropriate.  Synthia Innocent NP [facility]  Contact [phone] Monday through Friday 8am- 5pm  After hours call [phone]

## 2019-09-07 ENCOUNTER — Encounter: Payer: Self-pay | Admitting: Adult Health

## 2019-09-07 ENCOUNTER — Non-Acute Institutional Stay (SKILLED_NURSING_FACILITY): Payer: Medicare Other | Admitting: Adult Health

## 2019-09-07 DIAGNOSIS — G20 Parkinson's disease: Secondary | ICD-10-CM | POA: Diagnosis not present

## 2019-09-07 DIAGNOSIS — R531 Weakness: Secondary | ICD-10-CM | POA: Diagnosis not present

## 2019-09-07 DIAGNOSIS — I5032 Chronic diastolic (congestive) heart failure: Secondary | ICD-10-CM | POA: Diagnosis not present

## 2019-09-07 NOTE — Progress Notes (Signed)
Location:    [facility] Room Number: 102/P Place of Service:  SNF (31)   CODE STATUS: DNR  Allergies  Allergen Reactions  . Contrast Media [Iodinated Diagnostic Agents] Anaphylaxis and Shortness Of Breath  . Vancomycin Shortness Of Breath  . Vioxx [Rofecoxib] Other (See Comments)    REACTION: irregular heartbeat    Chief Complaint  Patient presents with  . Acute Visit    Care Plan Meeting    HPI:  We have come together for her care plan meeting. She is on compassionate care. She has declined visits from her friend Museum/gallery conservator and her pastor. She will not speak to her sister.  She has a bruise on her forehead from having people holding her head up. She has lost 30 pounds in the past 2 months. She will decline to eat if she does not like the staff member; will make excuses not to eat. She is a DNR. She does not want a peg tube placed for feeding. She is unable willing to discuss any other further care choices. She denies any uncontrolled pain.   Past Medical History:  Diagnosis Date  . CHF (congestive heart failure) ([place])   . History of pulmonary embolism   . Parkinson's disease ([place])   . Venous stasis     No past surgical history on file.  Social History   Socioeconomic History  . Marital status: Single    Spouse name: Not on file  . Number of children: Not on file  . Years of education: Not on file  . Highest education level: Not on file  Occupational History  . Not on file  Tobacco Use  . Smoking status: Never Smoker  . Smokeless tobacco: Never Used  Substance and Sexual Activity  . Alcohol use: No  . Drug use: No  . Sexual activity: Not on file  Other Topics Concern  . Not on file  Social History Narrative  . Not on file   Social Determinants of Health   Financial Resource Strain:   . Difficulty of Paying Living Expenses:   Food Insecurity:   . Worried About Charity fundraiser in the Last Year:   . Arboriculturist in the Last Year:    Transportation Needs:   . Film/video editor (Medical):   Marland Kitchen Lack of Transportation (Non-Medical):   Physical Activity:   . Days of Exercise per Week:   . Minutes of Exercise per Session:   Stress:   . Feeling of Stress :   Social Connections:   . Frequency of Communication with Friends and Family:   . Frequency of Social Gatherings with Friends and Family:   . Attends Religious Services:   . Active Member of Clubs or Organizations:   . Attends Archivist Meetings:   Marland Kitchen Marital Status:   Intimate Partner Violence:   . Fear of Current or Ex-Partner:   . Emotionally Abused:   Marland Kitchen Physically Abused:   . Sexually Abused:    Family History  Problem Relation Age of Onset  . Heart disease Mother        73s      VITAL SIGNS BP 123/74   Pulse 82   Temp 98.6 F (37 C) (Oral)   Resp 20   Ht 5\' 2"  (1.575 m)   Wt 122 lb 6.4 oz (55.5 kg)   BMI 22.39 kg/m   Outpatient Encounter Medications as of 09/07/2019  Medication Sig  . acetaminophen (  TYLENOL) 325 MG tablet Take 2 tablets (650 mg total) by mouth every 6 (six) hours as needed for mild pain, fever or headache (or Fever >/= 101).  Lucilla Lame [place] [facility]) OINT Special Instructions: apply to buttocks q shift and PRN for abrasion Every Shift Day, Evening, Night  . NON FORMULARY Diet NAS, thin liquids  . rivaroxaban (XARELTO) 20 MG TABS tablet Take 1 tablet (20 mg total) by mouth daily with supper.   No facility-administered encounter medications on file as of 09/07/2019.     SIGNIFICANT DIAGNOSTIC EXAMS   LABS REVIEWED PREVIOUS    07-13-19: wbc 8.9; hgb 13.4; hct 42.7; mcv 89.5 plt 203; glucose 115; bun 14; creat 0.49; k+ 3.7; na++ 140; ca 8.5 urine culure no growth 07-19-19; wbc 6.0; hgb 12.6; hct 41.0. mcv 90.7 plt 216; glucose 79; bun 19; creat 0.58; k+ 4.4 na++ 137; ca 8.6 08-18-19 urine culture: <10,000   NO NEW LABS.   Review of Systems  Constitutional: Negative for malaise/fatigue.   Respiratory: Negative for cough and shortness of breath.   Cardiovascular: Negative for chest pain, palpitations and leg swelling.  Gastrointestinal: Negative for abdominal pain, constipation and heartburn.  Musculoskeletal: Negative for back pain, joint pain and myalgias.  Skin: Negative.   Neurological: Negative for dizziness.  Psychiatric/Behavioral: The patient is not nervous/anxious.    Physical Exam Constitutional:      General: She is not in acute distress.    Appearance: She is well-developed. She is not diaphoretic.  Neck:     Thyroid: No thyromegaly.  Cardiovascular:     Rate and Rhythm: Normal rate and regular rhythm.     Pulses: Normal pulses.     Heart sounds: Normal heart sounds.  Pulmonary:     Effort: Pulmonary effort is normal. No respiratory distress.     Breath sounds: Normal breath sounds.  Abdominal:     General: Bowel sounds are normal. There is no distension.     Palpations: Abdomen is soft.     Tenderness: There is no abdominal tenderness.  Musculoskeletal:     Cervical back: Neck supple.     Right lower leg: No edema.     Left lower leg: No edema.     Comments: Neck is weak has contracture  Is able to move all extremities right extremity with ataxic movements     Lymphadenopathy:     Cervical: No cervical adenopathy.  Skin:    General: Skin is warm and dry.     Comments: Bruise to forehead due to having staff hold up her head.   Neurological:     Mental Status: She is alert and oriented to person, place, and time.  Psychiatric:        Mood and Affect: Mood normal.      ASSESSMENT/ PLAN:  TODAY  1. Chronic diastolic CHF (Congstive heart failure)  2. parkinson's disease 3. Generalized weakness  Will continue DNR Does not want peg tube placement Is unwilling at this time to continue her discussion about advanced directives.       MD is aware of resident's narcotic use and is in agreement with current plan of care. We will attempt to  wean resident as appropriate.  Synthia Innocent NP [facility]  Contact [phone] Monday through Friday 8am- 5pm  After hours call [phone]

## 2019-09-11 ENCOUNTER — Other Ambulatory Visit (HOSPITAL_COMMUNITY)
Admission: AD | Admit: 2019-09-11 | Discharge: 2019-09-11 | Disposition: A | Discharge status: Home / Self Care | Payer: Medicare Other | Source: Other Acute Inpatient Hospital | Attending: Internal Medicine | Admitting: Internal Medicine

## 2019-09-11 DIAGNOSIS — N390 Urinary tract infection, site not specified: Secondary | ICD-10-CM | POA: Diagnosis present

## 2019-09-12 LAB — URINE CULTURE

## 2019-09-24 ENCOUNTER — Emergency Department (HOSPITAL_COMMUNITY): Payer: Medicare Other

## 2019-09-24 ENCOUNTER — Inpatient Hospital Stay
Admission: RE | Admit: 2019-09-24 | Discharge: 2022-07-13 | Disposition: A | Discharge status: Skilled Nursing Facility | Payer: Medicare Other | Source: Ambulatory Visit | Attending: Internal Medicine | Admitting: Internal Medicine

## 2019-09-24 ENCOUNTER — Emergency Department (HOSPITAL_COMMUNITY)
Admission: EM | Admit: 2019-09-24 | Discharge: 2019-09-24 | Disposition: A | Discharge status: Skilled Nursing Facility | Payer: Medicare Other | Attending: Emergency Medicine | Admitting: Emergency Medicine

## 2019-09-24 ENCOUNTER — Encounter (HOSPITAL_COMMUNITY): Payer: Self-pay | Admitting: *Deleted

## 2019-09-24 DIAGNOSIS — R0789 Other chest pain: Secondary | ICD-10-CM | POA: Diagnosis present

## 2019-09-24 DIAGNOSIS — Z7901 Long term (current) use of anticoagulants: Secondary | ICD-10-CM | POA: Diagnosis not present

## 2019-09-24 DIAGNOSIS — Z8616 Personal history of COVID-19: Secondary | ICD-10-CM | POA: Insufficient documentation

## 2019-09-24 DIAGNOSIS — R079 Chest pain, unspecified: Secondary | ICD-10-CM

## 2019-09-24 DIAGNOSIS — I5032 Chronic diastolic (congestive) heart failure: Secondary | ICD-10-CM | POA: Diagnosis not present

## 2019-09-24 DIAGNOSIS — Z86711 Personal history of pulmonary embolism: Secondary | ICD-10-CM | POA: Insufficient documentation

## 2019-09-24 DIAGNOSIS — R1310 Dysphagia, unspecified: Secondary | ICD-10-CM | POA: Diagnosis not present

## 2019-09-24 DIAGNOSIS — R0602 Shortness of breath: Secondary | ICD-10-CM | POA: Diagnosis not present

## 2019-09-24 DIAGNOSIS — G20 Parkinson's disease: Secondary | ICD-10-CM | POA: Insufficient documentation

## 2019-09-24 LAB — BASIC METABOLIC PANEL
Anion gap: 8 (ref 5–15)
BUN: 18 mg/dL (ref 8–23)
CO2: 26 mmol/L (ref 22–32)
Calcium: 9.2 mg/dL (ref 8.9–10.3)
Chloride: 105 mmol/L (ref 98–111)
Creatinine, Ser: 0.54 mg/dL (ref 0.44–1.00)
GFR calc Af Amer: >60 mL/min (ref >60)
GFR calc non Af Amer: >60 mL/min (ref >60)
Glucose, Bld: 93 mg/dL (ref 70–99)
Potassium: 3.6 mmol/L (ref 3.5–5.1)
Sodium: 139 mmol/L (ref 135–145)

## 2019-09-24 LAB — CBC
HCT: 44.3 % (ref 36.0–46.0)
Hemoglobin: 14 g/dL (ref 12.0–15.0)
MCH: 28.3 pg (ref 26.0–34.0)
MCHC: 31.6 g/dL (ref 30.0–36.0)
MCV: 89.7 fL (ref 80.0–100.0)
Platelets: 213 10*3/uL (ref 150–400)
RBC: 4.94 MIL/uL (ref 3.87–5.11)
RDW: 14 % (ref 11.5–15.5)
WBC: 6.5 10*3/uL (ref 4.0–10.5)
nRBC: 0 % (ref 0.0–0.2)

## 2019-09-24 LAB — TROPONIN I (HIGH SENSITIVITY)
Troponin I (High Sensitivity): 4 ng/L (ref <18)
Troponin I (High Sensitivity): 3 ng/L (ref <18)

## 2019-09-24 LAB — BRAIN NATRIURETIC PEPTIDE: B Natriuretic Peptide: 98 pg/mL (ref 0.0–100.0)

## 2019-09-24 NOTE — ED Provider Notes (Signed)
[facility] EMERGENCY DEPARTMENT Provider Note   CSN: [id number] Arrival date & time: 09/24/19  1649     History Chief Complaint  Patient presents with  . Chest Pain    [name] is a 85 y.o. female.  She is coming by ambulance from [facility] for complaint of chest pain that began for today.  She also has a prior history of a PE.  She has multiple complaints including chest pain that is mild now, some shortness of breath and also some difficulty swallowing/sore throat.  It sounds like she has had all these things before but she said today they were a little worse.  She is a rather vague historian.  Chronic Parkinson's immobile  The history is provided by the patient.  Chest Pain Pain location:  L chest Pain quality: pressure   Pain radiates to:  Does not radiate Pain severity:  Mild Onset quality:  Gradual Duration:  2 hours Timing:  Intermittent Progression:  Improving Chronicity:  Recurrent Context: at rest   Relieved by:  None tried Worsened by:  Nothing Ineffective treatments:  None tried Associated symptoms: dysphagia and shortness of breath   Associated symptoms: no abdominal pain, no cough, no diaphoresis, no fever, no headache, no nausea, no syncope and no vomiting        Past Medical History:  Diagnosis Date  . CHF (congestive heart failure) ([place])   . History of pulmonary embolism   . Parkinson's disease ([place])   . Venous stasis     Patient Active Problem List   Diagnosis Date Noted  . Protein-calorie malnutrition ([place]) 08/02/2019  . COVID-19 virus infection 07/23/2019  . Ambulatory dysfunction 07/13/2019  . Goals of care, counseling/discussion   . Palliative care by specialist   . DNR (do not resuscitate) discussion   . Supratherapeutic INR 02/24/2018  . Physical debility 02/24/2018  . Streptococcal bacteremia 07/09/2017  . Weakness 12/21/2014  . Leg weakness 12/20/2014  . Lower extremity weakness 12/20/2014  . Precordial pain 12/20/2014  .  Parkinson's disease ([place]) 11/28/2014  . Pain in the chest   . Long term (current) use of anticoagulants 09/22/2010  . Pulmonary embolism ([place]) 08/25/2010  . Generalized weakness 08/16/2009  . Chronic diastolic CHF (congestive heart failure) ([place]) 08/16/2009  . Venous (peripheral) insufficiency 11/15/2008  . PULMONARY EMBOLISM, HX OF 11/15/2008    History reviewed. No pertinent surgical history.   OB History   No obstetric history on file.     Family History  Problem Relation Age of Onset  . Heart disease Mother        50s    Social History   Tobacco Use  . Smoking status: Never Smoker  . Smokeless tobacco: Never Used  Substance Use Topics  . Alcohol use: No  . Drug use: No    Home Medications Prior to Admission medications   Medication Sig Start Date End Date Taking? Authorizing Provider  acetaminophen (TYLENOL) 325 MG tablet Take 2 tablets (650 mg total) by mouth every 6 (six) hours as needed for mild pain, fever or headache (or Fever >/= 101). 07/17/19   Roxan Hockey, MD  Roseanne Kaufman [place] [facility]) OINT Special Instructions: apply to buttocks q shift and PRN for abrasion Every Shift Day, Evening, Night    [provider]  NON FORMULARY Diet NAS, thin liquids    [provider]  rivaroxaban (XARELTO) 20 MG TABS tablet Take 1 tablet (20 mg total) by mouth daily with supper. 07/17/19  Shon Hale, MD    Allergies    Contrast media [iodinated diagnostic agents], Vancomycin, and Vioxx [rofecoxib]  Review of Systems   Review of Systems  Constitutional: Negative for diaphoresis and fever.  HENT: Positive for trouble swallowing. Negative for sore throat.   Eyes: Negative for visual disturbance.  Respiratory: Positive for shortness of breath. Negative for cough.   Cardiovascular: Positive for chest pain. Negative for syncope.  Gastrointestinal: Negative for abdominal pain, nausea and vomiting.  Genitourinary: Negative for dysuria.    Musculoskeletal: Positive for gait problem.  Skin: Negative for rash.  Neurological: Negative for headaches.    Physical Exam Updated Vital Signs BP 108/69   Pulse 89   Resp 15   Ht 5\' 2"  (1.575 m)   Wt 55 kg   SpO2 97%   BMI 22.18 kg/m   Physical Exam Vitals and nursing note reviewed.  Constitutional:      General: She is not in acute distress.    Appearance: She is well-developed and underweight.  HENT:     Head: Normocephalic and atraumatic.  Eyes:     Conjunctiva/sclera: Conjunctivae normal.  Cardiovascular:     Rate and Rhythm: Normal rate and regular rhythm.     Heart sounds: No murmur.  Pulmonary:     Effort: Pulmonary effort is normal. No respiratory distress.     Breath sounds: Normal breath sounds.  Abdominal:     Palpations: Abdomen is soft.     Tenderness: There is no abdominal tenderness.  Musculoskeletal:        General: No deformity.     Cervical back: Neck supple.     Right lower leg: Edema present.     Left lower leg: Edema present.  Skin:    General: Skin is warm and dry.     Capillary Refill: Capillary refill takes less than 2 seconds.  Neurological:     Mental Status: She is alert.     Comments: She is awake and alert.  She is bedbound and her neck is weak causing it to lay on her chest.  She has contractures of upper and lower extremities.  She is able to move her arms but has parkinsonian tremor to them     ED Results / Procedures / Treatments   Labs (all labs ordered are listed, but only abnormal results are displayed) Labs Reviewed  BASIC METABOLIC PANEL  CBC  BRAIN NATRIURETIC PEPTIDE  TROPONIN I (HIGH SENSITIVITY)  TROPONIN I (HIGH SENSITIVITY)    EKG EKG Interpretation  Date/Time:  Sunday September 24 2019 16:50:11 EDT Ventricular Rate:  94 PR Interval:    QRS Duration: 130 QT Interval:  394 QTC Calculation: 493 R Axis:   -59 Text Interpretation: Atrial flutter with predominant 3:1 AV block RBBB and LAFB Left ventricular  hypertrophy Artifact in lead(s) I II III aVR aVL aVF Poor data quality in current ECG precludes serial comparison Confirmed by [id number] [phone]) on 09/24/2019 5:02:36 PM   Radiology DG Chest Port 1 View  Result Date: 09/24/2019 CLINICAL DATA:  Chest pain EXAM: PORTABLE CHEST 1 VIEW COMPARISON:  Chest x-ray dated 06/17/2019. FINDINGS: RIGHT upper lobe is obscured by patient's head. Visualized lungs are clear. No pleural effusion is seen. Heart size and mediastinal contours appear stable. Visualized osseous structures are unremarkable. IMPRESSION: No active disease, with study limitations detailed above. No evidence of pneumonia or pulmonary edema. Electronically Signed   By: 06/19/2019 M.D.   On: 09/24/2019 17:51  Procedures Procedures (including critical care time)  Medications Ordered in ED Medications - No data to display  ED Course  I have reviewed the triage vital signs and the nursing notes.  Pertinent labs & imaging results that were available during my care of the patient were reviewed by me and considered in my medical decision making (see chart for details).  Clinical Course as of Sep 25 846  Wynelle Link Sep 24, 2019  2042 Her work-up including chest x-ray BMP and delta troponin has been unremarkable.  She is now complaining of some soreness on her left buttock.  Looks like there is a healing pressure sore that has not broken the skin.  Placed a dressing over this.  Will discharge back to facility.   [MB]    Clinical Course User Index [MB] Terrilee Files, MD   MDM Rules/Calculators/A&P                     This patient complains of chest pain and shortness of breath; this involves an extensive number of treatment Options and is a complaint that carries with it a high risk of complications and Morbidity. The differential includes ACS, pneumonia, pneumothorax, vascular, PE, reflux, CHF  I ordered, reviewed and interpreted labs, which included normal hemoglobin normal white  count normal chemistries normal renal function normal BNP making CHF less likely normal troponin and repeated troponin  I ordered imaging studies which included chest x-ray and I independently    visualized and interpreted imaging which showed limited by patient positioning but no gross infiltrate or pneumothorax  Previous records obtained and reviewed prior visits in epic   After the interventions stated above, I reevaluated the patient and found the patient to be resting comfortably and improved in her breathing and shortness of breath  Final Clinical Impression(s) / ED Diagnoses Final diagnoses:  Nonspecific chest pain    Rx / DC Orders ED Discharge Orders    None       Terrilee Files, MD 09/25/19 [phone]

## 2019-09-24 NOTE — ED Notes (Signed)
pts family updated 

## 2019-09-24 NOTE — ED Triage Notes (Signed)
Patient arrived to unit from [facility] with chest pain that began at 4pm today.  Per EMS vital signs within normal limits, EKG sinus rhythm.

## 2019-09-24 NOTE — Discharge Instructions (Addendum)
You were seen in the emergency department for evaluation of chest pain and shortness of breath.  Your chest x-ray blood work and EKG did not show any acute findings.  You were complaining also of some pain in your left buttocks and had a pressure dressing applied there.  Please have your care team reevaluate this area and appropriately padded in position you.  Follow-up with your doctor.  Return to the emergency department if any worsening symptoms.

## 2019-09-30 IMAGING — CR DG CHEST 1V PORT
1 series · 1 of 1 positions shown · non-contrast
Comparison: Chest radiograph dated 10/28/2016

CLINICAL DATA: 82-year-old female with hypoxia.

EXAM:
PORTABLE CHEST 1 VIEW

[portable]
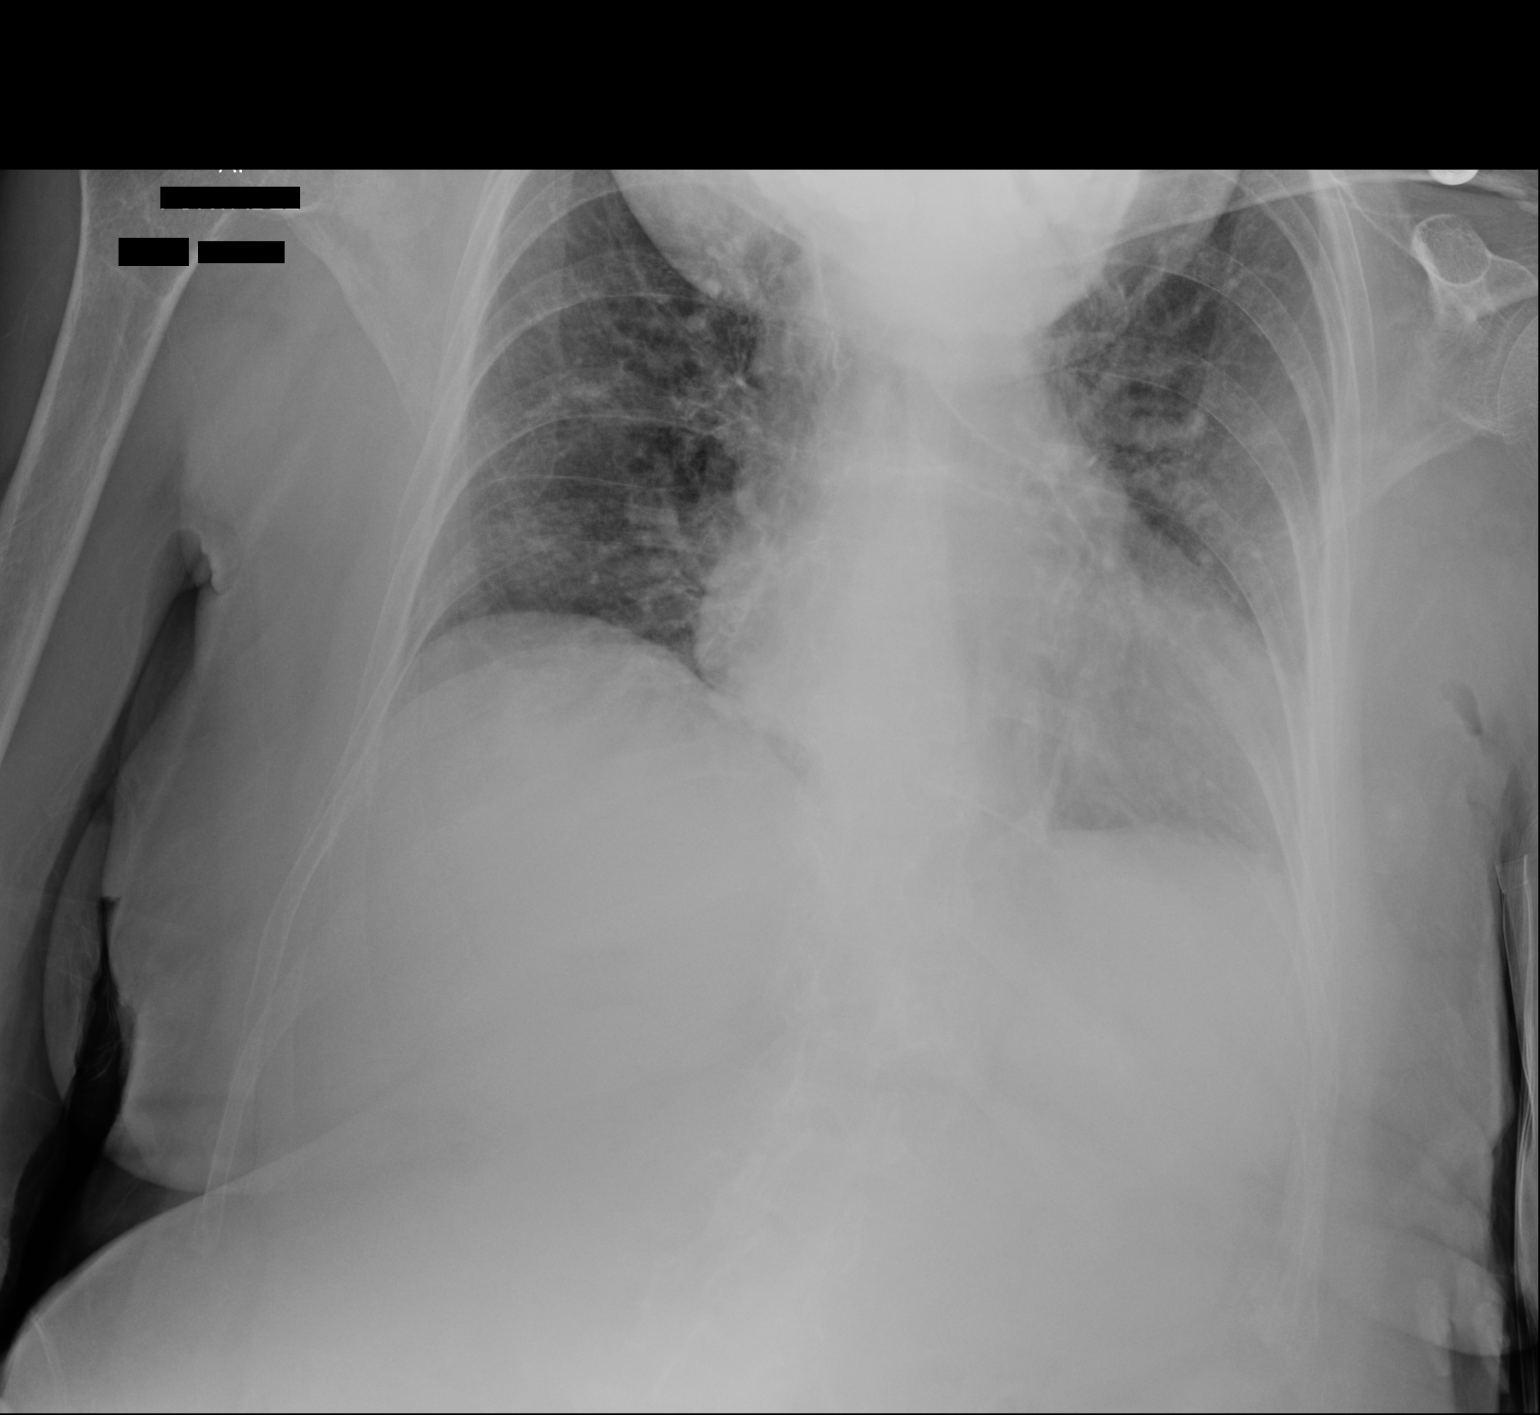

[1 of 1 positions shown; findings below may reference images not displayed]

FINDINGS: Evaluation is limited due to patient's positioning and
superimposition of the mandible over the lung apices. Mild diffuse
interstitial coarsening may represent atelectatic changes. Mild
edema is less likely but not excluded. Clinical correlation is
recommended. There is no focal consolidation, pleural effusion, or
pneumothorax. Minimal left lung base atelectatic changes/scarring.
Stable cardiac silhouette. Osteopenia with degenerative changes of
the spine. No acute osseous pathology.
IMPRESSION: No focal consolidation.

## 2019-10-04 ENCOUNTER — Non-Acute Institutional Stay (SKILLED_NURSING_FACILITY): Payer: Medicare Other | Admitting: Internal Medicine

## 2019-10-04 ENCOUNTER — Encounter: Payer: Self-pay | Admitting: Internal Medicine

## 2019-10-04 DIAGNOSIS — I5032 Chronic diastolic (congestive) heart failure: Secondary | ICD-10-CM

## 2019-10-04 DIAGNOSIS — G20 Parkinson's disease: Secondary | ICD-10-CM | POA: Diagnosis not present

## 2019-10-04 DIAGNOSIS — I2782 Chronic pulmonary embolism: Secondary | ICD-10-CM | POA: Diagnosis not present

## 2019-10-04 NOTE — Progress Notes (Signed)
Location:  [facility] Room Number: 102-P Place of Service:  SNF (31)  Margo Aye Kathleene Hazel, MD  Patient Care Team: [name], MD as PCP - General (Internal Medicine) Anselm Lis, NP as Nurse Practitioner [facility] and Palliative Medicine)  Extended Emergency Contact Information Primary Emergency Contact: Gifford Shave [place] Home Phone: [phone] Relation: Sister Secondary Emergency Contact: Asencion Islam Home Phone: [phone] Relation: Sister    Allergies: Contrast media [iodinated diagnostic agents], Vancomycin, and Vioxx [rofecoxib]  Chief Complaint  Patient presents with  . Medical Management of Chronic Issues    Routine [facility] visit  . Immunizations    Needs tetanus and Prevnar 13.  Marland Kitchen Best Practice Recommendations    Needs DEXA    HPI: Patient is an 85 y.o. female who is being seen for routine issues of chronic PE, chronic diastolic congestive heart failure, and Parkinson's disease.  Past Medical History:  Diagnosis Date  . CHF (congestive heart failure) (HCC)   . History of pulmonary embolism   . Parkinson's disease (HCC)   . Venous stasis     History reviewed. No pertinent surgical history.  Allergies as of 10/04/2019      Reactions   Contrast Media [iodinated Diagnostic Agents] Anaphylaxis, Shortness Of Breath   Vancomycin Shortness Of Breath   Vioxx [rofecoxib] Other (See Comments)   REACTION: irregular heartbeat      Medication List    Notice   This visit is during an admission. Changes to the med list made in this visit will be reflected in the After Visit Summary of the admission.    Current Outpatient Medications on File Prior to Visit  Medication Sig Dispense Refill  . acetaminophen (TYLENOL) 325 MG tablet Take 2 tablets (650 mg total) by mouth every 6 (six) hours as needed for mild pain, fever or headache (or Fever >/= 101). 12 tablet 0  . Balsam [place]-Castor Oil [facility])  OINT Special Instructions: apply to buttocks q shift and PRN for abrasion Every Shift Day, Evening, Night    . NON FORMULARY Diet NAS, thin liquids    . nystatin cream (MYCOSTATIN) Apply 1 application topically 2 (two) times daily. Apply under breasts    . Pramox-PE-Glycerin-Petrolatum (HEMORRHOIDAL EX) Apply 1 application topically 2 (two) times daily as needed (Hemorrhoids).    . rivaroxaban (XARELTO) 20 MG TABS tablet Take 1 tablet (20 mg total) by mouth daily with supper. 30 tablet 1   No current facility-administered medications on file prior to visit.     No orders of the defined types were placed in this encounter.   Immunization History  Administered Date(s) Administered  . Pneumococcal Polysaccharide-23 06/23/2007    Social History   Tobacco Use  . Smoking status: Never Smoker  . Smokeless tobacco: Never Used  Substance Use Topics  . Alcohol use: No    Review of Systems  GENERAL:  no fevers, fatigue, appetite changes SKIN: No itching, rash HEENT: No complaint RESPIRATORY: No cough, wheezing, SOB CARDIAC: No chest pain, palpitations, lower extremity edema  GI: No abdominal pain, No N/V/D or constipation, No heartburn or reflux  GU: No dysuria, frequency or urgency, or incontinence  MUSCULOSKELETAL: No unrelieved bone/joint pain NEUROLOGIC: No headache, dizziness  PSYCHIATRIC: No overt anxiety or sadness  Vitals:   10/04/19 1606  BP: 123/66  Pulse: 74  Resp: 20  Temp: 98 F (36.7 C)   Body mass index is 22.94 kg/m. Physical Exam  GENERAL  APPEARANCE: Alert, conversant, No acute distress  SKIN: No diaphoresis rash HEENT: Unremarkable RESPIRATORY: Breathing is even, unlabored. Lung sounds are clear   CARDIOVASCULAR: Heart RRR no murmurs, rubs or gallops. No peripheral edema  GASTROINTESTINAL: Abdomen is soft, non-tender, not distended w/ normal bowel sounds.  GENITOURINARY: Bladder non tender, not distended  MUSCULOSKELETAL: Dropped head  syndrome NEUROLOGIC: Cranial nerves 2-12 grossly intact. Moves all extremities with right upper extremity ataxia  PSYCHIATRIC: Mood and affect appropriate to situation, no behavioral issues  Patient Active Problem List   Diagnosis Date Noted  . Protein-calorie malnutrition (HCC) 08/02/2019  . COVID-19 virus infection 07/23/2019  . Ambulatory dysfunction 07/13/2019  . Goals of care, counseling/discussion   . Palliative care by specialist   . DNR (do not resuscitate) discussion   . Supratherapeutic INR 02/24/2018  . Physical debility 02/24/2018  . Streptococcal bacteremia 07/09/2017  . Weakness 12/21/2014  . Leg weakness 12/20/2014  . Lower extremity weakness 12/20/2014  . Precordial pain 12/20/2014  . Parkinson's disease (HCC) 11/28/2014  . Pain in the chest   . Long term (current) use of anticoagulants 09/22/2010  . Pulmonary embolism (HCC) 08/25/2010  . Generalized weakness 08/16/2009  . Chronic diastolic CHF (congestive heart failure) (HCC) 08/16/2009  . Venous (peripheral) insufficiency 11/15/2008  . PULMONARY EMBOLISM, HX OF 11/15/2008    CMP     Component Value Date/Time   NA 139 09/24/2019 1737   K 3.6 09/24/2019 1737   CL 105 09/24/2019 1737   CO2 26 09/24/2019 1737   GLUCOSE 93 09/24/2019 1737   BUN 18 09/24/2019 1737   CREATININE 0.54 09/24/2019 1737   CALCIUM 9.2 09/24/2019 1737   PROT 7.2 01/25/2019 1100   ALBUMIN 4.1 01/25/2019 1100   AST 21 01/25/2019 1100   ALT 16 01/25/2019 1100   ALKPHOS 69 01/25/2019 1100   BILITOT 0.9 01/25/2019 1100   GFRNONAA >60 09/24/2019 1737   GFRAA >60 09/24/2019 1737   Recent Labs    07/17/19 0603 07/19/19 0648 09/24/19 1737  NA 141 137 139  K 3.8 4.4 3.6  CL 109 106 105  CO2 26 23 26   GLUCOSE 92 79 93  BUN 12 19 18   CREATININE 0.47 0.58 0.54  CALCIUM 8.4* 8.6* 9.2   Recent Labs    01/25/19 1100  AST 21  ALT 16  ALKPHOS 69  BILITOT 0.9  PROT 7.2  ALBUMIN 4.1   Recent Labs    05/12/19 2118  05/12/19 2118 06/17/19 1151 06/17/19 1151 07/13/19 1011 07/19/19 0648 09/24/19 1737  WBC 6.5   < > 6.3   < > 8.9 6.0 6.5  NEUTROABS 3.8  --  4.5  --   --   --   --   HGB 13.6   < > 14.2   < > 13.4 12.6 14.0  HCT 42.7   < > 45.3   < > 42.7 41.0 44.3  MCV 88.8   < > 89.7   < > 89.5 90.7 89.7  PLT 208   < > 210   < > 203 216 213   < > = values in this interval not displayed.   No results for input(s): CHOL, LDLCALC, TRIG in the last 8760 hours.  Invalid input(s): HCL No results found for: MICROALBUR Lab Results  Component Value Date   TSH 1.814 12/20/2014   No results found for: HGBA1C No results found for: CHOL, HDL, LDLCALC, LDLDIRECT, TRIG, CHOLHDL  Significant Diagnostic Results in last 30 days:  DG  Chest Port 1 View  Result Date: 09/24/2019 CLINICAL DATA:  Chest pain EXAM: PORTABLE CHEST 1 VIEW COMPARISON:  Chest x-ray dated 06/17/2019. FINDINGS: RIGHT upper lobe is obscured by patient's head. Visualized lungs are clear. No pleural effusion is seen. Heart size and mediastinal contours appear stable. Visualized osseous structures are unremarkable. IMPRESSION: No active disease, with study limitations detailed above. No evidence of pneumonia or pulmonary edema. Electronically Signed   By: Franki Cabot M.D.   On: 09/24/2019 17:51    Assessment and Plan  Pulmonary embolism (HCC) Chronic and stable; continue Xarelto 20 mg daily  Chronic diastolic CHF (congestive heart failure) ([place]) Compensated on no medications; patient is comfort care  Parkinson's disease [facility]) Recently controlled on no medications; patient is comfort care; continue supportive care     Hennie Duos, MD

## 2019-10-14 ENCOUNTER — Encounter: Payer: Self-pay | Admitting: Internal Medicine

## 2019-10-14 NOTE — Assessment & Plan Note (Signed)
Chronic and stable; continue Xarelto 20 mg daily 

## 2019-10-14 NOTE — Assessment & Plan Note (Signed)
Recently controlled on no medications; patient is comfort care; continue supportive care

## 2019-10-14 NOTE — Assessment & Plan Note (Signed)
Compensated on no medications; patient is comfort care

## 2019-11-02 ENCOUNTER — Encounter: Payer: Self-pay | Admitting: Adult Health

## 2019-11-02 ENCOUNTER — Non-Acute Institutional Stay (SKILLED_NURSING_FACILITY): Payer: Medicare Other | Admitting: Adult Health

## 2019-11-02 DIAGNOSIS — G20 Parkinson's disease: Secondary | ICD-10-CM

## 2019-11-02 DIAGNOSIS — I2782 Chronic pulmonary embolism: Secondary | ICD-10-CM

## 2019-11-02 DIAGNOSIS — I5032 Chronic diastolic (congestive) heart failure: Secondary | ICD-10-CM | POA: Diagnosis not present

## 2019-11-02 NOTE — Progress Notes (Signed)
Location:    [facility] Room Number: 102/P Place of Service:  SNF (31)   CODE STATUS: DNR  Allergies  Allergen Reactions  . Contrast Media [Iodinated Diagnostic Agents] Anaphylaxis and Shortness Of Breath  . Vancomycin Shortness Of Breath  . Vioxx [Rofecoxib] Other (See Comments)    REACTION: irregular heartbeat    Chief Complaint  Patient presents with  . Acute Visit    Care Plan Meeting    HPI:  We have come together for her care plan meeting. Unable to do BIMS: mood 5/30. No reports of falls. She has gained weight; is up to 131 pounds. She is total care with adls; and is incontinent for bladder and bowel. There are no reports of uncontrolled pain. She does yell out frequently. She continues to be followed for her chronic illnesses including: pulmonary embolism; chf; parkinson's disease   Past Medical History:  Diagnosis Date  . CHF (congestive heart failure) (HCC)   . History of pulmonary embolism   . Parkinson's disease (HCC)   . Venous stasis     No past surgical history on file.  Social History   Socioeconomic History  . Marital status: Single    Spouse name: Not on file  . Number of children: Not on file  . Years of education: Not on file  . Highest education level: Not on file  Occupational History  . Not on file  Tobacco Use  . Smoking status: Never Smoker  . Smokeless tobacco: Never Used  Substance and Sexual Activity  . Alcohol use: No  . Drug use: No  . Sexual activity: Not on file  Other Topics Concern  . Not on file  Social History Narrative  . Not on file   Social Determinants of Health   Financial Resource Strain:   . Difficulty of Paying Living Expenses:   Food Insecurity:   . Worried About Programme researcher, broadcasting/film/video in the Last Year:   . Barista in the Last Year:   Transportation Needs:   . Freight forwarder (Medical):   Marland Kitchen Lack of Transportation (Non-Medical):   Physical Activity:   . Days of Exercise  per Week:   . Minutes of Exercise per Session:   Stress:   . Feeling of Stress :   Social Connections:   . Frequency of Communication with Friends and Family:   . Frequency of Social Gatherings with Friends and Family:   . Attends Religious Services:   . Active Member of Clubs or Organizations:   . Attends Banker Meetings:   Marland Kitchen Marital Status:   Intimate Partner Violence:   . Fear of Current or Ex-Partner:   . Emotionally Abused:   Marland Kitchen Physically Abused:   . Sexually Abused:    Family History  Problem Relation Age of Onset  . Heart disease Mother        40s      VITAL SIGNS BP 112/78   Pulse 68   Temp (!) 95.1 F (35.1 C) (Oral)   Resp 20   Ht 5\' 2"  (1.575 m)   Wt 131 lb (59.4 kg)   BMI 23.96 kg/m   Outpatient Encounter Medications as of 11/02/2019  Medication Sig  . acetaminophen (TYLENOL) 325 MG tablet Take 2 tablets (650 mg total) by mouth every 6 (six) hours as needed for mild pain, fever or headache (or Fever >/= 101).  11/04/2019 [place]-Castor Oil [facility]) OINT Special Instructions: apply to  buttocks q shift and PRN for abrasion Every Shift Day, Evening, Night  . NON FORMULARY Diet NAS, thin liquids  . nystatin cream (MYCOSTATIN) Apply 1 application topically 2 (two) times daily. Apply under breasts  . Pramox-PE-Glycerin-Petrolatum (HEMORRHOIDAL EX) Apply 1 application topically 2 (two) times daily as needed (Hemorrhoids).  . rivaroxaban (XARELTO) 20 MG TABS tablet Take 1 tablet (20 mg total) by mouth daily with supper.   No facility-administered encounter medications on file as of 11/02/2019.     SIGNIFICANT DIAGNOSTIC EXAMS  LABS REVIEWED PREVIOUS    07-13-19: wbc 8.9; hgb 13.4; hct 42.7; mcv 89.5 plt 203; glucose 115; bun 14; creat 0.49; k+ 3.7; na++ 140; ca 8.5 urine culure no growth 07-19-19; wbc 6.0; hgb 12.6; hct 41.0. mcv 90.7 plt 216; glucose 79; bun 19; creat 0.58; k+ 4.4 na++ 137; ca 8.6 08-18-19 urine culture: <10,000   TODAY  09-11-19:  urine culture: multiple species 09-24-19: wbc 6.5; hgb 14.0; hct 44.3; mcv 89.7 plt 213; glucose 93; bun 18; creat 0.54; k+ 3.6; na++ 139; ca 9.2  Review of Systems  Constitutional: Negative for malaise/fatigue.  Respiratory: Negative for cough and shortness of breath.   Cardiovascular: Negative for chest pain, palpitations and leg swelling.  Gastrointestinal: Negative for abdominal pain, constipation and heartburn.  Musculoskeletal: Negative for back pain, joint pain and myalgias.  Skin: Negative.   Neurological: Negative for dizziness.  Psychiatric/Behavioral: The patient is not nervous/anxious.     Physical Exam Constitutional:      General: She is not in acute distress.    Appearance: She is well-developed. She is not diaphoretic.  Neck:     Thyroid: No thyromegaly.  Cardiovascular:     Rate and Rhythm: Normal rate and regular rhythm.     Pulses: Normal pulses.     Heart sounds: Normal heart sounds.  Pulmonary:     Effort: Pulmonary effort is normal. No respiratory distress.     Breath sounds: Normal breath sounds.  Abdominal:     General: Bowel sounds are normal. There is no distension.     Palpations: Abdomen is soft.     Tenderness: There is no abdominal tenderness.  Musculoskeletal:     Cervical back: Neck supple.     Right lower leg: No edema.     Left lower leg: No edema.     Comments: Neck is weak has contracture  Is able to move all extremities right extremity with ataxic movements      Lymphadenopathy:     Cervical: No cervical adenopathy.  Skin:    General: Skin is warm and dry.     Comments: Bruise to forehead due to having staff hold up her head.    Neurological:     Mental Status: She is alert and oriented to person, place, and time.  Psychiatric:        Mood and Affect: Mood normal.      ASSESSMENT/ PLAN:  TODAY  1. Chronic diastolic CHF (congestive heart failure)  2. Parkinson's disease 3. Chronic pulmonary embolism without acute cor pulmonale  unspecified embolism type  Will continue current medications Will continue current plan of care Will continue to monitor her status.     MD is aware of resident's narcotic use and is in agreement with current plan of care. We will attempt to wean resident as appropriate.  Ok Edwards NP [facility]  Contact [phone] Monday through Friday 8am- 5pm  After hours call [phone]

## 2019-11-07 ENCOUNTER — Encounter: Payer: Self-pay | Admitting: Adult Health

## 2019-11-07 ENCOUNTER — Non-Acute Institutional Stay (SKILLED_NURSING_FACILITY): Payer: Medicare Other | Admitting: Adult Health

## 2019-11-07 DIAGNOSIS — E440 Moderate protein-calorie malnutrition: Secondary | ICD-10-CM | POA: Diagnosis not present

## 2019-11-07 DIAGNOSIS — I2782 Chronic pulmonary embolism: Secondary | ICD-10-CM | POA: Diagnosis not present

## 2019-11-07 DIAGNOSIS — I5032 Chronic diastolic (congestive) heart failure: Secondary | ICD-10-CM

## 2019-11-07 NOTE — Progress Notes (Signed)
Location:    [facility] Room Number: 102/P Place of Service:  SNF (31)   CODE STATUS: DNR  Allergies  Allergen Reactions  . Contrast Media [Iodinated Diagnostic Agents] Anaphylaxis and Shortness Of Breath  . Vancomycin Shortness Of Breath  . Vioxx [Rofecoxib] Other (See Comments)    REACTION: irregular heartbeat    Chief Complaint  Patient presents with  . Medical Management of Chronic Issues          Chronic diastolic CHF (congestive heart failure)   Chronic pulmonary embolism without acte cor pulmonale unspecified pulmonary embolism type:   Moderate protein calorie malnutrition    HPI:  She is a 85 year old long term resident of this facility being seen for the management of her chronic illnesses: diastolic heart failure; chronic pulm embolism; malnutrition. There are no reports of uncontrolled pain; weight is stable. She does yell out at times.   Past Medical History:  Diagnosis Date  . CHF (congestive heart failure) (HCC)   . History of pulmonary embolism   . Parkinson's disease (HCC)   . Venous stasis     No past surgical history on file.  Social History   Socioeconomic History  . Marital status: Single    Spouse name: Not on file  . Number of children: Not on file  . Years of education: Not on file  . Highest education level: Not on file  Occupational History  . Not on file  Tobacco Use  . Smoking status: Never Smoker  . Smokeless tobacco: Never Used  Substance and Sexual Activity  . Alcohol use: No  . Drug use: No  . Sexual activity: Not on file  Other Topics Concern  . Not on file  Social History Narrative  . Not on file   Social Determinants of Health   Financial Resource Strain:   . Difficulty of Paying Living Expenses:   Food Insecurity:   . Worried About Programme researcher, broadcasting/film/video in the Last Year:   . Barista in the Last Year:   Transportation Needs:   . Freight forwarder (Medical):   Marland Kitchen Lack of Transportation  (Non-Medical):   Physical Activity:   . Days of Exercise per Week:   . Minutes of Exercise per Session:   Stress:   . Feeling of Stress :   Social Connections:   . Frequency of Communication with Friends and Family:   . Frequency of Social Gatherings with Friends and Family:   . Attends Religious Services:   . Active Member of Clubs or Organizations:   . Attends Banker Meetings:   Marland Kitchen Marital Status:   Intimate Partner Violence:   . Fear of Current or Ex-Partner:   . Emotionally Abused:   Marland Kitchen Physically Abused:   . Sexually Abused:    Family History  Problem Relation Age of Onset  . Heart disease Mother        68s      VITAL SIGNS BP 132/71   Pulse 69   Temp 97.8 F (36.6 C) (Oral)   Resp 20   Ht 5\' 2"  (1.575 m)   Wt 131 lb (59.4 kg)   BMI 23.96 kg/m   Outpatient Encounter Medications as of 11/07/2019  Medication Sig  . acetaminophen (TYLENOL) 325 MG tablet Take 2 tablets (650 mg total) by mouth every 6 (six) hours as needed for mild pain, fever or headache (or Fever >/= 101).  11/09/2019 [place]-Castor Oil (  [employer]) OINT Special Instructions: apply to buttocks q shift and PRN for abrasion Every Shift Day, Evening, Night  . NON FORMULARY Diet NAS, thin liquids  . nystatin cream (MYCOSTATIN) Apply 1 application topically 2 (two) times daily. Apply under breasts  . Pramox-PE-Glycerin-Petrolatum (HEMORRHOIDAL EX) Apply 1 application topically 2 (two) times daily as needed (Hemorrhoids).  . rivaroxaban (XARELTO) 20 MG TABS tablet Take 1 tablet (20 mg total) by mouth daily with supper.   No facility-administered encounter medications on file as of 11/07/2019.     SIGNIFICANT DIAGNOSTIC EXAMS   LABS REVIEWED PREVIOUS    07-13-19: wbc 8.9; hgb 13.4; hct 42.7; mcv 89.5 plt 203; glucose 115; bun 14; creat 0.49; k+ 3.7; na++ 140; ca 8.5 urine culure no growth 07-19-19; wbc 6.0; hgb 12.6; hct 41.0. mcv 90.7 plt 216; glucose 79; bun 19; creat 0.58; k+ 4.4 na++ 137; ca  8.6 08-18-19 urine culture: <10,000   TODAY  09-11-19: urine culture: multiple species 09-24-19: wbc 6.5; hgb 14.0; hct 44.3; mcv 89. 7plt 213; glucose 93; bun 18; creat 0.54; k+ 3.6; na++ 139; ca 9.2    Review of Systems  Constitutional: Negative for malaise/fatigue.  Respiratory: Negative for cough and shortness of breath.   Cardiovascular: Negative for chest pain, palpitations and leg swelling.  Gastrointestinal: Negative for abdominal pain, constipation and heartburn.  Musculoskeletal: Negative for back pain, joint pain and myalgias.  Skin: Negative.   Neurological: Negative for dizziness.  Psychiatric/Behavioral: The patient is not nervous/anxious.     Physical Exam Constitutional:      General: She is not in acute distress.    Appearance: She is well-developed. She is not diaphoretic.  Neck:     Thyroid: No thyromegaly.  Cardiovascular:     Rate and Rhythm: Normal rate and regular rhythm.     Pulses: Normal pulses.     Heart sounds: Normal heart sounds.  Pulmonary:     Effort: Pulmonary effort is normal. No respiratory distress.     Breath sounds: Normal breath sounds.  Abdominal:     General: Bowel sounds are normal. There is no distension.     Palpations: Abdomen is soft.     Tenderness: There is no abdominal tenderness.  Musculoskeletal:     Cervical back: Neck supple.     Right lower leg: No edema.     Left lower leg: No edema.     Comments:  Neck is weak has contracture  Is able to move all extremities right extremity with ataxic movements     Lymphadenopathy:     Cervical: No cervical adenopathy.  Skin:    General: Skin is warm and dry.  Neurological:     Mental Status: She is alert and oriented to person, place, and time.  Psychiatric:        Mood and Affect: Mood normal.        ASSESSMENT/ PLAN:  TODAY;   1. Chronic diastolic CHF (congestive heart failure) is compensated; will monitor  2. Chronic pulmonary embolism without acte cor pulmonale  unspecified pulmonary embolism type: is stable is on long term xarelto 20 mg daily will decline medication at times.   3. Moderate protein calorie malnutrition: weight is stable at 131 pounds; will continue supplements as directed.   PREVIOUS  4. Physical debility: no change in status will monitor   5. Parkinson's disease no change in status will monitor      MD is aware of resident's narcotic use and is in agreement with  current plan of care. We will attempt to wean resident as appropriate.  Ok Edwards NP [facility]  Contact [phone] Monday through Friday 8am- 5pm  After hours call [phone]

## 2019-12-04 ENCOUNTER — Non-Acute Institutional Stay (SKILLED_NURSING_FACILITY): Payer: Medicare Other | Admitting: Adult Health

## 2019-12-04 ENCOUNTER — Encounter: Payer: Self-pay | Admitting: Adult Health

## 2019-12-04 DIAGNOSIS — E440 Moderate protein-calorie malnutrition: Secondary | ICD-10-CM | POA: Diagnosis not present

## 2019-12-04 DIAGNOSIS — I2782 Chronic pulmonary embolism: Secondary | ICD-10-CM | POA: Diagnosis not present

## 2019-12-04 DIAGNOSIS — I5032 Chronic diastolic (congestive) heart failure: Secondary | ICD-10-CM | POA: Diagnosis not present

## 2019-12-04 NOTE — Progress Notes (Signed)
Location:    [facility] Room Number: 102/P Place of Service:  SNF (31)   CODE STATUS: DNR  Allergies  Allergen Reactions   Contrast Media [Iodinated Diagnostic Agents] Anaphylaxis and Shortness Of Breath   Vancomycin Shortness Of Breath   Vioxx [Rofecoxib] Other (See Comments)    REACTION: irregular heartbeat    Chief Complaint  Patient presents with   Medical Management of Chronic Issues         Chronic diastolic CHF (congestive heart failure)   Chronic pulmonary embolism without acute cor pulmonale unspecified pulmonary embolism type:  Moderate protein calorie malnutrition:     HPI:  She is a 85 year old long term resident of this facility being seen for the management of her chronic illnesses: chf; pe; malnutrition. There are no reports of uncontrolled pain.her appetite is stable. Her weight is stable. She will on rare occasions yell out .   Past Medical History:  Diagnosis Date   CHF (congestive heart failure) (HCC)    History of pulmonary embolism    Parkinson's disease (HCC)    Venous stasis     No past surgical history on file.  Social History   Socioeconomic History   Marital status: Single    Spouse name: Not on file   Number of children: Not on file   Years of education: Not on file   Highest education level: Not on file  Occupational History   Not on file  Tobacco Use   Smoking status: Never Smoker   Smokeless tobacco: Never Used  Substance and Sexual Activity   Alcohol use: No   Drug use: No   Sexual activity: Not on file  Other Topics Concern   Not on file  Social History Narrative   Not on file   Social Determinants of Health   Financial Resource Strain:    Difficulty of Paying Living Expenses:   Food Insecurity:    Worried About Running Out of Food in the Last Year:    Barista in the Last Year:   Transportation Needs:    Freight forwarder (Medical):    Lack of Transportation  (Non-Medical):   Physical Activity:    Days of Exercise per Week:    Minutes of Exercise per Session:   Stress:    Feeling of Stress :   Social Connections:    Frequency of Communication with Friends and Family:    Frequency of Social Gatherings with Friends and Family:    Attends Religious Services:    Active Member of Clubs or Organizations:    Attends Engineer, structural:    Marital Status:   Intimate Partner Violence:    Fear of Current or Ex-Partner:    Emotionally Abused:    Physically Abused:    Sexually Abused:    Family History  Problem Relation Age of Onset   Heart disease Mother        90s      VITAL SIGNS BP 125/67    Pulse 72    Temp (!) 97.3 F (36.3 C) (Oral)    Resp 20    Ht 5\' 2"  (1.575 m)    Wt 130 lb 6.4 oz (59.1 kg)    BMI 23.85 kg/m   Outpatient Encounter Medications as of 12/04/2019  Medication Sig   acetaminophen (TYLENOL) 325 MG tablet Take 2 tablets (650 mg total) by mouth every 6 (six) hours as needed for mild pain, fever or  headache (or Fever >/= 101).   NON FORMULARY Diet NAS, thin liquids   Pramox-PE-Glycerin-Petrolatum (HEMORRHOIDAL EX) Apply 1 application topically 2 (two) times daily as needed (Hemorrhoids).   rivaroxaban (XARELTO) 20 MG TABS tablet Take 1 tablet (20 mg total) by mouth daily with supper.   [DISCONTINUED] Balsam [place]-Castor Oil [facility]) OINT Special Instructions: apply to buttocks q shift and PRN for abrasion Every Shift Day, Evening, Night   [DISCONTINUED] nystatin cream (MYCOSTATIN) Apply 1 application topically 2 (two) times daily. Apply under breasts   No facility-administered encounter medications on file as of 12/04/2019.     SIGNIFICANT DIAGNOSTIC EXAMS   LABS REVIEWED PREVIOUS    07-13-19: wbc 8.9; hgb 13.4; hct 42.7; mcv 89.5 plt 203; glucose 115; bun 14; creat 0.49; k+ 3.7; na++ 140; ca 8.5 urine culure no growth 07-19-19; wbc 6.0; hgb 12.6; hct 41.0. mcv 90.7 plt 216; glucose 79;  bun 19; creat 0.58; k+ 4.4 na++ 137; ca 8.6 08-18-19 urine culture: <10,000  09-11-19: urine culture: multiple species 09-24-19: wbc 6.5; hgb 14.0; hct 44.3; mcv 89. 7plt 213; glucose 93; bun 18; creat 0.54; k+ 3.6; na++ 139; ca 9.2  NO NEW LABS.    Review of Systems  Constitutional: Negative for malaise/fatigue.  Respiratory: Negative for cough and shortness of breath.   Cardiovascular: Negative for chest pain, palpitations and leg swelling.  Gastrointestinal: Negative for abdominal pain, constipation and heartburn.  Musculoskeletal: Negative for back pain, joint pain and myalgias.  Skin: Negative.   Neurological: Negative for dizziness.  Psychiatric/Behavioral: The patient is not nervous/anxious.    Physical Exam Constitutional:      General: She is not in acute distress.    Appearance: She is well-developed. She is not diaphoretic.  Neck:     Thyroid: No thyromegaly.  Cardiovascular:     Rate and Rhythm: Normal rate and regular rhythm.     Pulses: Normal pulses.     Heart sounds: Normal heart sounds.  Pulmonary:     Effort: Pulmonary effort is normal. No respiratory distress.     Breath sounds: Normal breath sounds.  Abdominal:     General: Bowel sounds are normal. There is no distension.     Palpations: Abdomen is soft.     Tenderness: There is no abdominal tenderness.  Musculoskeletal:     Cervical back: Neck supple.     Right lower leg: No edema.     Left lower leg: No edema.     Comments: Neck is weak has contracture  Is able to move all extremities right extremity with ataxic movements    Lymphadenopathy:     Cervical: No cervical adenopathy.  Skin:    General: Skin is warm and dry.  Neurological:     Mental Status: She is alert and oriented to person, place, and time.  Psychiatric:        Mood and Affect: Mood normal.         ASSESSMENT/ PLAN:  TODAY;   1. Chronic diastolic CHF (congestive heart failure) is compensated; will monitor  2. Chronic  pulmonary embolism without acute cor pulmonale unspecified pulmonary embolism type: is stable is on long term xarelto 20 mg daily will decline medication at times.   3. Moderate protein calorie malnutrition: weight is stable at 130 pounds; will continue supplements as directed.   PREVIOUS  4. Physical debility: no change in status will monitor   5. Parkinson's disease no change in status will monitor  MD is aware of resident's narcotic use and is in agreement with current plan of care. We will attempt to wean resident as appropriate.  Ok Safranek NP [facility] Adult Medicine  Contact [phone] Monday through Friday 8am- 5pm  After hours call [phone]

## 2020-01-02 ENCOUNTER — Non-Acute Institutional Stay (SKILLED_NURSING_FACILITY): Payer: Medicare Other | Admitting: Adult Health

## 2020-01-02 ENCOUNTER — Encounter: Payer: Self-pay | Admitting: Adult Health

## 2020-01-02 DIAGNOSIS — G20 Parkinson's disease: Secondary | ICD-10-CM

## 2020-01-02 DIAGNOSIS — E440 Moderate protein-calorie malnutrition: Secondary | ICD-10-CM | POA: Diagnosis not present

## 2020-01-02 DIAGNOSIS — I2782 Chronic pulmonary embolism: Secondary | ICD-10-CM | POA: Diagnosis not present

## 2020-01-02 NOTE — Progress Notes (Signed)
Location:    [facility] Room Number: 102/P Place of Service:  SNF (31)   CODE STATUS: DNR  Allergies  Allergen Reactions  . Contrast Media [Iodinated Diagnostic Agents] Anaphylaxis and Shortness Of Breath  . Vancomycin Shortness Of Breath  . Vioxx [Rofecoxib] Other (See Comments)    REACTION: irregular heartbeat    Chief Complaint  Patient presents with  . Medical Management of Chronic Issues          Chronic pulmonary embolism without acute  cor pulmonale unspecified pulmonary embolism type:    Moderate protein calorie malnutrition:  Parkinson's disease:    HPI:  She is a 85 year old long term resident of this facility being seen for the management of her chronic illnesses:  Chronic pulmonary embolism without acuate or cor pulmonale unspecified pulmonary embolism type:  Moderate protein calorie malnutrition: Parkinson's disease. There are no reports of uncontrolled; no constipation. She is agitated frequently; does scream for help. She will not take any thing for any anxiety.   Past Medical History:  Diagnosis Date  . CHF (congestive heart failure) (HCC)   . History of pulmonary embolism   . Parkinson's disease (HCC)   . Venous stasis     No past surgical history on file.  Social History   Socioeconomic History  . Marital status: Single    Spouse name: Not on file  . Number of children: Not on file  . Years of education: Not on file  . Highest education level: Not on file  Occupational History  . Not on file  Tobacco Use  . Smoking status: Never Smoker  . Smokeless tobacco: Never Used  Substance and Sexual Activity  . Alcohol use: No  . Drug use: No  . Sexual activity: Not on file  Other Topics Concern  . Not on file  Social History Narrative  . Not on file   Social Determinants of Health   Financial Resource Strain:   . Difficulty of Paying Living Expenses:   Food Insecurity:   . Worried About Programme researcher, broadcasting/film/video in the Last  Year:   . Barista in the Last Year:   Transportation Needs:   . Freight forwarder (Medical):   Marland Kitchen Lack of Transportation (Non-Medical):   Physical Activity:   . Days of Exercise per Week:   . Minutes of Exercise per Session:   Stress:   . Feeling of Stress :   Social Connections:   . Frequency of Communication with Friends and Family:   . Frequency of Social Gatherings with Friends and Family:   . Attends Religious Services:   . Active Member of Clubs or Organizations:   . Attends Banker Meetings:   Marland Kitchen Marital Status:   Intimate Partner Violence:   . Fear of Current or Ex-Partner:   . Emotionally Abused:   Marland Kitchen Physically Abused:   . Sexually Abused:    Family History  Problem Relation Age of Onset  . Heart disease Mother        37s      VITAL SIGNS BP (!) 116/57   Pulse 64   Temp 97.8 F (36.6 C) (Oral)   Ht 5\' 2"  (1.575 m)   Wt 126 lb (57.2 kg)   BMI 23.05 kg/m   Outpatient Encounter Medications as of 01/02/2020  Medication Sig  . acetaminophen (TYLENOL) 325 MG tablet Take 2 tablets (650 mg total) by mouth every 6 (six) hours as  needed for mild pain, fever or headache (or Fever >/= 101).  . NON FORMULARY Diet NAS, thin liquids  . Pramox-PE-Glycerin-Petrolatum (HEMORRHOIDAL EX) Apply 1 application topically 2 (two) times daily as needed (Hemorrhoids).  . rivaroxaban (XARELTO) 20 MG TABS tablet Take 1 tablet (20 mg total) by mouth daily with supper.   No facility-administered encounter medications on file as of 01/02/2020.     SIGNIFICANT DIAGNOSTIC EXAMS   LABS REVIEWED PREVIOUS    07-13-19: wbc 8.9; hgb 13.4; hct 42.7; mcv 89.5 plt 203; glucose 115; bun 14; creat 0.49; k+ 3.7; na++ 140; ca 8.5 urine culure no growth 07-19-19; wbc 6.0; hgb 12.6; hct 41.0. mcv 90.7 plt 216; glucose 79; bun 19; creat 0.58; k+ 4.4 na++ 137; ca 8.6 08-18-19 urine culture: <10,000  09-11-19: urine culture: multiple species 09-24-19: wbc 6.5; hgb 14.0; hct 44.3;  mcv 89. 7plt 213; glucose 93; bun 18; creat 0.54; k+ 3.6; na++ 139; ca 9.2  NO NEW LABS.     Review of Systems  Constitutional: Negative for malaise/fatigue.  Respiratory: Negative for cough and shortness of breath.   Cardiovascular: Negative for chest pain, palpitations and leg swelling.  Gastrointestinal: Negative for abdominal pain, constipation and heartburn.  Musculoskeletal: Negative for back pain, joint pain and myalgias.  Skin: Negative.   Neurological: Negative for dizziness.  Psychiatric/Behavioral: The patient is not nervous/anxious.     Physical Exam Constitutional:      General: She is not in acute distress.    Appearance: She is well-developed. She is not diaphoretic.  Neck:     Thyroid: No thyromegaly.  Cardiovascular:     Rate and Rhythm: Normal rate and regular rhythm.     Pulses: Normal pulses.     Heart sounds: Normal heart sounds.  Pulmonary:     Effort: Pulmonary effort is normal. No respiratory distress.     Breath sounds: Normal breath sounds.  Abdominal:     General: Bowel sounds are normal. There is no distension.     Palpations: Abdomen is soft.     Tenderness: There is no abdominal tenderness.  Musculoskeletal:     Cervical back: Neck supple.     Right lower leg: No edema.     Left lower leg: No edema.     Comments: Neck is weak has contracture  Is able to move all extremities right extremity with ataxic movements  Lymphadenopathy:     Cervical: No cervical adenopathy.  Skin:    General: Skin is warm and dry.  Neurological:     Mental Status: She is alert. Mental status is at baseline.  Psychiatric:        Mood and Affect: Mood normal.      ASSESSMENT/ PLAN:  TODAY;   1. Chronic pulmonary embolism without acuate or cor pulmonale unspecified pulmonary embolism type: is stable is on long term xarelto 20 mg daily will decline medications at this time.   2. Moderate protein calorie malnutrition: is stable weight is 126 pounds will  monitor   3. Parkinson's disease: without change will monitor   PREVIOUS  4. Physical debility: no change in status will monitor   5. Chronic diastolic CHF (congestive heart failure) is compensated; will monitor   MD is aware of resident's narcotic use and is in agreement with current plan of care. We will attempt to wean resident as appropriate.  Synthia Innocent NP [facility] Adult Medicine  Contact [phone] Monday through Friday 8am- 5pm  After hours call [phone]

## 2020-01-25 ENCOUNTER — Non-Acute Institutional Stay (SKILLED_NURSING_FACILITY): Payer: Medicare Other | Admitting: Adult Health

## 2020-01-25 ENCOUNTER — Encounter: Payer: Self-pay | Admitting: Adult Health

## 2020-01-25 DIAGNOSIS — I2782 Chronic pulmonary embolism: Secondary | ICD-10-CM | POA: Diagnosis not present

## 2020-01-25 DIAGNOSIS — I5032 Chronic diastolic (congestive) heart failure: Secondary | ICD-10-CM

## 2020-01-25 DIAGNOSIS — G20 Parkinson's disease: Secondary | ICD-10-CM | POA: Diagnosis not present

## 2020-01-25 NOTE — Progress Notes (Signed)
Location:    [facility] Room Number: 102/P Place of Service:  SNF (31)   CODE STATUS: DNR  Allergies  Allergen Reactions   Contrast Media [Iodinated Diagnostic Agents] Anaphylaxis and Shortness Of Breath   Vancomycin Shortness Of Breath   Vioxx [Rofecoxib] Other (See Comments)    REACTION: irregular heartbeat    Chief Complaint  Patient presents with   Acute Visit    Care Plan Meeting    HPI:  We have come together for her care plan meeting. Family present. No falls. Refused BIMS; mood 4/30. She has lost weight is now down to 121 pounds. She will decline her medications and will decline to eat at times. She is total care with adls; requires assistance with meals; is incontinent of bladder and bowel. There are no reports of uncontrolled pain. She continues to have periods of time with yelling out and resistance to care. She continues to be followed for her chronic illnesses including: parkinsons disease  Chronic diastolic chf (congestive heart failure)  Chronic pulmonary embolism without cor pulmonale unspecified pulmonary embolism type  Past Medical History:  Diagnosis Date   CHF (congestive heart failure) (HCC)    History of pulmonary embolism    Parkinson's disease (HCC)    Venous stasis     No past surgical history on file.  Social History   Socioeconomic History   Marital status: Single    Spouse name: Not on file   Number of children: Not on file   Years of education: Not on file   Highest education level: Not on file  Occupational History   Not on file  Tobacco Use   Smoking status: Never Smoker   Smokeless tobacco: Never Used  Substance and Sexual Activity   Alcohol use: No   Drug use: No   Sexual activity: Not on file  Other Topics Concern   Not on file  Social History Narrative   Not on file   Social Determinants of Health   Financial Resource Strain:    Difficulty of Paying Living Expenses:   Food  Insecurity:    Worried About Running Out of Food in the Last Year:    Barista in the Last Year:   Transportation Needs:    Freight forwarder (Medical):    Lack of Transportation (Non-Medical):   Physical Activity:    Days of Exercise per Week:    Minutes of Exercise per Session:   Stress:    Feeling of Stress :   Social Connections:    Frequency of Communication with Friends and Family:    Frequency of Social Gatherings with Friends and Family:    Attends Religious Services:    Active Member of Clubs or Organizations:    Attends Engineer, structural:    Marital Status:   Intimate Partner Violence:    Fear of Current or Ex-Partner:    Emotionally Abused:    Physically Abused:    Sexually Abused:    Family History  Problem Relation Age of Onset   Heart disease Mother        90s      VITAL SIGNS BP (!) 104/56    Pulse 71    Temp 98.4 F (36.9 C) (Oral)    Ht 5\' 2"  (1.575 m)    Wt 121 lb (54.9 kg)    BMI 22.13 kg/m   Outpatient Encounter Medications as of 01/25/2020  Medication Sig   acetaminophen (TYLENOL)  325 MG tablet Take 2 tablets (650 mg total) by mouth every 6 (six) hours as needed for mild pain, fever or headache (or Fever >/= 101).   NON FORMULARY Diet NAS, thin liquids   nystatin cream (MYCOSTATIN) Apply 1 application topically 3 (three) times daily as needed. Special Instructions:apply to rash under right side of neck   Pramox-PE-Glycerin-Petrolatum (HEMORRHOIDAL EX) Apply 1 application topically 2 (two) times daily as needed (Hemorrhoids).   rivaroxaban (XARELTO) 20 MG TABS tablet Take 1 tablet (20 mg total) by mouth daily with supper.   No facility-administered encounter medications on file as of 01/25/2020.     SIGNIFICANT DIAGNOSTIC EXAMS   LABS REVIEWED PREVIOUS    07-13-19: wbc 8.9; hgb 13.4; hct 42.7; mcv 89.5 plt 203; glucose 115; bun 14; creat 0.49; k+ 3.7; na++ 140; ca 8.5 urine culure no growth 07-19-19;  wbc 6.0; hgb 12.6; hct 41.0. mcv 90.7 plt 216; glucose 79; bun 19; creat 0.58; k+ 4.4 na++ 137; ca 8.6 08-18-19 urine culture: <10,000  09-11-19: urine culture: multiple species 09-24-19: wbc 6.5; hgb 14.0; hct 44.3; mcv 89. 7plt 213; glucose 93; bun 18; creat 0.54; k+ 3.6; na++ 139; ca 9.2  NO NEW LABS.    Review of Systems  Constitutional: Negative for malaise/fatigue.  Respiratory: Negative for cough and shortness of breath.   Cardiovascular: Negative for chest pain, palpitations and leg swelling.  Gastrointestinal: Negative for abdominal pain, constipation and heartburn.  Musculoskeletal: Negative for back pain, joint pain and myalgias.  Skin: Negative.   Neurological: Negative for dizziness.  Psychiatric/Behavioral: The patient is not nervous/anxious.     Physical Exam Constitutional:      General: She is not in acute distress.    Appearance: She is well-developed. She is not diaphoretic.  Neck:     Thyroid: No thyromegaly.  Cardiovascular:     Rate and Rhythm: Normal rate and regular rhythm.     Pulses: Normal pulses.     Heart sounds: Normal heart sounds.  Pulmonary:     Effort: Pulmonary effort is normal. No respiratory distress.     Breath sounds: Normal breath sounds.  Abdominal:     General: Bowel sounds are normal. There is no distension.     Palpations: Abdomen is soft.     Tenderness: There is no abdominal tenderness.  Musculoskeletal:     Cervical back: Neck supple.     Right lower leg: No edema.     Left lower leg: No edema.     Comments: Neck is weak has contracture  Is able to move all extremities right extremity with ataxic movements   Lymphadenopathy:     Cervical: No cervical adenopathy.  Skin:    General: Skin is warm and dry.  Neurological:     Mental Status: She is alert. Mental status is at baseline.  Psychiatric:        Mood and Affect: Mood normal.       ASSESSMENT/ PLAN:  TODAY  1. parkinsons disease 2. Chronic diastolic chf  (congestive heart failure)  3. Chronic pulmonary embolism without cor pulmonale unspecified pulmonary embolism type  Will continue current medications Will continue current plan of care  Will continue to monitor her status.   MD is aware of resident's narcotic use and is in agreement with current plan of care. We will attempt to wean resident as appropriate.  Synthia Innocent NP [facility] Adult Medicine  Contact [phone] Monday through Friday 8am- 5pm  After hours call [phone]

## 2020-02-01 ENCOUNTER — Non-Acute Institutional Stay (SKILLED_NURSING_FACILITY): Payer: Medicare Other | Admitting: Adult Health

## 2020-02-01 ENCOUNTER — Encounter: Payer: Self-pay | Admitting: Adult Health

## 2020-02-01 DIAGNOSIS — I5032 Chronic diastolic (congestive) heart failure: Secondary | ICD-10-CM

## 2020-02-01 DIAGNOSIS — E440 Moderate protein-calorie malnutrition: Secondary | ICD-10-CM

## 2020-02-01 DIAGNOSIS — G20 Parkinson's disease: Secondary | ICD-10-CM | POA: Diagnosis not present

## 2020-02-01 NOTE — Progress Notes (Signed)
Location:    [facility] Room Number: 102/P Place of Service:  SNF (31)   CODE STATUS: DNR  Allergies  Allergen Reactions  . Contrast Media [Iodinated Diagnostic Agents] Anaphylaxis and Shortness Of Breath  . Vancomycin Shortness Of Breath  . Vioxx [Rofecoxib] Other (See Comments)    REACTION: irregular heartbeat    Chief Complaint  Patient presents with  . Medical Management of Chronic Issues           protiein calorie malnutrition:   Parkinson's disease   Chronic diastolic CHF (congestive heart failure)     HPI:  She is a 85 year old long term resident of this facility being seen for the management of her chronic illnesses; protiein calorie malnutrition:  Parkinson's disease   Chronic diastolic CHF (congestive heart failure) . There are no reports of uncontrolled pain. She will have periods of time when she yells out she will decline meals and will decline her medications. There are no reports of constipation.   Past Medical History:  Diagnosis Date  . CHF (congestive heart failure) (HCC)   . History of pulmonary embolism   . Parkinson's disease (HCC)   . Venous stasis     No past surgical history on file.  Social History   Socioeconomic History  . Marital status: Single    Spouse name: Not on file  . Number of children: Not on file  . Years of education: Not on file  . Highest education level: Not on file  Occupational History  . Not on file  Tobacco Use  . Smoking status: Never Smoker  . Smokeless tobacco: Never Used  Substance and Sexual Activity  . Alcohol use: No  . Drug use: No  . Sexual activity: Not on file  Other Topics Concern  . Not on file  Social History Narrative  . Not on file   Social Determinants of Health   Financial Resource Strain:   . Difficulty of Paying Living Expenses:   Food Insecurity:   . Worried About Programme researcher, broadcasting/film/video in the Last Year:   . Barista in the Last Year:   Transportation Needs:     . Freight forwarder (Medical):   Marland Kitchen Lack of Transportation (Non-Medical):   Physical Activity:   . Days of Exercise per Week:   . Minutes of Exercise per Session:   Stress:   . Feeling of Stress :   Social Connections:   . Frequency of Communication with Friends and Family:   . Frequency of Social Gatherings with Friends and Family:   . Attends Religious Services:   . Active Member of Clubs or Organizations:   . Attends Banker Meetings:   Marland Kitchen Marital Status:   Intimate Partner Violence:   . Fear of Current or Ex-Partner:   . Emotionally Abused:   Marland Kitchen Physically Abused:   . Sexually Abused:    Family History  Problem Relation Age of Onset  . Heart disease Mother        46s      VITAL SIGNS BP 118/63   Pulse 70   Temp 98.2 F (36.8 C) (Oral)   Resp 16   Ht 5\' 2"  (1.575 m)   Wt 121 lb (54.9 kg)   BMI 22.13 kg/m   Outpatient Encounter Medications as of 02/01/2020  Medication Sig  . acetaminophen (TYLENOL) 325 MG tablet Take 2 tablets (650 mg total) by mouth every 6 (six) hours  as needed for mild pain, fever or headache (or Fever >/= 101).  . NON FORMULARY Diet NAS, thin liquids  . nystatin cream (MYCOSTATIN) Apply 1 application topically 3 (three) times daily as needed. Special Instructions:apply to rash under right side of neck  . Pramox-PE-Glycerin-Petrolatum (HEMORRHOIDAL EX) Apply 1 application topically 2 (two) times daily as needed (Hemorrhoids).  . rivaroxaban (XARELTO) 20 MG TABS tablet Take 1 tablet (20 mg total) by mouth daily with supper.   No facility-administered encounter medications on file as of 02/01/2020.     SIGNIFICANT DIAGNOSTIC EXAMS  LABS REVIEWED PREVIOUS    07-13-19: wbc 8.9; hgb 13.4; hct 42.7; mcv 89.5 plt 203; glucose 115; bun 14; creat 0.49; k+ 3.7; na++ 140; ca 8.5 urine culure no growth 07-19-19; wbc 6.0; hgb 12.6; hct 41.0. mcv 90.7 plt 216; glucose 79; bun 19; creat 0.58; k+ 4.4 na++ 137; ca 8.6 08-18-19 urine culture:  <10,000  09-11-19: urine culture: multiple species 09-24-19: wbc 6.5; hgb 14.0; hct 44.3; mcv 89. 7plt 213; glucose 93; bun 18; creat 0.54; k+ 3.6; na++ 139; ca 9.2  NO NEW LABS.     Review of Systems  Constitutional: Negative for malaise/fatigue.  Respiratory: Negative for cough and shortness of breath.   Cardiovascular: Negative for chest pain, palpitations and leg swelling.  Gastrointestinal: Negative for abdominal pain, constipation and heartburn.  Musculoskeletal: Negative for back pain, joint pain and myalgias.  Skin: Negative.   Neurological: Negative for dizziness.  Psychiatric/Behavioral: The patient is not nervous/anxious.     Physical Exam Constitutional:      General: She is not in acute distress.    Appearance: She is well-developed. She is not diaphoretic.  Neck:     Thyroid: No thyromegaly.  Cardiovascular:     Rate and Rhythm: Normal rate and regular rhythm.     Pulses: Normal pulses.     Heart sounds: Normal heart sounds.  Pulmonary:     Effort: Pulmonary effort is normal. No respiratory distress.     Breath sounds: Normal breath sounds.  Abdominal:     General: Bowel sounds are normal. There is no distension.     Palpations: Abdomen is soft.     Tenderness: There is no abdominal tenderness.  Musculoskeletal:     Cervical back: Neck supple.     Right lower leg: No edema.     Left lower leg: No edema.     Comments: Neck is weak has contracture  Is able to move all extremities right extremity with ataxic movements    Lymphadenopathy:     Cervical: No cervical adenopathy.  Skin:    General: Skin is warm and dry.  Neurological:     Mental Status: She is alert. Mental status is at baseline.  Psychiatric:        Mood and Affect: Mood normal.     ASSESSMENT/ PLAN:  TODAY;   1. protiein calorie malnutrition: is without change wight is 121 pounds will decline meals at times.   2. Parkinson's disease without change will monitor   3. Chronic diastolic  CHF (congestive heart failure) is compensated will monitor    PREVIOUS  4. Physical debility: no change in status will monitor   5. Chronic pulmonary embolism without acuate or cor pulmonale unspecified pulmonary embolism type: is stable is on long term xarelto 20 mg daily will decline this medication at times.         MD is aware of resident's narcotic use and is in agreement  with current plan of care. We will attempt to wean resident as appropriate.  Ok Edwards NP [facility]  Contact [phone] Monday through Friday 8am- 5pm  After hours call [phone]

## 2020-02-05 ENCOUNTER — Encounter: Payer: Self-pay | Admitting: Adult Health

## 2020-02-05 ENCOUNTER — Non-Acute Institutional Stay (SKILLED_NURSING_FACILITY): Payer: Medicare Other | Admitting: Adult Health

## 2020-02-05 DIAGNOSIS — Z0000 Encounter for general adult medical examination without abnormal findings: Secondary | ICD-10-CM

## 2020-02-05 NOTE — Patient Instructions (Signed)
  Ms. Shoff , Thank you for taking time to come for your Medicare Wellness Visit. I appreciate your ongoing commitment to your health goals. Please review the following plan we discussed and let me know if I can assist you in the future.   These are the goals we discussed: Goals    . DIET - INCREASE WATER INTAKE    . Follow up with Provider as scheduled    . General - Client will not be readmitted within 30 days (C-SNP)    . Have 3 meals a day       This is a list of the screening recommended for you and due dates:  Health Maintenance  Topic Date Due  . COVID-19 Vaccine (1) Never done  . Tetanus Vaccine  Never done  . Pneumonia vaccines (2 of 2 - PCV13) 06/22/2008  . Flu Shot  01/21/2020  . DEXA scan (bone density measurement)  Discontinued

## 2020-02-05 NOTE — Progress Notes (Signed)
Subjective:   [name] is a 85 y.o. female who presents for Medicare Annual (Subsequent) preventive examination.  Long term resident of [facility]    Review of Systems  Unable to perform ROS: Other (declined to answer )    Cardiac Risk Factors include: advanced age (>69men, >20 women);sedentary lifestyle     Objective:    Today's Vitals   02/05/20 0943  BP: (!) 119/52  Pulse: 79  Resp: 16  Temp: (!) 97.3 F (36.3 C)  TempSrc: Oral  Weight: 121 lb (54.9 kg)  Height: 5\' 2"  (1.575 m)   Body mass index is 22.13 kg/m.  Advanced Directives 02/05/2020 02/01/2020 01/25/2020 01/02/2020 12/04/2019 11/07/2019 11/02/2019  Does Patient Have a Medical Advance Directive? - Yes Yes Yes Yes Yes Yes  Type of Advance Directive Out of facility DNR (pink MOST or yellow form) Out of facility DNR (pink MOST or yellow form) Out of facility DNR (pink MOST or yellow form) Out of facility DNR (pink MOST or yellow form) Out of facility DNR (pink MOST or yellow form) Out of facility DNR (pink MOST or yellow form) Out of facility DNR (pink MOST or yellow form)  Does patient want to make changes to medical advance directive? No - Patient declined No - Patient declined No - Patient declined No - Patient declined No - Patient declined No - Patient declined No - Patient declined  Copy of Healthcare Power of Attorney in Chart? - - - - - - -  Would patient like information on creating a medical advance directive? - - - - - - -  Pre-existing out of facility DNR order (yellow form or pink MOST form) - Yellow form placed in chart (order not valid for inpatient use) Yellow form placed in chart (order not valid for inpatient use) Yellow form placed in chart (order not valid for inpatient use) Yellow form placed in chart (order not valid for inpatient use) Yellow form placed in chart (order not valid for inpatient use) Yellow form placed in chart (order not valid for inpatient use)    Current Medications (verified) Outpatient  Encounter Medications as of 02/05/2020  Medication Sig  . acetaminophen (TYLENOL) 325 MG tablet Take 2 tablets (650 mg total) by mouth every 6 (six) hours as needed for mild pain, fever or headache (or Fever >/= 101).  . NON FORMULARY Diet NAS, thin liquids  . nystatin cream (MYCOSTATIN) Apply 1 application topically 3 (three) times daily as needed. Special Instructions:apply to rash under right side of neck  . Pramox-PE-Glycerin-Petrolatum (HEMORRHOIDAL EX) Apply 1 application topically 2 (two) times daily as needed (Hemorrhoids).  . rivaroxaban (XARELTO) 20 MG TABS tablet Take 1 tablet (20 mg total) by mouth daily with supper.   No facility-administered encounter medications on file as of 02/05/2020.    Allergies (verified) Contrast media [iodinated diagnostic agents], Vancomycin, and Vioxx [rofecoxib]   History: Past Medical History:  Diagnosis Date  . CHF (congestive heart failure) (HCC)   . History of pulmonary embolism   . Parkinson's disease (HCC)   . Venous stasis    History reviewed. No pertinent surgical history. Family History  Problem Relation Age of Onset  . Heart disease Mother        3s   Social History   Socioeconomic History  . Marital status: Single    Spouse name: Not on file  . Number of children: Not on file  . Years of education: Not on file  . Highest education level:  Not on file  Occupational History  . Not on file  Tobacco Use  . Smoking status: Never Smoker  . Smokeless tobacco: Never Used  Substance and Sexual Activity  . Alcohol use: No  . Drug use: No  . Sexual activity: Not on file  Other Topics Concern  . Not on file  Social History Narrative  . Not on file   Social Determinants of Health   Financial Resource Strain:   . Difficulty of Paying Living Expenses:   Food Insecurity:   . Worried About Programme researcher, broadcasting/film/video in the Last Year:   . Barista in the Last Year:   Transportation Needs:   . Freight forwarder (Medical):    Marland Kitchen Lack of Transportation (Non-Medical):   Physical Activity:   . Days of Exercise per Week:   . Minutes of Exercise per Session:   Stress:   . Feeling of Stress :   Social Connections:   . Frequency of Communication with Friends and Family:   . Frequency of Social Gatherings with Friends and Family:   . Attends Religious Services:   . Active Member of Clubs or Organizations:   . Attends Banker Meetings:   Marland Kitchen Marital Status:     Tobacco Counseling Counseling given: Not Answered   Clinical Intake:  Pre-visit preparation completed: Yes  Pain : No/denies pain     BMI - recorded: 22.13 Nutritional Status: BMI of 19-24  Normal Nutritional Risks: Failure to thrive, Other (Comment) (will decline meals) Diabetes: No     Diabetic?no  Interpreter Needed?: No      Activities of Daily Living In your present state of health, do you have any difficulty performing the following activities: 02/05/2020 07/14/2019  Hearing? N N  Vision? N N  Difficulty concentrating or making decisions? N N  Walking or climbing stairs? Y Y  Dressing or bathing? Y Y  Doing errands, shopping? Malvin Johns  Preparing Food and eating ? Y -  Using the Toilet? Y -  In the past six months, have you accidently leaked urine? Y -  Do you have problems with loss of bowel control? Y -  Managing your Medications? Y -  Managing your Finances? Y -  Housekeeping or managing your Housekeeping? Y -  Some recent data might be hidden    Patient Care Team: Sharee Holster, NP as PCP - General (Geriatric Medicine) [facility]  Indicate any recent Medical Services you may have received from other than [facility] providers in the past year (date may be approximate).     Assessment:   This is a routine wellness examination for [name].  Hearing/Vision screen No exam data present  Dietary issues and exercise activities discussed: Current Exercise Habits: The patient does  not participate in regular exercise at present  Goals    . DIET - INCREASE WATER INTAKE    . Follow up with Provider as scheduled    . General - Client will not be readmitted within 30 days (C-SNP)    . Have 3 meals a day      Depression Screen PHQ 2/9 Scores 02/05/2020  Exception Documentation Patient refusal    Fall Risk Fall Risk  02/05/2020  Falls in the past year? 0  Number falls in past yr: 0  Injury with Fall? 0  Risk for fall due to : Impaired balance/gait;Impaired mobility  Follow up Falls evaluation completed    Any stairs  in or around the home? no If so, are there any without handrails? n/a Home free of loose throw rugs in walkways, pet beds, electrical cords, etc? yes  Adequate lighting in your home to reduce risk of falls? Yes   ASSISTIVE DEVICES UTILIZED TO PREVENT FALLS:  Life alert? n/a Use of a cane, walker or w/c? Is bed ridden  Grab bars in the bathroom?yes  Shower chair or bench in shower? Yes  Elevated toilet seat or a handicapped toilet? Yes   TIMED UP AND GO:  Was the test performed? no   Cognitive Function:        Immunizations Immunization History  Administered Date(s) Administered  . Pneumococcal Polysaccharide-23 06/23/2007     Qualifies for Shingles Vaccine?n/a  Screening Tests Health Maintenance  Topic Date Due  . COVID-19 Vaccine (1) Never done  . TETANUS/TDAP  Never done  . PNA vac Low Risk Adult (2 of 2 - PCV13) 06/22/2008  . INFLUENZA VACCINE  01/21/2020  . DEXA SCAN  Discontinued    Health Maintenance  Health Maintenance Due  Topic Date Due  . COVID-19 Vaccine (1) Never done  . TETANUS/TDAP  Never done  . PNA vac Low Risk Adult (2 of 2 - PCV13) 06/22/2008  . INFLUENZA VACCINE  01/21/2020    Lung Cancer Screening: (Low Dose CT Chest recommended if Age 8-80 years, 30 pack-year currently smoking OR have quit w/in 15years.) does not qualify.   Lung Cancer Screening Referral: n/a  Additional  Screening:  Hepatitis C Screening: n/a   Vision Screening: Recommended annual ophthalmology exams for early detection of glaucoma and other disorders of the eye. Is the patient up to date with their annual eye exam?  no Dental Screening: Recommended annual dental exams for proper oral hygiene  Community Resource Referral / Chronic Care Management: CRR required this visit?  no  CCM required this visit?  no     Plan:     I have personally reviewed and noted the following in the patient's chart:   . Medical and social history . Use of alcohol, tobacco or illicit drugs  . Current medications and supplements . Functional ability and status . Nutritional status . Physical activity . Advanced directives . List of other physicians . Hospitalizations, surgeries, and ER visits in previous 12 months . Vitals . Screenings to include cognitive, depression, and falls . Referrals and appointments  In addition, I have reviewed and discussed with patient certain preventive protocols, quality metrics, and best practice recommendations. A written personalized care plan for preventive services as well as general preventive health recommendations were provided to patient.     Sharee Holster, NP   02/05/2020

## 2020-03-06 ENCOUNTER — Encounter: Payer: Self-pay | Admitting: Adult Health

## 2020-03-06 ENCOUNTER — Non-Acute Institutional Stay (SKILLED_NURSING_FACILITY): Payer: Medicare Other | Admitting: Adult Health

## 2020-03-06 DIAGNOSIS — I872 Venous insufficiency (chronic) (peripheral): Secondary | ICD-10-CM

## 2020-03-06 DIAGNOSIS — F03918 Unspecified dementia, unspecified severity, with other behavioral disturbance: Secondary | ICD-10-CM | POA: Insufficient documentation

## 2020-03-06 DIAGNOSIS — E440 Moderate protein-calorie malnutrition: Secondary | ICD-10-CM | POA: Diagnosis not present

## 2020-03-06 DIAGNOSIS — F0391 Unspecified dementia with behavioral disturbance: Secondary | ICD-10-CM

## 2020-03-06 NOTE — Progress Notes (Signed)
Location:    [facility] Room Number: 102/P Place of Service:  SNF (31)   CODE STATUS: DNR  Allergies  Allergen Reactions  . Contrast Media [Iodinated Diagnostic Agents] Anaphylaxis and Shortness Of Breath  . Vancomycin Shortness Of Breath  . Vioxx [Rofecoxib] Other (See Comments)    REACTION: irregular heartbeat    Chief Complaint  Patient presents with  . Medical Management of Chronic Issues            Venous (peripheral) insufficiency:     Protein calorie malnutrition:   Psychosis in elderly with behavioral disturbance:    HPI:  She is a 85 year old long term resident of this facility being seen for the management of her chronic illnesses; Venous (peripheral) insufficiency:     Protein calorie malnutrition:   Psychosis in elderly with behavioral disturbance. She is having frequent episodes of yelling out; acting out toward staff; talking to people who are not present. There are no reports of pain. She will decline meals at times.   Past Medical History:  Diagnosis Date  . CHF (congestive heart failure) (HCC)   . History of pulmonary embolism   . Parkinson's disease (HCC)   . Venous stasis     No past surgical history on file.  Social History   Socioeconomic History  . Marital status: Single    Spouse name: Not on file  . Number of children: Not on file  . Years of education: Not on file  . Highest education level: Not on file  Occupational History  . Not on file  Tobacco Use  . Smoking status: Never Smoker  . Smokeless tobacco: Never Used  Substance and Sexual Activity  . Alcohol use: No  . Drug use: No  . Sexual activity: Not on file  Other Topics Concern  . Not on file  Social History Narrative  . Not on file   Social Determinants of Health   Financial Resource Strain:   . Difficulty of Paying Living Expenses: Not on file  Food Insecurity:   . Worried About Programme researcher, broadcasting/film/video in the Last Year: Not on file  . Ran Out of Food  in the Last Year: Not on file  Transportation Needs:   . Lack of Transportation (Medical): Not on file  . Lack of Transportation (Non-Medical): Not on file  Physical Activity:   . Days of Exercise per Week: Not on file  . Minutes of Exercise per Session: Not on file  Stress:   . Feeling of Stress : Not on file  Social Connections:   . Frequency of Communication with Friends and Family: Not on file  . Frequency of Social Gatherings with Friends and Family: Not on file  . Attends Religious Services: Not on file  . Active Member of Clubs or Organizations: Not on file  . Attends Banker Meetings: Not on file  . Marital Status: Not on file  Intimate Partner Violence:   . Fear of Current or Ex-Partner: Not on file  . Emotionally Abused: Not on file  . Physically Abused: Not on file  . Sexually Abused: Not on file   Family History  Problem Relation Age of Onset  . Heart disease Mother        88s      VITAL SIGNS BP 119/72   Pulse 78   Temp 98.3 F (36.8 C) (Oral)   Resp 16   Ht 5\' 2"  (1.575 m)  Wt 122 lb (55.3 kg)   BMI 22.31 kg/m   Outpatient Encounter Medications as of 03/06/2020  Medication Sig  . acetaminophen (TYLENOL) 325 MG tablet Take 2 tablets (650 mg total) by mouth every 6 (six) hours as needed for mild pain, fever or headache (or Fever >/= 101).  . NON FORMULARY Diet NAS, thin liquids  . nystatin cream (MYCOSTATIN) Apply 1 application topically 3 (three) times daily as needed. Special Instructions:apply to rash under right side of neck  . Pramox-PE-Glycerin-Petrolatum (HEMORRHOIDAL EX) Apply 1 application topically 2 (two) times daily as needed (Hemorrhoids).  . rivaroxaban (XARELTO) 20 MG TABS tablet Take 1 tablet (20 mg total) by mouth daily with supper.   No facility-administered encounter medications on file as of 03/06/2020.     SIGNIFICANT DIAGNOSTIC EXAMS   LABS REVIEWED PREVIOUS    07-13-19: wbc 8.9; hgb 13.4; hct 42.7; mcv 89.5 plt  203; glucose 115; bun 14; creat 0.49; k+ 3.7; na++ 140; ca 8.5 urine culure no growth 07-19-19; wbc 6.0; hgb 12.6; hct 41.0. mcv 90.7 plt 216; glucose 79; bun 19; creat 0.58; k+ 4.4 na++ 137; ca 8.6 08-18-19 urine culture: <10,000  09-11-19: urine culture: multiple species 09-24-19: wbc 6.5; hgb 14.0; hct 44.3; mcv 89. 7plt 213; glucose 93; bun 18; creat 0.54; k+ 3.6; na++ 139; ca 9.2  NO NEW LABS.     Review of Systems  Reason unable to perform ROS: declines to answer     Physical Exam Constitutional:      General: She is not in acute distress.    Appearance: She is well-developed. She is not diaphoretic.  Neck:     Thyroid: No thyromegaly.  Cardiovascular:     Rate and Rhythm: Normal rate and regular rhythm.     Pulses: Normal pulses.     Heart sounds: Normal heart sounds.  Pulmonary:     Effort: Pulmonary effort is normal. No respiratory distress.     Breath sounds: Normal breath sounds.  Abdominal:     General: Bowel sounds are normal. There is no distension.     Palpations: Abdomen is soft.     Tenderness: There is no abdominal tenderness.  Musculoskeletal:     Cervical back: Neck supple.     Right lower leg: No edema.     Left lower leg: No edema.     Comments: Neck is weak has contracture  Is able to move all extremities right extremity with ataxic movements   Lymphadenopathy:     Cervical: No cervical adenopathy.  Skin:    General: Skin is warm and dry.  Neurological:     Mental Status: She is alert. Mental status is at baseline.  Psychiatric:        Mood and Affect: Mood normal.       ASSESSMENT/ PLAN:  TODAY;   1. Venous (peripheral) insufficiency: is without change will monitor  2. Protein calorie malnutrition: weight is stable at 122 pounds will monitor she will decline meals at times.   3. Psychosis in elderly with behavioral disturbance: she is without change; she continues to have episodes of yelling out; talking to people who are no present. Yells  at staff; and throws things. Will monitor her status.    PREVIOUS  4. Chronic pulmonary embolism without acuate or cor pulmonale unspecified pulmonary embolism type: is stable is on long term xarelto 20 mg daily will decline this medication at times.    5. Parkinson's disease without change will monitor  6. Chronic diastolic CHF (congestive heart failure) is compensated will monitor      MD is aware of resident's narcotic use and is in agreement with current plan of care. We will attempt to wean resident as appropriate.  Synthia Innocent NP [facility]  Contact [phone] Monday through Friday 8am- 5pm  After hours call [phone]

## 2020-03-14 ENCOUNTER — Encounter: Payer: Self-pay | Admitting: Adult Health

## 2020-03-14 DIAGNOSIS — F0392 Unspecified dementia, unspecified severity, with psychotic disturbance: Secondary | ICD-10-CM | POA: Insufficient documentation

## 2020-03-27 ENCOUNTER — Encounter: Payer: Self-pay | Admitting: Internal Medicine

## 2020-03-27 ENCOUNTER — Non-Acute Institutional Stay (SKILLED_NURSING_FACILITY): Payer: Medicare Other | Admitting: Internal Medicine

## 2020-03-27 DIAGNOSIS — E538 Deficiency of other specified B group vitamins: Secondary | ICD-10-CM

## 2020-03-27 DIAGNOSIS — I2782 Chronic pulmonary embolism: Secondary | ICD-10-CM

## 2020-03-27 DIAGNOSIS — F0391 Unspecified dementia with behavioral disturbance: Secondary | ICD-10-CM

## 2020-03-27 DIAGNOSIS — F03918 Unspecified dementia, unspecified severity, with other behavioral disturbance: Secondary | ICD-10-CM

## 2020-03-27 DIAGNOSIS — G20A1 Parkinson's disease without dyskinesia, without mention of fluctuations: Secondary | ICD-10-CM

## 2020-03-27 DIAGNOSIS — G20 Parkinson's disease: Secondary | ICD-10-CM | POA: Diagnosis not present

## 2020-03-27 DIAGNOSIS — F0392 Unspecified dementia, unspecified severity, with psychotic disturbance: Secondary | ICD-10-CM

## 2020-03-27 NOTE — Assessment & Plan Note (Addendum)
12/20/2014 last B12 level on record was 255   Previously on 1000 mcg qd I gave her this information and discussed the pathophysiology of B12 deficiency in relationship to neurologic disease.  I recommended she has a B12 level updated; she replied that she would do that "when I see my neurologist".  When I asked when that might be she said she would have to see when she were able to go.

## 2020-03-27 NOTE — Patient Instructions (Signed)
See assessment and plan under each diagnosis in the problem list and acutely for this visit 

## 2020-03-27 NOTE — Assessment & Plan Note (Addendum)
Despite the history of PTE she does not take the novel anticoagulant on a regular basis but only intermittently. She states that she is not being offered the Xarelto.  I explained the potential health or life-threatening risk of not taking the anticoagulant on a regular basis.

## 2020-03-27 NOTE — Assessment & Plan Note (Addendum)
Behavioral issues include screaming intermittently & reported non compliance with meds. She declines to complete MMSE and also refuses a psychiatric evaluation. This results in impasse as to determining competency. She did not remember ever seeing Theressa Millard as her PCP at [facility].

## 2020-03-27 NOTE — Assessment & Plan Note (Signed)
Her focus is not on her profound medical issues but rather on dissatisfaction with meal selections and response to call bell.

## 2020-03-27 NOTE — Progress Notes (Signed)
NURSING HOME LOCATION:  [facility]  ROOM NUMBER:  [id number]  CODE STATUS: DNR   PCP:  Synthia Innocent NP  This is a nursing facility follow up of chronic medical diagnoses  Interim medical record and care since last [facility] visit was updated with review of diagnostic studies and change in clinical status since last visit were documented.  HPI: She is a permanent resident facility with past medical history of Parkinson's disease, history of PTE, history of congestive heart failure,recent history of Covid infection and PVD. She has been diagnosed as having neurocognitive deficits as dementia with psychoses.  This is in the context of Parkinson's disease with apparent hallucinations.   Even though she has a history of PTE she will not take the novel anticoagulant on a routine basis.  Clinically she is felt to lack competency but she has declined MMSE or psychiatric evaluations. There are no CNS imaging studies in Epic. The most recent chemistries and CBC were in April of this year and were normal.  B12 level was low normal at 255 but this was in 2016.  TSH was therapeutic but is not current.  There is no VDRL or RPR on record.  Review of systems: She is complaining that she is not sleeping well which is a chronic problem.  She also states that she is not receiving the menu orders she requests.  She states that when she tries to contact staff by the call bell "they turn it off".  She states that these things make her anxious.  She states that She has not seen her neurologist for several years as "there is nothing he can do".  She cannot remember his name but apparently he is in [place]. Initially she gave the date as March 26, 2000 but then corrected the year.She knew the president. She denies any hallucinations.  Constitutional: No fever, significant weight change  Eyes: No redness, discharge, pain, vision change ENT/mouth: No nasal congestion,  purulent discharge,  earache, change in hearing, sore throat  Cardiovascular: No chest pain, palpitations, paroxysmal nocturnal dyspnea  Respiratory: No cough, sputum production, hemoptysis, significant snoring, apnea   Gastrointestinal: No heartburn, dysphagia, abdominal pain, nausea /vomiting, rectal bleeding, melena, change in bowels Genitourinary: No dysuria, hematuria, pyuria Dermatologic: No rash, pruritus, change in appearance of skin Neurologic: No dizziness, headache, syncope, seizures Psychiatric: No significant depression, anorexia Endocrine: No change in hair/skin/nails, excessive thirst, excessive hunger, excessive urination  Hematologic/lymphatic: No significant bruising, lymphadenopathy, abnormal bleeding Allergy/immunology: No itchy/watery eyes, significant sneezing, urticaria, angioedema  Physical exam:  Pertinent or positive findings: She has incredible torticollis and neck flexion.  The right cheek essentially sits on the right shoulder.  There is splotchy light erythema over the left cheek.  The right cheek could not be visualized.  The right nasolabial fold is decreased.  Heart sounds are very distant and virtually cannot be auscultated.  Breath sounds are also decreased.  When she took a deep breath I can hear active bowel sounds in the left chest.  Abdomen is protuberant.  Pedal pulses are decreased.  There is persistent tremor of the hands as if she is attempting to make a fist or clawing at something. It is not a classic "pill rolling" tremor. This is more prominent on the right than the left.  There is no opposition to strength testing in the lower extremities.  The lower extremities reveal shiny changes with faint reddish hyperpigmentation over the shins. Clinically this does not appear to  be cellulitis.  General appearance: no acute distress, increased work of breathing is present.   Lymphatic: No lymphadenopathy about the head, neck, axilla. Eyes: No conjunctival inflammation or lid edema is  present. There is no scleral icterus. Ears:  External ear exam shows no significant lesions or deformities.   Nose:  External nasal examination shows no deformity or inflammation. Nasal mucosa are pink and moist without lesions, exudates Neck:  No thyromegaly, masses, tenderness noted.    Lungs:  without wheezes, rhonchi, rales, rubs. Abdomen: Bowel sounds are normal. Abdomen is soft and nontender with no organomegaly, hernias, masses. GU: Deferred  Extremities:  No cyanosis, clubbing  Neurologic exam :Balance, Rhomberg, finger to nose testing could not be completed due to clinical state Skin: Warm & dry w/o tenting. No significant lesions.  See summary under each active problem in the Problem List with associated updated therapeutic plan

## 2020-03-27 NOTE — Assessment & Plan Note (Addendum)
The tremor present is more of a clasping tremor of the hands, greater on the right.  She denies hallucinations.  At this time she is noncommittal as to whether she will comply with a follow-up neurology appointment.  She knew that her neurologist is in [place] but could not give me his name.  She states that she quit seeing him "2 years ago as there is nothing he can do".

## 2020-04-18 ENCOUNTER — Encounter: Payer: Self-pay | Admitting: Adult Health

## 2020-04-18 ENCOUNTER — Non-Acute Institutional Stay (SKILLED_NURSING_FACILITY): Payer: Medicare Other | Admitting: Adult Health

## 2020-04-18 DIAGNOSIS — F0391 Unspecified dementia with behavioral disturbance: Secondary | ICD-10-CM | POA: Diagnosis not present

## 2020-04-18 DIAGNOSIS — I5032 Chronic diastolic (congestive) heart failure: Secondary | ICD-10-CM

## 2020-04-18 DIAGNOSIS — I2782 Chronic pulmonary embolism: Secondary | ICD-10-CM | POA: Diagnosis not present

## 2020-04-18 DIAGNOSIS — F0392 Unspecified dementia, unspecified severity, with psychotic disturbance: Secondary | ICD-10-CM

## 2020-04-18 NOTE — Progress Notes (Signed)
Location:    [facility] Room Number: 102/P Place of Service:  SNF (31)   CODE STATUS: DNR  Allergies  Allergen Reactions  . Contrast Media [Iodinated Diagnostic Agents] Anaphylaxis and Shortness Of Breath  . Vancomycin Shortness Of Breath  . Vioxx [Rofecoxib] Other (See Comments)    REACTION: irregular heartbeat    Chief Complaint  Patient presents with  . Acute Visit    Care Plan Meeting    HPI:  We have come together for her care plan meeting. BIMS 10/15 mood 2/30. There have been no falls. Her weight is stable. She will decline meals and medications at times. She remains extensive to dependent care with her adls. She requires assistance with meals. She is incontinent of bladder and bowel. She continues to have periods where she will yell out; will cuss at staff. She continues to be followed for her chronic illnesses including: Chronic diastolic CHD (congestive heart failure). Chronic pulmonary embolism without acute cor pulmonale unspecified pulmonary embolism type   Dementia with psychosis   Past Medical History:  Diagnosis Date  . CHF (congestive heart failure) (HCC)   . History of pulmonary embolism   . Parkinson's disease (HCC)   . Venous stasis     No past surgical history on file.  Social History   Socioeconomic History  . Marital status: Single    Spouse name: Not on file  . Number of children: Not on file  . Years of education: Not on file  . Highest education level: Not on file  Occupational History  . Not on file  Tobacco Use  . Smoking status: Never Smoker  . Smokeless tobacco: Never Used  Substance and Sexual Activity  . Alcohol use: No  . Drug use: No  . Sexual activity: Not on file  Other Topics Concern  . Not on file  Social History Narrative  . Not on file   Social Determinants of Health   Financial Resource Strain:   . Difficulty of Paying Living Expenses: Not on file  Food Insecurity:   . Worried About Community education officer in the Last Year: Not on file  . Ran Out of Food in the Last Year: Not on file  Transportation Needs:   . Lack of Transportation (Medical): Not on file  . Lack of Transportation (Non-Medical): Not on file  Physical Activity:   . Days of Exercise per Week: Not on file  . Minutes of Exercise per Session: Not on file  Stress:   . Feeling of Stress : Not on file  Social Connections:   . Frequency of Communication with Friends and Family: Not on file  . Frequency of Social Gatherings with Friends and Family: Not on file  . Attends Religious Services: Not on file  . Active Member of Clubs or Organizations: Not on file  . Attends Banker Meetings: Not on file  . Marital Status: Not on file  Intimate Partner Violence:   . Fear of Current or Ex-Partner: Not on file  . Emotionally Abused: Not on file  . Physically Abused: Not on file  . Sexually Abused: Not on file   Family History  Problem Relation Age of Onset  . Heart disease Mother        66s      VITAL SIGNS BP (!) 138/54   Pulse 70   Temp 97.8 F (36.6 C)   Resp 20   Ht 5\' 2"  (1.575 m)  Wt 126 lb 3.2 oz (57.2 kg)   BMI 23.08 kg/m   Outpatient Encounter Medications as of 04/18/2020  Medication Sig  . acetaminophen (TYLENOL) 325 MG tablet Take 2 tablets (650 mg total) by mouth every 6 (six) hours as needed for mild pain, fever or headache (or Fever >/= 101).  . cetaphil (CETAPHIL) lotion Apply 1 application topically daily as needed for dry skin. Use Sparingly to face  . NON FORMULARY Diet NAS, thin liquids  . nystatin cream (MYCOSTATIN) Apply 1 application topically 3 (three) times daily as needed. Special Instructions:apply to rash under right side of neck  . Pramox-PE-Glycerin-Petrolatum (HEMORRHOIDAL EX) Apply 1 application topically 2 (two) times daily as needed (Hemorrhoids).  . rivaroxaban (XARELTO) 20 MG TABS tablet Take 1 tablet (20 mg total) by mouth daily with supper.   No  facility-administered encounter medications on file as of 04/18/2020.     SIGNIFICANT DIAGNOSTIC EXAMS   LABS REVIEWED PREVIOUS    07-13-19: wbc 8.9; hgb 13.4; hct 42.7; mcv 89.5 plt 203; glucose 115; bun 14; creat 0.49; k+ 3.7; na++ 140; ca 8.5 urine culure no growth 07-19-19; wbc 6.0; hgb 12.6; hct 41.0. mcv 90.7 plt 216; glucose 79; bun 19; creat 0.58; k+ 4.4 na++ 137; ca 8.6 08-18-19 urine culture: <10,000  09-11-19: urine culture: multiple species 09-24-19: wbc 6.5; hgb 14.0; hct 44.3; mcv 89. 7plt 213; glucose 93; bun 18; creat 0.54; k+ 3.6; na++ 139; ca 9.2  NO NEW LABS.    Review of Systems  Reason unable to perform ROS: did not answer questions     Physical Exam Constitutional:      General: She is not in acute distress.    Appearance: She is well-developed. She is not diaphoretic.  Neck:     Thyroid: No thyromegaly.  Cardiovascular:     Rate and Rhythm: Normal rate and regular rhythm.     Pulses: Normal pulses.     Heart sounds: Normal heart sounds.  Pulmonary:     Effort: Pulmonary effort is normal. No respiratory distress.     Breath sounds: Normal breath sounds.  Abdominal:     General: Bowel sounds are normal. There is no distension.     Palpations: Abdomen is soft.     Tenderness: There is no abdominal tenderness.  Musculoskeletal:     Cervical back: Neck supple.     Right lower leg: No edema.     Left lower leg: No edema.     Comments: Neck is weak has contracture  Is able to move all extremities right extremity with ataxic movements    Lymphadenopathy:     Cervical: No cervical adenopathy.  Skin:    General: Skin is warm and dry.  Neurological:     Mental Status: She is alert. Mental status is at baseline.  Psychiatric:        Mood and Affect: Mood normal.       ASSESSMENT/ PLAN:  TODAY  1. Chronic diastolic CHD (congestive heart failure) 2. Chronic pulmonary embolism without acute cor pulmonale unspecified pulmonary embolism type  3. Dementia  with psychosis  Will continue current medications Will continue current plan of care Will continue to monitor her status.    MD is aware of resident's narcotic use and is in agreement with current plan of care. We will attempt to wean resident as appropriate.  Synthia Innocent NP [facility]  Contact [phone] Monday through Friday 8am- 5pm  After hours call [phone]

## 2020-04-23 ENCOUNTER — Non-Acute Institutional Stay (SKILLED_NURSING_FACILITY): Payer: Medicare Other | Admitting: Adult Health

## 2020-04-23 ENCOUNTER — Encounter: Payer: Self-pay | Admitting: Adult Health

## 2020-04-23 DIAGNOSIS — F0391 Unspecified dementia with behavioral disturbance: Secondary | ICD-10-CM | POA: Diagnosis not present

## 2020-04-23 DIAGNOSIS — G20 Parkinson's disease: Secondary | ICD-10-CM

## 2020-04-23 DIAGNOSIS — F0392 Unspecified dementia, unspecified severity, with psychotic disturbance: Secondary | ICD-10-CM

## 2020-04-23 DIAGNOSIS — I2782 Chronic pulmonary embolism: Secondary | ICD-10-CM

## 2020-04-23 NOTE — Progress Notes (Signed)
Location:    [facility] Room Number: 102/P Place of Service:  SNF (31)   CODE STATUS: DNR  Allergies  Allergen Reactions   Contrast Media [Iodinated Diagnostic Agents] Anaphylaxis and Shortness Of Breath   Vancomycin Shortness Of Breath   Vioxx [Rofecoxib] Other (See Comments)    REACTION: irregular heartbeat    Chief Complaint  Patient presents with   Medical Management of Chronic Issues         Dementia with psychosis:   Chronic pulmonary embolism without acute cor pulmonale unspecified pulmonary embolism type:   Parkinson's disease     HPI:  She is a 85 year old long term resident of this facility being seen for the management of her chronic illnesses: Dementia with psychosis:   Chronic pulmonary embolism without acute cor pulmonale unspecified pulmonary embolism type:   Parkinson's disease. There are no reports of uncontrolled pain; her weight is stable. She continues to yell out; is aggressive with staff members.   Past Medical History:  Diagnosis Date   CHF (congestive heart failure) (HCC)    History of pulmonary embolism    Parkinson's disease (HCC)    Venous stasis     No past surgical history on file.  Social History   Socioeconomic History   Marital status: Single    Spouse name: Not on file   Number of children: Not on file   Years of education: Not on file   Highest education level: Not on file  Occupational History   Not on file  Tobacco Use   Smoking status: Never Smoker   Smokeless tobacco: Never Used  Substance and Sexual Activity   Alcohol use: No   Drug use: No   Sexual activity: Not on file  Other Topics Concern   Not on file  Social History Narrative   Not on file   Social Determinants of Health   Financial Resource Strain:    Difficulty of Paying Living Expenses: Not on file  Food Insecurity:    Worried About Running Out of Food in the Last Year: Not on file   Ran Out of Food in the Last  Year: Not on file  Transportation Needs:    Lack of Transportation (Medical): Not on file   Lack of Transportation (Non-Medical): Not on file  Physical Activity:    Days of Exercise per Week: Not on file   Minutes of Exercise per Session: Not on file  Stress:    Feeling of Stress : Not on file  Social Connections:    Frequency of Communication with Friends and Family: Not on file   Frequency of Social Gatherings with Friends and Family: Not on file   Attends Religious Services: Not on file   Active Member of Clubs or Organizations: Not on file   Attends Banker Meetings: Not on file   Marital Status: Not on file  Intimate Partner Violence:    Fear of Current or Ex-Partner: Not on file   Emotionally Abused: Not on file   Physically Abused: Not on file   Sexually Abused: Not on file   Family History  Problem Relation Age of Onset   Heart disease Mother        90s      VITAL SIGNS BP 112/68    Pulse 70    Temp 97.7 F (36.5 C)    Resp 20    Ht 5\' 2"  (1.575 m)    Wt 130 lb 9.6  oz (59.2 kg)    BMI 23.89 kg/m   Outpatient Encounter Medications as of 04/23/2020  Medication Sig   acetaminophen (TYLENOL) 325 MG tablet Take 2 tablets (650 mg total) by mouth every 6 (six) hours as needed for mild pain, fever or headache (or Fever >/= 101).   cetaphil (CETAPHIL) lotion Apply 1 application topically daily as needed for dry skin. Use Sparingly to face   NON FORMULARY Diet NAS, thin liquids   nystatin cream (MYCOSTATIN) Apply 1 application topically 3 (three) times daily as needed. Special Instructions:apply to rash under right side of neck   Pramox-PE-Glycerin-Petrolatum (HEMORRHOIDAL EX) Apply 1 application topically 2 (two) times daily as needed (Hemorrhoids).   rivaroxaban (XARELTO) 20 MG TABS tablet Take 1 tablet (20 mg total) by mouth daily with supper.   No facility-administered encounter medications on file as of 04/23/2020.     SIGNIFICANT  DIAGNOSTIC EXAMS   LABS REVIEWED PREVIOUS    07-13-19: wbc 8.9; hgb 13.4; hct 42.7; mcv 89.5 plt 203; glucose 115; bun 14; creat 0.49; k+ 3.7; na++ 140; ca 8.5 urine culure no growth 07-19-19; wbc 6.0; hgb 12.6; hct 41.0. mcv 90.7 plt 216; glucose 79; bun 19; creat 0.58; k+ 4.4 na++ 137; ca 8.6 08-18-19 urine culture: <10,000  09-11-19: urine culture: multiple species 09-24-19: wbc 6.5; hgb 14.0; hct 44.3; mcv 89. 7plt 213; glucose 93; bun 18; creat 0.54; k+ 3.6; na++ 139; ca 9.2  NO NEW LABS.     Review of Systems  Reason unable to perform ROS: will decline to answer      Physical Exam Constitutional:      General: She is not in acute distress.    Appearance: She is well-developed. She is not diaphoretic.  Neck:     Thyroid: No thyromegaly.  Cardiovascular:     Rate and Rhythm: Normal rate and regular rhythm.     Pulses: Normal pulses.     Heart sounds: Normal heart sounds.  Pulmonary:     Effort: Pulmonary effort is normal. No respiratory distress.     Breath sounds: Normal breath sounds.  Abdominal:     General: Bowel sounds are normal. There is no distension.     Palpations: Abdomen is soft.     Tenderness: There is no abdominal tenderness.  Musculoskeletal:     Cervical back: Neck supple.     Right lower leg: No edema.     Left lower leg: No edema.     Comments: Neck is weak has contracture  Is able to move all extremities right extremity with ataxic movements   Lymphadenopathy:     Cervical: No cervical adenopathy.  Skin:    General: Skin is warm and dry.  Neurological:     Mental Status: She is alert. Mental status is at baseline.  Psychiatric:        Mood and Affect: Mood normal.     ASSESSMENT/ PLAN:  TODAY;   1. Dementia with psychosis: is without change: weight is 130 pounds is currently not on medications will monitor her status.   2. Chronic pulmonary embolism without acute cor pulmonale unspecified pulmonary embolism type: is stable is on long term  xarelto 20 mg daily will decline this medication at times.   3. Parkinson's disease  without change will monitor   PREVIOUS  4. Venous (peripheral) insufficiency: is without change will monitor  5. Protein calorie malnutrition: weight is stable at 130 pounds will monitor she will decline meals at times.  6. Psychosis in elderly with behavioral disturbance: she is without change; she continues to have episodes of yelling out; talking to people who are no present. Yells at staff; and throws things. Will monitor her status.     Will check cbc; cmp   MD is aware of resident's narcotic use and is in agreement with current plan of care. We will attempt to wean resident as appropriate.  Synthia Innocent NP [facility]  Contact [phone] Monday through Friday 8am- 5pm  After hours call [phone]

## 2020-05-02 ENCOUNTER — Other Ambulatory Visit (HOSPITAL_COMMUNITY)
Admission: RE | Admit: 2020-05-02 | Discharge: 2020-05-02 | Disposition: A | Discharge status: Home / Self Care | Payer: Medicare Other | Source: Skilled Nursing Facility | Attending: Adult Health | Admitting: Adult Health

## 2020-05-02 DIAGNOSIS — I5032 Chronic diastolic (congestive) heart failure: Secondary | ICD-10-CM | POA: Insufficient documentation

## 2020-05-02 LAB — COMPREHENSIVE METABOLIC PANEL
ALT: 20 U/L (ref 0–44)
AST: 23 U/L (ref 15–41)
Albumin: 3.7 g/dL (ref 3.5–5.0)
Alkaline Phosphatase: 57 U/L (ref 38–126)
Anion gap: 8 (ref 5–15)
BUN: 28 mg/dL — ABNORMAL HIGH (ref 8–23)
CO2: 27 mmol/L (ref 22–32)
Calcium: 9.1 mg/dL (ref 8.9–10.3)
Chloride: 104 mmol/L (ref 98–111)
Creatinine, Ser: 0.62 mg/dL (ref 0.44–1.00)
GFR, Estimated: >60 mL/min (ref >60)
Glucose, Bld: 69 mg/dL — ABNORMAL LOW (ref 70–99)
Potassium: 4 mmol/L (ref 3.5–5.1)
Sodium: 139 mmol/L (ref 135–145)
Total Bilirubin: 0.6 mg/dL (ref 0.3–1.2)
Total Protein: 6.7 g/dL (ref 6.5–8.1)

## 2020-05-02 LAB — CBC
HCT: 43.5 % (ref 36.0–46.0)
Hemoglobin: 13.4 g/dL (ref 12.0–15.0)
MCH: 28.5 pg (ref 26.0–34.0)
MCHC: 30.8 g/dL (ref 30.0–36.0)
MCV: 92.6 fL (ref 80.0–100.0)
Platelets: 221 10*3/uL (ref 150–400)
RBC: 4.7 MIL/uL (ref 3.87–5.11)
RDW: 13.5 % (ref 11.5–15.5)
WBC: 6.7 10*3/uL (ref 4.0–10.5)
nRBC: 0 % (ref 0.0–0.2)

## 2020-05-19 IMAGING — DX DG CHEST 2V
2 series · 2 of 2 positions shown · non-contrast
Comparison: 12/14/2017

CLINICAL DATA: Chest pain and shortness of breath.

EXAM:
CHEST - 2 VIEW

[chest ap]
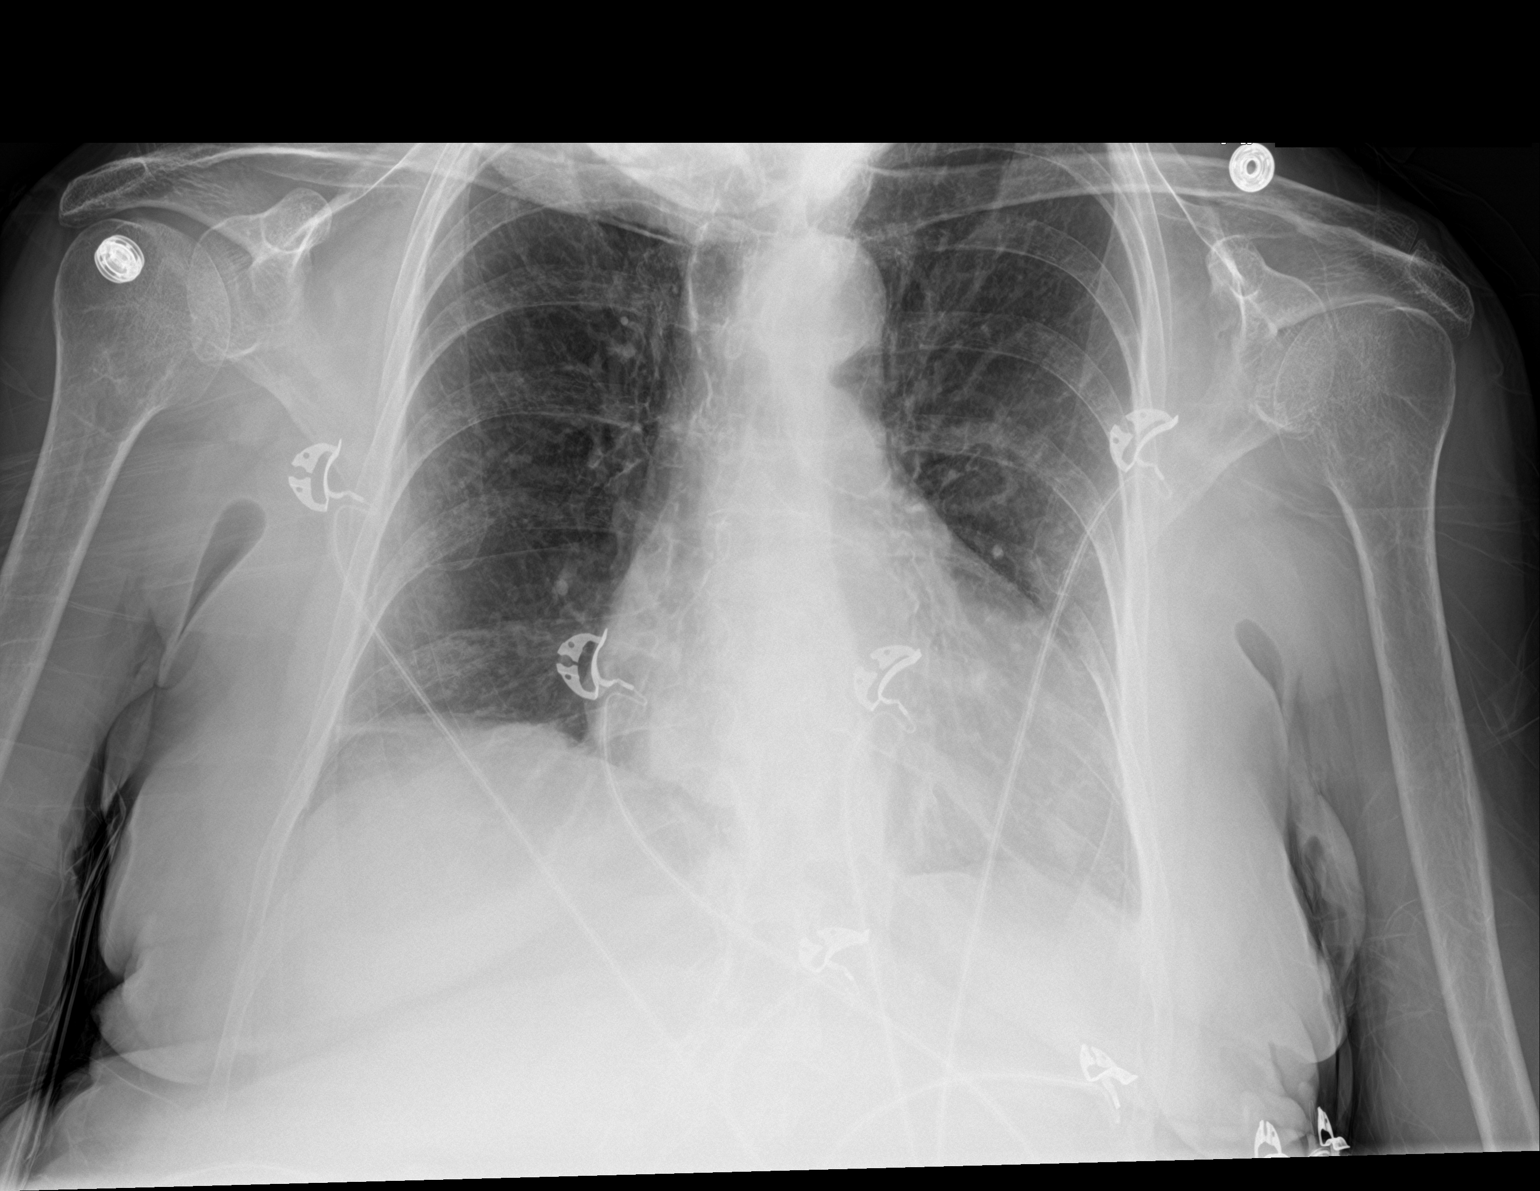

[chest lat]
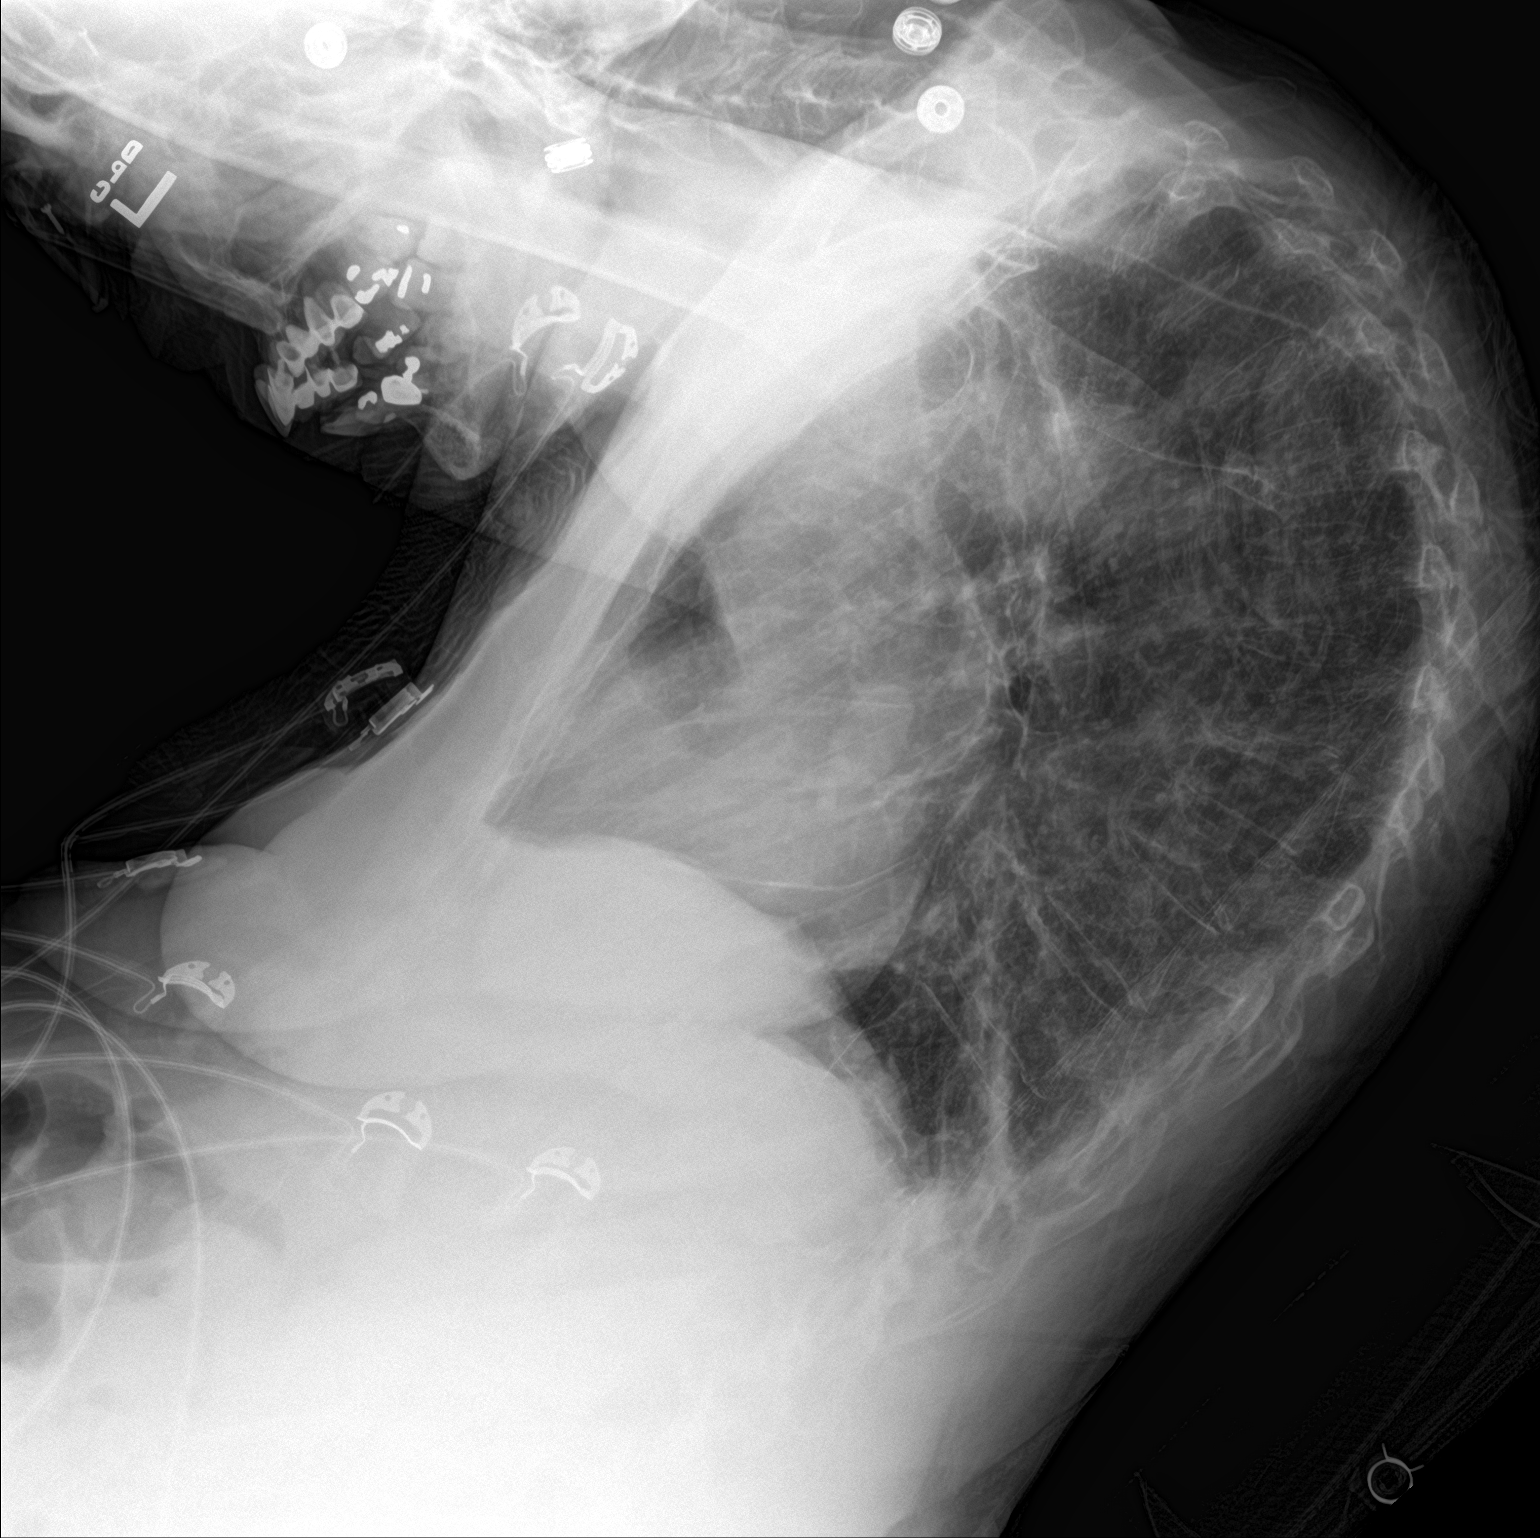

[2 of 2 positions shown; findings below may reference images not displayed]

FINDINGS: Shallow inspiration with linear atelectasis in the right lung base.
Cardiac enlargement. No vascular congestion, edema, or
consolidation. No blunting of costophrenic angles. No pneumothorax.
Degenerative changes in the spine with thoracic kyphosis and
midthoracic vertebral compression deformities, unchanged since
12/20/2014. Calcification of the aorta. Degenerative changes in the
shoulders.
IMPRESSION: Shallow inspiration with linear atelectasis in the right lung base.
Cardiac enlargement. No focal consolidation or edema.

## 2020-05-23 ENCOUNTER — Encounter: Payer: Self-pay | Admitting: Adult Health

## 2020-05-23 ENCOUNTER — Non-Acute Institutional Stay (SKILLED_NURSING_FACILITY): Payer: Medicare Other | Admitting: Adult Health

## 2020-05-23 DIAGNOSIS — F0391 Unspecified dementia with behavioral disturbance: Secondary | ICD-10-CM | POA: Diagnosis not present

## 2020-05-23 DIAGNOSIS — E440 Moderate protein-calorie malnutrition: Secondary | ICD-10-CM

## 2020-05-23 DIAGNOSIS — I872 Venous insufficiency (chronic) (peripheral): Secondary | ICD-10-CM | POA: Diagnosis not present

## 2020-05-23 DIAGNOSIS — F03918 Unspecified dementia, unspecified severity, with other behavioral disturbance: Secondary | ICD-10-CM

## 2020-05-23 NOTE — Progress Notes (Signed)
Location:    [facility] Room Number: 102/P Place of Service:  SNF (31)   CODE STATUS: DNR  Allergies  Allergen Reactions  . Contrast Media [Iodinated Diagnostic Agents] Anaphylaxis and Shortness Of Breath  . Vancomycin Shortness Of Breath  . Vioxx [Rofecoxib] Other (See Comments)    REACTION: irregular heartbeat    Chief Complaint  Patient presents with  . Medical Management of Chronic Issues          Venous (peripheral) insufficiency:    Protein calorie malnutrition:    Psychosis in elderly with behavioral disturbance    HPI:  She is a 85 year old long term resident of this facility being seen for the management of her chronic illnesses; Venous (peripheral) insufficiency:    Protein calorie malnutrition:    Psychosis in elderly with behavioral disturbance. There are no reports of uncontrolled pain no reports of changes in appetite; no reports of insomnia.   Past Medical History:  Diagnosis Date  . CHF (congestive heart failure) (HCC)   . History of pulmonary embolism   . Parkinson's disease (HCC)   . Venous stasis     History reviewed. No pertinent surgical history.  Social History   Socioeconomic History  . Marital status: Single    Spouse name: Not on file  . Number of children: Not on file  . Years of education: Not on file  . Highest education level: Not on file  Occupational History  . Not on file  Tobacco Use  . Smoking status: Never Smoker  . Smokeless tobacco: Never Used  Substance and Sexual Activity  . Alcohol use: No  . Drug use: No  . Sexual activity: Not on file  Other Topics Concern  . Not on file  Social History Narrative  . Not on file   Social Determinants of Health   Financial Resource Strain:   . Difficulty of Paying Living Expenses: Not on file  Food Insecurity:   . Worried About Programme researcher, broadcasting/film/video in the Last Year: Not on file  . Ran Out of Food in the Last Year: Not on file  Transportation Needs:   . Lack  of Transportation (Medical): Not on file  . Lack of Transportation (Non-Medical): Not on file  Physical Activity:   . Days of Exercise per Week: Not on file  . Minutes of Exercise per Session: Not on file  Stress:   . Feeling of Stress : Not on file  Social Connections:   . Frequency of Communication with Friends and Family: Not on file  . Frequency of Social Gatherings with Friends and Family: Not on file  . Attends Religious Services: Not on file  . Active Member of Clubs or Organizations: Not on file  . Attends Banker Meetings: Not on file  . Marital Status: Not on file  Intimate Partner Violence:   . Fear of Current or Ex-Partner: Not on file  . Emotionally Abused: Not on file  . Physically Abused: Not on file  . Sexually Abused: Not on file   Family History  Problem Relation Age of Onset  . Heart disease Mother        7s      VITAL SIGNS BP 124/69   Pulse 69   Temp 98 F (36.7 C)   Resp 19   Ht 5\' 2"  (1.575 m)   Wt 130 lb 9.6 oz (59.2 kg)   BMI 23.89 kg/m   Outpatient Encounter Medications  as of 05/23/2020  Medication Sig  . acetaminophen (TYLENOL) 325 MG tablet Take 2 tablets (650 mg total) by mouth every 6 (six) hours as needed for mild pain, fever or headache (or Fever >/= 101).  . cetaphil (CETAPHIL) lotion Apply 1 application topically daily as needed for dry skin. Use Sparingly to face  . NON FORMULARY Diet NAS, thin liquids  . nystatin cream (MYCOSTATIN) Apply 1 application topically 3 (three) times daily as needed. Special Instructions:apply to rash under right side of neck  . Pramox-PE-Glycerin-Petrolatum (HEMORRHOIDAL EX) Apply 1 application topically 2 (two) times daily as needed (Hemorrhoids).  . rivaroxaban (XARELTO) 20 MG TABS tablet Take 1 tablet (20 mg total) by mouth daily with supper.   No facility-administered encounter medications on file as of 05/23/2020.     SIGNIFICANT DIAGNOSTIC EXAMS   LABS REVIEWED PREVIOUS     07-13-19: wbc 8.9; hgb 13.4; hct 42.7; mcv 89.5 plt 203; glucose 115; bun 14; creat 0.49; k+ 3.7; na++ 140; ca 8.5 urine culure no growth 07-19-19; wbc 6.0; hgb 12.6; hct 41.0. mcv 90.7 plt 216; glucose 79; bun 19; creat 0.58; k+ 4.4 na++ 137; ca 8.6 08-18-19 urine culture: <10,000  09-11-19: urine culture: multiple species 09-24-19: wbc 6.5; hgb 14.0; hct 44.3; mcv 89. 7plt 213; glucose 93; bun 18; creat 0.54; k+ 3.6; na++ 139; ca 9.2  TODAY  05-02-20: wbc 6.7; hgb 13.4; hct 43.5; mcv 92.6 plt 221; glucose 69; bun 28; creat 0.62; k+ 4.0; na++ 139; ca 9.1 liver normal albumin 3.7     Review of Systems  Reason unable to perform ROS: will decline     Physical Exam Constitutional:      General: She is not in acute distress.    Appearance: She is well-developed. She is not diaphoretic.  Neck:     Thyroid: No thyromegaly.  Cardiovascular:     Rate and Rhythm: Normal rate and regular rhythm.     Pulses: Normal pulses.     Heart sounds: Normal heart sounds.  Pulmonary:     Effort: Pulmonary effort is normal. No respiratory distress.     Breath sounds: Normal breath sounds.  Abdominal:     General: Bowel sounds are normal. There is no distension.     Palpations: Abdomen is soft.     Tenderness: There is no abdominal tenderness.  Musculoskeletal:     Cervical back: Neck supple.     Right lower leg: No edema.     Left lower leg: No edema.     Comments: Neck is weak has contracture  Is able to move all extremities right extremity with ataxic movements    Lymphadenopathy:     Cervical: No cervical adenopathy.  Skin:    General: Skin is warm and dry.  Neurological:     Mental Status: She is alert. Mental status is at baseline.  Psychiatric:        Mood and Affect: Mood normal.      ASSESSMENT/ PLAN:  TODAY;   1. Venous (peripheral) insufficiency: is without change will monitor   2. Protein calorie malnutrition: weight is stable at 130 pounds; will continue to monitor at times  will decline meals. Albumin 3.7  3. Psychosis in elderly with behavioral disturbance she is without change will have episodes of yelling out; hallucinations; yells at staff and will throw stuff    PREVIOUS  4. Dementia with psychosis: is without change: weight is 130 pounds is currently not on medications will monitor her  status.   5. Chronic pulmonary embolism without acute cor pulmonale unspecified pulmonary embolism type: is stable is on long term xarelto 20 mg daily will decline this medication at times.   6. Parkinson's disease  without change will monitor       MD is aware of resident's narcotic use and is in agreement with current plan of care. We will attempt to wean resident as appropriate.  Synthia Innocent NP [facility]  Contact [phone] Monday through Friday 8am- 5pm  After hours call [phone]

## 2020-06-26 ENCOUNTER — Encounter: Payer: Self-pay | Admitting: Adult Health

## 2020-06-26 ENCOUNTER — Non-Acute Institutional Stay (SKILLED_NURSING_FACILITY): Payer: Medicare Other | Admitting: Adult Health

## 2020-06-26 DIAGNOSIS — G20 Parkinson's disease: Secondary | ICD-10-CM | POA: Diagnosis not present

## 2020-06-26 DIAGNOSIS — F0392 Unspecified dementia, unspecified severity, with psychotic disturbance: Secondary | ICD-10-CM

## 2020-06-26 DIAGNOSIS — F0391 Unspecified dementia with behavioral disturbance: Secondary | ICD-10-CM | POA: Diagnosis not present

## 2020-06-26 DIAGNOSIS — I2782 Chronic pulmonary embolism: Secondary | ICD-10-CM | POA: Diagnosis not present

## 2020-06-26 NOTE — Progress Notes (Signed)
Location:  [facility] Room Number: 102/P Place of Service:  SNF (31)   CODE STATUS: DNR  Allergies  Allergen Reactions  . Contrast Media [Iodinated Diagnostic Agents] Anaphylaxis and Shortness Of Breath  . Vancomycin Shortness Of Breath  . Vioxx [Rofecoxib] Other (See Comments)    REACTION: irregular heartbeat    Chief Complaint  Patient presents with  . Medical Management of Chronic Issues          Dementia with psychosis:    Chronic pulmonary embolism without acute cor pulmonale unspecified pulmonary embolism type:   Parkinson's disease:     HPI:  She is a 86 year old long term resident of this facility being seen for the management of her chronic illnesses: Dementia with psychosis:    Chronic pulmonary embolism without acute cor pulmonale unspecified pulmonary embolism type:   Parkinson's disease. She continues to have episodes of agitation; anger; will decline meals at time.   Past Medical History:  Diagnosis Date  . CHF (congestive heart failure) (HCC)   . History of pulmonary embolism   . Parkinson's disease (HCC)   . Venous stasis     History reviewed. No pertinent surgical history.  Social History   Socioeconomic History  . Marital status: Single    Spouse name: Not on file  . Number of children: Not on file  . Years of education: Not on file  . Highest education level: Not on file  Occupational History  . Not on file  Tobacco Use  . Smoking status: Never Smoker  . Smokeless tobacco: Never Used  Substance and Sexual Activity  . Alcohol use: No  . Drug use: No  . Sexual activity: Not on file  Other Topics Concern  . Not on file  Social History Narrative  . Not on file   Social Determinants of Health   Financial Resource Strain: Not on file  Food Insecurity: Not on file  Transportation Needs: Not on file  Physical Activity: Not on file  Stress: Not on file  Social Connections: Not on file  Intimate Partner Violence: Not  on file   Family History  Problem Relation Age of Onset  . Heart disease Mother        58s      VITAL SIGNS BP (!) 129/57   Pulse 77   Temp 97.6 F (36.4 C)   Resp 19   Ht 5\' 2"  (1.575 m)   Wt 127 lb 9.6 oz (57.9 kg)   BMI 23.34 kg/m   Outpatient Encounter Medications as of 06/26/2020  Medication Sig  . acetaminophen (TYLENOL) 325 MG tablet Take 2 tablets (650 mg total) by mouth every 6 (six) hours as needed for mild pain, fever or headache (or Fever >/= 101).  . NON FORMULARY Diet NAS, thin liquids  . nystatin cream (MYCOSTATIN) Apply 1 application topically 3 (three) times daily as needed. Special Instructions:apply to rash under right side of neck  . Pramox-PE-Glycerin-Petrolatum (HEMORRHOIDAL EX) Apply 1 application topically 2 (two) times daily as needed (Hemorrhoids).  . rivaroxaban (XARELTO) 20 MG TABS tablet Take 1 tablet (20 mg total) by mouth daily with supper.  . [DISCONTINUED] cetaphil (CETAPHIL) lotion Apply 1 application topically daily as needed for dry skin. Use Sparingly to face   No facility-administered encounter medications on file as of 06/26/2020.     SIGNIFICANT DIAGNOSTIC EXAMS   LABS REVIEWED PREVIOUS    07-13-19: wbc 8.9; hgb 13.4; hct 42.7; mcv 89.5 plt 203;  glucose 115; bun 14; creat 0.49; k+ 3.7; na++ 140; ca 8.5 urine culure no growth 07-19-19; wbc 6.0; hgb 12.6; hct 41.0. mcv 90.7 plt 216; glucose 79; bun 19; creat 0.58; k+ 4.4 na++ 137; ca 8.6 08-18-19 urine culture: <10,000  09-11-19: urine culture: multiple species 09-24-19: wbc 6.5; hgb 14.0; hct 44.3; mcv 89. 7plt 213; glucose 93; bun 18; creat 0.54; k+ 3.6; na++ 139; ca 9.2 05-02-20: wbc 6.7; hgb 13.4; hct 43.5; mcv 92.6 plt 221; glucose 69; bun 28; creat 0.62; k+ 4.0; na++ 139; ca 9.1 liver normal albumin 3.7    NO NEW LABS.    Review of Systems  Reason unable to perform ROS: will not participate       Physical Exam Constitutional:      General: She is not in acute distress.     Appearance: She is well-developed and well-nourished. She is not diaphoretic.  Neck:     Thyroid: No thyromegaly.  Cardiovascular:     Rate and Rhythm: Normal rate and regular rhythm.     Pulses: Normal pulses and intact distal pulses.     Heart sounds: Normal heart sounds.  Pulmonary:     Effort: Pulmonary effort is normal. No respiratory distress.     Breath sounds: Normal breath sounds.  Abdominal:     General: Bowel sounds are normal. There is no distension.     Palpations: Abdomen is soft.     Tenderness: There is no abdominal tenderness.  Musculoskeletal:        General: No edema.     Cervical back: Neck supple.     Right lower leg: No edema.     Left lower leg: No edema.     Comments: Neck is weak has contracture  Is able to move all extremities right extremity with ataxic movements   Lymphadenopathy:     Cervical: No cervical adenopathy.  Skin:    General: Skin is warm and dry.  Neurological:     Mental Status: She is alert. Mental status is at baseline.  Psychiatric:        Mood and Affect: Mood and affect and mood normal.     ASSESSMENT/ PLAN:  TODAY;   1. Dementia with psychosis: is without change weight is 127 pounds; a loss of 3 pounds over the past month. Will monitor  2. Chronic pulmonary embolism without acute cor pulmonale unspecified pulmonary embolism type: is stable is on long term xarelto 20 mg daily will decline at times  3. Parkinson's disease: is without change she has declined a neurology appointment.   PREVIOUS  4. Venous (peripheral) insufficiency: is without change will monitor   5. Protein calorie malnutrition: weight is stable at 127 pounds; will continue to monitor at times will decline meals. Albumin 3.7  6. Psychosis in elderly with behavioral disturbance she is without change will have episodes of yelling out; hallucinations; yells at staff and will throw stuff    MD is aware of resident's narcotic use and is in agreement with  current plan of care. We will attempt to wean resident as appropriate.  Synthia Innocent NP [facility]  Contact [phone] Monday through Friday 8am- 5pm  After hours call [phone]

## 2020-07-09 ENCOUNTER — Encounter (HOSPITAL_COMMUNITY)
Admission: AD | Admit: 2020-07-09 | Discharge: 2020-07-09 | Disposition: A | Discharge status: Home / Self Care | Payer: Medicare Other | Source: Skilled Nursing Facility | Attending: Adult Health | Admitting: Adult Health

## 2020-07-09 DIAGNOSIS — N3001 Acute cystitis with hematuria: Secondary | ICD-10-CM | POA: Diagnosis present

## 2020-07-09 LAB — URINALYSIS, ROUTINE W REFLEX MICROSCOPIC
Bilirubin Urine: NEGATIVE
Glucose, UA: NEGATIVE mg/dL
Ketones, ur: NEGATIVE mg/dL
Nitrite: POSITIVE — AB
Protein, ur: >300 mg/dL — AB
Specific Gravity, Urine: >1.03 — ABNORMAL HIGH (ref 1.005–1.030)
pH: 6.5 (ref 5.0–8.0)

## 2020-07-09 LAB — URINALYSIS, MICROSCOPIC (REFLEX): RBC / HPF: >50 RBC/hpf (ref 0–5)

## 2020-07-11 LAB — URINE CULTURE: Culture: NO GROWTH

## 2020-07-18 ENCOUNTER — Non-Acute Institutional Stay (SKILLED_NURSING_FACILITY): Payer: Medicare Other | Admitting: Adult Health

## 2020-07-18 ENCOUNTER — Encounter: Payer: Self-pay | Admitting: Adult Health

## 2020-07-18 DIAGNOSIS — F0392 Unspecified dementia, unspecified severity, with psychotic disturbance: Secondary | ICD-10-CM

## 2020-07-18 DIAGNOSIS — G20 Parkinson's disease: Secondary | ICD-10-CM | POA: Diagnosis not present

## 2020-07-18 DIAGNOSIS — I5032 Chronic diastolic (congestive) heart failure: Secondary | ICD-10-CM | POA: Diagnosis not present

## 2020-07-18 DIAGNOSIS — F0391 Unspecified dementia with behavioral disturbance: Secondary | ICD-10-CM

## 2020-07-18 NOTE — Progress Notes (Signed)
Location:  [facility] Room Number: 102/P Place of Service:  SNF (31)   CODE STATUS: DNR  Allergies  Allergen Reactions  . Contrast Media [Iodinated Diagnostic Agents] Anaphylaxis and Shortness Of Breath  . Vancomycin Shortness Of Breath  . Vioxx [Rofecoxib] Other (See Comments)    REACTION: irregular heartbeat    Chief Complaint  Patient presents with  . Acute Visit    Care Plan Meeting     HPI:  We have come together for her care plan meeting. No BIMS; mood 3/30. She continues to be followed by hospice care. She is extensive assist to dependent for her adls. She is incontinent of bladder and bowel. She is nonambulatory. She requires assistance with eating. She does get out of bed on occasions  She has not had any falls. Her weight is 127.6 pounds she has lost 12 pounds over the past year. She will decline her xarelto 3-4 time weekly. Her right knee is red and swollen and  painful. She yells out nearly every day will decline care is verbally aggressive with staff. Dr Alwyn Ren and I have spoken about this; she will need yet another psych consult. She has declined 3 in the past. She continues to be followed for her chronic illnesses including: Chronic diastolic CHF (congestive heart failure)  Parkinson's disease Dementia with psychosis  Past Medical History:  Diagnosis Date  . CHF (congestive heart failure) (HCC)   . History of pulmonary embolism   . Parkinson's disease (HCC)   . Venous stasis     History reviewed. No pertinent surgical history.  Social History   Socioeconomic History  . Marital status: Single    Spouse name: Not on file  . Number of children: Not on file  . Years of education: Not on file  . Highest education level: Not on file  Occupational History  . Not on file  Tobacco Use  . Smoking status: Never Smoker  . Smokeless tobacco: Never Used  Substance and Sexual Activity  . Alcohol use: No  . Drug use: No  . Sexual activity:  Not on file  Other Topics Concern  . Not on file  Social History Narrative  . Not on file   Social Determinants of Health   Financial Resource Strain: Not on file  Food Insecurity: Not on file  Transportation Needs: Not on file  Physical Activity: Not on file  Stress: Not on file  Social Connections: Not on file  Intimate Partner Violence: Not on file   Family History  Problem Relation Age of Onset  . Heart disease Mother        83s      VITAL SIGNS BP 117/70   Pulse 74   Temp 97.9 F (36.6 C)   Ht 5\' 2"  (1.575 m)   Wt 127 lb 9.6 oz (57.9 kg)   BMI 23.34 kg/m   Outpatient Encounter Medications as of 07/18/2020  Medication Sig  . acetaminophen (TYLENOL) 325 MG tablet Take 2 tablets (650 mg total) by mouth every 6 (six) hours as needed for mild pain, fever or headache (or Fever >/= 101).  . NON FORMULARY Diet NAS, thin liquids  . nystatin cream (MYCOSTATIN) Apply 1 application topically 3 (three) times daily as needed. Special Instructions:apply to rash under right side of neck  . Pramox-PE-Glycerin-Petrolatum (HEMORRHOIDAL EX) Apply 1 application topically 2 (two) times daily as needed (Hemorrhoids).  . rivaroxaban (XARELTO) 20 MG TABS tablet Take 1 tablet (20 mg total)  by mouth daily with supper.   No facility-administered encounter medications on file as of 07/18/2020.     SIGNIFICANT DIAGNOSTIC EXAMS  LABS REVIEWED PREVIOUS    07-13-19: wbc 8.9; hgb 13.4; hct 42.7; mcv 89.5 plt 203; glucose 115; bun 14; creat 0.49; k+ 3.7; na++ 140; ca 8.5 urine culure no growth 07-19-19; wbc 6.0; hgb 12.6; hct 41.0. mcv 90.7 plt 216; glucose 79; bun 19; creat 0.58; k+ 4.4 na++ 137; ca 8.6 08-18-19 urine culture: <10,000  09-11-19: urine culture: multiple species 09-24-19: wbc 6.5; hgb 14.0; hct 44.3; mcv 89. 7plt 213; glucose 93; bun 18; creat 0.54; k+ 3.6; na++ 139; ca 9.2 05-02-20: wbc 6.7; hgb 13.4; hct 43.5; mcv 92.6 plt 221; glucose 69; bun 28; creat 0.62; k+ 4.0; na++ 139; ca  9.1 liver normal albumin 3.7    NO NEW LABS.   Review of Systems  Reason unable to perform ROS: will not participate     Physical Exam Constitutional:      General: She is not in acute distress.    Appearance: She is underweight and well-nourished. She is not diaphoretic.  Neck:     Thyroid: No thyromegaly.  Cardiovascular:     Rate and Rhythm: Normal rate and regular rhythm.     Pulses: Normal pulses and intact distal pulses.     Heart sounds: Normal heart sounds.  Pulmonary:     Effort: Pulmonary effort is normal. No respiratory distress.     Breath sounds: Normal breath sounds.  Abdominal:     General: Bowel sounds are normal. There is no distension.     Palpations: Abdomen is soft.     Tenderness: There is no abdominal tenderness.  Musculoskeletal:        General: No edema.     Cervical back: Neck supple.     Right lower leg: No edema.     Left lower leg: No edema.     Comments: Neck is weak has contracture  Is able to move all extremities right extremity with ataxic movements   Lymphadenopathy:     Cervical: No cervical adenopathy.  Skin:    General: Skin is warm and dry.  Neurological:     Mental Status: She is alert. Mental status is at baseline.  Psychiatric:        Mood and Affect: Mood and affect and mood normal.        ASSESSMENT/ PLAN:  TODAY  1. Chronic diastolic CHF (congestive heart failure)  2. Parkinson's disease 3. Dementia with psychosis   Will continue current medicatoins Will setup another psych consult   Will continue hospice services Will continue to monitor her status     MD is aware of resident's narcotic use and is in agreement with current plan of care. We will attempt to wean resident as appropriate.  Synthia Innocent NP [facility]  Contact [phone] Monday through Friday 8am- 5pm  After hours call [phone]

## 2020-07-29 ENCOUNTER — Encounter: Payer: Self-pay | Admitting: Adult Health

## 2020-07-29 ENCOUNTER — Non-Acute Institutional Stay (SKILLED_NURSING_FACILITY): Payer: Medicare Other | Admitting: Adult Health

## 2020-07-29 DIAGNOSIS — I872 Venous insufficiency (chronic) (peripheral): Secondary | ICD-10-CM | POA: Diagnosis not present

## 2020-07-29 DIAGNOSIS — E440 Moderate protein-calorie malnutrition: Secondary | ICD-10-CM

## 2020-07-29 DIAGNOSIS — I2782 Chronic pulmonary embolism: Secondary | ICD-10-CM

## 2020-07-29 DIAGNOSIS — F03918 Unspecified dementia, unspecified severity, with other behavioral disturbance: Secondary | ICD-10-CM

## 2020-07-29 DIAGNOSIS — G20A1 Parkinson's disease without dyskinesia, without mention of fluctuations: Secondary | ICD-10-CM

## 2020-07-29 DIAGNOSIS — I5032 Chronic diastolic (congestive) heart failure: Secondary | ICD-10-CM | POA: Diagnosis not present

## 2020-07-29 DIAGNOSIS — F0391 Unspecified dementia with behavioral disturbance: Secondary | ICD-10-CM

## 2020-07-29 DIAGNOSIS — G20 Parkinson's disease: Secondary | ICD-10-CM

## 2020-07-29 DIAGNOSIS — F0392 Unspecified dementia, unspecified severity, with psychotic disturbance: Secondary | ICD-10-CM

## 2020-07-29 NOTE — Progress Notes (Signed)
Provider: Location    PCP: Sharee Holster, NP @[email or web address] @  Extended Emergency Contact Information Primary Emergency Contact: [place] Home Phone: [phone] Relation: Sister Secondary Emergency Contact: [phone] Home Phone: [phone] Relation: Sister  Codes status:  Goals of care: advanced directive information Advanced Directives 07/29/2020  Does Patient Have a Medical Advance Directive? Yes  Type of Advance Directive Out of facility DNR (pink MOST or yellow form)  Does patient want to make changes to medical advance directive? No - Patient declined  Copy of Healthcare Power of Attorney in Chart? -  Would patient like information on creating a medical advance directive? -  Pre-existing out of facility DNR order (yellow form or pink MOST form) Yellow form placed in chart (order not valid for inpatient use)     Allergies  Allergen Reactions  . Contrast Media [Iodinated Diagnostic Agents] Anaphylaxis and Shortness Of Breath  . Vancomycin Shortness Of Breath  . Vioxx [Rofecoxib] Other (See Comments)    REACTION: irregular heartbeat   Chief Complaint  Patient presents with  . Annual Exam   HPI  She is a 86 year old long term resident of this facility being seen for her annual exam. She is now followed by hospice care. She has not been hospitalized over the past year. She weight is stable at this time. She does have periods of agitation. Her pain is under control at this time. She is sleeping at night. She will decline medications at times. She continues to be followed for her chronic illnesses including: Venous (peripheral) insufficiency:  Protein calorie malnutrition:  Psychosis in elderly with behavioral disturbance   Past Medical History:  Diagnosis Date  . CHF (congestive heart failure) (HCC)   . History of pulmonary embolism   . Parkinson's disease (HCC)   . Venous stasis    History reviewed. No  pertinent surgical history.  reports that she has never smoked. She has never used smokeless tobacco. She reports that she does not drink alcohol and does not use drugs. Social History   Tobacco Use  . Smoking status: Never Smoker  . Smokeless tobacco: Never Used  Substance Use Topics  . Alcohol use: No  . Drug use: No   Family History  Problem Relation Age of Onset  . Heart disease Mother        57s    Pertinent  Health Maintenance Due  Topic Date Due  . INFLUENZA VACCINE  09/19/2020 (Originally 01/21/2020)  . PNA vac Low Risk Adult (2 of 2 - PCV13) 04/23/2021 (Originally 06/22/2008)  . DEXA SCAN  Discontinued   Fall Risk  02/05/2020  Falls in the past year? 0  Number falls in past yr: 0  Injury with Fall? 0  Risk for fall due to : Impaired balance/gait;Impaired mobility  Follow up Falls evaluation completed   No flowsheet data found.  Functional Status Survey:    Outpatient Encounter Medications as of 07/29/2020  Medication Sig  . acetaminophen (TYLENOL) 325 MG tablet Take 2 tablets (650 mg total) by mouth every 6 (six) hours as needed for mild pain, fever or headache (or Fever >/= 101).  09/26/2020 [place]-Castor Oil (VENELEX) OINT Apply 1 application topically 3 (three) times daily. Special Instructions: Apply to bilateral buttocks qshift  . NON FORMULARY Diet NAS, thin liquids  . nystatin cream (MYCOSTATIN) Apply 1 application topically 3 (three) times daily as needed. Special Instructions:apply to rash under right side of neck  .  Pramox-PE-Glycerin-Petrolatum (HEMORRHOIDAL EX) Apply 1 application topically 2 (two) times daily as needed (Hemorrhoids).  . rivaroxaban (XARELTO) 20 MG TABS tablet Take 1 tablet (20 mg total) by mouth daily with supper.   No facility-administered encounter medications on file as of 07/29/2020.     Vitals:   07/29/20 0910  BP: 131/76  Pulse: 78  Temp: 97.9 F (36.6 C)  Weight: 128 lb (58.1 kg)  Height: 5\' 2"  (1.575 m)   Body mass index is  23.41 kg/m.  DIAGNOSTIC EXAMS   LABS REVIEWED PREVIOUS    08-18-19 urine culture: <10,000  09-11-19: urine culture: multiple species 09-24-19: wbc 6.5; hgb 14.0; hct 44.3; mcv 89. 7plt 213; glucose 93; bun 18; creat 0.54; k+ 3.6; na++ 139; ca 9.2 05-02-20: wbc 6.7; hgb 13.4; hct 43.5; mcv 92.6 plt 221; glucose 69; bun 28; creat 0.62; k+ 4.0; na++ 139; ca 9.1 liver normal albumin 3.7    TODAY;   07-09-20: urine culture no growth    Review of Systems  Reason unable to perform ROS: does not participate     Physical Exam Constitutional:      General: She is not in acute distress.    Appearance: She is well-developed and well-nourished. She is not diaphoretic.     Comments: thin  HENT:     Right Ear: Tympanic membrane normal.     Left Ear: Tympanic membrane normal.     Nose: Nose normal.     Mouth/Throat:     Mouth: Mucous membranes are moist.     Pharynx: Oropharynx is clear.  Eyes:     Conjunctiva/sclera: Conjunctivae normal.  Neck:     Thyroid: No thyromegaly.  Cardiovascular:     Rate and Rhythm: Normal rate and regular rhythm.     Pulses: Normal pulses and intact distal pulses.     Heart sounds: Normal heart sounds.  Pulmonary:     Effort: Pulmonary effort is normal. No respiratory distress.     Breath sounds: Normal breath sounds.  Abdominal:     General: Bowel sounds are normal. There is no distension.     Palpations: Abdomen is soft.     Tenderness: There is no abdominal tenderness.  Musculoskeletal:        General: No edema.     Cervical back: Neck supple.     Right lower leg: No edema.     Left lower leg: No edema.     Comments:  Neck is weak has contracture  Is able to move all extremities right extremity with ataxic movements   Lymphadenopathy:     Cervical: No cervical adenopathy.  Skin:    General: Skin is warm and dry.  Neurological:     Mental Status: She is alert. Mental status is at baseline.  Psychiatric:        Mood and Affect: Mood and affect  and mood normal.       ASSESSMENT/ PLAN:  TODAY;   1. Venous (peripheral) insufficiency: is without change will monitor  2. Protein calorie malnutrition: weight is 128 pounds; she will decline meals at times; will not take supplements; albumin is 3.7   3. Psychosis in elderly with behavioral disturbance: is without change in status; she continues to have periods of time where she yells out; scream; calls staff names at times; will not take medications to help her mood stabilization.   4. Dementia with psychosis is without change weight is 128 pounds her weight is stable this past month. Will  monitor  5. Chronic pulmonary embolism without acte cor pulmonale unspecified pulmonary embolism type: is stable is on long term xarelto 20 mg daily she will decline this medication at times  6. Parkinson's disease: is without change; she has declined neurology work up.   7. Chronic diastolic CHF (congestive heart failure): her status is stable; she is presently compensated.    MD is aware of resident's narcotic use and is in agreement with current plan of care. We will attempt to wean resident as appropriate.  Synthia Innocent NP [facility]  Contact [phone] Monday through Friday 8am- 5pm  After hours call [phone]

## 2020-08-28 ENCOUNTER — Non-Acute Institutional Stay (SKILLED_NURSING_FACILITY): Payer: Medicare Other | Admitting: Internal Medicine

## 2020-08-28 ENCOUNTER — Encounter: Payer: Self-pay | Admitting: Internal Medicine

## 2020-08-28 DIAGNOSIS — E440 Moderate protein-calorie malnutrition: Secondary | ICD-10-CM

## 2020-08-28 DIAGNOSIS — I2782 Chronic pulmonary embolism: Secondary | ICD-10-CM

## 2020-08-28 DIAGNOSIS — E538 Deficiency of other specified B group vitamins: Secondary | ICD-10-CM

## 2020-08-28 DIAGNOSIS — I5032 Chronic diastolic (congestive) heart failure: Secondary | ICD-10-CM | POA: Diagnosis not present

## 2020-08-28 NOTE — Assessment & Plan Note (Signed)
05/02/2020 no anemia or macrocytosis present.

## 2020-08-28 NOTE — Progress Notes (Signed)
   NURSING HOME LOCATION:  [facility] ROOM NUMBER:  [id number]  CODE STATUS:  DNR  PCP:  Synthia Innocent NP  This is a nursing facility follow up visit of chronic medical diagnoses & to document compliance with Regulation 483.30 (c) in The Long Term Care Survey Manual Phase 2 which mandates caregiver visit ( visits can alternate among physician, PA or NP as per statutes) within 10 days of 30 days / 60 days/ 90 days post admission to SNF date    Interim medical record and care since last [facility] visit was updated with review of diagnostic studies and change in clinical status since last visit were documented.  HPI: She is a permanent resident of the facility with medical diagnoses of Parkinson's disease, peripheral venous insufficiency, chronic diastolic congestive heart failure, dementia with psychoses, and history of PTE.  Review of systems: Dementia invalidated responses.  Initially she resisted interview and exam.  She seemed very suspicious wanting to know "where are you from ?".  She validated most symptoms queried including paroxysmal nocturnal dyspnea & heart racing. Her main focus was pain in the left knee.  Initially she stated that she had hit her knee recently but then she stated that the "maid did it". When I asked whether the pain were "sharp, dull, burning or throbbing"; her response was "all of the above".  Physical exam:  Pertinent or positive findings: She appears chronically ill.  She lies in bed with her head flexed anteriorly and to the right due to cervical muscular contractions (torticollis).  Slight exotropia of the right eye was suggested.  She is missing maxillary teeth.  Rosacea type rash is present over the left cheek.  Breath sounds are decreased. There was no significant tachycardia.  Lower extremities are elevated.  There was no edema.  Pedal pulses are decreased except for the left dorsalis pedis pulse which is palpable.  There is faint  hyperpigmentation and dry, scaly dermatitis over the shins.  Fusiform changes are present of the left knee. I cannot appreciate effusion.  She has severe interosseous wasting of the hands.  She exhibits constant tremor activity with her hands, right greater than left with a grasping type character.  General appearance: no acute distress, increased work of breathing is present.   Lymphatic: No lymphadenopathy about the head, neck, axilla. Eyes: No conjunctival inflammation or lid edema is present. There is no scleral icterus. Ears:  External ear exam shows no significant lesions or deformities.   Nose:  External nasal examination shows no deformity or inflammation. Nasal mucosa are pink and moist without lesions, exudates Oral exam:  Lips and gums are healthy appearing. There is no oropharyngeal erythema or exudate. Neck:  No thyromegaly, masses, tenderness noted.    Heart:  Normal rate and regular rhythm. S1 and S2 normal without gallop, murmur, click, rub .  Lungs:  without wheezes, rhonchi, rales, rubs. Abdomen: Bowel sounds are normal. Abdomen is soft and nontender with no organomegaly, hernias, masses. GU: Deferred  Extremities:  No cyanosis, clubbing, edema  Neurologic exam :Balance, Rhomberg, finger to nose testing could not be completed due to clinical state Skin: Warm & dry w/o tenting.  See summary under each active problem in the Problem List with associated updated therapeutic plan

## 2020-08-28 NOTE — Patient Instructions (Signed)
See assessment and plan under each diagnosis in the problem list and acutely for this visit 

## 2020-08-28 NOTE — Assessment & Plan Note (Signed)
05/02/2020 both albumin and total protein were within normal limits.

## 2020-08-28 NOTE — Assessment & Plan Note (Addendum)
Lifelong prophylaxis with novel anticoagulant indicated.  Unfortunately she will intermittently refuse to take the anticoagulant for several days at a time with elevated risk of recurrence.

## 2020-08-28 NOTE — Assessment & Plan Note (Addendum)
Clinically compensated at this time with no evidence of neck vein distention or hepatojugular reflux or significant peripheral edema despite her complaints of paroxysmal nocturnal dyspnea and "heart racing".

## 2020-09-27 ENCOUNTER — Non-Acute Institutional Stay (SKILLED_NURSING_FACILITY): Payer: Medicare Other | Admitting: Adult Health

## 2020-09-27 ENCOUNTER — Encounter: Payer: Self-pay | Admitting: Adult Health

## 2020-09-27 DIAGNOSIS — F0391 Unspecified dementia with behavioral disturbance: Secondary | ICD-10-CM

## 2020-09-27 DIAGNOSIS — E440 Moderate protein-calorie malnutrition: Secondary | ICD-10-CM | POA: Diagnosis not present

## 2020-09-27 DIAGNOSIS — I872 Venous insufficiency (chronic) (peripheral): Secondary | ICD-10-CM | POA: Diagnosis not present

## 2020-09-27 DIAGNOSIS — F03918 Unspecified dementia, unspecified severity, with other behavioral disturbance: Secondary | ICD-10-CM

## 2020-09-27 NOTE — Progress Notes (Signed)
Location:  [facility] Room Number: 102/P Place of Service:  SNF (31)   CODE STATUS: DNR  Allergies  Allergen Reactions  . Contrast Media [Iodinated Diagnostic Agents] Anaphylaxis and Shortness Of Breath  . Vancomycin Shortness Of Breath  . Vioxx [Rofecoxib] Other (See Comments)    REACTION: irregular heartbeat    Chief Complaint  Patient presents with  . Medical Management of Chronic Issues         Venous (peripheral) insufficiency: Protein calorie malnutrition:  Psychosis in elderly with behavioral disturbance     HPI:  She is a 86 year old long term resident of this facility being seen for the management of her chronic illnessesVenous (peripheral) insufficiency: Protein calorie malnutrition:  Psychosis in elderly with behavioral disturbance. She is not yelling out as frequently; her appetite is stable; there are no reports of uncontrolled pain.    Past Medical History:  Diagnosis Date  . CHF (congestive heart failure) (HCC)   . Chronic diastolic CHF (congestive heart failure) (HCC) 08/16/2009   Qualifier: Diagnosis of  By: Excell Seltzer, MD, [place]   . History of pulmonary embolism   . Parkinson's disease (HCC)   . Venous stasis     History reviewed. No pertinent surgical history.  Social History   Socioeconomic History  . Marital status: Single    Spouse name: Not on file  . Number of children: Not on file  . Years of education: Not on file  . Highest education level: Not on file  Occupational History  . Not on file  Tobacco Use  . Smoking status: Never Smoker  . Smokeless tobacco: Never Used  Substance and Sexual Activity  . Alcohol use: No  . Drug use: No  . Sexual activity: Not on file  Other Topics Concern  . Not on file  Social History Narrative  . Not on file   Social Determinants of Health   Financial Resource Strain: Not on file  Food Insecurity: Not on file  Transportation Needs: Not on file  Physical Activity: Not on  file  Stress: Not on file  Social Connections: Not on file  Intimate Partner Violence: Not on file   Family History  Problem Relation Age of Onset  . Heart disease Mother        19s      VITAL SIGNS BP 116/64   Pulse 68   Temp (!) 97.1 F (36.2 C)   Ht 5\' 2"  (1.575 m)   Wt 130 lb (59 kg)   BMI 23.78 kg/m   Outpatient Encounter Medications as of 09/27/2020  Medication Sig  . acetaminophen (TYLENOL) 325 MG tablet Take 2 tablets (650 mg total) by mouth every 6 (six) hours as needed for mild pain, fever or headache (or Fever >/= 101).  11/27/2020 [place]-Castor Oil (VENELEX) OINT Apply 1 application topically 3 (three) times daily. Special Instructions: Apply to bilateral buttocks qshift  . magnesium hydroxide (MILK OF MAGNESIA) 400 MG/5ML suspension Take 30 mLs by mouth daily as needed for mild constipation.  . NON FORMULARY Diet NAS, thin liquids  . nystatin cream (MYCOSTATIN) Apply 1 application topically 3 (three) times daily as needed. Special Instructions:apply to rash under right side of neck  . polyethylene glycol (MIRALAX / GLYCOLAX) 17 g packet Take 17 g by mouth 2 (two) times daily.  . Pramox-PE-Glycerin-Petrolatum (HEMORRHOIDAL EX) Apply 1 application topically 2 (two) times daily as needed (Hemorrhoids).  . rivaroxaban (XARELTO) 20 MG TABS tablet Take 1  tablet (20 mg total) by mouth daily with supper.   No facility-administered encounter medications on file as of 09/27/2020.     SIGNIFICANT DIAGNOSTIC EXAMS  LABS REVIEWED PREVIOUS    09-24-19: wbc 6.5; hgb 14.0; hct 44.3; mcv 89. 7plt 213; glucose 93; bun 18; creat 0.54; k+ 3.6; na++ 139; ca 9.2 05-02-20: wbc 6.7; hgb 13.4; hct 43.5; mcv 92.6 plt 221; glucose 69; bun 28; creat 0.62; k+ 4.0; na++ 139; ca 9.1 liver normal albumin 3.7   07-09-20: urine culture no growth   NO NEW LABS.    Review of Systems  Unable to perform ROS: Dementia (does not participate )    Physical Exam Constitutional:      General: She is not  in acute distress.    Appearance: She is well-developed. She is not diaphoretic.  Neck:     Thyroid: No thyromegaly.  Cardiovascular:     Rate and Rhythm: Normal rate and regular rhythm.     Pulses: Normal pulses.     Heart sounds: Normal heart sounds.  Pulmonary:     Effort: Pulmonary effort is normal. No respiratory distress.     Breath sounds: Normal breath sounds.  Abdominal:     General: Bowel sounds are normal. There is no distension.     Palpations: Abdomen is soft.     Tenderness: There is no abdominal tenderness.  Musculoskeletal:     Cervical back: Neck supple.     Right lower leg: No edema.     Left lower leg: No edema.     Comments: Neck is weak has contracture  Is able to move all extremities right extremity with ataxic movements    Lymphadenopathy:     Cervical: No cervical adenopathy.  Skin:    General: Skin is warm and dry.  Neurological:     Mental Status: She is alert. Mental status is at baseline.  Psychiatric:        Mood and Affect: Mood normal.      ASSESSMENT/ PLAN:  TODAY;   1. Venous (peripheral) insufficiency: is without change will monitor  2. Protein calorie malnutrition: weight is 130 pounds; she will decline meals at times; will not take supplements; albumin is 3.7   3. Psychosis in elderly with behavioral disturbance: is without change in status; she continues to have periods of time where she yells out; scream; calls staff names at times; will not take medications to help her mood stabilization.   PREVIOUS   4. Dementia with psychosis is without change weight is 128 pounds her weight is stable this past month. Will monitor  5. Chronic pulmonary embolism without acte cor pulmonale unspecified pulmonary embolism type: is stable is on long term xarelto 20 mg daily she will decline this medication at times  6. Parkinson's disease: is without change; she has declined neurology work up.   7. Chronic diastolic CHF (congestive heart failure):  her status is stable; she is presently compensated.     Synthia Innocent NP [facility] Adult Medicine  Contact [phone] Monday through Friday 8am- 5pm  After hours call [phone]

## 2020-10-17 ENCOUNTER — Encounter: Payer: Self-pay | Admitting: Adult Health

## 2020-10-17 ENCOUNTER — Non-Acute Institutional Stay (SKILLED_NURSING_FACILITY): Payer: Medicare Other | Admitting: Adult Health

## 2020-10-17 DIAGNOSIS — F0391 Unspecified dementia with behavioral disturbance: Secondary | ICD-10-CM

## 2020-10-17 DIAGNOSIS — E440 Moderate protein-calorie malnutrition: Secondary | ICD-10-CM | POA: Diagnosis not present

## 2020-10-17 DIAGNOSIS — I5032 Chronic diastolic (congestive) heart failure: Secondary | ICD-10-CM | POA: Diagnosis not present

## 2020-10-17 DIAGNOSIS — G20 Parkinson's disease: Secondary | ICD-10-CM | POA: Diagnosis not present

## 2020-10-17 DIAGNOSIS — F0392 Unspecified dementia, unspecified severity, with psychotic disturbance: Secondary | ICD-10-CM

## 2020-10-17 NOTE — Progress Notes (Signed)
Location:  [facility] Room Number: 222 Place of Service:  SNF (31)   CODE STATUS: dnr  Allergies  Allergen Reactions  . Contrast Media [Iodinated Diagnostic Agents] Anaphylaxis and Shortness Of Breath  . Vancomycin Shortness Of Breath  . Vioxx [Rofecoxib] Other (See Comments)    REACTION: irregular heartbeat    Chief Complaint  Patient presents with  . Acute Visit    Care plan meeting     HPI:  We have come together for her care plan meeting.  BIMS 10/15 mood 0/30. She is extensive assist with her adls. She is incontinent of bladder and bowel. She spends all of her time in bed per her choice. She requires assist with her meals. She is both physically and verbally aggressive with staff members. She frequently declines her xarelto. She does have hallucinations. Therapy none     Weight 130 pounds snacks all day; meal intake is good; regular . She continues to be followed for her chronic illnesses including:Parkinson's disease   Parkinson's dementia   Chronic diastolic CHF (congestive heart failure) Moderate protein calorie malnutrition   Past Medical History:  Diagnosis Date  . CHF (congestive heart failure) (HCC)   . Chronic diastolic CHF (congestive heart failure) (HCC) 08/16/2009   Qualifier: Diagnosis of  By: Excell Seltzer, MD, [place]   . History of pulmonary embolism   . Parkinson's disease (HCC)   . Venous stasis     No past surgical history on file.  Social History   Socioeconomic History  . Marital status: Single    Spouse name: Not on file  . Number of children: Not on file  . Years of education: Not on file  . Highest education level: Not on file  Occupational History  . Not on file  Tobacco Use  . Smoking status: Never Smoker  . Smokeless tobacco: Never Used  Substance and Sexual Activity  . Alcohol use: No  . Drug use: No  . Sexual activity: Not on file  Other Topics Concern  . Not on file  Social History Narrative  . Not on  file   Social Determinants of Health   Financial Resource Strain: Not on file  Food Insecurity: Not on file  Transportation Needs: Not on file  Physical Activity: Not on file  Stress: Not on file  Social Connections: Not on file  Intimate Partner Violence: Not on file   Family History  Problem Relation Age of Onset  . Heart disease Mother        64s      VITAL SIGNS BP (!) 153/71   Pulse 75   Temp 97.6 F (36.4 C)   Resp 18   Ht 5\' 2"  (1.575 m)   Wt 130 lb (59 kg)   BMI 23.78 kg/m   Outpatient Encounter Medications as of 10/17/2020  Medication Sig  . acetaminophen (TYLENOL) 325 MG tablet Take 2 tablets (650 mg total) by mouth every 6 (six) hours as needed for mild pain, fever or headache (or Fever >/= 101).  10/19/2020 [place]-Castor Oil (VENELEX) OINT Apply 1 application topically 3 (three) times daily. Special Instructions: Apply to bilateral buttocks qshift  . magnesium hydroxide (MILK OF MAGNESIA) 400 MG/5ML suspension Take 30 mLs by mouth daily as needed for mild constipation.  . NON FORMULARY Diet NAS, thin liquids  . nystatin cream (MYCOSTATIN) Apply 1 application topically 3 (three) times daily as needed. Special Instructions:apply to rash under right side of neck  .  polyethylene glycol (MIRALAX / GLYCOLAX) 17 g packet Take 17 g by mouth 2 (two) times daily.  . Pramox-PE-Glycerin-Petrolatum (HEMORRHOIDAL EX) Apply 1 application topically 2 (two) times daily as needed (Hemorrhoids).  . rivaroxaban (XARELTO) 20 MG TABS tablet Take 1 tablet (20 mg total) by mouth daily with supper.   No facility-administered encounter medications on file as of 10/17/2020.     SIGNIFICANT DIAGNOSTIC EXAMS   LABS REVIEWED PREVIOUS    09-24-19: wbc 6.5; hgb 14.0; hct 44.3; mcv 89. 7plt 213; glucose 93; bun 18; creat 0.54; k+ 3.6; na++ 139; ca 9.2 05-02-20: wbc 6.7; hgb 13.4; hct 43.5; mcv 92.6 plt 221; glucose 69; bun 28; creat 0.62; k+ 4.0; na++ 139; ca 9.1 liver normal albumin 3.7    07-09-20: urine culture no growth   NO NEW LABS.    Review of Systems  Unable to perform ROS: Dementia (does not participate )   Physical Exam Constitutional:      General: She is not in acute distress.    Appearance: She is well-developed. She is not diaphoretic.  Neck:     Thyroid: No thyromegaly.  Cardiovascular:     Rate and Rhythm: Normal rate and regular rhythm.     Pulses: Normal pulses.     Heart sounds: Normal heart sounds.  Pulmonary:     Effort: Pulmonary effort is normal. No respiratory distress.     Breath sounds: Normal breath sounds.  Abdominal:     General: Bowel sounds are normal. There is no distension.     Palpations: Abdomen is soft.     Tenderness: There is no abdominal tenderness.  Musculoskeletal:     Cervical back: Neck supple.     Right lower leg: No edema.     Left lower leg: No edema.     Comments: Neck is weak has contracture  Is able to move all extremities right extremity with ataxic movements     Lymphadenopathy:     Cervical: No cervical adenopathy.  Skin:    General: Skin is warm and dry.  Neurological:     Mental Status: She is alert. Mental status is at baseline.  Psychiatric:        Mood and Affect: Mood normal.      ASSESSMENT/ PLAN:  TODAY  1. Parkinson's disease 2. Parkinson's dementia 3. Chronic diastolic CHF (congestive heart failure) 4. Moderate protein calorie malnutrition  Will continue current medicaiotns Will continue current plan of care Will continue to monitor her status.   Time spent with patient 40 minutes: coordination of care counseling; medications;weights; goals of care overall status.    Synthia Innocent NP [facility] Adult Medicine  Contact [phone] Monday through Friday 8am- 5pm  After hours call [phone]

## 2020-10-25 ENCOUNTER — Non-Acute Institutional Stay (SKILLED_NURSING_FACILITY): Payer: Medicare Other | Admitting: Adult Health

## 2020-10-25 ENCOUNTER — Encounter: Payer: Self-pay | Admitting: Adult Health

## 2020-10-25 DIAGNOSIS — I2782 Chronic pulmonary embolism: Secondary | ICD-10-CM | POA: Diagnosis not present

## 2020-10-25 DIAGNOSIS — F0391 Unspecified dementia with behavioral disturbance: Secondary | ICD-10-CM | POA: Diagnosis not present

## 2020-10-25 DIAGNOSIS — G20 Parkinson's disease: Secondary | ICD-10-CM | POA: Diagnosis not present

## 2020-10-25 DIAGNOSIS — F0392 Unspecified dementia, unspecified severity, with psychotic disturbance: Secondary | ICD-10-CM

## 2020-10-25 NOTE — Progress Notes (Signed)
Location:  [facility] Room Number: 102 Place of Service:  SNF (31)   CODE STATUS: dnr  Allergies  Allergen Reactions  . Contrast Media [Iodinated Diagnostic Agents] Anaphylaxis and Shortness Of Breath  . Vancomycin Shortness Of Breath  . Vioxx [Rofecoxib] Other (See Comments)    REACTION: irregular heartbeat    Chief Complaint  Patient presents with  . Medical Management of Chronic Issues        Dementia with psychosis   Chronic pulmonary embolism without acute cor pulmonale unspecified pulmonary embolism type:  Parkinson's disease    HPI:  She is an 86 year old long term resident of this facility being seen for the management of her chronic illnesses: Dementia with psychosis   Chronic pulmonary embolism without acute cor pulmonale unspecified pulmonary embolism type:  Parkinson's disease. There are no reports of uncontrolled pain. She does have periods of time where she will yell out and scream. She does spend all of her time in bed per her choice.   Past Medical History:  Diagnosis Date  . CHF (congestive heart failure) (HCC)   . Chronic diastolic CHF (congestive heart failure) (HCC) 08/16/2009   Qualifier: Diagnosis of  By: Excell Seltzer, MD, [place]   . History of pulmonary embolism   . Parkinson's disease (HCC)   . Venous stasis     History reviewed. No pertinent surgical history.  Social History   Socioeconomic History  . Marital status: Single    Spouse name: Not on file  . Number of children: Not on file  . Years of education: Not on file  . Highest education level: Not on file  Occupational History  . Not on file  Tobacco Use  . Smoking status: Never Smoker  . Smokeless tobacco: Never Used  Substance and Sexual Activity  . Alcohol use: No  . Drug use: No  . Sexual activity: Not on file  Other Topics Concern  . Not on file  Social History Narrative  . Not on file   Social Determinants of Health   Financial Resource Strain:  Not on file  Food Insecurity: Not on file  Transportation Needs: Not on file  Physical Activity: Not on file  Stress: Not on file  Social Connections: Not on file  Intimate Partner Violence: Not on file   Family History  Problem Relation Age of Onset  . Heart disease Mother        31s      VITAL SIGNS BP 135/71   Pulse 74   Resp 18   Ht 5\' 2"  (1.575 m)   Wt 128 lb 9.6 oz (58.3 kg)   BMI 23.52 kg/m   Outpatient Encounter Medications as of 10/25/2020  Medication Sig  . acetaminophen (TYLENOL) 325 MG tablet Take 2 tablets (650 mg total) by mouth every 6 (six) hours as needed for mild pain, fever or headache (or Fever >/= 101).  12/25/2020 [place]-Castor Oil (VENELEX) OINT Apply 1 application topically 3 (three) times daily. Special Instructions: Apply to bilateral buttocks qshift  . magnesium hydroxide (MILK OF MAGNESIA) 400 MG/5ML suspension Take 30 mLs by mouth daily as needed for mild constipation.  . NON FORMULARY Diet NAS, thin liquids  . nystatin cream (MYCOSTATIN) Apply 1 application topically 3 (three) times daily as needed. Special Instructions:apply to rash under right side of neck  . polyethylene glycol (MIRALAX / GLYCOLAX) 17 g packet Take 17 g by mouth 2 (two) times daily.  . Pramox-PE-Glycerin-Petrolatum (  HEMORRHOIDAL EX) Apply 1 application topically 2 (two) times daily as needed (Hemorrhoids).  . rivaroxaban (XARELTO) 20 MG TABS tablet Take 1 tablet (20 mg total) by mouth daily with supper.   No facility-administered encounter medications on file as of 10/25/2020.     SIGNIFICANT DIAGNOSTIC EXAMS   LABS REVIEWED PREVIOUS    05-02-20: wbc 6.7; hgb 13.4; hct 43.5; mcv 92.6 plt 221; glucose 69; bun 28; creat 0.62; k+ 4.0; na++ 139; ca 9.1 liver normal albumin 3.7   07-09-20: urine culture no growth   NO NEW LABS.   Review of Systems  Unable to perform ROS: Dementia (will not answer questions )    Physical Exam Constitutional:      General: She is not in acute  distress.    Appearance: She is well-developed. She is not diaphoretic.  Neck:     Thyroid: No thyromegaly.  Cardiovascular:     Rate and Rhythm: Normal rate and regular rhythm.     Pulses: Normal pulses.     Heart sounds: Normal heart sounds.  Pulmonary:     Effort: Pulmonary effort is normal. No respiratory distress.     Breath sounds: Normal breath sounds.  Abdominal:     General: Bowel sounds are normal. There is no distension.     Palpations: Abdomen is soft.     Tenderness: There is no abdominal tenderness.  Musculoskeletal:     Cervical back: Neck supple.     Right lower leg: No edema.     Left lower leg: No edema.     Comments: Neck is weak has contracture  Is able to move all extremities right extremity with ataxic movements      Lymphadenopathy:     Cervical: No cervical adenopathy.  Skin:    General: Skin is warm and dry.  Neurological:     Mental Status: She is alert. Mental status is at baseline.  Psychiatric:        Mood and Affect: Mood normal.      ASSESSMENT/ PLAN:  TODAY;   1. Dementia with psychosis is without change weight is 128 pounds; is presently stable will monitor   2. Chronic pulmonary embolism without acute cor pulmonale unspecified pulmonary embolism type: is stable; she is presently declining xarelto 20 mg daily; will continue to offer this medication daily   3. Parkinson's disease: is without change; is not on medications; has decline neurology workup    PREVIOUS   4. Chronic diastolic CHF (congestive heart failure): her status is stable; she is presently compensated.   5. Venous (peripheral) insufficiency: is without change will monitor  6. Protein calorie malnutrition: weight is 128 pounds; she will decline meals at times; will not take supplements; albumin is 3.7   7. Psychosis in elderly with behavioral disturbance: is without change in status; she continues to have periods of time where she yells out; scream; calls staff names at  times; will not take medications to help her mood stabilization.   Will check cbc; cmp; will setup for DTAP   Synthia Innocent NP [facility]  Contact [phone] Monday through Friday 8am- 5pm  After hours call [phone]

## 2020-11-25 ENCOUNTER — Non-Acute Institutional Stay (SKILLED_NURSING_FACILITY): Payer: Medicare Other | Admitting: Internal Medicine

## 2020-11-25 ENCOUNTER — Encounter: Payer: Self-pay | Admitting: Internal Medicine

## 2020-11-25 DIAGNOSIS — E440 Moderate protein-calorie malnutrition: Secondary | ICD-10-CM | POA: Diagnosis not present

## 2020-11-25 DIAGNOSIS — F0391 Unspecified dementia with behavioral disturbance: Secondary | ICD-10-CM | POA: Diagnosis not present

## 2020-11-25 DIAGNOSIS — I5032 Chronic diastolic (congestive) heart failure: Secondary | ICD-10-CM

## 2020-11-25 DIAGNOSIS — Z86711 Personal history of pulmonary embolism: Secondary | ICD-10-CM | POA: Diagnosis not present

## 2020-11-25 DIAGNOSIS — G20 Parkinson's disease: Secondary | ICD-10-CM

## 2020-11-25 DIAGNOSIS — F0392 Unspecified dementia, unspecified severity, with psychotic disturbance: Secondary | ICD-10-CM

## 2020-11-25 NOTE — Progress Notes (Signed)
   NURSING HOME LOCATION: [facility] ROOM NUMBER:  102  CODE STATUS:  DNR  PCP:  Synthia Innocent NP  This is a nursing facility follow up visit of chronic medical diagnoses & to document compliance with Regulation 483.30 (c) in The Long Term Care Survey Manual Phase 2 which mandates caregiver visit ( visits can alternate among physician, PA or NP as per statutes) within 10 days of 30 days / 60 days/ 90 days post admission to SNF date    Interim medical record and care since last SNF visit was updated with review of diagnostic studies and change in clinical status since last visit were documented.  HPI: She is a permanent resident of facility with medical diagnoses of PVD, chronic diastolic congestive heart failure, Parkinson's disease, history of PTE, and dementia with psychoses.  There are no current labs, most recent labs were 05/02/2020.  Review of systems: Dementia invalidated responses.  When I asked how she were doing, the response was "do not know".  She was somewhat suspicious as to why I was there & did not understand the concept of the maintenance evaluation.  She was essentially nonverbal during the interview.  Physical exam:  Pertinent or positive findings: The most striking finding is a profound position change of the head with dramatic torticollis of the left neck.  The head is flexed to the right with her skull essentially lying on the right shoulder.Facies are blank.  Oral exam could not be completed as she would not follow command to open her mouth.  There was a tremor of her mouth, mainly of the lower lip.  She exhibits rosacea type dermatitis over the L cheek. The right cheek was obviously not visible  due to head position. There is hirsutism of the chin.  Heart sound are distant.  Breath sounds are also decreased.  Bowel sounds are active, heard even up in her left lower chest.  Pedal pulses are absent. Feet are in booties. Strength could not be tested as  there was no opposition in the lower extremities to command.  She does have severe interosseous wasting of the hands.  She exhibits a resting repetitive flexion tremor of the right index finger.  General appearance:  no acute distress, increased work of breathing is present.   Lymphatic: No lymphadenopathy about the head, neck, axilla. Eyes: No conjunctival inflammation or lid edema is present. There is no scleral icterus. Ears:  External ear exam shows no significant lesions or deformities.   Nose:  External nasal examination shows no deformity or inflammation. Nasal mucosa are pink and moist without lesions, exudates Neck:  No thyromegaly, masses, tenderness noted.    Heart:  No gallop, murmur, click, rub .  Lungs:  without wheezes, rhonchi, rales, rubs. Abdomen:  Abdomen is soft and nontender with no organomegaly, hernias, masses. GU: Deferred  Extremities:  No cyanosis, clubbing, edema  Neurologic exam :Balance, Rhomberg, finger to nose testing could not be completed due to clinical state Skin: Warm & dry w/o tenting. No significant lesions.  See summary under each active problem in the Problem List with associated updated therapeutic plan

## 2020-11-25 NOTE — Assessment & Plan Note (Signed)
Appears somewhat cachectic,severe interosseous wasting of hands present.

## 2020-11-25 NOTE — Assessment & Plan Note (Addendum)
No clinical signs of decompensation; ie, no peripheral edema, rales, NVD or HJR.

## 2020-11-25 NOTE — Patient Instructions (Signed)
See assessment and plan under each diagnosis in the problem list and acutely for this visit 

## 2020-11-25 NOTE — Assessment & Plan Note (Signed)
No Neuro F/U due to patient's psychosis & non compliance

## 2020-11-25 NOTE — Assessment & Plan Note (Signed)
No bleeding dyscrasias reported on maintenance prophylactic anticoagulantion

## 2020-11-25 NOTE — Assessment & Plan Note (Signed)
Essentially non verbal @ exam. Exhibits possible paranoia, suspicious of reason for my visit.

## 2020-12-19 ENCOUNTER — Encounter (HOSPITAL_COMMUNITY)
Admission: RE | Admit: 2020-12-19 | Discharge: 2020-12-19 | Disposition: A | Discharge status: Home / Self Care | Payer: Medicare Other | Source: Skilled Nursing Facility | Attending: Internal Medicine | Admitting: Internal Medicine

## 2020-12-19 ENCOUNTER — Non-Acute Institutional Stay (SKILLED_NURSING_FACILITY): Payer: Medicare Other | Admitting: Adult Health

## 2020-12-19 DIAGNOSIS — D6869 Other thrombophilia: Secondary | ICD-10-CM | POA: Diagnosis not present

## 2020-12-19 DIAGNOSIS — U071 COVID-19: Secondary | ICD-10-CM | POA: Diagnosis present

## 2020-12-19 LAB — CBC
HCT: 41.7 % (ref 36.0–46.0)
Hemoglobin: 13.4 g/dL (ref 12.0–15.0)
MCH: 28.6 pg (ref 26.0–34.0)
MCHC: 32.1 g/dL (ref 30.0–36.0)
MCV: 89.1 fL (ref 80.0–100.0)
Platelets: 182 10*3/uL (ref 150–400)
RBC: 4.68 MIL/uL (ref 3.87–5.11)
RDW: 14.6 % (ref 11.5–15.5)
WBC: 6.4 10*3/uL (ref 4.0–10.5)
nRBC: 0 % (ref 0.0–0.2)

## 2020-12-19 LAB — BASIC METABOLIC PANEL
Anion gap: <5 — ABNORMAL LOW (ref 5–15)
BUN: 16 mg/dL (ref 8–23)
CO2: 29 mmol/L (ref 22–32)
Calcium: 8.5 mg/dL — ABNORMAL LOW (ref 8.9–10.3)
Chloride: 108 mmol/L (ref 98–111)
Creatinine, Ser: 0.56 mg/dL (ref 0.44–1.00)
GFR, Estimated: >60 mL/min (ref >60)
Glucose, Bld: 92 mg/dL (ref 70–99)
Potassium: 4.1 mmol/L (ref 3.5–5.1)
Sodium: 138 mmol/L (ref 135–145)

## 2020-12-19 LAB — C-REACTIVE PROTEIN: CRP: 0.7 mg/dL (ref <1.0)

## 2020-12-19 LAB — D-DIMER, QUANTITATIVE: D-Dimer, Quant: 1.43 ug/mL-FEU — ABNORMAL HIGH (ref 0.00–0.50)

## 2020-12-19 NOTE — Progress Notes (Signed)
Location:  [facility] Room Number: 102 Place of Service:  SNF (31)   CODE STATUS: dnr   Allergies  Allergen Reactions  . Contrast Media [Iodinated Diagnostic Agents] Anaphylaxis and Shortness Of Breath  . Vancomycin Shortness Of Breath  . Vioxx [Rofecoxib] Other (See Comments)    REACTION: irregular heartbeat    Chief Complaint  Patient presents with  . Acute Visit    + covid     HPI:  She has tested positive for covid 19. There are no reports of fevers; no reports n/v/d. No reports of pain present. No changes in appetite. She has declined chest x-ray. She will decline medications frequently.   Past Medical History:  Diagnosis Date  . CHF (congestive heart failure) (HCC)   . Chronic diastolic CHF (congestive heart failure) (HCC) 08/16/2009   Qualifier: Diagnosis of  By: Excell Seltzer, MD, [place]   . History of pulmonary embolism   . Parkinson's disease (HCC)   . Venous stasis     No past surgical history on file.  Social History   Socioeconomic History  . Marital status: Single    Spouse name: Not on file  . Number of children: Not on file  . Years of education: Not on file  . Highest education level: Not on file  Occupational History  . Not on file  Tobacco Use  . Smoking status: Never  . Smokeless tobacco: Never  Substance and Sexual Activity  . Alcohol use: No  . Drug use: No  . Sexual activity: Not on file  Other Topics Concern  . Not on file  Social History Narrative  . Not on file   Social Determinants of Health   Financial Resource Strain: Not on file  Food Insecurity: Not on file  Transportation Needs: Not on file  Physical Activity: Not on file  Stress: Not on file  Social Connections: Not on file  Intimate Partner Violence: Not on file   Family History  Problem Relation Age of Onset  . Heart disease Mother        18s      VITAL SIGNS BP (!) 145/75   Pulse 75   Temp 97.8 F (36.6 C)   Resp 18   Ht 5'  2" (1.575 m)   Wt 131 lb 9.6 oz (59.7 kg)   BMI 24.07 kg/m   Outpatient Encounter Medications as of 12/19/2020  Medication Sig  . acetaminophen (TYLENOL) 325 MG tablet Take 2 tablets (650 mg total) by mouth every 6 (six) hours as needed for mild pain, fever or headache (or Fever >/= 101).  Lucilla Lame [place]-Castor Oil (VENELEX) OINT Apply 1 application topically 3 (three) times daily. Special Instructions: Apply to bilateral buttocks qshift  . magnesium hydroxide (MILK OF MAGNESIA) 400 MG/5ML suspension Take 30 mLs by mouth daily as needed for mild constipation.  . NON FORMULARY Diet NAS, thin liquids  . nystatin cream (MYCOSTATIN) Apply 1 application topically 3 (three) times daily as needed. Special Instructions:apply to rash under right side of neck  . polyethylene glycol (MIRALAX / GLYCOLAX) 17 g packet Take 17 g by mouth 2 (two) times daily.  . Pramox-PE-Glycerin-Petrolatum (HEMORRHOIDAL EX) Apply 1 application topically 2 (two) times daily as needed (Hemorrhoids).  . rivaroxaban (XARELTO) 20 MG TABS tablet Take 1 tablet (20 mg total) by mouth daily with supper.   No facility-administered encounter medications on file as of 12/19/2020.     SIGNIFICANT DIAGNOSTIC EXAMS   LABS  REVIEWED PREVIOUS    05-02-20: wbc 6.7; hgb 13.4; hct 43.5; mcv 92.6 plt 221; glucose 69; bun 28; creat 0.62; k+ 4.0; na++ 139; ca 9.1 liver normal albumin 3.7   07-09-20: urine culture no growth   TODAY  12-19-20: wbc 6.4; hgb 13.4; hct 41.7; mcv 89.1 plt 182; glucose 92; bun 16; creat 0.56; k+ 4.1; na++ 138; ca 8.5; GFR>60; d-dimer: 1.43 CRP 0.7    Review of Systems  Reason unable to perform ROS: does not like to answer questions.   Physical Exam Constitutional:      General: She is not in acute distress.    Appearance: She is well-developed. She is not diaphoretic.  Neck:     Thyroid: No thyromegaly.  Cardiovascular:     Rate and Rhythm: Normal rate and regular rhythm.     Pulses: Normal pulses.      Heart sounds: Normal heart sounds.  Pulmonary:     Effort: Pulmonary effort is normal. No respiratory distress.     Breath sounds: Normal breath sounds.  Abdominal:     General: Bowel sounds are normal. There is no distension.     Palpations: Abdomen is soft.     Tenderness: There is no abdominal tenderness.  Musculoskeletal:     Cervical back: Neck supple.     Right lower leg: No edema.     Left lower leg: No edema.     Comments:  Neck is weak has contracture  Is able to move all extremities right extremity with ataxic movements    Lymphadenopathy:     Cervical: No cervical adenopathy.  Skin:    General: Skin is warm and dry.  Neurological:     Mental Status: She is alert. Mental status is at baseline.  Psychiatric:        Mood and Affect: Mood normal.     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    Synthia Innocent NP [facility]  Contact [phone] Monday through Friday 8am- 5pm  After hours call [phone]

## 2020-12-20 ENCOUNTER — Other Ambulatory Visit (HOSPITAL_COMMUNITY)
Admission: RE | Admit: 2020-12-20 | Discharge: 2020-12-20 | Disposition: A | Discharge status: Home / Self Care | Payer: Medicare Other | Source: Skilled Nursing Facility | Attending: Adult Health | Admitting: Adult Health

## 2020-12-20 DIAGNOSIS — Z20822 Contact with and (suspected) exposure to covid-19: Secondary | ICD-10-CM | POA: Diagnosis not present

## 2020-12-20 LAB — SARS CORONAVIRUS 2 BY RT PCR (HOSPITAL ORDER, PERFORMED IN ~~LOC~~ HOSPITAL LAB): SARS Coronavirus 2: POSITIVE — AB

## 2020-12-25 ENCOUNTER — Encounter: Payer: Self-pay | Admitting: Adult Health

## 2020-12-25 ENCOUNTER — Non-Acute Institutional Stay (SKILLED_NURSING_FACILITY): Payer: Medicare Other | Admitting: Adult Health

## 2020-12-25 DIAGNOSIS — U071 COVID-19: Secondary | ICD-10-CM | POA: Diagnosis not present

## 2020-12-25 DIAGNOSIS — I5032 Chronic diastolic (congestive) heart failure: Secondary | ICD-10-CM | POA: Diagnosis not present

## 2020-12-25 DIAGNOSIS — I872 Venous insufficiency (chronic) (peripheral): Secondary | ICD-10-CM | POA: Diagnosis not present

## 2020-12-25 DIAGNOSIS — Z66 Do not resuscitate: Secondary | ICD-10-CM

## 2020-12-25 DIAGNOSIS — D6869 Other thrombophilia: Secondary | ICD-10-CM

## 2020-12-25 NOTE — Progress Notes (Signed)
Location:  [facility] Room Number: 102-P Place of Service:  SNF (31)   CODE STATUS: DNR  Allergies  Allergen Reactions  . Contrast Media [Iodinated Diagnostic Agents] Anaphylaxis and Shortness Of Breath  . Vancomycin Shortness Of Breath  . Vioxx [Rofecoxib] Other (See Comments)    REACTION: irregular heartbeat    Chief Complaint  Patient presents with  . Medical Management of Chronic Issues       SARS CoV 2 positive/hypercoagulable state due to covid 19:   . Chronic diastolic CHF (congestive heart failure)   Venous (peripheral) insufficiency    HPI:  She is a 86 year old long term resident of this facility being seen for the management of her chronic illnesses: SARS CoV 2 positive/hypercoagulable state due to covid 19:   . Chronic diastolic CHF (congestive heart failure)   Venous (peripheral) insufficiency. She remains on isolation for covid. There are no reports of fevers; no changes in appetite; no excessive anxiety.   Past Medical History:  Diagnosis Date  . CHF (congestive heart failure) (HCC)   . Chronic diastolic CHF (congestive heart failure) (HCC) 08/16/2009   Qualifier: Diagnosis of  By: Excell Seltzer, MD, [place]   . History of pulmonary embolism   . Parkinson's disease (HCC)   . Venous stasis     History reviewed. No pertinent surgical history.  Social History   Socioeconomic History  . Marital status: Single    Spouse name: Not on file  . Number of children: Not on file  . Years of education: Not on file  . Highest education level: Not on file  Occupational History  . Not on file  Tobacco Use  . Smoking status: Never  . Smokeless tobacco: Never  Vaping Use  . Vaping Use: Never used  Substance and Sexual Activity  . Alcohol use: No  . Drug use: No  . Sexual activity: Not on file  Other Topics Concern  . Not on file  Social History Narrative  . Not on file   Social Determinants of Health   Financial Resource Strain: Not on  file  Food Insecurity: Not on file  Transportation Needs: Not on file  Physical Activity: Not on file  Stress: Not on file  Social Connections: Not on file  Intimate Partner Violence: Not on file   Family History  Problem Relation Age of Onset  . Heart disease Mother        68s      VITAL SIGNS BP 126/68   Pulse 86   Temp 98.4 F (36.9 C)   Resp 20   Ht 5\' 2"  (1.575 m)   Wt 131 lb 9.6 oz (59.7 kg)   SpO2 95%   BMI 24.07 kg/m   Outpatient Encounter Medications as of 12/25/2020  Medication Sig  . acetaminophen (TYLENOL) 325 MG tablet Take 2 tablets (650 mg total) by mouth every 6 (six) hours as needed for mild pain, fever or headache (or Fever >/= 101).  02/25/2021 [place]-Castor Oil (VENELEX) OINT Apply 1 application topically 3 (three) times daily. Special Instructions: Apply to bilateral buttocks qshift  . Multiple Vitamins-Minerals (ZINC) LOZG Take 1 lozenge by mouth 5 (five) times daily.  . NON FORMULARY Diet NAS, thin liquids  . polyethylene glycol (MIRALAX / GLYCOLAX) 17 g packet Take 17 g by mouth 2 (two) times daily as needed.  . rivaroxaban (XARELTO) 20 MG TABS tablet Take 1 tablet (20 mg total) by mouth daily with supper.  Lucilla Lame  talc (ZEASORB) powder Apply 1 application topically daily as needed.  . vitamin C (ASCORBIC ACID) 500 MG tablet Take 500 mg by mouth daily.  . Vitamin D, Ergocalciferol, (DRISDOL) 1.25 MG (50000 UNIT) CAPS capsule Take 50,000 Units by mouth every 7 (seven) days.  . [DISCONTINUED] magnesium hydroxide (MILK OF MAGNESIA) 400 MG/5ML suspension Take 30 mLs by mouth daily as needed for mild constipation.  . [DISCONTINUED] nystatin cream (MYCOSTATIN) Apply 1 application topically 3 (three) times daily as needed. Special Instructions:apply to rash under right side of neck  . [DISCONTINUED] Pramox-PE-Glycerin-Petrolatum (HEMORRHOIDAL EX) Apply 1 application topically 2 (two) times daily as needed (Hemorrhoids).   No facility-administered encounter medications  on file as of 12/25/2020.     SIGNIFICANT DIAGNOSTIC EXAMS   LABS REVIEWED PREVIOUS    05-02-20: wbc 6.7; hgb 13.4; hct 43.5; mcv 92.6 plt 221; glucose 69; bun 28; creat 0.62; k+ 4.0; na++ 139; ca 9.1 liver normal albumin 3.7   07-09-20: urine culture no growth  12-19-20: wbc 6.4; hgb 13.4; hct 41.7; mcv 89.1 plt 182; glucose 92; bun 16; creat 0.56; k+ 4.1; na++ 138; ca 8.5; GFR>60; d-dimer: 1.43 CRP 0.7    NO NEW LABS.   Review of Systems  Unable to perform ROS: Dementia (unable to participate)   Physical Exam Constitutional:      General: She is not in acute distress.    Appearance: She is well-developed. She is not diaphoretic.  Neck:     Thyroid: No thyromegaly.  Cardiovascular:     Rate and Rhythm: Normal rate and regular rhythm.     Pulses: Normal pulses.     Heart sounds: Normal heart sounds.  Pulmonary:     Effort: Pulmonary effort is normal. No respiratory distress.     Breath sounds: Normal breath sounds.  Abdominal:     General: Bowel sounds are normal. There is no distension.     Palpations: Abdomen is soft.     Tenderness: There is no abdominal tenderness.  Musculoskeletal:     Cervical back: Neck supple.     Right lower leg: No edema.     Left lower leg: No edema.     Comments: Neck is weak has contracture Is able to move all extremities right extremity with ataxic movements     Lymphadenopathy:     Cervical: No cervical adenopathy.  Skin:    General: Skin is warm and dry.  Neurological:     Mental Status: She is alert. Mental status is at baseline.  Psychiatric:        Mood and Affect: Mood normal.     ASSESSMENT/ PLAN:  TODAY;   SARS CoV 2 positive/hypercoagulable state due to covid 19: is presently is presently on supplements; continues with long term xarelto will continue to monitor her status  2. Chronic diastolic CHF (congestive heart failure) she is presently compensated; will monitor  3. Venous (peripheral) insufficiency: is without change     PREVIOUS    4. Protein calorie malnutrition: weight is 131 pounds; she will decline meals at times; will not take supplements; albumin is 3.7   5. Psychosis in elderly with behavioral disturbance: is without change in status; she continues to have periods of time where she yells out; scream; calls staff names at times; will not take medications to help her mood stabilization.   6. Dementia with psychosis is without change weight is 131 pounds; is presently stable will monitor she will not take any medications.   7.  Chronic pulmonary embolism without acute cor pulmonale unspecified pulmonary embolism type: is stable; she is presently declining xarelto 20 mg some days; will continue to offer this medication daily   8. Parkinson's disease: is without change; is not on medications; has decline neurology workup     Synthia Innocent NP [facility]  Contact [phone] Monday through Friday 8am- 5pm  After hours call [phone]

## 2020-12-26 DIAGNOSIS — D6869 Other thrombophilia: Secondary | ICD-10-CM | POA: Insufficient documentation

## 2020-12-26 DIAGNOSIS — U071 COVID-19: Secondary | ICD-10-CM | POA: Insufficient documentation

## 2021-01-16 ENCOUNTER — Non-Acute Institutional Stay (SKILLED_NURSING_FACILITY): Payer: Medicare Other | Admitting: Adult Health

## 2021-01-16 ENCOUNTER — Encounter: Payer: Self-pay | Admitting: Adult Health

## 2021-01-16 DIAGNOSIS — F0391 Unspecified dementia with behavioral disturbance: Secondary | ICD-10-CM

## 2021-01-16 DIAGNOSIS — G20 Parkinson's disease: Secondary | ICD-10-CM | POA: Diagnosis not present

## 2021-01-16 DIAGNOSIS — F0392 Unspecified dementia, unspecified severity, with psychotic disturbance: Secondary | ICD-10-CM

## 2021-01-16 DIAGNOSIS — F03918 Unspecified dementia, unspecified severity, with other behavioral disturbance: Secondary | ICD-10-CM

## 2021-01-16 DIAGNOSIS — I5032 Chronic diastolic (congestive) heart failure: Secondary | ICD-10-CM | POA: Diagnosis not present

## 2021-01-16 NOTE — Progress Notes (Signed)
Location:  [facility] Room Number: 102-P Place of Service:  SNF (31)   CODE STATUS: DNR  Allergies  Allergen Reactions   Contrast Media [Iodinated Diagnostic Agents] Anaphylaxis and Shortness Of Breath   Vancomycin Shortness Of Breath   Vioxx [Rofecoxib] Other (See Comments)    REACTION: irregular heartbeat    Chief Complaint  Patient presents with   Acute Visit    Care plan meeting.    HPI:  We have come together for her care plan meeting.is followed by hospice care . BIMS none; MOOD 3/30: decreased appetite; short temper; decreased interest in activities. She has had one fall from no injury. She does continue to decline medications and adls; she does yell out on a frequent basis. She is extensive assist to dependent with adls including eating. She is incontinent of bladder and bowel. Therapy none at this time.  Dietary: weight is 131.6 pounds is stable NAS dietary appetite is variable.   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 No pertinent surgical history.  Social History   Socioeconomic History   Marital status: Single    Spouse name: Not on file   Number of children: Not on file   Years of education: Not on file   Highest education level: Not on file  Occupational History   Not on file  Tobacco Use   Smoking status: Never   Smokeless tobacco: Never  Vaping Use   Vaping Use: Never used  Substance and Sexual Activity   Alcohol use: No   Drug use: No   Sexual activity:  Not on file  Other Topics Concern   Not on file  Social History Narrative   Not on file   Social Determinants of Health   Financial Resource Strain: Not on file  Food Insecurity: Not on file  Transportation Needs: Not on file  Physical Activity: Not on file  Stress: Not on file  Social Connections: Not on file  Intimate Partner Violence: Not on file   Family History  Problem Relation Age of Onset   Heart disease Mother        93s      VITAL SIGNS BP (!) 149/92   Pulse 75   Temp 97.6 F (36.4 C)   Resp 16   Ht 5\' 2"  (1.575 m)   Wt 131 lb 9.6 oz (59.7 kg)   SpO2 96%   BMI 24.07 kg/m   Outpatient Encounter Medications as of 01/16/2021  Medication Sig   acetaminophen (TYLENOL) 325 MG tablet Take 2 tablets (650 mg total) by mouth every 6 (six) hours as needed for mild pain, fever or headache (or Fever >/= 101).   Balsam [place]-Castor Oil (VENELEX) OINT Apply 1 application topically 3 (three) times daily. Special Instructions: Apply to bilateral buttocks qshift   NON FORMULARY Diet NAS, thin liquids   polyethylene glycol (MIRALAX / GLYCOLAX) 17 g packet Take 17 g by mouth 2 (two) times daily as needed.   rivaroxaban (XARELTO) 20 MG TABS tablet  Take 1 tablet (20 mg total) by mouth daily with supper.   talc (ZEASORB) powder Apply 1 application topically daily as needed.   [DISCONTINUED] vitamin C (ASCORBIC ACID) 500 MG tablet Take 500 mg by mouth daily.   [DISCONTINUED] Vitamin D, Ergocalciferol, (DRISDOL) 1.25 MG (50000 UNIT) CAPS capsule Take 50,000 Units by mouth every 7 (seven) days.   No facility-administered encounter medications on file as of 01/16/2021.     SIGNIFICANT DIAGNOSTIC EXAMS   LABS REVIEWED PREVIOUS    05-02-20: wbc 6.7; hgb 13.4; hct 43.5; mcv 92.6 plt 221; glucose 69; bun 28; creat 0.62; k+ 4.0; na++ 139; ca 9.1 liver normal albumin 3.7   07-09-20: urine culture no growth  12-19-20: wbc 6.4; hgb 13.4; hct 41.7; mcv 89.1 plt 182; glucose 92; bun 16;  creat 0.56; k+ 4.1; na++ 138; ca 8.5; GFR>60; d-dimer: 1.43 CRP 0.7    NO NEW LABS.   Review of Systems  Reason unable to perform ROS: does not answer questions.   Physical Exam Constitutional:      General: She is not in acute distress.    Appearance: She is well-developed. She is not diaphoretic.  Neck:     Thyroid: No thyromegaly.  Cardiovascular:     Rate and Rhythm: Normal rate and regular rhythm.     Pulses: Normal pulses.     Heart sounds: Normal heart sounds.  Pulmonary:     Effort: Pulmonary effort is normal. No respiratory distress.     Breath sounds: Normal breath sounds.  Abdominal:     General: Bowel sounds are normal. There is no distension.     Palpations: Abdomen is soft.     Tenderness: There is no abdominal tenderness.  Musculoskeletal:     Cervical back: Neck supple.     Right lower leg: No edema.     Left lower leg: No edema.     Comments:  Neck is weak has contracture Is able to move all extremities right extremity with ataxic movements      Lymphadenopathy:     Cervical: No cervical adenopathy.  Skin:    General: Skin is warm and dry.  Neurological:     Mental Status: She is alert. Mental status is at baseline.  Psychiatric:        Mood and Affect: Mood normal.     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   Time spent with patient: 40 minutes: medications; therapy needs; overall health status; hospice needs.    Synthia Innocent NP [facility]  Contact [phone] Monday through Friday 8am- 5pm  After hours call [phone]

## 2021-01-24 ENCOUNTER — Encounter: Payer: Self-pay | Admitting: Adult Health

## 2021-01-24 ENCOUNTER — Non-Acute Institutional Stay (SKILLED_NURSING_FACILITY): Payer: Medicare Other | Admitting: Adult Health

## 2021-01-24 DIAGNOSIS — F0391 Unspecified dementia with behavioral disturbance: Secondary | ICD-10-CM

## 2021-01-24 DIAGNOSIS — F0392 Unspecified dementia, unspecified severity, with psychotic disturbance: Secondary | ICD-10-CM

## 2021-01-24 DIAGNOSIS — E440 Moderate protein-calorie malnutrition: Secondary | ICD-10-CM | POA: Diagnosis not present

## 2021-01-24 DIAGNOSIS — F03918 Unspecified dementia, unspecified severity, with other behavioral disturbance: Secondary | ICD-10-CM

## 2021-01-24 NOTE — Progress Notes (Signed)
Location:  [facility] Room Number: 102-P Place of Service:  SNF (31)   CODE STATUS: DNR  Allergies  Allergen Reactions   Contrast Media [Iodinated Diagnostic Agents] Anaphylaxis and Shortness Of Breath   Vancomycin Shortness Of Breath   Vioxx [Rofecoxib] Other (See Comments)    REACTION: irregular heartbeat    Chief Complaint  Patient presents with   Medical Management of Chronic Issues       Protein calorie malnutrition:  Psychosis in elderly with behavioral disturbance: . Dementia with psychosis    HPI:  She is a 86 year old long term resident of this facility being seen for the management of her chronic illnesses: Protein calorie malnutrition:  Psychosis in elderly with behavioral disturbance: . Dementia with psychosis. There are no reports of uncontrolled pain; she will decline medications frequently; she does have periods of time where she will yell out. She continues to be followed by hospice care.   Past Medical History:  Diagnosis Date   CHF (congestive heart failure) (HCC)    Chronic diastolic CHF (congestive heart failure) (HCC) 08/16/2009   Qualifier: Diagnosis of  By: Excell Seltzer, MD, [place]    History of pulmonary embolism    Parkinson's disease (HCC)    Venous stasis     History reviewed. No pertinent surgical history.  Social History   Socioeconomic History   Marital status: Single    Spouse name: Not on file   Number of children: Not on file   Years of education: Not on file   Highest education level: Not on file  Occupational History   Not on file  Tobacco Use   Smoking status: Never   Smokeless tobacco: Never  Vaping Use   Vaping Use: Never used  Substance and Sexual Activity   Alcohol use: No   Drug use: No   Sexual activity: Not on file  Other Topics Concern   Not on file  Social History Narrative   Not on file   Social Determinants of Health   Financial Resource Strain: Not on file  Food Insecurity: Not on  file  Transportation Needs: Not on file  Physical Activity: Not on file  Stress: Not on file  Social Connections: Not on file  Intimate Partner Violence: Not on file   Family History  Problem Relation Age of Onset   Heart disease Mother        94s      VITAL SIGNS BP 138/76   Pulse 68   Temp 97.8 F (36.6 C)   Resp 16   Ht 5\' 2"  (1.575 m)   Wt 130 lb 6.4 oz (59.1 kg)   SpO2 96%   BMI 23.85 kg/m   Outpatient Encounter Medications as of 01/24/2021  Medication Sig   acetaminophen (TYLENOL) 325 MG tablet Take 2 tablets (650 mg total) by mouth every 6 (six) hours as needed for mild pain, fever or headache (or Fever >/= 101).   Balsam [place]-Castor Oil (VENELEX) OINT Apply 1 application topically 3 (three) times daily. Special Instructions: Apply to bilateral buttocks qshift   NON FORMULARY Diet NAS, thin liquids   polyethylene glycol (MIRALAX / GLYCOLAX) 17 g packet Take 17 g by mouth 2 (two) times daily as needed.   rivaroxaban (XARELTO) 20 MG TABS tablet Take 1 tablet (20 mg total) by mouth daily with supper.   talc (ZEASORB) powder Apply 1 application topically daily as needed.   No facility-administered encounter medications on file as of  01/24/2021.     SIGNIFICANT DIAGNOSTIC EXAMS   LABS REVIEWED PREVIOUS    05-02-20: wbc 6.7; hgb 13.4; hct 43.5; mcv 92.6 plt 221; glucose 69; bun 28; creat 0.62; k+ 4.0; na++ 139; ca 9.1 liver normal albumin 3.7   07-09-20: urine culture no growth  12-19-20: wbc 6.4; hgb 13.4; hct 41.7; mcv 89.1 plt 182; glucose 92; bun 16; creat 0.56; k+ 4.1; na++ 138; ca 8.5; GFR>60; d-dimer: 1.43 CRP 0.7    NO NEW LABS.   Review of Systems  Unable to perform ROS: Dementia (unable to fully participate)   Physical Exam Constitutional:      General: She is not in acute distress.    Appearance: She is well-developed. She is not diaphoretic.  Neck:     Thyroid: No thyromegaly.  Cardiovascular:     Rate and Rhythm: Normal rate and regular rhythm.      Pulses: Normal pulses.     Heart sounds: Normal heart sounds.  Pulmonary:     Effort: Pulmonary effort is normal. No respiratory distress.     Breath sounds: Normal breath sounds.  Abdominal:     General: Bowel sounds are normal. There is no distension.     Palpations: Abdomen is soft.     Tenderness: There is no abdominal tenderness.  Musculoskeletal:     Cervical back: Neck supple.     Right lower leg: No edema.     Left lower leg: No edema.  Lymphadenopathy:     Cervical: No cervical adenopathy.  Skin:    General: Skin is warm and dry.  Neurological:     Mental Status: She is alert. Mental status is at baseline.  Psychiatric:        Mood and Affect: Mood normal.    ASSESSMENT/ PLAN:  TODAY;   Protein calorie malnutrition: weight is 130 pounds; is presently without significant change.   2. Psychosis in elderly with behavioral disturbance: is without changes in status; she continues to have periods of time where she screams and yells. She will call staff names; will not take any medications to help with her mood status.   3. Dementia with psychosis: weight is 130 pounds; is presently without significant change will monitor    PREVIOUS   4. Chronic pulmonary embolism without acute cor pulmonale unspecified pulmonary embolism type: is stable; she is presently declining xarelto 20 mg some days; will continue to offer this medication daily   5. Parkinson's disease: is without change; is not on medications; has decline neurology workup   6. Chronic diastolic CHF (congestive heart failure) she is presently compensated; will monitor  7 Venous (peripheral) insufficiency: is without change     Synthia Innocent NP [facility]  Contact [phone] Monday through Friday 8am- 5pm  After hours call [phone]

## 2021-02-10 ENCOUNTER — Non-Acute Institutional Stay (SKILLED_NURSING_FACILITY): Payer: Medicare Other | Admitting: Adult Health

## 2021-02-10 ENCOUNTER — Encounter: Payer: Self-pay | Admitting: Adult Health

## 2021-02-10 DIAGNOSIS — Z0000 Encounter for general adult medical examination without abnormal findings: Secondary | ICD-10-CM | POA: Diagnosis not present

## 2021-02-10 NOTE — Progress Notes (Signed)
Subjective:   [name] is a 86 y.o. female who presents for Medicare Annual (Subsequent) preventive examination.  Review of Systems    Review of Systems  Unable to perform ROS: Dementia (unable to participate)   Cardiac Risk Factors include: advanced age (>3455men, 30>65 women);sedentary lifestyle     Objective:    Today's Vitals   02/10/21 1013 02/10/21 1455  BP: (!) 141/84   Pulse: 71   Resp: 16   Temp: 98 F (36.7 C)   SpO2: 96%   Weight: 130 lb 6.4 oz (59.1 kg)   Height: 5\' 2"  (1.575 m)   PainSc:  0-No pain   Body mass index is 23.85 kg/m.  Advanced Directives 02/10/2021 01/24/2021 01/16/2021 09/27/2020 07/29/2020 07/18/2020 06/26/2020  Does Patient Have a Medical Advance Directive? Yes Yes Yes Yes Yes Yes Yes  Type of Advance Directive Out of facility DNR (pink MOST or yellow form) Out of facility DNR (pink MOST or yellow form) Out of facility DNR (pink MOST or yellow form) Out of facility DNR (pink MOST or yellow form) Out of facility DNR (pink MOST or yellow form) Out of facility DNR (pink MOST or yellow form) Out of facility DNR (pink MOST or yellow form)  Does patient want to make changes to medical advance directive? No - Patient declined No - Patient declined No - Patient declined No - Patient declined No - Patient declined No - Patient declined No - Patient declined  Copy of Healthcare Power of Attorney in Chart? - - - - - - -  Would patient like information on creating a medical advance directive? - - - - - - -  Pre-existing out of facility DNR order (yellow form or pink MOST form) Yellow form placed in chart (order not valid for inpatient use) - Pink MOST/Yellow Form most recent copy in chart - Physician notified to receive inpatient order Yellow form placed in chart (order not valid for inpatient use) Yellow form placed in chart (order not valid for inpatient use) Yellow form placed in chart (order not valid for inpatient use) Yellow form placed in chart (order not valid  for inpatient use)    Current Medications (verified) Outpatient Encounter Medications as of 02/10/2021  Medication Sig   acetaminophen (TYLENOL) 325 MG tablet Take 2 tablets (650 mg total) by mouth every 6 (six) hours as needed for mild pain, fever or headache (or Fever >/= 101).   Balsam [place]-Castor Oil (VENELEX) OINT Apply 1 application topically 3 (three) times daily. Special Instructions: Apply to bilateral buttocks qshift   NON FORMULARY Diet NAS, thin liquids   polyethylene glycol (MIRALAX / GLYCOLAX) 17 g packet Take 17 g by mouth 2 (two) times daily as needed.   rivaroxaban (XARELTO) 20 MG TABS tablet Take 1 tablet (20 mg total) by mouth daily with supper.   talc (ZEASORB) powder Apply 1 application topically daily as needed.   No facility-administered encounter medications on file as of 02/10/2021.    Allergies (verified) Contrast media [iodinated diagnostic agents], Vancomycin, and Vioxx [rofecoxib]   History: Past Medical History:  Diagnosis Date   CHF (congestive heart failure) (HCC)    Chronic diastolic CHF (congestive heart failure) (HCC) 08/16/2009   Qualifier: Diagnosis of  By: Excell Seltzerooper, MD, [place]    History of pulmonary embolism    Parkinson's disease (HCC)    Venous stasis    History reviewed. No pertinent surgical history. Family History  Problem Relation Age of Onset   Heart  disease Mother        40s   Social History   Socioeconomic History   Marital status: Single    Spouse name: Not on file   Number of children: Not on file   Years of education: Not on file   Highest education level: Not on file  Occupational History   Not on file  Tobacco Use   Smoking status: Never   Smokeless tobacco: Never  Vaping Use   Vaping Use: Never used  Substance and Sexual Activity   Alcohol use: No   Drug use: No   Sexual activity: Not on file  Other Topics Concern   Not on file  Social History Narrative   Not on file   Social Determinants of Health    Financial Resource Strain: Not on file  Food Insecurity: Not on file  Transportation Needs: Not on file  Physical Activity: Not on file  Stress: Not on file  Social Connections: Not on file    Tobacco Counseling Counseling given: Not Answered   Clinical Intake:  Pre-visit preparation completed: Yes  Pain : No/denies pain Pain Score: 0-No pain     BMI - recorded: 23.85 Nutritional Status: BMI of 19-24  Normal Nutritional Risks: Unintentional weight loss Diabetes: No  How often do you need to have someone help you when you read instructions, pamphlets, or other written materials from your doctor or pharmacy?: 5 - Always  Diabetic?no   Interpreter Needed?: No      Activities of Daily Living In your present state of health, do you have any difficulty performing the following activities: 02/10/2021  Hearing? N  Vision? N  Difficulty concentrating or making decisions? Y  Walking or climbing stairs? N  Dressing or bathing? N  Doing errands, shopping? N  Preparing Food and eating ? Y  Using the Toilet? Y  In the past six months, have you accidently leaked urine? Y  Do you have problems with loss of bowel control? Y  Managing your Medications? Y  Managing your Finances? Y  Housekeeping or managing your Housekeeping? Y  Some recent data might be hidden    Patient Care Team: Sharee Holster, NP as PCP - [facility] (Geriatric [facility]  Indicate any recent Medical Services you may have received from other than [facility] providers in the past year (date may be approximate).     Assessment:   This is a routine wellness examination for [name].  Hearing/Vision screen No results found.  Dietary issues and exercise activities discussed: Current Exercise Habits: The patient does not participate in regular exercise at present, Exercise limited by: orthopedic condition(s)   Goals Addressed             This Visit's  Progress    DIET - INCREASE WATER INTAKE   Not on track    Follow up with Provider as scheduled   Not on track    General - Client will not be readmitted within 30 days ([facility])   Not on track    Have 3 meals a day   No change      Depression Screen PHQ 2/9 Scores 02/10/2021 02/05/2020  Exception Documentation Patient refusal Patient refusal    Fall Risk Fall Risk  02/10/2021 02/05/2020  Falls in the past year? 1 0  Number falls in past yr: 0 0  Injury with Fall? 0 0  Risk for fall due to : Impaired balance/gait;Impaired mobility Impaired balance/gait;Impaired mobility  Follow up Falls evaluation completed Falls evaluation completed    FALL RISK PREVENTION PERTAINING TO THE HOME:  Any stairs in or around the home? No  If so, are there any without handrails? Yes  Home free of loose throw rugs in walkways, pet beds, electrical cords, etc? Yes  Adequate lighting in your home to reduce risk of falls? Yes   ASSISTIVE DEVICES UTILIZED TO PREVENT FALLS:  Life alert? No  Use of a cane, walker or w/c? No  Grab bars in the bathroom? Yes  Shower chair or bench in shower? Yes  Elevated toilet seat or a handicapped toilet? Yes   TIMED UP AND GO:  Was the test performed? No .  Length of time to ambulate 10 feet: nonambulatory  sec.   Gait unsteady with use of assistive device, provider informed and education provided. Is bed reiddent   Cognitive Function: MMSE - Mini Mental State Exam 02/10/2021  Not completed: Unable to complete     6CIT Screen 02/05/2020  What Year? (No Data)    Immunizations Immunization History  Administered Date(s) Administered   Pneumococcal Polysaccharide-23 06/23/2007    TDAP status: Up to date  Flu Vaccine status: Up to date  Pneumococcal vaccine status: Up to date  Covid-19 vaccine status: Completed vaccines  Qualifies for Shingles Vaccine? No   Zostavax completed No   Shingrix Completed?: No.    Education has been provided regarding the  importance of this vaccine. Patient has been advised to call insurance company to determine out of pocket expense if they have not yet received this vaccine. Advised may also receive vaccine at local pharmacy or Health Dept. Verbalized acceptance and understanding.  Screening Tests Health Maintenance  Topic Date Due   TETANUS/TDAP  Never done   Zoster Vaccines- Shingrix (1 of 2) Never done   INFLUENZA VACCINE  01/20/2021   PNA vac Low Risk Adult (2 of 2 - PCV13) 04/23/2021 (Originally 06/22/2008)   HPV VACCINES  Aged Out   DEXA SCAN  Discontinued   COVID-19 Vaccine  Discontinued    Health Maintenance  Health Maintenance Due  Topic Date Due   TETANUS/TDAP  Never done   Zoster Vaccines- Shingrix (1 of 2) Never done   INFLUENZA VACCINE  01/20/2021    Colorectal cancer screening: No longer required.   Mammogram status: No longer required due to hospice care .  Bone Density status: Completed no longer required . Results reflect: Bone density results: OSTEOPOROSIS. Repeat every no  longer required  years. no longer required   Lung Cancer Screening: (Low Dose CT Chest recommended if Age 6-80 years, 30 pack-year currently smoking OR have quit w/in 15years.) does not qualify.   Lung Cancer Screening Referral: n/a   Additional Screening:  Hepatitis C Screening: does not qualify; Completed   Vision Screening: Recommended annual ophthalmology exams for early detection of glaucoma and other disorders of the eye. Is the patient up to date with their annual eye exam?  Yes  Who is the provider or what is the name of the office in which the patient attends annual eye exams?  If pt is not established with a provider, would they like to be referred to a provider to establish care? No .   Dental Screening: Recommended annual dental exams for proper oral hygiene  Community Resource Referral / Chronic Care Management: CRR required this visit?  No   CCM required this visit?  No      Plan:  I have personally reviewed and noted the following in the patient's chart:   Medical and social history Use of alcohol, tobacco or illicit drugs  Current medications and supplements including opioid prescriptions.  Functional ability and status Nutritional status Physical activity Advanced directives List of other physicians Hospitalizations, surgeries, and ER visits in previous 12 months Vitals Screenings to include cognitive, depression, and falls Referrals and appointments  In addition, I have reviewed and discussed with patient certain preventive protocols, quality metrics, and best practice recommendations. A written personalized care plan for preventive services as well as general preventive health recommendations were provided to patient.     Sharee Holster, NP   02/10/2021   Nurse Notes:

## 2021-02-10 NOTE — Progress Notes (Signed)
  Location:  [facility] Room Number: 102-P Place of Service:  SNF (31)   CODE STATUS: DNR  Allergies  Allergen Reactions   Contrast Media [Iodinated Diagnostic Agents] Anaphylaxis and Shortness Of Breath   Vancomycin Shortness Of Breath   Vioxx [Rofecoxib] Other (See Comments)    REACTION: irregular heartbeat    Chief Complaint  Patient presents with   Medical Management of Chronic Issues    Annual wellness exam     HPI:    Past Medical History:  Diagnosis Date   CHF (congestive heart failure) (HCC)    Chronic diastolic CHF (congestive heart failure) (HCC) 08/16/2009   Qualifier: Diagnosis of  By: Excell Seltzer, MD, [place]    History of pulmonary embolism    Parkinson's disease (HCC)    Venous stasis     History reviewed. No pertinent surgical history.  Social History   Socioeconomic History   Marital status: Single    Spouse name: Not on file   Number of children: Not on file   Years of education: Not on file   Highest education level: Not on file  Occupational History   Not on file  Tobacco Use   Smoking status: Never   Smokeless tobacco: Never  Vaping Use   Vaping Use: Never used  Substance and Sexual Activity   Alcohol use: No   Drug use: No   Sexual activity: Not on file  Other Topics Concern   Not on file  Social History Narrative   Not on file   Social Determinants of Health   Financial Resource Strain: Not on file  Food Insecurity: Not on file  Transportation Needs: Not on file  Physical Activity: Not on file  Stress: Not on file  Social Connections: Not on file  Intimate Partner Violence: Not on file   Family History  Problem Relation Age of Onset   Heart disease Mother        59s      VITAL SIGNS BP (!) 141/84   Pulse 71   Temp 98 F (36.7 C)   Resp 16   Ht 5\' 2"  (1.575 m)   Wt 130 lb 6.4 oz (59.1 kg)   SpO2 96%   BMI 23.85 kg/m   Outpatient Encounter Medications as of 02/10/2021  Medication Sig    acetaminophen (TYLENOL) 325 MG tablet Take 2 tablets (650 mg total) by mouth every 6 (six) hours as needed for mild pain, fever or headache (or Fever >/= 101).   Balsam [place]-Castor Oil (VENELEX) OINT Apply 1 application topically 3 (three) times daily. Special Instructions: Apply to bilateral buttocks qshift   NON FORMULARY Diet NAS, thin liquids   polyethylene glycol (MIRALAX / GLYCOLAX) 17 g packet Take 17 g by mouth 2 (two) times daily as needed.   rivaroxaban (XARELTO) 20 MG TABS tablet Take 1 tablet (20 mg total) by mouth daily with supper.   talc (ZEASORB) powder Apply 1 application topically daily as needed.   No facility-administered encounter medications on file as of 02/10/2021.     SIGNIFICANT DIAGNOSTIC EXAMS       ASSESSMENT/ PLAN:     02/12/2021 NP [facility]  Contact [phone] Monday through Friday 8am- 5pm  After hours call [phone]

## 2021-02-10 NOTE — Patient Instructions (Signed)
  [name] , Thank you for taking time to come for your Medicare Wellness Visit. I appreciate your ongoing commitment to your health goals. Please review the following plan we discussed and let me know if I can assist you in the future.   These are the goals we discussed:  Goals      DIET - INCREASE WATER INTAKE     Follow up with Provider as scheduled     General - Client will not be readmitted within 30 days ([facility])     Have 3 meals a day        This is a list of the screening recommended for you and due dates:  Health Maintenance  Topic Date Due   Tetanus Vaccine  Never done   Zoster (Shingles) Vaccine (1 of 2) Never done   Flu Shot  01/20/2021   Pneumonia vaccines (2 of 2 - PCV13) 04/23/2021*   HPV Vaccine  Aged Out   DEXA scan (bone density measurement)  Discontinued   COVID-19 Vaccine  Discontinued  *Topic was postponed. The date shown is not the original due date.

## 2021-02-19 ENCOUNTER — Non-Acute Institutional Stay (SKILLED_NURSING_FACILITY): Payer: Medicare Other | Admitting: Internal Medicine

## 2021-02-19 ENCOUNTER — Encounter: Payer: Self-pay | Admitting: Internal Medicine

## 2021-02-19 DIAGNOSIS — U071 COVID-19: Secondary | ICD-10-CM | POA: Diagnosis not present

## 2021-02-19 DIAGNOSIS — E538 Deficiency of other specified B group vitamins: Secondary | ICD-10-CM | POA: Diagnosis not present

## 2021-02-19 DIAGNOSIS — N182 Chronic kidney disease, stage 2 (mild): Secondary | ICD-10-CM

## 2021-02-19 DIAGNOSIS — F0391 Unspecified dementia with behavioral disturbance: Secondary | ICD-10-CM | POA: Diagnosis not present

## 2021-02-19 DIAGNOSIS — G20 Parkinson's disease: Secondary | ICD-10-CM

## 2021-02-19 DIAGNOSIS — F0392 Unspecified dementia, unspecified severity, with psychotic disturbance: Secondary | ICD-10-CM

## 2021-02-19 NOTE — Assessment & Plan Note (Signed)
Patient is on no antiparkinsonian medications due to noncompliance.

## 2021-02-19 NOTE — Assessment & Plan Note (Signed)
Current creatinine 0.56 / GFR > 60  ; CKD Stage 2 Medication List reviewed. No nephrotoxic agents identified.

## 2021-02-19 NOTE — Assessment & Plan Note (Addendum)
Again she was minimally interactive, answering very few questions. She is non compliant with meds .

## 2021-02-19 NOTE — Assessment & Plan Note (Addendum)
No anemia present. Last B12 level 255 in 2016. B12 check can be offered, but she is expected to decline such.

## 2021-02-19 NOTE — Progress Notes (Signed)
   NURSING HOME LOCATION:  [facility] ROOM NUMBER:  102  CODE STATUS:  DNR  PCP: Synthia Innocent NP  This is a nursing facility follow up visit of chronic medical diagnoses & to document compliance with Regulation 483.30 (c) in The Long Term Care Survey Manual Phase 2 which mandates caregiver visit ( visits can alternate among physician, PA or NP as per statutes) within 10 days of 30 days / 60 days/ 90 days post admission to SNF date    Interim medical record and care since last SNF visit was updated with review of diagnostic studies and change in clinical status since last visit were documented.  HPI: She is a permanent resident of this facility with diagnoses of history of PTE, Parkinson's disease, PVD, chronic diastolic congestive heart failure, and dementia with psychoses. She was + for Covid early last month.D dimer was elevated ;but she was already on Xarelto (which she intermittently refuses).Marland Kitchen Antiviral not Rxed due to non compliance.No complications documented with monitor.  Review of systems: ROS could not be completed as she was essentially nonverbal.  She did voice that she was having pain in the left knee related to a fall.  She asked me to raise the head of her bed so she could sit up.  When I did so, her response was "no, the other way" indicating a supine position.  Physical exam:  Pertinent or positive findings: She appears chronically ill and malnourished.  There is profound torticollis; her right jaw essentially lies on the chest.  She is drooling constantly.  She is missing maxillary teeth.  Breath sounds are decreased.  Heart sounds are also distant.  Abdomen is protuberant.  The lower extremities appear enlarged without visible edema.  There is slightly erythematous but not cellulitic hyperpigmentation of the shins.  Pedal pulses are decreased.  She exhibits a constant "grasping" tremor of both upper extremities.  There is profound interosseous wasting the  hands.  There was no voluntary range of motion of the lower extremities.  2 small osteoma to appear present over the left patellar area.  General appearance: no acute distress, increased work of breathing is present.   Lymphatic: No lymphadenopathy about the head, neck, axilla. Eyes: No conjunctival inflammation or lid edema is present. There is no scleral icterus. Ears:  External ear exam shows no significant lesions or deformities.   Nose:  External nasal examination shows no deformity or inflammation. Nasal mucosa are pink and moist without lesions, exudates Neck:  No thyromegaly, masses, tenderness noted.    Heart:  No gallop, murmur, click, rub .  Lungs:  without wheezes, rhonchi, rales, rubs. Abdomen: Bowel sounds are normal. Abdomen is soft and nontender with no organomegaly, hernias, masses. GU: Deferred  Extremities:  No cyanosis, clubbing, edema  Neurologic exam :Balance, Rhomberg, finger to nose testing could not be completed due to clinical state Skin: Warm & dry w/o tenting.  See summary under each active problem in the Problem List with associated updated therapeutic plan

## 2021-02-19 NOTE — Patient Instructions (Signed)
See assessment and plan under each diagnosis in the problem list and acutely for this visit 

## 2021-02-20 NOTE — Assessment & Plan Note (Signed)
No evidence of "Long Covid" clinically

## 2021-03-24 ENCOUNTER — Encounter: Payer: Self-pay | Admitting: Adult Health

## 2021-03-24 ENCOUNTER — Non-Acute Institutional Stay (SKILLED_NURSING_FACILITY): Payer: No Typology Code available for payment source | Admitting: Adult Health

## 2021-03-24 DIAGNOSIS — I5032 Chronic diastolic (congestive) heart failure: Secondary | ICD-10-CM

## 2021-03-24 DIAGNOSIS — G20 Parkinson's disease: Secondary | ICD-10-CM

## 2021-03-24 DIAGNOSIS — Z86711 Personal history of pulmonary embolism: Secondary | ICD-10-CM

## 2021-03-24 NOTE — Progress Notes (Signed)
Location:  [facility] Room Number: 102-P Place of Service:  SNF (31)   CODE STATUS: DNR  Allergies  Allergen Reactions   Contrast Media [Iodinated Diagnostic Agents] Anaphylaxis and Shortness Of Breath   Vancomycin Shortness Of Breath   Vioxx [Rofecoxib] Other (See Comments)    REACTION: irregular heartbeat    Chief Complaint  Patient presents with   Medical Management of Chronic Issues           Chronic pulmonary embolism without acute cor pulmonale unspecified pulmonary embolism type:  Parkinson's disease:  Chronic diastolic CHF (congestive heart failure)    HPI:  She is a 86 year old long term resident of this facility being seen for the management of her chronic illnesses:   Chronic pulmonary embolism without acute cor pulmonale unspecified pulmonary embolism type:  Parkinson's disease: i Chronic diastolic CHF (congestive heart failure). There are no reports of uncontrolled pain. She continues to have periods of time with yelling out and banging her call light. She continues to be followed by hospice care.   Past Medical History:  Diagnosis Date   Chronic diastolic CHF (congestive heart failure) (HCC) 08/16/2009   Qualifier: Diagnosis of  By: Excell Seltzer, MD, [place]    History of pulmonary embolism    Parkinson's disease (HCC)    Venous stasis     History reviewed. No pertinent surgical history.  Social History   Socioeconomic History   Marital status: Single    Spouse name: Not on file   Number of children: Not on file   Years of education: Not on file   Highest education level: Not on file  Occupational History   Not on file  Tobacco Use   Smoking status: Never   Smokeless tobacco: Never  Vaping Use   Vaping Use: Never used  Substance and Sexual Activity   Alcohol use: No   Drug use: No   Sexual activity: Not on file  Other Topics Concern   Not on file  Social History Narrative   Not on file   Social Determinants of Health    Financial Resource Strain: Not on file  Food Insecurity: Not on file  Transportation Needs: Not on file  Physical Activity: Not on file  Stress: Not on file  Social Connections: Not on file  Intimate Partner Violence: Not on file   Family History  Problem Relation Age of Onset   Heart disease Mother        37s      VITAL SIGNS BP (!) 153/84   Pulse 66   Temp 98.7 F (37.1 C)   Resp 16   Ht 5\' 2"  (1.575 m)   Wt 131 lb 3.2 oz (59.5 kg)   SpO2 96%   BMI 24.00 kg/m   Outpatient Encounter Medications as of 03/24/2021  Medication Sig   acetaminophen (TYLENOL) 325 MG tablet Take 2 tablets (650 mg total) by mouth every 6 (six) hours as needed for mild pain, fever or headache (or Fever >/= 101).   Balsam [place]-Castor Oil (VENELEX) OINT Apply 1 application topically 3 (three) times daily. Special Instructions: Apply to bilateral buttocks qshift   NON FORMULARY Diet NAS, thin liquids   polyethylene glycol (MIRALAX / GLYCOLAX) 17 g packet Take 17 g by mouth 2 (two) times daily as needed.   rivaroxaban (XARELTO) 20 MG TABS tablet Take 1 tablet (20 mg total) by mouth daily with supper.   [DISCONTINUED] talc (ZEASORB) powder Apply 1 application topically  daily as needed.   No facility-administered encounter medications on file as of 03/24/2021.     SIGNIFICANT DIAGNOSTIC EXAMS  LABS REVIEWED PREVIOUS    05-02-20: wbc 6.7; hgb 13.4; hct 43.5; mcv 92.6 plt 221; glucose 69; bun 28; creat 0.62; k+ 4.0; na++ 139; ca 9.1 liver normal albumin 3.7   07-09-20: urine culture no growth  12-19-20: wbc 6.4; hgb 13.4; hct 41.7; mcv 89.1 plt 182; glucose 92; bun 16; creat 0.56; k+ 4.1; na++ 138; ca 8.5; GFR>60; d-dimer: 1.43 CRP 0.7    NO NEW LABS.   Review of Systems  Unable to perform ROS: Dementia (unable to participate)   Physical Exam Constitutional:      General: She is not in acute distress.    Appearance: She is well-developed. She is not diaphoretic.  Neck:     Thyroid: No  thyromegaly.  Cardiovascular:     Rate and Rhythm: Normal rate and regular rhythm.     Pulses: Normal pulses.     Heart sounds: Normal heart sounds.  Pulmonary:     Effort: Pulmonary effort is normal. No respiratory distress.     Breath sounds: Normal breath sounds.  Abdominal:     General: Bowel sounds are normal. There is no distension.     Palpations: Abdomen is soft.     Tenderness: There is no abdominal tenderness.  Musculoskeletal:     Cervical back: Neck supple.     Right lower leg: No edema.     Left lower leg: No edema.     Comments: Neck is weak has contracture Is able to move all extremities right extremity with ataxic movements      Lymphadenopathy:     Cervical: No cervical adenopathy.  Skin:    General: Skin is warm and dry.  Neurological:     Mental Status: She is alert. Mental status is at baseline.  Psychiatric:        Mood and Affect: Mood normal.      ASSESSMENT/ PLAN:  TODAY;   Chronic pulmonary embolism without acute cor pulmonale unspecified pulmonary embolism type: is stable will continue xarelto 20 mg daily; she does decline this medication at times.   2. Parkinson's disease: is without change; she has declined a neurological work up in the past.   3. Chronic diastolic CHF (congestive heart failure); is presently compensated; will monitor    PREVIOUS   4 Venous (peripheral) insufficiency: is without change   5. Protein calorie malnutrition: weight is 131 pounds; is presently without significant change.   6. Psychosis in elderly with behavioral disturbance: is without changes in status; she continues to have periods of time where she screams and yells. She will call staff names; will not take any medications to help with her mood status.   7. Dementia with psychosis: weight is 131 pounds; is presently without significant change will monitor   8. Failure to thrive in adult: is without change; is followed by hospice care.      Synthia Innocent  NP [facility]  Contact [phone] Monday through Friday 8am- 5pm  After hours call [phone]

## 2021-04-11 ENCOUNTER — Non-Acute Institutional Stay (SKILLED_NURSING_FACILITY): Payer: Medicare Other | Admitting: Adult Health

## 2021-04-11 ENCOUNTER — Encounter: Payer: Self-pay | Admitting: Adult Health

## 2021-04-11 DIAGNOSIS — I5032 Chronic diastolic (congestive) heart failure: Secondary | ICD-10-CM

## 2021-04-11 DIAGNOSIS — F03918 Unspecified dementia, unspecified severity, with other behavioral disturbance: Secondary | ICD-10-CM | POA: Diagnosis not present

## 2021-04-11 DIAGNOSIS — F0392 Unspecified dementia, unspecified severity, with psychotic disturbance: Secondary | ICD-10-CM | POA: Diagnosis not present

## 2021-04-11 DIAGNOSIS — G20 Parkinson's disease: Secondary | ICD-10-CM

## 2021-04-11 NOTE — Progress Notes (Signed)
Location:  [facility] Room Number: 102 Place of Service:  SNF (31)   CODE STATUS: dnr   Allergies  Allergen Reactions   Contrast Media [Iodinated Diagnostic Agents] Anaphylaxis and Shortness Of Breath   Vancomycin Shortness Of Breath   Vioxx [Rofecoxib] Other (See Comments)    REACTION: irregular heartbeat    Chief Complaint  Patient presents with   Acute Visit    Care plan meeting     HPI:  We have come together for her care plan meeting. BIMS 6/15 mood 0/30: yelling out; banging remote on bed; throwing food. She requires extensive assist to dependent for her adls. She is nonambulatory. She is incontinent of bladder and bowel. She has had one fall from bed without injury. Dietary: weight is 130.8 pounds is stable; regular diet; thin liquids; variable appetite. Therapy OT: self feeding through 04-07-21. She is followed by hospice care. She continues to be followed for her chronic illnesses including:  Chronic diastolic chf (congestive heart failure)  Parkinson's disease  Dementia with psychosis Psychosis in elderly with behavioral disturbance  Past Medical History:  Diagnosis Date   Chronic diastolic CHF (congestive heart failure) (HCC) 08/16/2009   Qualifier: Diagnosis of  By: Excell Seltzer, MD, [place]    History of pulmonary embolism    Parkinson's disease (HCC)    Venous stasis     No past surgical history on file.  Social History   Socioeconomic History   Marital status: Single    Spouse name: Not on file   Number of children: Not on file   Years of education: Not on file   Highest education level: Not on file  Occupational History   Not on file  Tobacco Use   Smoking status: Never   Smokeless tobacco: Never  Vaping Use   Vaping Use: Never used  Substance and Sexual Activity   Alcohol use: No   Drug use: No   Sexual activity: Not on file  Other Topics Concern   Not on file  Social History Narrative   Not on file   Social  Determinants of Health   Financial Resource Strain: Not on file  Food Insecurity: Not on file  Transportation Needs: Not on file  Physical Activity: Not on file  Stress: Not on file  Social Connections: Not on file  Intimate Partner Violence: Not on file   Family History  Problem Relation Age of Onset   Heart disease Mother        47s      VITAL SIGNS BP 129/66   Pulse 76   Temp (!) 97.1 F (36.2 C)   Ht 5\' 2"  (1.575 m)   Wt 130 lb 12.8 oz (59.3 kg)   BMI 23.92 kg/m   Outpatient Encounter Medications as of 04/11/2021  Medication Sig   acetaminophen (TYLENOL) 325 MG tablet Take 2 tablets (650 mg total) by mouth every 6 (six) hours as needed for mild pain, fever or headache (or Fever >/= 101).   Balsam [place]-Castor Oil (VENELEX) OINT Apply 1 application topically 3 (three) times daily. Special Instructions: Apply to bilateral buttocks qshift   NON FORMULARY Diet NAS, thin liquids   polyethylene glycol (MIRALAX / GLYCOLAX) 17 g packet Take 17 g by mouth 2 (two) times daily as needed.   rivaroxaban (XARELTO) 20 MG TABS tablet Take 1 tablet (20 mg total) by mouth daily with supper.   No facility-administered encounter medications on file as of 04/11/2021.  SIGNIFICANT DIAGNOSTIC EXAMS  LABS REVIEWED PREVIOUS    05-02-20: wbc 6.7; hgb 13.4; hct 43.5; mcv 92.6 plt 221; glucose 69; bun 28; creat 0.62; k+ 4.0; na++ 139; ca 9.1 liver normal albumin 3.7   07-09-20: urine culture no growth  12-19-20: wbc 6.4; hgb 13.4; hct 41.7; mcv 89.1 plt 182; glucose 92; bun 16; creat 0.56; k+ 4.1; na++ 138; ca 8.5; GFR>60; d-dimer: 1.43 CRP 0.7    NO NEW LABS.   Review of Systems  Unable to perform ROS: Dementia (unable to participate)   Physical Exam Constitutional:      General: She is not in acute distress.    Appearance: She is well-developed. She is not diaphoretic.  Neck:     Thyroid: No thyromegaly.  Cardiovascular:     Rate and Rhythm: Normal rate and regular rhythm.      Pulses: Normal pulses.     Heart sounds: Normal heart sounds.  Pulmonary:     Effort: Pulmonary effort is normal. No respiratory distress.     Breath sounds: Normal breath sounds.  Abdominal:     General: Bowel sounds are normal. There is no distension.     Palpations: Abdomen is soft.     Tenderness: There is no abdominal tenderness.  Musculoskeletal:     Cervical back: Neck supple.     Right lower leg: No edema.     Left lower leg: No edema.     Comments: Neck is weak has contracture Is able to move all extremities right extremity with ataxic movements       Lymphadenopathy:     Cervical: No cervical adenopathy.  Skin:    General: Skin is warm and dry.  Neurological:     Mental Status: She is alert. Mental status is at baseline.  Psychiatric:        Mood and Affect: Mood normal.      ASSESSMENT/ PLAN:  TODAY  Chronic diastolic chf (congestive heart failure) Parkinson's disease Dementia with psychosis Psychosis in elderly with behavioral disturbance  Will continue current medications she continues to decline xarelto Will continue current plan of care Will continue to monitor her status  She continues to be followed by hospice care Will continue to monitor her status.    Time spent with patient 40 minutes: medications; behaviors; therapy plan of care.    Synthia Innocent NP [facility]  Contact [phone] Monday through Friday 8am- 5pm  After hours call [phone]

## 2021-04-23 ENCOUNTER — Non-Acute Institutional Stay (SKILLED_NURSING_FACILITY): Payer: Medicare Other | Admitting: Adult Health

## 2021-04-23 ENCOUNTER — Encounter: Payer: Self-pay | Admitting: Adult Health

## 2021-04-23 DIAGNOSIS — E440 Moderate protein-calorie malnutrition: Secondary | ICD-10-CM | POA: Diagnosis not present

## 2021-04-23 DIAGNOSIS — I872 Venous insufficiency (chronic) (peripheral): Secondary | ICD-10-CM | POA: Diagnosis not present

## 2021-04-23 DIAGNOSIS — F03918 Unspecified dementia, unspecified severity, with other behavioral disturbance: Secondary | ICD-10-CM | POA: Diagnosis not present

## 2021-04-23 NOTE — Progress Notes (Signed)
Location:  [facility] Room Number: [id number] Place of Service:  SNF (31)   CODE STATUS: DNR  Allergies  Allergen Reactions   Contrast Media [Iodinated Diagnostic Agents] Anaphylaxis and Shortness Of Breath   Vancomycin Shortness Of Breath   Vioxx [Rofecoxib] Other (See Comments)    REACTION: irregular heartbeat    Chief Complaint  Patient presents with   Medical Management of Chronic Issues           Venous (peripheral) insufficiency: Protein calorie malnutrition:  Psychosis in elderly with behavioral disturbance:    HPI:  She is a 86 year old long term resident of this facility being seen for the management of her chronic illnesses: Venous (peripheral) insufficiency: Protein calorie malnutrition:  Psychosis in elderly with behavioral disturbance. There are no reports of uncontrolled pain;no changes in appetite; no changes in appetite. She continues to decline her medications. She continues to be followed for hospice care.   Past Medical History:  Diagnosis Date   Chronic diastolic CHF (congestive heart failure) ([place]) 08/16/2009   Qualifier: Diagnosis of  By: Burt Knack, MD, [place] Pert    History of pulmonary embolism    Parkinson's disease ([place])    Venous stasis     History reviewed. No pertinent surgical history.  Social History   Socioeconomic History   Marital status: Single    Spouse name: Not on file   Number of children: Not on file   Years of education: Not on file   Highest education level: Not on file  Occupational History   Not on file  Tobacco Use   Smoking status: Never   Smokeless tobacco: Never  Vaping Use   Vaping Use: Never used  Substance and Sexual Activity   Alcohol use: No   Drug use: No   Sexual activity: Not on file  Other Topics Concern   Not on file  Social History Narrative   Not on file   Social Determinants of Health   Financial Resource Strain: Not on file  Food Insecurity: Not on file  Transportation  Needs: Not on file  Physical Activity: Not on file  Stress: Not on file  Social Connections: Not on file  Intimate Partner Violence: Not on file   Family History  Problem Relation Age of Onset   Heart disease Mother        11s      VITAL SIGNS BP (!) 150/78   Pulse 90   Temp 98 F (36.7 C)   Resp 16   Ht 5\' 2"  (1.575 m)   Wt 130 lb 12.8 oz (59.3 kg)   SpO2 96%   BMI 23.92 kg/m   Outpatient Encounter Medications as of 04/23/2021  Medication Sig   acetaminophen (TYLENOL) 325 MG tablet Take 2 tablets (650 mg total) by mouth every 6 (six) hours as needed for mild pain, fever or headache (or Fever >/= 101).   Balsam [place]-Castor Oil (VENELEX) OINT Apply 1 application topically 3 (three) times daily. Special Instructions: Apply to bilateral buttocks qshift   NON FORMULARY Diet NAS, thin liquids   nystatin ointment (MYCOSTATIN) Apply 1 application topically 2 (two) times daily. to left back skin fold, under breast and armpit's till healed   polyethylene glycol (MIRALAX / GLYCOLAX) 17 g packet Take 17 g by mouth 2 (two) times daily as needed.   rivaroxaban (XARELTO) 20 MG TABS tablet Take 1 tablet (20 mg total) by mouth daily with supper.   No facility-administered encounter medications  on file as of 04/23/2021.     SIGNIFICANT DIAGNOSTIC EXAMS   LABS REVIEWED PREVIOUS    05-02-20: wbc 6.7; hgb 13.4; hct 43.5; mcv 92.6 plt 221; glucose 69; bun 28; creat 0.62; k+ 4.0; na++ 139; ca 9.1 liver normal albumin 3.7   07-09-20: urine culture no growth  12-19-20: wbc 6.4; hgb 13.4; hct 41.7; mcv 89.1 plt 182; glucose 92; bun 16; creat 0.56; k+ 4.1; na++ 138; ca 8.5; GFR>60; d-dimer: 1.43 CRP 0.7    NO NEW LABS.   Review of Systems  Unable to perform ROS: Dementia (unable to participate)    Physical Exam Constitutional:      General: She is not in acute distress.    Appearance: She is well-developed. She is not diaphoretic.  Neck:     Thyroid: No thyromegaly.  Cardiovascular:      Rate and Rhythm: Normal rate and regular rhythm.     Pulses: Normal pulses.     Heart sounds: Normal heart sounds.  Pulmonary:     Effort: Pulmonary effort is normal. No respiratory distress.     Breath sounds: Normal breath sounds.  Abdominal:     General: Bowel sounds are normal. There is no distension.     Palpations: Abdomen is soft.     Tenderness: There is no abdominal tenderness.  Musculoskeletal:     Cervical back: Neck supple.     Right lower leg: No edema.     Left lower leg: No edema.     Comments:  Neck is weak has contracture Is able to move all extremities right extremity with ataxic movements  Lymphadenopathy:     Cervical: No cervical adenopathy.  Skin:    General: Skin is warm and dry.  Neurological:     Mental Status: She is alert. Mental status is at baseline.  Psychiatric:        Mood and Affect: Mood normal.     ASSESSMENT/ PLAN:  TODAY;   Venous (peripheral) insufficiency: is without change  2. Protein calorie malnutrition: weight is 130 pounds; is without change  3. Psychosis in elderly with behavioral disturbance: is without change. She will not take medications; she have periods of time where she will yell out; and will hit table with call light    PREVIOUS   4. Dementia with psychosis: weight is 130 pounds; is presently without significant change will monitor   5. Failure to thrive in adult: is without change; is followed by hospice care.   6. Chronic pulmonary embolism without acute cor pulmonale unspecified pulmonary embolism type: is stable will continue xarelto 20 mg daily; she does decline this medication most of the time.   7. Parkinson's disease: is without change; she has declined a neurological work up in the past.   8. Chronic diastolic CHF (congestive heart failure); is presently compensated; will monitor      Synthia Innocent NP [facility]  Contact [phone] Monday through Friday 8am- 5pm  After hours call  [phone]

## 2021-05-29 ENCOUNTER — Encounter: Payer: Self-pay | Admitting: Internal Medicine

## 2021-05-29 ENCOUNTER — Non-Acute Institutional Stay (SKILLED_NURSING_FACILITY): Payer: Medicare Other | Admitting: Internal Medicine

## 2021-05-29 DIAGNOSIS — Z86711 Personal history of pulmonary embolism: Secondary | ICD-10-CM | POA: Diagnosis not present

## 2021-05-29 DIAGNOSIS — I5032 Chronic diastolic (congestive) heart failure: Secondary | ICD-10-CM | POA: Diagnosis not present

## 2021-05-29 DIAGNOSIS — G20 Parkinson's disease: Secondary | ICD-10-CM

## 2021-05-29 DIAGNOSIS — F0392 Unspecified dementia, unspecified severity, with psychotic disturbance: Secondary | ICD-10-CM

## 2021-05-29 NOTE — Assessment & Plan Note (Signed)
Clinically she does not have decompensated heart failure as there is no significant neck vein distention or peripheral edema.  Breath sounds are decreased; rales were not present.

## 2021-05-29 NOTE — Assessment & Plan Note (Signed)
Lifelong anticoagulation indicated because of the history of prior PTE and her bedridden state.

## 2021-05-29 NOTE — Assessment & Plan Note (Addendum)
She continues to be noncompliant with subspecialty follow-up and intermittently with medications. Apparently she took the Covid vaccinations & boosters.

## 2021-05-29 NOTE — Patient Instructions (Signed)
See assessment and plan under each diagnosis in the problem list and acutely for this visit Total time 35 minutes; greater than 50% of the visit spent counseling patient's sister and coordinating care for problems addressed at this encounter

## 2021-05-29 NOTE — Assessment & Plan Note (Addendum)
Her sister understands that the patient has been noncompliant with neurologic F/U.

## 2021-05-29 NOTE — Progress Notes (Signed)
NURSING HOME LOCATION:   [facility] ROOM NUMBER:  102P  CODE STATUS:  DNR  PCP: Ok Edwards NP  This is a nursing facility follow up visit of chronic medical diagnoses & to document compliance with Regulation 483.30 (c) in [employer] [place] Manual Phase 2 which mandates caregiver visit ( visits can alternate among physician, PA or NP as per statutes) within 10 days of 30 days / 60 days/ 90 days post admission to SNF date    Interim medical record and care since last SNF visit was updated with review of diagnostic studies and change in clinical status since last visit were documented.  HPI: She is a permanent resident of facility with medical diagnoses of chronic diastolic congestive heart failure, history of PTE, Parkinson's disease, venous stasis, dementia with psychoses, and history of protein-caloric malnutrition.  She also has history of vitamin B12 deficiency.  Most recent lab studies were almost 6 months ago when she had positive SARS coronavirus 2 screening test.  D-dimer was elevated at 1.43.  This is in the context of continuous anticoagulation with  Xarelto because of the history of PTE.  CRP was normal at 0.7.  CKD stage II was present with creatinine of 0.56 and GFR greater than 60.  CBC was normal as were indices.  Review of systems: Her sister is visiting from [place] and states that her sister has been interacting more this week.  She stated that she was even laughing intermittently.  She does describe her sister as "processing slowly".  Her sister realizes that Baljit has been noncompliant with recommendations for therapies and neurologic follow-up.  Her sister's main concern is the torticollis and if anything could be done to mediate this. When I asked if she were having problems the patient stated "knees, arm, and shoulder".  When I asked her which shoulder she extend the index finger indicating the left.  She also complained of discomfort in her  knees.  For the most part the patient remained nonverbal during the interview and exam. She did state that "they did not feed me this morning".  Her sister stated that she ate 2 hotdogs for lunch. Mental status has not been able to be assessed adequately as the patient will not participate in testing.  Physical exam:  Pertinent or positive findings: She is poorly nourished and almost cachectic.  This is most obvious in the hands where there is profound interosseous wasting.  The most striking physical finding is severe torticollis and neck flexion to the point that the patient cannot look at the examiner.  Heart rhythm is slightly irregular.  Breath sounds are decreased.  Abdomen is protuberant.  Pedal pulses were decreased and essentially not palpable.  Skin is shiny over the shins but also exhibits some hyperpigmentation.  She has a constant tremor of the right hand with repetitive extension of the right index finger.  She has fusiform changes of her knees.  General appearance: no acute distress, increased work of breathing is present.   Lymphatic: No lymphadenopathy about the head, neck, axilla. Eyes: No conjunctival inflammation or lid edema is present. There is no scleral icterus. Ears:  External ear exam shows no significant lesions or deformities.   Nose:  External nasal examination shows no deformity or inflammation. Nasal mucosa are pink and moist without lesions, exudates Neck:  No thyromegaly, masses, tenderness noted.    Heart:  No gallop, murmur, click, rub .  Lungs:  without wheezes, rhonchi,  rales, rubs. Abdomen: Bowel sounds are normal. Abdomen is soft and nontender with no organomegaly, hernias, masses. GU: Deferred  Extremities:  No cyanosis, clubbing, edema  Neurologic exam : Balance, Rhomberg, finger to nose testing could not be completed due to clinical state Skin: Warm & dry w/o tenting.  See summary under each active problem in the Problem List with associated updated  therapeutic plan

## 2021-06-24 ENCOUNTER — Encounter: Payer: Self-pay | Admitting: Adult Health

## 2021-06-24 ENCOUNTER — Non-Acute Institutional Stay (SKILLED_NURSING_FACILITY): Payer: Medicare Other | Admitting: Adult Health

## 2021-06-24 DIAGNOSIS — F0392 Unspecified dementia, unspecified severity, with psychotic disturbance: Secondary | ICD-10-CM

## 2021-06-24 DIAGNOSIS — I2782 Chronic pulmonary embolism: Secondary | ICD-10-CM | POA: Diagnosis not present

## 2021-06-24 DIAGNOSIS — R627 Adult failure to thrive: Secondary | ICD-10-CM | POA: Diagnosis not present

## 2021-06-24 NOTE — Progress Notes (Signed)
Location:  [facility] Room Number: 102-P Place of Service:  SNF (31)   CODE STATUS: DNR  Allergies  Allergen Reactions   Contrast Media [Iodinated Contrast Media] Anaphylaxis and Shortness Of Breath   Vancomycin Shortness Of Breath   Vioxx [Rofecoxib] Other (See Comments)    REACTION: irregular heartbeat    Chief Complaint  Patient presents with   Medical Management of Chronic Issues              Dementia with psychosis:   Failure to thrive in adult: . Chronic pulmonary embolism without acute cor pulmonale unspecified pulmonary embolism type:     HPI:  She is a 87 year old long term resident of this facility being seen for the management of her chronic illnesses: Dementia with psychosis:   Failure to thrive in adult: . Chronic pulmonary embolism without acute cor pulmonale unspecified pulmonary embolism type. There are no reports of uncontrolled pain; she does spend all of her time in bed per her choice.   Past Medical History:  Diagnosis Date   Chronic diastolic CHF (congestive heart failure) ([facility]) 08/16/2009   Qualifier: Diagnosis of  By: Excell Seltzer, MD, [place]    History of pulmonary embolism    Parkinson's disease ([facility])    Venous stasis     History reviewed. No pertinent surgical history.  Social History   Socioeconomic History   Marital status: Single    Spouse name: Not on file   Number of children: Not on file   Years of education: Not on file   Highest education level: Not on file  Occupational History   Not on file  Tobacco Use   Smoking status: Never   Smokeless tobacco: Never  Vaping Use   Vaping Use: Never used  Substance and Sexual Activity   Alcohol use: No   Drug use: No   Sexual activity: Not on file  Other Topics Concern   Not on file  Social History Narrative   Not on file   Social Determinants of Health   Financial Resource Strain: Not on file  Food Insecurity: Not on file  Transportation Needs: Not on file   Physical Activity: Not on file  Stress: Not on file  Social Connections: Not on file  Intimate Partner Violence: Not on file   Family History  Problem Relation Age of Onset   Heart disease Mother        36s      VITAL SIGNS BP 132/78    Pulse 68    Temp (!) 96.9 F (36.1 C) (Temporal)    Resp 16    Ht 5\' 2"  (1.575 m)    Wt 129 lb 1.6 oz (58.6 kg)    SpO2 96%    BMI 23.61 kg/m   Outpatient Encounter Medications as of 06/24/2021  Medication Sig   acetaminophen (TYLENOL) 325 MG tablet Take 2 tablets (650 mg total) by mouth every 6 (six) hours as needed for mild pain, fever or headache (or Fever >/= 101).   Balsam [place]-Castor Oil (VENELEX) OINT Apply 1 application topically 3 (three) times daily. Special Instructions: Apply to bilateral buttocks qshift   NON FORMULARY Diet: Regular, finger foods as desired   nystatin ointment (MYCOSTATIN) Apply 1 application topically 2 (two) times daily. to left back skin fold, under breast and armpit's till healed   polyethylene glycol (MIRALAX / GLYCOLAX) 17 g packet Take 17 g by mouth 2 (two) times daily as needed.  rivaroxaban (XARELTO) 20 MG TABS tablet Take 1 tablet (20 mg total) by mouth daily with supper.   No facility-administered encounter medications on file as of 06/24/2021.     SIGNIFICANT DIAGNOSTIC EXAMS   LABS REVIEWED PREVIOUS    05-02-20: wbc 6.7; hgb 13.4; hct 43.5; mcv 92.6 plt 221; glucose 69; bun 28; creat 0.62; k+ 4.0; na++ 139; ca 9.1 liver normal albumin 3.7   07-09-20: urine culture no growth  12-19-20: wbc 6.4; hgb 13.4; hct 41.7; mcv 89.1 plt 182; glucose 92; bun 16; creat 0.56; k+ 4.1; na++ 138; ca 8.5; GFR>60; d-dimer: 1.43 CRP 0.7    NO NEW LABS.   Review of Systems  Unable to perform ROS: Dementia    Physical Exam Constitutional:      General: She is not in acute distress.    Appearance: She is well-developed. She is not diaphoretic.  Neck:     Thyroid: No thyromegaly.  Cardiovascular:     Rate and  Rhythm: Normal rate and regular rhythm.     Pulses: Normal pulses.     Heart sounds: Normal heart sounds.  Pulmonary:     Effort: Pulmonary effort is normal. No respiratory distress.     Breath sounds: Normal breath sounds.  Abdominal:     General: Bowel sounds are normal. There is no distension.     Palpations: Abdomen is soft.     Tenderness: There is no abdominal tenderness.  Musculoskeletal:     Cervical back: Neck supple.     Right lower leg: No edema.     Left lower leg: No edema.     Comments: Neck is weak has contracture Is able to move all extremities right extremity with ataxic movements   Lymphadenopathy:     Cervical: No cervical adenopathy.  Skin:    General: Skin is warm and dry.  Neurological:     Mental Status: She is alert. Mental status is at baseline.  Psychiatric:        Mood and Affect: Mood normal.    ASSESSMENT/ PLAN:  TODAY;   Dementia with psychosis: weight is 129 pounds; is presently without significant change; will monitor   2. Failure to thrive in adult: is without change is followed by hospice care  3. Chronic pulmonary embolism without acute cor pulmonale unspecified pulmonary embolism type: is stable will continue xarelto 20 mg daily; she does decline this medication most of the time.    PREVIOUS   4. Parkinson's disease: is without change; she has declined a neurological work up in the past.   5. Chronic diastolic CHF (congestive heart failure); is presently compensated; will monitor   6. Venous (peripheral) insufficiency: is without change  7. Protein calorie malnutrition: weight is 129 pounds; is without change  8. Psychosis in elderly with behavioral disturbance: is without change. She will not take medications; she have periods of time where she will yell out; and will hit table with call light     Ok Edwards NP  call [phone]

## 2021-06-25 DIAGNOSIS — I2699 Other pulmonary embolism without acute cor pulmonale: Secondary | ICD-10-CM | POA: Insufficient documentation

## 2021-06-25 DIAGNOSIS — R627 Adult failure to thrive: Secondary | ICD-10-CM | POA: Insufficient documentation

## 2021-07-11 ENCOUNTER — Encounter: Payer: Self-pay | Admitting: Adult Health

## 2021-07-11 ENCOUNTER — Non-Acute Institutional Stay (SKILLED_NURSING_FACILITY): Payer: Medicare Other | Admitting: Adult Health

## 2021-07-11 DIAGNOSIS — I5032 Chronic diastolic (congestive) heart failure: Secondary | ICD-10-CM | POA: Diagnosis not present

## 2021-07-11 DIAGNOSIS — G20 Parkinson's disease: Secondary | ICD-10-CM | POA: Diagnosis not present

## 2021-07-11 DIAGNOSIS — I2782 Chronic pulmonary embolism: Secondary | ICD-10-CM

## 2021-07-11 NOTE — Progress Notes (Signed)
Location:  [place] Room Number: 102-P Place of Service:  SNF (31)   CODE STATUS: DNR  Allergies  Allergen Reactions   Contrast Media [Iodinated Contrast Media] Anaphylaxis and Shortness Of Breath   Vancomycin Shortness Of Breath   Vioxx [Rofecoxib] Other (See Comments)    REACTION: irregular heartbeat    Chief Complaint  Patient presents with   Acute Visit    Care plan meeting    HPI:  We have come together for her care plan meeting. BIMS 9/15 mood 5/30: decreased appetite; decrease energy. She spends nearly all of her time in bed. She requires limited to extensive assist with her adl care. She is incontinent of bladder and bowel. She frequently declines to take her medications. Dietary: requires assist with meals: regular diet finger foods; good appetite; weight is 137.8 pounds up 8.7 pounds. She continues to be followed for her chronic illnesses including:   Chronic diastolic congestive heart failure Parkinson's disease  Chronic pulmonary embolism without core pulmonale unspecified pulmonary embolism type    she continues to be followed by hospice care.   Past Medical History:  Diagnosis Date   Chronic diastolic CHF (congestive heart failure) ([place]) 08/16/2009   Qualifier: Diagnosis of  By: Burt Knack, MD, [place] Pert    History of pulmonary embolism    Parkinson's disease ([place])    Venous stasis     History reviewed. No pertinent surgical history.  Social History   Socioeconomic History   Marital status: Single    Spouse name: Not on file   Number of children: Not on file   Years of education: Not on file   Highest education level: Not on file  Occupational History   Not on file  Tobacco Use   Smoking status: Never   Smokeless tobacco: Never  Vaping Use   Vaping Use: Never used  Substance and Sexual Activity   Alcohol use: No   Drug use: No   Sexual activity: Not on file  Other Topics Concern   Not on file  Social History Narrative    Not on file   Social Determinants of Health   Financial Resource Strain: Not on file  Food Insecurity: Not on file  Transportation Needs: Not on file  Physical Activity: Not on file  Stress: Not on file  Social Connections: Not on file  Intimate Partner Violence: Not on file   Family History  Problem Relation Age of Onset   Heart disease Mother        7s      VITAL SIGNS BP 128/68    Pulse 64    Temp (!) 97 F (36.1 C)    Resp 16    Ht 5\' 2"  (1.575 m)    Wt 137 lb 12.8 oz (62.5 kg)    SpO2 96%    BMI 25.20 kg/m   Outpatient Encounter Medications as of 07/11/2021  Medication Sig   acetaminophen (TYLENOL) 325 MG tablet Take 2 tablets (650 mg total) by mouth every 6 (six) hours as needed for mild pain, fever or headache (or Fever >/= 101).   Balsam [place]-Castor Oil (VENELEX) OINT Apply 1 application topically 3 (three) times daily. Special Instructions: Apply to bilateral buttocks qshift   NON FORMULARY Diet: Regular, finger foods as desired   nystatin ointment (MYCOSTATIN) Apply 1 application topically 2 (two) times daily. to left back skin fold, under breast and armpit's till healed   polyethylene glycol (MIRALAX / GLYCOLAX) 17 g packet  Take 17 g by mouth 2 (two) times daily as needed.   rivaroxaban (XARELTO) 20 MG TABS tablet Take 1 tablet (20 mg total) by mouth daily with supper.   No facility-administered encounter medications on file as of 07/11/2021.     SIGNIFICANT DIAGNOSTIC EXAMS   LABS REVIEWED PREVIOUS    05-02-20: wbc 6.7; hgb 13.4; hct 43.5; mcv 92.6 plt 221; glucose 69; bun 28; creat 0.62; k+ 4.0; na++ 139; ca 9.1 liver normal albumin 3.7   07-09-20: urine culture no growth  12-19-20: wbc 6.4; hgb 13.4; hct 41.7; mcv 89.1 plt 182; glucose 92; bun 16; creat 0.56; k+ 4.1; na++ 138; ca 8.5; GFR>60; d-dimer: 1.43 CRP 0.7    NO NEW LABS.   Review of Systems  Unable to perform ROS: Dementia    Physical Exam Constitutional:      General: She is not in acute  distress.    Appearance: She is well-developed. She is not diaphoretic.  Neck:     Thyroid: No thyromegaly.  Cardiovascular:     Rate and Rhythm: Normal rate and regular rhythm.     Pulses: Normal pulses.     Heart sounds: Normal heart sounds.  Pulmonary:     Effort: Pulmonary effort is normal. No respiratory distress.     Breath sounds: Normal breath sounds.  Abdominal:     General: Bowel sounds are normal. There is no distension.     Palpations: Abdomen is soft.     Tenderness: There is no abdominal tenderness.  Musculoskeletal:     Cervical back: Neck supple.     Right lower leg: No edema.     Left lower leg: No edema.     Comments:  Neck is weak has contracture Is able to move all extremities right extremity with ataxic movements    Lymphadenopathy:     Cervical: No cervical adenopathy.  Skin:    General: Skin is warm and dry.  Neurological:     Mental Status: She is alert. Mental status is at baseline.  Psychiatric:        Mood and Affect: Mood normal.     ASSESSMENT/ PLAN:   TODAY  Chronic diastolic congestive heart failure Parkinson's disease  Chronic pulmonary embolism without core pulmonale unspecified pulmonary embolism type  Will continue current medications Will continue current plan of care Will continue to monitor her status  Time spent with patient 40 minutes: medication plan of care.   Ok Edwards NP [facility]   call [phone]

## 2021-07-23 ENCOUNTER — Non-Acute Institutional Stay (SKILLED_NURSING_FACILITY): Payer: Medicare Other | Admitting: Adult Health

## 2021-07-23 ENCOUNTER — Encounter: Payer: Self-pay | Admitting: Adult Health

## 2021-07-23 DIAGNOSIS — G20 Parkinson's disease: Secondary | ICD-10-CM

## 2021-07-23 DIAGNOSIS — I872 Venous insufficiency (chronic) (peripheral): Secondary | ICD-10-CM | POA: Diagnosis not present

## 2021-07-23 DIAGNOSIS — I5032 Chronic diastolic (congestive) heart failure: Secondary | ICD-10-CM

## 2021-07-23 NOTE — Progress Notes (Signed)
Location:  [facility] Room Number: 26 Place of Service:  SNF (31) Ok Edwards, NP  CODE STATUS: DNR  Allergies  Allergen Reactions   Contrast Media [Iodinated Contrast Media] Anaphylaxis and Shortness Of Breath   Vancomycin Shortness Of Breath   Vioxx [Rofecoxib] Other (See Comments)    REACTION: irregular heartbeat    Chief Complaint  Patient presents with   Medical Management of Chronic Issues            Parkinson's disease:  Chronic diastolic CHF (congestive heart failure) Venous (peripheral) insufficiency    HPI:  She is a 87 year old long term resident of this facility being seen for the management of her chronic illnesses:  Parkinson's disease:  Chronic diastolic CHF (congestive heart failure) Venous (peripheral) insufficiency. There are no reports of uncontrolled pain. No reports of changes in appetite; weight is stable. No report of agitation.   Past Medical History:  Diagnosis Date   Chronic diastolic CHF (congestive heart failure) ([place]) 08/16/2009   Qualifier: Diagnosis of  By: Burt Knack, MD, Clayburn Pert    History of pulmonary embolism    Parkinson's disease ([place])    Venous stasis     History reviewed. No pertinent surgical history.  Social History   Socioeconomic History   Marital status: Single    Spouse name: Not on file   Number of children: Not on file   Years of education: Not on file   Highest education level: Not on file  Occupational History   Not on file  Tobacco Use   Smoking status: Never   Smokeless tobacco: Never  Vaping Use   Vaping Use: Never used  Substance and Sexual Activity   Alcohol use: No   Drug use: No   Sexual activity: Not on file  Other Topics Concern   Not on file  Social History Narrative   Not on file   Social Determinants of Health   Financial Resource Strain: Not on file  Food Insecurity: Not on file  Transportation Needs: Not on file  Physical Activity: Not on file  Stress: Not on  file  Social Connections: Not on file  Intimate Partner Violence: Not on file   Family History  Problem Relation Age of Onset   Heart disease Mother        60s      VITAL SIGNS BP 132/64    Pulse 62    Temp (!) 97.1 F (36.2 C)    Resp 16    Ht 5\' 2"  (1.575 m)    Wt 137 lb 12.8 oz (62.5 kg)    SpO2 96%    BMI 25.20 kg/m   Outpatient Encounter Medications as of 07/23/2021  Medication Sig   acetaminophen (TYLENOL) 325 MG tablet Take 2 tablets (650 mg total) by mouth every 6 (six) hours as needed for mild pain, fever or headache (or Fever >/= 101).   Balsam [place]-Castor Oil (VENELEX) OINT Apply 1 application topically 3 (three) times daily. Special Instructions: Apply to bilateral buttocks qshift   NON FORMULARY Diet: Regular, finger foods as desired   nystatin ointment (MYCOSTATIN) Apply 1 application topically 2 (two) times daily. to left back skin fold, under breast and armpit's till healed   polyethylene glycol (MIRALAX / GLYCOLAX) 17 g packet Take 17 g by mouth 2 (two) times daily as needed.   rivaroxaban (XARELTO) 20 MG TABS tablet Take 1 tablet (20 mg total) by mouth daily with supper.   No  facility-administered encounter medications on file as of 07/23/2021.     SIGNIFICANT DIAGNOSTIC EXAMS  LABS REVIEWED PREVIOUS    05-02-20: wbc 6.7; hgb 13.4; hct 43.5; mcv 92.6 plt 221; glucose 69; bun 28; creat 0.62; k+ 4.0; na++ 139; ca 9.1 liver normal albumin 3.7   07-09-20: urine culture no growth  12-19-20: wbc 6.4; hgb 13.4; hct 41.7; mcv 89.1 plt 182; glucose 92; bun 16; creat 0.56; k+ 4.1; na++ 138; ca 8.5; GFR>60; d-dimer: 1.43 CRP 0.7    NO NEW LABS.   Review of Systems  Unable to perform ROS: Dementia   Physical Exam Constitutional:      General: She is not in acute distress.    Appearance: She is well-developed. She is not diaphoretic.  Neck:     Thyroid: No thyromegaly.  Cardiovascular:     Rate and Rhythm: Normal rate and regular rhythm.     Pulses: Normal pulses.      Heart sounds: Normal heart sounds.  Pulmonary:     Effort: Pulmonary effort is normal. No respiratory distress.     Breath sounds: Normal breath sounds.  Abdominal:     General: Bowel sounds are normal. There is no distension.     Palpations: Abdomen is soft.     Tenderness: There is no abdominal tenderness.  Musculoskeletal:     Cervical back: Neck supple.     Right lower leg: No edema.     Left lower leg: No edema.     Comments: Neck is weak has contracture Is able to move all extremities right extremity with ataxic movements    Lymphadenopathy:     Cervical: No cervical adenopathy.  Skin:    General: Skin is warm and dry.  Neurological:     Mental Status: She is alert. Mental status is at baseline.  Psychiatric:        Mood and Affect: Mood normal.     ASSESSMENT/ PLAN:  TODAY;   Parkinson's disease: no change in her status; she has declined any neurologic work up   2. Chronic diastolic CHF (congestive heart failure) is presently compensated will monitor  3. Venous (peripheral) insufficiency: no change in status    PREVIOUS   4. Protein calorie malnutrition: weight is 137 pounds; is without change  5. Psychosis in elderly with behavioral disturbance: is without change. She will not take medications; she have periods of time where she will yell out; and will hit table with call light   6. Dementia with psychosis: weight is 137 pounds; is presently without significant change; will monitor   7. Failure to thrive in adult: is without change is followed by hospice care  8. Chronic pulmonary embolism without acute cor pulmonale unspecified pulmonary embolism type: is stable will continue xarelto 20 mg daily; she does decline this medication most of the time.       Ok Edwards NP [facility]   call [phone]

## 2021-09-03 ENCOUNTER — Encounter: Payer: Self-pay | Admitting: Internal Medicine

## 2021-09-03 ENCOUNTER — Non-Acute Institutional Stay (SKILLED_NURSING_FACILITY): Payer: Medicare Other | Admitting: Internal Medicine

## 2021-09-03 DIAGNOSIS — E440 Moderate protein-calorie malnutrition: Secondary | ICD-10-CM | POA: Diagnosis not present

## 2021-09-03 DIAGNOSIS — I5032 Chronic diastolic (congestive) heart failure: Secondary | ICD-10-CM

## 2021-09-03 DIAGNOSIS — R627 Adult failure to thrive: Secondary | ICD-10-CM | POA: Diagnosis not present

## 2021-09-03 DIAGNOSIS — Z7901 Long term (current) use of anticoagulants: Secondary | ICD-10-CM

## 2021-09-04 NOTE — Assessment & Plan Note (Signed)
Etiology is multifactorial.  The main factor are her neurovascular comorbidities which impact compliance and adequate nutrition. ?

## 2021-09-04 NOTE — Assessment & Plan Note (Signed)
CHF clinically compensated; no NVD or peripheral edema. No change in present cardiac regimen indicated.  

## 2021-09-04 NOTE — Assessment & Plan Note (Signed)
Albumin and total protein are not current; but she has marked limb atrophy with profound interosseous wasting.  Dietary compliance limits addressing this issue. ?

## 2021-09-04 NOTE — Assessment & Plan Note (Signed)
DOAC maintenance is critical due to her bedbound state and high risk for recurrent PTE. ?

## 2021-09-04 NOTE — Patient Instructions (Signed)
See assessment and plan under each diagnosis in the problem list and acutely for this visit 

## 2021-09-04 NOTE — Progress Notes (Signed)
? ?  NURSING HOME LOCATION: [facility] ?ROOM NUMBER:  102 P ? ?CODE STATUS:  DNR ? ?PCP: Synthia Innocent NP,PSC ? ?This is a nursing facility follow up visit of chronic medical diagnoses & to document compliance with Regulation 483.30 (c) in The Long Term Care Survey Manual Phase 2 which mandates caregiver visit ( visits can alternate among physician, PA or NP as per statutes) within 10 days of 30 days / 60 days/ 90 days post admission to SNF date   ? ?Interim medical record and care since last SNF visit was updated with review of diagnostic studies and change in clinical status since last visit were documented. ? ?HPI: She is a permanent resident of facility with diagnoses of history of chronic diastolic congestive heart failure, history of PTE, venous stasis, advanced Parkinson's disease complicated by psychosis, CKD, and adult failure to thrive syndrome. ?Unfortunately she has declined follow-up with her Neurologist to address the Parkinson's.  She remains on Xarelto for PTE prophylaxis as she is completely bedbound. ?No current labs on record.  Most recent labs were completed in June 2022. ? ?Review of systems: Dementia precluded completion of review of systems.  She was nonverbal except for 2 sentences.  Upon awakening she stated "I think I am just sleepy".  Her only other verbal response was "thank you for coming by".  She answered no other questions.  She would not follow commands. ? ?Physical exam:  ?Pertinent or positive findings: She appears almost cachectic with facial and limb wasting.  As noted she was initially asleep without snoring or apnea.  Her head is flexed to the right with the cheek actually sitting on the shoulder suggesting torticollis.  There is faint erythema over the left cheek which suggests rosacea.  Heart sounds are markedly distant.  Breath sounds are decreased.  Pedal pulses are nonpalpable.  The skin over the shins is shiny with a bland splotchy erythematous pattern.   Feet are actually cold to touch.  She has severe interosseous wasting the hands.  Once awake she exhibited a rolling tremor of her hands. ? ?General appearance:  no acute distress, increased work of breathing is present.   ?Lymphatic: No lymphadenopathy about the head, neck, axilla. ?Eyes: No conjunctival inflammation or lid edema is present. There is no scleral icterus. ?Ears:  External ear exam shows no significant lesions or deformities.   ?Nose:  External nasal examination shows no deformity or inflammation. Nasal mucosa are pink and moist without lesions, exudates ?Oral exam:  Lips and gums are healthy appearing. There is no oropharyngeal erythema or exudate. ?Neck:  No thyromegaly, masses, tenderness noted.    ?Heart:  No gallop, murmur, click, rub .  ?Lungs: without wheezes, rhonchi, rales, rubs. ?Abdomen: Bowel sounds are normal. Abdomen is soft and nontender with no organomegaly, hernias, masses. ?GU: Deferred  ?Extremities:  No cyanosis, clubbing, edema  ?Neurologic exam :Balance, Rhomberg, finger to nose testing could not be completed due to clinical state ? ?See summary under each active problem in the Problem List with associated updated therapeutic plan ? ? ?

## 2021-09-05 ENCOUNTER — Encounter: Payer: Self-pay | Admitting: Adult Health

## 2021-09-05 ENCOUNTER — Non-Acute Institutional Stay (SKILLED_NURSING_FACILITY): Payer: Medicare Other | Admitting: Adult Health

## 2021-09-05 DIAGNOSIS — I5032 Chronic diastolic (congestive) heart failure: Secondary | ICD-10-CM | POA: Diagnosis not present

## 2021-09-05 DIAGNOSIS — G20 Parkinson's disease: Secondary | ICD-10-CM

## 2021-09-05 DIAGNOSIS — R627 Adult failure to thrive: Secondary | ICD-10-CM | POA: Diagnosis not present

## 2021-09-05 DIAGNOSIS — N182 Chronic kidney disease, stage 2 (mild): Secondary | ICD-10-CM

## 2021-09-05 NOTE — Progress Notes (Signed)
?Location:  [facility] ?Nursing Home Room Number: 102-P ?Place of Service:  SNF (31) ? ? ?CODE STATUS: DNR ? ?Allergies  ?Allergen Reactions  ? Contrast Media [Iodinated Contrast Media] Anaphylaxis and Shortness Of Breath  ? Vancomycin Shortness Of Breath  ? Vioxx [Rofecoxib] Other (See Comments)  ?  REACTION: irregular heartbeat  ? ? ?Chief Complaint  ?Patient presents with  ? Acute Visit  ?  Care plan meeting  ? ? ?HPI: ? ?We have come together for her care plan meeting. BIMS 14/15 mood 2/30: not eating well. She has been discharged from hospice care. She requires extensive to dependent for her adl care. She is incontinent of bladder and bowel. She will decline care at times. She has some irritation on her neck for which she will not allow to be treated. She spends all of her time in bed. She is declining her xarelto; however; she continues to be offered this medication daily. She is on a regular diet with finger foods she requires assistance to complete her meals; will decline help at times. Her weight is stable at 133.6 pounds. She is not being seen by therapy. She continues to be followed for her chronic illnesses including:  Chronic diastolic congestive heart failure (CHF)  Parkinson's disease  CKD (chronic kidney disease) stage 2 ?Failure to thrive in adult ? ?Past Medical History:  ?Diagnosis Date  ? Chronic diastolic CHF (congestive heart failure) ([place]) 08/16/2009  ? Qualifier: Diagnosis of  By: Burt Knack, MD, Clayburn Pert   ? History of pulmonary embolism   ? Parkinson's disease ([place])   ? Venous stasis   ? ? ?History reviewed. No pertinent surgical history. ? ?Social History  ? ?Socioeconomic History  ? Marital status: Single  ?  Spouse name: Not on file  ? Number of children: Not on file  ? Years of education: Not on file  ? Highest education level: Not on file  ?Occupational History  ? Not on file  ?Tobacco Use  ? Smoking status: Never  ? Smokeless tobacco: Never  ?Vaping Use  ? Vaping Use: Never  used  ?Substance and Sexual Activity  ? Alcohol use: No  ? Drug use: No  ? Sexual activity: Not on file  ?Other Topics Concern  ? Not on file  ?Social History Narrative  ? Not on file  ? ?Social Determinants of Health  ? ?Financial Resource Strain: Not on file  ?Food Insecurity: Not on file  ?Transportation Needs: Not on file  ?Physical Activity: Not on file  ?Stress: Not on file  ?Social Connections: Not on file  ?Intimate Partner Violence: Not on file  ? ?Family History  ?Problem Relation Age of Onset  ? Heart disease Mother   ?     90s  ? ? ? ? ?VITAL SIGNS ?BP 132/60   Pulse 68   Temp 97.7 ?F (36.5 ?C)   Resp (!) 22   Ht 5\' 2"  (1.575 m)   Wt 133 lb 9.6 oz (60.6 kg)   BMI 24.44 kg/m?  ? ?Outpatient Encounter Medications as of 09/05/2021  ?Medication Sig  ? acetaminophen (TYLENOL) 325 MG tablet Take 2 tablets (650 mg total) by mouth every 6 (six) hours as needed for mild pain, fever or headache (or Fever >/= 101).  ? Balsam Engineer, materials (VENELEX) OINT Apply 1 application topically 3 (three) times daily. Special Instructions: Apply to bilateral buttocks qshift  ? NON FORMULARY Diet: Regular, finger foods as desired  ? nystatin ointment (MYCOSTATIN)  Apply 1 application topically 2 (two) times daily. to left back skin fold, under breast and armpit's till healed  ? polyethylene glycol (MIRALAX / GLYCOLAX) 17 g packet Take 17 g by mouth 2 (two) times daily as needed.  ? rivaroxaban (XARELTO) 20 MG TABS tablet Take 1 tablet (20 mg total) by mouth daily with supper.  ? ?No facility-administered encounter medications on file as of 09/05/2021.  ? ? ? ?SIGNIFICANT DIAGNOSTIC EXAMS ? ? ?LABS REVIEWED PREVIOUS   ? ?05-02-20: wbc 6.7; hgb 13.4; hct 43.5; mcv 92.6 plt 221; glucose 69; bun 28; creat 0.62; k+ 4.0; na++ 139; ca 9.1 liver normal albumin 3.7   ?07-09-20: urine culture no growth  ?12-19-20: wbc 6.4; hgb 13.4; hct 41.7; mcv 89.1 plt 182; glucose 92; bun 16; creat 0.56; k+ 4.1; na++ 138; ca 8.5; GFR>60; d-dimer:  1.43 CRP 0.7   ? ?NO NEW LABS.  ? ?Review of Systems  ?Reason unable to perform ROS: dementia.  ? ?Physical Exam ?Constitutional:   ?   General: She is not in acute distress. ?   Appearance: She is well-developed. She is not diaphoretic.  ?Neck:  ?   Thyroid: No thyromegaly.  ?Cardiovascular:  ?   Rate and Rhythm: Normal rate and regular rhythm.  ?   Pulses: Normal pulses.  ?   Heart sounds: Normal heart sounds.  ?Pulmonary:  ?   Effort: Pulmonary effort is normal. No respiratory distress.  ?   Breath sounds: Normal breath sounds.  ?Abdominal:  ?   General: Bowel sounds are normal. There is no distension.  ?   Palpations: Abdomen is soft.  ?   Tenderness: There is no abdominal tenderness.  ?Musculoskeletal:  ?   Cervical back: Neck supple.  ?   Right lower leg: No edema.  ?   Left lower leg: No edema.  ?   Comments:  Neck is weak has contracture ?Is able to move all extremities right extremity with ataxic movements     ?Lymphadenopathy:  ?   Cervical: No cervical adenopathy.  ?Skin: ?   General: Skin is warm and dry.  ?Neurological:  ?   Mental Status: She is alert. Mental status is at baseline.  ?Psychiatric:     ?   Mood and Affect: Mood normal.  ? ? ? ?ASSESSMENT/ PLAN: ? ?TODAY ? ?Chronic diastolic congestive heart failure (CHF) ?Parkinson's disease ?CKD (chronic kidney disease) stage 2 ?Failure to thrive in adult ? ?Will continue current medications ?Will continue current plan of care ?Will continue to monitor her status.  ? ? ?Time spent with patient: 40 minutes: medications; care plan goals of care.  ? ? ?Ok Edwards NP ?[facility]  ? call [phone]  ? ?

## 2021-09-22 ENCOUNTER — Non-Acute Institutional Stay (SKILLED_NURSING_FACILITY): Payer: Medicare Other | Admitting: Adult Health

## 2021-09-22 ENCOUNTER — Encounter: Payer: Self-pay | Admitting: Adult Health

## 2021-09-22 DIAGNOSIS — F03918 Unspecified dementia, unspecified severity, with other behavioral disturbance: Secondary | ICD-10-CM

## 2021-09-22 DIAGNOSIS — E440 Moderate protein-calorie malnutrition: Secondary | ICD-10-CM | POA: Diagnosis not present

## 2021-09-22 DIAGNOSIS — F0392 Unspecified dementia, unspecified severity, with psychotic disturbance: Secondary | ICD-10-CM | POA: Diagnosis not present

## 2021-09-22 NOTE — Progress Notes (Signed)
?Location:  [facility] ?Nursing Home Room Number: 102-P ?Place of Service:  SNF (31) ? ? ?CODE STATUS: DNR ? ?Allergies  ?Allergen Reactions  ? Contrast Media [Iodinated Contrast Media] Anaphylaxis and Shortness Of Breath  ? Vancomycin Shortness Of Breath  ? Vioxx [Rofecoxib] Other (See Comments)  ?  REACTION: irregular heartbeat  ? ? ?Chief Complaint  ?Patient presents with  ? Medical Management of Chronic Issues ? ?        Protein calorie malnutrition:  Psychosis in elderly with behavioral disturbance: Dementia with psychosis   ? ? ?HPI: ? ?She is a 87 year old long term resident of this facility being seen for the management of her chronic illnesses:  Protein calorie malnutrition:  Psychosis in elderly with behavioral disturbance: Dementia with psychosis . There are no reports of uncontrolled pain; no changes in appetite; weight is without significant change.  ? ?Past Medical History:  ?Diagnosis Date  ? Chronic diastolic CHF (congestive heart failure) ([place]) 08/16/2009  ? Qualifier: Diagnosis of  By: Burt Knack, MD, Clayburn Pert   ? History of pulmonary embolism   ? Parkinson's disease ([place])   ? Venous stasis   ? ? ?History reviewed. No pertinent surgical history. ? ?Social History  ? ?Socioeconomic History  ? Marital status: Single  ?  Spouse name: Not on file  ? Number of children: Not on file  ? Years of education: Not on file  ? Highest education level: Not on file  ?Occupational History  ? Not on file  ?Tobacco Use  ? Smoking status: Never  ? Smokeless tobacco: Never  ?Vaping Use  ? Vaping Use: Never used  ?Substance and Sexual Activity  ? Alcohol use: No  ? Drug use: No  ? Sexual activity: Not on file  ?Other Topics Concern  ? Not on file  ?Social History Narrative  ? Not on file  ? ?Social Determinants of Health  ? ?Financial Resource Strain: Not on file  ?Food Insecurity: Not on file  ?Transportation Needs: Not on file  ?Physical Activity: Not on file  ?Stress: Not on file  ?Social Connections: Not  on file  ?Intimate Partner Violence: Not on file  ? ?Family History  ?Problem Relation Age of Onset  ? Heart disease Mother   ?     90s  ? ? ? ? ?VITAL SIGNS ?BP (!) 143/80   Pulse 78   Temp (!) 97.2 ?F (36.2 ?C)   Resp (!) 22   Ht 5\' 2"  (1.575 m)   Wt 133 lb 9.6 oz (60.6 kg)   BMI 24.44 kg/m?  ? ?Outpatient Encounter Medications as of 09/22/2021  ?Medication Sig  ? acetaminophen (TYLENOL) 325 MG tablet Take 2 tablets (650 mg total) by mouth every 6 (six) hours as needed for mild pain, fever or headache (or Fever >/= 101).  ? Balsam Engineer, materials (VENELEX) OINT Apply 1 application topically 3 (three) times daily. Special Instructions: Apply to bilateral buttocks qshift  ? NON FORMULARY Diet: Regular, finger foods as desired  ? nystatin ointment (MYCOSTATIN) Apply 1 application. topically 2 (two) times daily. to left back skin fold, under breast and armpit's till healed ?sparingly rt. neck,side; topical,Every Shift - PRN;  ? polyethylene glycol (MIRALAX / GLYCOLAX) 17 g packet Take 17 g by mouth 2 (two) times daily as needed.  ? rivaroxaban (XARELTO) 20 MG TABS tablet Take 1 tablet (20 mg total) by mouth daily with supper.  ? ?No facility-administered encounter medications on file as  of 09/22/2021.  ? ? ? ?SIGNIFICANT DIAGNOSTIC EXAMS ? ? ?LABS REVIEWED PREVIOUS   ? ?12-19-20: wbc 6.4; hgb 13.4; hct 41.7; mcv 89.1 plt 182; glucose 92; bun 16; creat 0.56; k+ 4.1; na++ 138; ca 8.5; GFR>60; d-dimer: 1.43 CRP 0.7   ? ?NO NEW LABS.  ? ?Review of Systems  ?Unable to perform ROS: Dementia  ? ?Physical Exam ?Constitutional:   ?   General: She is not in acute distress. ?   Appearance: She is well-developed. She is not diaphoretic.  ?Neck:  ?   Thyroid: No thyromegaly.  ?Cardiovascular:  ?   Rate and Rhythm: Normal rate and regular rhythm.  ?   Pulses: Normal pulses.  ?   Heart sounds: Normal heart sounds.  ?Pulmonary:  ?   Effort: Pulmonary effort is normal. No respiratory distress.  ?   Breath sounds: Normal breath  sounds.  ?Abdominal:  ?   General: Bowel sounds are normal. There is no distension.  ?   Palpations: Abdomen is soft.  ?   Tenderness: There is no abdominal tenderness.  ?Musculoskeletal:  ?   Cervical back: Neck supple.  ?   Right lower leg: No edema.  ?   Left lower leg: No edema.  ?   Comments:  Neck is weak has contracture ?Is able to move all extremities right extremity with ataxic movements      ?Lymphadenopathy:  ?   Cervical: No cervical adenopathy.  ?Skin: ?   General: Skin is warm and dry.  ?Neurological:  ?   Mental Status: She is alert. Mental status is at baseline.  ?Psychiatric:     ?   Mood and Affect: Mood normal.  ? ? ?ASSESSMENT/ PLAN: ? ?TODAY;  ? ?Protein calorie malnutrition: weight is 133 pounds will monitor  ? ?2. Psychosis in elderly with behavioral disturbance: she will not take medications; has not had recent behavioral issues ? ?3. Dementia with psychosis weight is 133 pounds will monitor  ? ?PREVIOUS  ? ?4. Failure to thrive in adult: is no longer followed by hospice care.  ? ?5. Chronic pulmonary embolism without acute cor pulmonale unspecified pulmonary embolism type: is stable will continue xarelto 20 mg daily; she does decline this medication most of the time.  ? ?6. Parkinson's disease: no change in her status; she has declined any neurologic work up  ? ?7. Chronic diastolic CHF (congestive heart failure) is presently compensated will monitor ? ?8. Venous (peripheral) insufficiency: no change in status  ? ? ?Will check cbc; cmp  ? ? ? ?Ok Edwards NP ?[facility]  ?call [phone]  ? ?

## 2021-09-25 ENCOUNTER — Other Ambulatory Visit (HOSPITAL_COMMUNITY)
Admission: RE | Admit: 2021-09-25 | Discharge: 2021-09-25 | Disposition: A | Discharge status: Home / Self Care | Payer: Medicare Other | Source: Skilled Nursing Facility | Attending: Adult Health | Admitting: Adult Health

## 2021-09-25 DIAGNOSIS — I5032 Chronic diastolic (congestive) heart failure: Secondary | ICD-10-CM | POA: Insufficient documentation

## 2021-09-25 LAB — COMPREHENSIVE METABOLIC PANEL
ALT: 22 U/L (ref 0–44)
AST: 22 U/L (ref 15–41)
Albumin: 3 g/dL — ABNORMAL LOW (ref 3.5–5.0)
Alkaline Phosphatase: 73 U/L (ref 38–126)
Anion gap: 5 (ref 5–15)
BUN: 31 mg/dL — ABNORMAL HIGH (ref 8–23)
CO2: 26 mmol/L (ref 22–32)
Calcium: 8.7 mg/dL — ABNORMAL LOW (ref 8.9–10.3)
Chloride: 111 mmol/L (ref 98–111)
Creatinine, Ser: 0.59 mg/dL (ref 0.44–1.00)
GFR, Estimated: >60 mL/min (ref >60)
Glucose, Bld: 63 mg/dL — ABNORMAL LOW (ref 70–99)
Potassium: 3.8 mmol/L (ref 3.5–5.1)
Sodium: 142 mmol/L (ref 135–145)
Total Bilirubin: 0.6 mg/dL (ref 0.3–1.2)
Total Protein: 5.6 g/dL — ABNORMAL LOW (ref 6.5–8.1)

## 2021-09-25 LAB — CBC
HCT: 37.9 % (ref 36.0–46.0)
Hemoglobin: 12.1 g/dL (ref 12.0–15.0)
MCH: 28.5 pg (ref 26.0–34.0)
MCHC: 31.9 g/dL (ref 30.0–36.0)
MCV: 89.4 fL (ref 80.0–100.0)
Platelets: 155 10*3/uL (ref 150–400)
RBC: 4.24 MIL/uL (ref 3.87–5.11)
RDW: 14.4 % (ref 11.5–15.5)
WBC: 4.2 10*3/uL (ref 4.0–10.5)
nRBC: 0 % (ref 0.0–0.2)

## 2021-10-07 IMAGING — DX DG KNEE 1-2V*R*
2 series · 2 of 2 positions shown · non-contrast
Comparison: Radiographs dated 02/12/2005

CLINICAL DATA: Right knee pain. The patient was found down this
morning.

EXAM:
RIGHT KNEE - 1-2 VIEW

[knee ap]
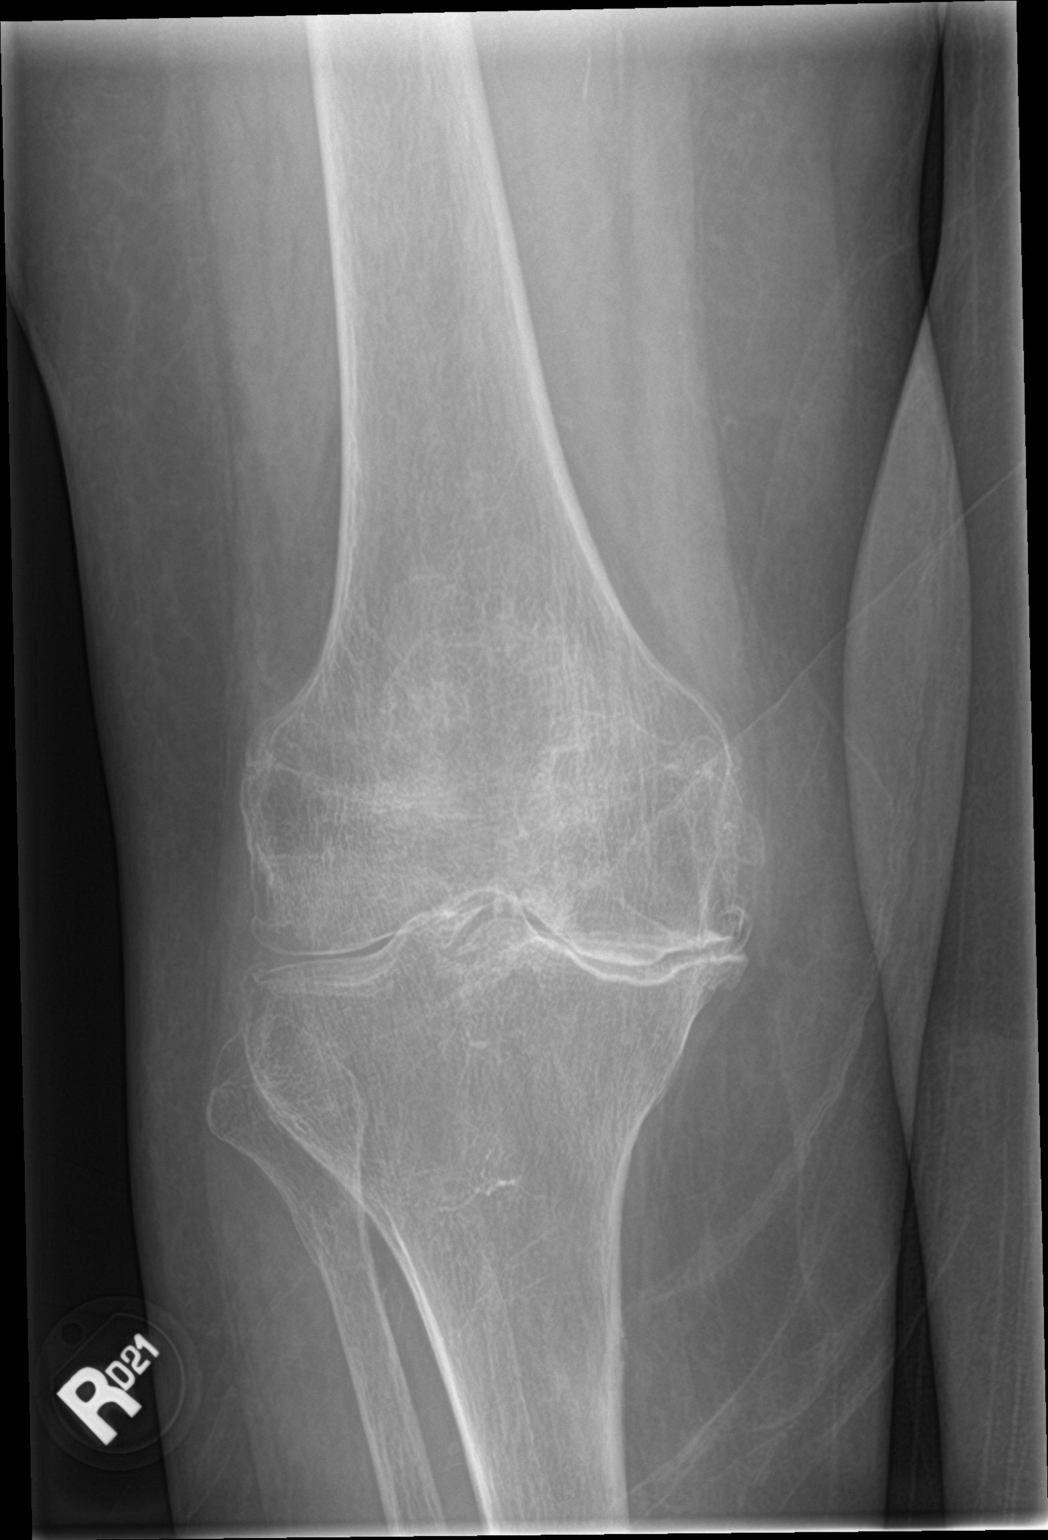

[knee lat]
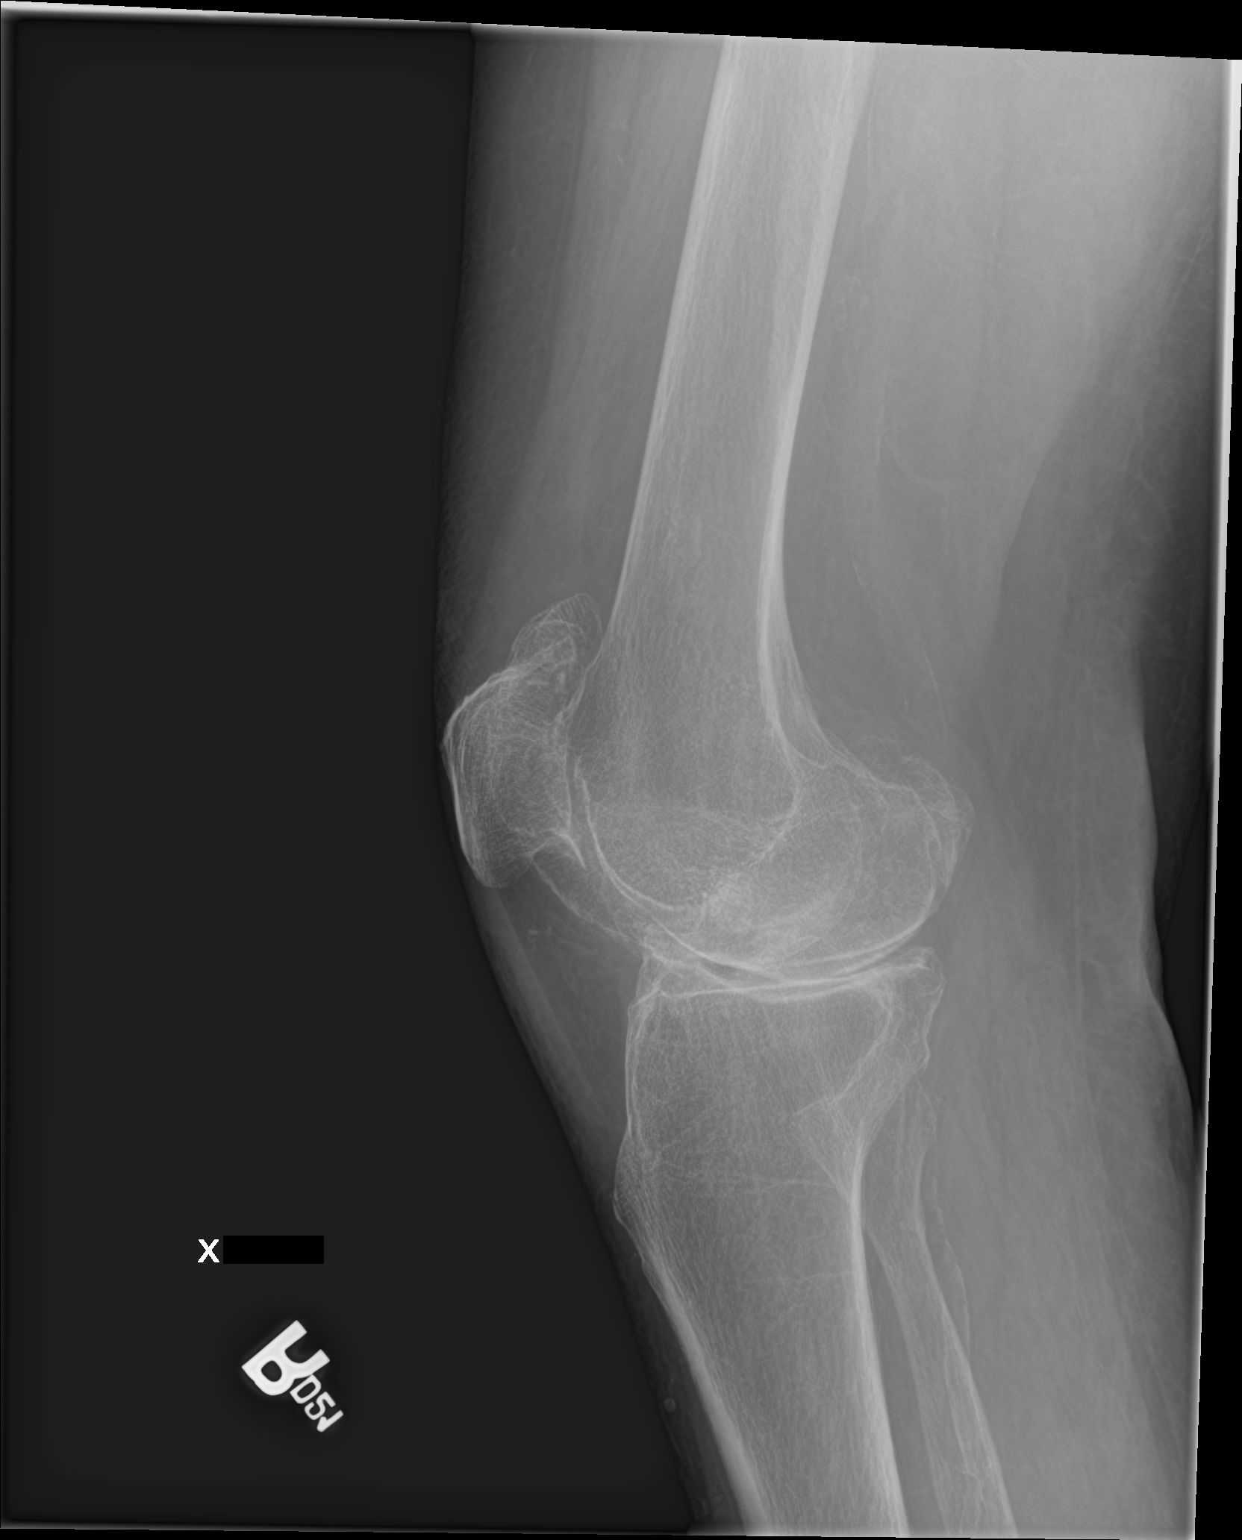

[2 of 2 positions shown; findings below may reference images not displayed]

FINDINGS: There is no fracture or dislocation or joint effusion. There is
severe osteoarthritis of the medial and patellofemoral compartments
with significant progression since 5442.
IMPRESSION: No acute abnormality. Severe osteoarthritis of the medial and
patellofemoral compartments.

## 2021-10-20 ENCOUNTER — Encounter: Payer: Self-pay | Admitting: Adult Health

## 2021-10-20 ENCOUNTER — Non-Acute Institutional Stay (SKILLED_NURSING_FACILITY): Payer: Medicare Other | Admitting: Adult Health

## 2021-10-20 DIAGNOSIS — R627 Adult failure to thrive: Secondary | ICD-10-CM | POA: Diagnosis not present

## 2021-10-20 DIAGNOSIS — G20A1 Parkinson's disease without dyskinesia, without mention of fluctuations: Secondary | ICD-10-CM

## 2021-10-20 DIAGNOSIS — Z86711 Personal history of pulmonary embolism: Secondary | ICD-10-CM | POA: Diagnosis not present

## 2021-10-20 DIAGNOSIS — G20 Parkinson's disease: Secondary | ICD-10-CM | POA: Diagnosis not present

## 2021-10-20 NOTE — Progress Notes (Signed)
?Location:  [facility] ?Nursing Home Room Number: [id number] ?Place of Service:  SNF (31) ? ? ?CODE STATUS: DNR ? ?Allergies  ?Allergen Reactions  ? Contrast Media [Iodinated Contrast Media] Anaphylaxis and Shortness Of Breath  ? Vancomycin Shortness Of Breath  ? Vioxx [Rofecoxib] Other (See Comments)  ?  REACTION: irregular heartbeat  ? ? ?Chief Complaint  ?Patient presents with  ? Medical Management of Chronic Issues  ?     ?        Failure to thrive in adult;  Chronic pulmonary embolism without acute cor pulmonale unspecified pulmonary embolism type:  Parkinson's disease: ?                         ? ? ?HPI: ? ?She is a 87 year old long term resident of this facility being seen for the management of her chronic illnesses: Failure to thrive in adult;  Chronic pulmonary embolism without acute cor pulmonale unspecified pulmonary embolism type:  Parkinson's disease. There are no reports of uncontrolled pain; her weight is stable. No reports of anxiety present.  ? ?Past Medical History:  ?Diagnosis Date  ? Chronic diastolic CHF (congestive heart failure) (HCC) 08/16/2009  ? Qualifier: Diagnosis of  By: Excell Seltzer, MD, [place]   ? History of pulmonary embolism   ? Parkinson's disease (HCC)   ? Venous stasis   ? ? ?No past surgical history on file. ? ?Social History  ? ?Socioeconomic History  ? Marital status: Single  ?  Spouse name: Not on file  ? Number of children: Not on file  ? Years of education: Not on file  ? Highest education level: Not on file  ?Occupational History  ? Not on file  ?Tobacco Use  ? Smoking status: Never  ? Smokeless tobacco: Never  ?Vaping Use  ? Vaping Use: Never used  ?Substance and Sexual Activity  ? Alcohol use: No  ? Drug use: No  ? Sexual activity: Not on file  ?Other Topics Concern  ? Not on file  ?Social History Narrative  ? Not on file  ? ?Social Determinants of Health  ? ?Financial Resource Strain: Not on file  ?Food Insecurity: Not on file  ?Transportation Needs: Not on  file  ?Physical Activity: Not on file  ?Stress: Not on file  ?Social Connections: Not on file  ?Intimate Partner Violence: Not on file  ? ?Family History  ?Problem Relation Age of Onset  ? Heart disease Mother   ?     90s  ? ? ? ? ?VITAL SIGNS ?BP (!) 154/76   Pulse (!) 58   Temp (!) 96.9 ?F (36.1 ?C) (Skin)   Ht 5\' 2"  (1.575 m)   Wt 133 lb 14.4 oz (60.7 kg)   BMI 24.49 kg/m?  ? ?Outpatient Encounter Medications as of 10/20/2021  ?Medication Sig  ? acetaminophen (TYLENOL) 325 MG tablet Take 2 tablets (650 mg total) by mouth every 6 (six) hours as needed for mild pain, fever or headache (or Fever >/= 101).  ? Balsam 12/20/2021 (VENELEX) OINT Apply 1 application topically 3 (three) times daily. Special Instructions: Apply to bilateral buttocks qshift  ? NON FORMULARY Diet: Regular, finger foods as desired  ? nystatin (MYCOSTATIN/NYSTOP) powder Apply 1 application. topically daily. Sparingly to neck  ? nystatin ointment (MYCOSTATIN) Apply 1 application. topically 2 (two) times daily. to left back skin fold, under breast and armpit's till healed ?sparingly rt. neck,side; topical,Every Shift -  PRN;  ? polyethylene glycol (MIRALAX / GLYCOLAX) 17 g packet Take 17 g by mouth 2 (two) times daily as needed.  ? rivaroxaban (XARELTO) 20 MG TABS tablet Take 1 tablet (20 mg total) by mouth daily with supper.  ? ?No facility-administered encounter medications on file as of 10/20/2021.  ? ? ? ?SIGNIFICANT DIAGNOSTIC EXAMS ? ? ?LABS REVIEWED PREVIOUS   ? ?12-19-20: wbc 6.4; hgb 13.4; hct 41.7; mcv 89.1 plt 182; glucose 92; bun 16; creat 0.56; k+ 4.1; na++ 138; ca 8.5; GFR>60; d-dimer: 1.43 CRP 0.7   ? ?TODAY ? ?09-25-21; wbc 4.2; hgb 12.1; hct 37.9; mcv 89.4 plt 155; glucose 63; bun 31; creat 0.59; k+ 3.8; na++ 142; ca 8.7; GFR >60; protein 5.6; albumin 3.0  ? ?Review of Systems  ?Unable to perform ROS: Dementia  ? ? ?Physical Exam ?Constitutional:   ?   General: She is not in acute distress. ?   Appearance: She is  well-developed. She is not diaphoretic.  ?Neck:  ?   Thyroid: No thyromegaly.  ?Cardiovascular:  ?   Rate and Rhythm: Normal rate and regular rhythm.  ?   Pulses: Normal pulses.  ?   Heart sounds: Normal heart sounds.  ?Pulmonary:  ?   Effort: Pulmonary effort is normal. No respiratory distress.  ?   Breath sounds: Normal breath sounds.  ?Abdominal:  ?   General: Bowel sounds are normal. There is no distension.  ?   Palpations: Abdomen is soft.  ?   Tenderness: There is no abdominal tenderness.  ?Musculoskeletal:  ?   Cervical back: Neck supple.  ?   Right lower leg: No edema.  ?   Left lower leg: No edema.  ?   Comments: Neck is weak has contracture ?Is able to move all extremities right extremity with ataxic movements    ?Lymphadenopathy:  ?   Cervical: No cervical adenopathy.  ?Skin: ?   General: Skin is warm and dry.  ?Neurological:  ?   Mental Status: She is alert. Mental status is at baseline.  ?Psychiatric:     ?   Mood and Affect: Mood normal.  ? ? ? ?ASSESSMENT/ PLAN: ? ?TODAY;  ? ?Failure to thrive in adult; weight is 133 pounds; is stable is no longer followed by hospice care ? ?2. Chronic pulmonary embolism without acute cor pulmonale unspecified pulmonary embolism type: will continue xarelto 20 mg daily she frequently declines this medication ? ?3. Parkinson's disease: no change: has declined any neurology workup ? ?PREVIOUS  ? ?4. Chronic diastolic CHF (congestive heart failure) is presently compensated will monitor ? ?5. Venous (peripheral) insufficiency: no change in status  ? ?6. Protein calorie malnutrition: weight is 133 pounds will monitor  ? ?7. Psychosis in elderly with behavioral disturbance: she will not take medications; has not had recent behavioral issues ? ?8. Dementia with psychosis weight is 133 pounds will monitor  ? ? ? ? ?Synthia Innocent NP ?[facility]  ?call [phone]   ?

## 2021-11-20 ENCOUNTER — Encounter: Payer: Self-pay | Admitting: Internal Medicine

## 2021-11-20 ENCOUNTER — Non-Acute Institutional Stay (SKILLED_NURSING_FACILITY): Payer: Medicare Other | Admitting: Internal Medicine

## 2021-11-20 DIAGNOSIS — Z86711 Personal history of pulmonary embolism: Secondary | ICD-10-CM

## 2021-11-20 DIAGNOSIS — I5032 Chronic diastolic (congestive) heart failure: Secondary | ICD-10-CM

## 2021-11-20 DIAGNOSIS — G20A1 Parkinson's disease without dyskinesia, without mention of fluctuations: Secondary | ICD-10-CM

## 2021-11-20 DIAGNOSIS — E43 Unspecified severe protein-calorie malnutrition: Secondary | ICD-10-CM

## 2021-11-20 DIAGNOSIS — F0392 Unspecified dementia, unspecified severity, with psychotic disturbance: Secondary | ICD-10-CM

## 2021-11-20 DIAGNOSIS — G20 Parkinson's disease: Secondary | ICD-10-CM

## 2021-11-20 NOTE — Assessment & Plan Note (Addendum)
Physical findings of limb atrophy and muscle wasting and albumin of 3.0 total protein 5.6 document severe protein/caloric malnutrition. Her sister does bring in food from a [employer] when she visits.  Today lunch had been completely consumed. Nutritionist continues to follow her here at the SNF.

## 2021-11-20 NOTE — Assessment & Plan Note (Addendum)
CHF clinically compensated; no NVD or peripheral edema. No cardiac regimen indicated.

## 2021-11-20 NOTE — Progress Notes (Signed)
NURSING HOME LOCATION:[facility] ROOM NUMBER:  [id number]  CODE STATUS: DNR   PCP:  Ok Edwards NP,PSC  This is a nursing facility follow up visit of chronic medical diagnoses & to document compliance with Regulation 483.30 (c) in The [place] Manual Phase 2 which mandates caregiver visit ( visits can alternate among physician, PA or NP as per statutes) within 10 days of 30 days / 60 days/ 90 days post admission to SNF date    Interim medical record and care since last SNF visit was updated with review of diagnostic studies and change in clinical status since last visit were documented.  HPI: She is a permanent resident of the facility with medical diagnoses of end-stage Parkinson disease complicated by dementia and behavioral issues including medication noncompliance; history of PTE; chronic diastolic congestive heart failure; PVD; CKD; history of B12 deficiency; protein/caloric malnutrition; and adult failure to thrive.  Current labs were completed 09/25/2021 and revealed prerenal azotemia with a BUN of 31.  Creatinine was 0.59 with a GFR greater than 60 indicating CKD stage II.  Albumin was 3.0 and total protein 5.6 documenting protein/caloric malnutrition. CBC was normal with MCV of 89.4. Last B12 on record was 255 on 12/20/2014.  Review of systems could not be completed as she was essentially nonverbal.  The only comments she made was "my heart is over here" pointing at the left chest as I was auscultating her heart and lungs.  Her only other verbalization was "thank you" as I left.  Physical exam:  Pertinent or positive findings: She was initially asleep with some hypopnea but without snoring or frank apnea.  She had eaten and her plate was almost totally clean.  She did wake up as I began to examine her.  At that point she began to try to adjust her glasses on her face but had great difficulty doing such, placing them @ an oblique angle.  There is mild erythema  of the lower lid rim of the right eye without frank purulence.  There is erythema of the left cheek.  The right cheek is not visible as her head essentially is flexed to the left on the right chest.Very limited visualization of oral cavity.  Heart sounds are distant but regular rhythm is noted.  Breath sounds are markedly decreased.  Abdomen is protuberant.  Pedal pulses are decreased; dorsalis pedis pulses are stronger than posterior tibial pulses.  There is no range of motion of the lower extremities.  She does have intermittent tremor of the feet, greater on the right than the left.  She also has intermittent tremor of the hands, greater on the left.  There is intermittent tremor of the lip.  The tremor in the right hand was almost intention type and flexion-like in character.  She has stasis dermatitis over her shins.  Heels are dressed.  There is profound interosseous wasting of the hands.  General appearance: no acute distress, increased work of breathing is present. Lymphatic: No lymphadenopathy about the head, neck, axilla. Eyes: No conjunctival inflammation or lid edema is present. There is no scleral icterus. Ears:  External ear exam shows no significant lesions or deformities.   Nose:  External nasal examination shows no deformity or inflammation. Nasal mucosa are pink and moist without lesions, exudates Neck:  No thyromegaly, masses, tenderness noted.    Heart:  No gallop, murmur, click, rub .  Lungs: without wheezes, rhonchi, rales, rubs. Abdomen: Bowel sounds are normal. Abdomen  is soft and nontender with no organomegaly, hernias, masses. GU: Deferred  Extremities:  No cyanosis, clubbing, edema  Neurologic exam :Balance, Rhomberg, finger to nose testing could not be completed due to clinical state Skin: Warm & dry w/o tenting.  See summary under each active problem in the Problem List with associated updated therapeutic plan

## 2021-11-20 NOTE — Patient Instructions (Signed)
.  assessm 

## 2021-11-20 NOTE — Assessment & Plan Note (Signed)
Profound neuromuscular deficits are present with torticollis and contractions.  It is clinically difficult to ascertain whether there is been any progression although her baseline is end-stage.

## 2021-11-20 NOTE — Assessment & Plan Note (Signed)
She continues to refuse her parkinsonian medications and neurologic follow-up with a subspecialist.

## 2021-11-20 NOTE — Assessment & Plan Note (Addendum)
Lifelong anticoagulation necessary because of her immobile, bedbound state.

## 2021-12-05 ENCOUNTER — Encounter: Payer: Self-pay | Admitting: Adult Health

## 2021-12-05 ENCOUNTER — Non-Acute Institutional Stay (SKILLED_NURSING_FACILITY): Payer: Medicare Other | Admitting: Adult Health

## 2021-12-05 DIAGNOSIS — F0392 Unspecified dementia, unspecified severity, with psychotic disturbance: Secondary | ICD-10-CM

## 2021-12-05 DIAGNOSIS — I5032 Chronic diastolic (congestive) heart failure: Secondary | ICD-10-CM | POA: Diagnosis not present

## 2021-12-05 DIAGNOSIS — G20 Parkinson's disease: Secondary | ICD-10-CM

## 2021-12-05 NOTE — Progress Notes (Signed)
Location:  [facility] Nursing Home Room Number: 102-P Place of Service:  SNF (31)   CODE STATUS: DNR  Allergies  Allergen Reactions   Contrast Media [Iodinated Contrast Media] Anaphylaxis and Shortness Of Breath   Vancomycin Shortness Of Breath   Vioxx [Rofecoxib] Other (See Comments)    REACTION: irregular heartbeat    Chief Complaint  Patient presents with   Acute Visit    Care plan meeting    HPI:  We have come together for her care plan meeting. BIMS 9/15 mood 0/30: yells out at times; declines care; declines heel float boots. Is bed ridden no falls. She requires extensive to dependent care for her adls. Is frequently incontinent of bladder and bowel. She will decline medications most of time. Dietary: regular diet: requires assist with meals. Regular diet: good appetite; weight is up 8 pounds to 144.4 pounds. Therapy: none at this time. She will continue to be followed for her chronic illnesses including: Chronic diastolic CHF (congestive heart failure)  Dementia with psychosis   Parkinson's disease   Past Medical History:  Diagnosis Date   Chronic diastolic CHF (congestive heart failure) (HCC) 08/16/2009   Qualifier: Diagnosis of  By: Excell Seltzer, MD, [place]    History of pulmonary embolism    Parkinson's disease (HCC)    Venous stasis     History reviewed. No pertinent surgical history.  Social History   Socioeconomic History   Marital status: Single    Spouse name: Not on file   Number of children: Not on file   Years of education: Not on file   Highest education level: Not on file  Occupational History   Not on file  Tobacco Use   Smoking status: Never   Smokeless tobacco: Never  Vaping Use   Vaping Use: Never used  Substance and Sexual Activity   Alcohol use: No   Drug use: No   Sexual activity: Not on file  Other Topics Concern   Not on file  Social History Narrative   Not on file   Social Determinants of Health   Financial  Resource Strain: Not on file  Food Insecurity: Not on file  Transportation Needs: Not on file  Physical Activity: Not on file  Stress: Not on file  Social Connections: Not on file  Intimate Partner Violence: Not on file   Family History  Problem Relation Age of Onset   Heart disease Mother        56s      VITAL SIGNS BP 140/71   Pulse 74   Temp 97.6 F (36.4 C)   Resp (!) 22   Ht 5\' 2"  (1.575 m)   Wt 144 lb 6.4 oz (65.5 kg)   BMI 26.41 kg/m   Outpatient Encounter Medications as of 12/05/2021  Medication Sig   acetaminophen (TYLENOL) 325 MG tablet Take 2 tablets (650 mg total) by mouth every 6 (six) hours as needed for mild pain, fever or headache (or Fever >/= 101).   Balsam [place]-Castor Oil (VENELEX) OINT Apply 1 application  topically 3 (three) times daily as needed. Special Instructions: Apply to bilateral buttocks qshift   NON FORMULARY Diet: Regular, finger foods as desired   nystatin (MYCOSTATIN/NYSTOP) powder Apply 1 application. topically daily. Sparingly to neck   nystatin ointment (MYCOSTATIN) Apply 1 application. topically 2 (two) times daily. to left back skin fold, under breast and armpit's till healed sparingly rt. neck,side; topical,Every Shift - PRN;   polyethylene glycol (MIRALAX / GLYCOLAX)  17 g packet Take 17 g by mouth 2 (two) times daily as needed.   rivaroxaban (XARELTO) 20 MG TABS tablet Take 1 tablet (20 mg total) by mouth daily with supper.   No facility-administered encounter medications on file as of 12/05/2021.     SIGNIFICANT DIAGNOSTIC EXAMS  LABS REVIEWED PREVIOUS    12-19-20: wbc 6.4; hgb 13.4; hct 41.7; mcv 89.1 plt 182; glucose 92; bun 16; creat 0.56; k+ 4.1; na++ 138; ca 8.5; GFR>60; d-dimer: 1.43 CRP 0.7   09-25-21; wbc 4.2; hgb 12.1; hct 37.9; mcv 89.4 plt 155; glucose 63; bun 31; creat 0.59; k+ 3.8; na++ 142; ca 8.7; GFR >60; protein 5.6; albumin 3.0  NO NEW LABS.    Review of Systems  Unable to perform ROS: Dementia   Physical  Exam Constitutional:      General: She is not in acute distress.    Appearance: She is well-developed. She is not diaphoretic.  Neck:     Thyroid: No thyromegaly.  Cardiovascular:     Rate and Rhythm: Normal rate and regular rhythm.     Pulses: Normal pulses.     Heart sounds: Normal heart sounds.  Pulmonary:     Effort: Pulmonary effort is normal. No respiratory distress.     Breath sounds: Normal breath sounds.  Abdominal:     General: Bowel sounds are normal. There is no distension.     Palpations: Abdomen is soft.     Tenderness: There is no abdominal tenderness.  Musculoskeletal:     Cervical back: Neck supple.     Right lower leg: No edema.     Left lower leg: No edema.     Comments: : Neck is weak has contracture Is able to move all extremities right extremity with ataxic movements  Lymphadenopathy:     Cervical: No cervical adenopathy.  Skin:    General: Skin is warm and dry.  Neurological:     Mental Status: She is alert. Mental status is at baseline.  Psychiatric:        Mood and Affect: Mood normal.      ASSESSMENT/ PLAN:  TODAY  Chronic diastolic CHF (congestive heart failure) Dementia with psychosis  Parkinson's disease   Will continue current medications Will continue current  plan of care  Will continue to monitor her status.   Time spent with patient: 40 minutes: medications; dietary; plan of care.    Synthia Innocent NP [facility]  call [phone]

## 2021-12-22 ENCOUNTER — Encounter: Payer: Self-pay | Admitting: Adult Health

## 2021-12-22 ENCOUNTER — Non-Acute Institutional Stay (SKILLED_NURSING_FACILITY): Payer: Medicare Other | Admitting: Adult Health

## 2021-12-22 DIAGNOSIS — E43 Unspecified severe protein-calorie malnutrition: Secondary | ICD-10-CM | POA: Diagnosis not present

## 2021-12-22 DIAGNOSIS — I872 Venous insufficiency (chronic) (peripheral): Secondary | ICD-10-CM | POA: Diagnosis not present

## 2021-12-22 DIAGNOSIS — I5032 Chronic diastolic (congestive) heart failure: Secondary | ICD-10-CM

## 2021-12-22 NOTE — Progress Notes (Unsigned)
  Location:  [facility] Room Number: 102/P Place of Service:  SNF (31)   CODE STATUS: DNR  Allergies  Allergen Reactions   Contrast Media [Iodinated Contrast Media] Anaphylaxis and Shortness Of Breath   Vancomycin Shortness Of Breath   Vioxx [Rofecoxib] Other (See Comments)    REACTION: irregular heartbeat    Chief Complaint  Patient presents with   Medical Management of Chronic Issues    Routine visit.     HPI:    Past Medical History:  Diagnosis Date   Chronic diastolic CHF (congestive heart failure) (HCC) 08/16/2009   Qualifier: Diagnosis of  By: Excell Seltzer, MD, [place]    History of pulmonary embolism    Parkinson's disease (HCC)    Venous stasis     History reviewed. No pertinent surgical history.  Social History   Socioeconomic History   Marital status: Single    Spouse name: Not on file   Number of children: Not on file   Years of education: Not on file   Highest education level: Not on file  Occupational History   Not on file  Tobacco Use   Smoking status: Never   Smokeless tobacco: Never  Vaping Use   Vaping Use: Never used  Substance and Sexual Activity   Alcohol use: No   Drug use: No   Sexual activity: Not on file  Other Topics Concern   Not on file  Social History Narrative   Not on file   Social Determinants of Health   Financial Resource Strain: Not on file  Food Insecurity: Not on file  Transportation Needs: Not on file  Physical Activity: Not on file  Stress: Not on file  Social Connections: Not on file  Intimate Partner Violence: Not on file   Family History  Problem Relation Age of Onset   Heart disease Mother        47s      VITAL SIGNS BP 132/70   Pulse 64   Temp 97.8 F (36.6 C)   Resp 20   Ht 5\' 2"  (1.575 m)   Wt 139 lb 9.6 oz (63.3 kg)   SpO2 93%   BMI 25.53 kg/m   Outpatient Encounter Medications as of 12/22/2021  Medication Sig   acetaminophen (TYLENOL) 325 MG tablet Take 2  tablets (650 mg total) by mouth every 6 (six) hours as needed for mild pain, fever or headache (or Fever >/= 101).   Balsam [place]-Castor Oil (VENELEX) OINT Apply 1 application  topically 3 (three) times daily as needed. Apply to mid back/spine qshift for prevention   NON FORMULARY Diet: Regular, finger foods as desired   nystatin (MYCOSTATIN/NYSTOP) powder Apply 1 application  topically daily as needed (Sparingly right, neck).   nystatin ointment (MYCOSTATIN) Apply 1 application  topically 2 (two) times daily as needed (To left back skin fold, under breast and armpit's).   polyethylene glycol (MIRALAX / GLYCOLAX) 17 g packet Take 17 g by mouth 2 (two) times daily as needed.   rivaroxaban (XARELTO) 20 MG TABS tablet Take 1 tablet (20 mg total) by mouth daily with supper.   No facility-administered encounter medications on file as of 12/22/2021.     SIGNIFICANT DIAGNOSTIC EXAMS       ASSESSMENT/ PLAN:     02/22/2022 NP [facility]  Contact [phone] Monday through Friday 8am- 5pm  After hours call [phone]

## 2022-01-21 ENCOUNTER — Non-Acute Institutional Stay (SKILLED_NURSING_FACILITY): Payer: Medicare Other | Admitting: Adult Health

## 2022-01-21 ENCOUNTER — Encounter: Payer: Self-pay | Admitting: Adult Health

## 2022-01-21 DIAGNOSIS — F03918 Unspecified dementia, unspecified severity, with other behavioral disturbance: Secondary | ICD-10-CM

## 2022-01-21 DIAGNOSIS — F0392 Unspecified dementia, unspecified severity, with psychotic disturbance: Secondary | ICD-10-CM | POA: Diagnosis not present

## 2022-01-21 DIAGNOSIS — R627 Adult failure to thrive: Secondary | ICD-10-CM

## 2022-01-21 NOTE — Progress Notes (Signed)
Location:  [facility] Room Number: 102 Place of Service:  SNF (31) Provider:  Synthia Innocent, NP  CODE STATUS: DNR  Allergies  Allergen Reactions   Contrast Media [Iodinated Contrast Media] Anaphylaxis and Shortness Of Breath   Vancomycin Shortness Of Breath   Vioxx [Rofecoxib] Other (See Comments)    REACTION: irregular heartbeat    Chief Complaint  Patient presents with   Medical Management of Chronic Issues                                 Psychosis in elderly with behavioral disturbance:    Dementia with psychosis  Failure to thrive in adult:    HPI:  She is a 87 year Green long term resident of this facility being seen for the management of her chronic illnesses:    Psychosis in elderly with behavioral disturbance:    Dementia with psychosis  Failure to thrive in adult. There are no reports of recent behavioral disturbances. There are no indications of pain. Her weight is stable.   Past Medical History:  Diagnosis Date   Chronic diastolic CHF (congestive heart failure) (HCC) 08/16/2009   Qualifier: Diagnosis of  By: Excell Seltzer, MD, [place]    History of pulmonary embolism    Parkinson's disease (HCC)    Venous stasis     History reviewed. No pertinent surgical history.  Social History   Socioeconomic History   Marital status: Single    Spouse name: Not on file   Number of children: Not on file   Years of education: Not on file   Highest education level: Not on file  Occupational History   Not on file  Tobacco Use   Smoking status: Never   Smokeless tobacco: Never  Vaping Use   Vaping Use: Never used  Substance and Sexual Activity   Alcohol use: No   Drug use: No   Sexual activity: Not on file  Other Topics Concern   Not on file  Social History Narrative   Not on file   Social Determinants of Health   Financial Resource Strain: Not on file  Food Insecurity: Not on file  Transportation Needs: Not on file  Physical Activity:  Not on file  Stress: Not on file  Social Connections: Not on file  Intimate Partner Violence: Not on file   Family History  Problem Relation Age of Onset   Heart disease Mother        31s      VITAL SIGNS BP (!) 142/70   Pulse 70   Temp 97.8 F (36.6 C)   Resp 20   Ht 5\' 2"  (1.575 m)   Wt 139 lb 9.6 oz (63.3 kg)   SpO2 98%   BMI 25.53 kg/m   Outpatient Encounter Medications as of 01/21/2022  Medication Sig   acetaminophen (TYLENOL) 325 MG tablet Take 2 tablets (650 mg total) by mouth every 6 (six) hours as needed for mild pain, fever or headache (or Fever >/= 101).   miconazole (MICOTIN) 2 % powder Apply topically as needed for itching. under right side of neck sparingly; topical   NON FORMULARY Diet: Regular, finger foods as desired   nystatin (MYCOSTATIN/NYSTOP) powder Apply 1 application  topically daily as needed (Sparingly right, neck).   polyethylene glycol (MIRALAX / GLYCOLAX) 17 g packet Take 17 g by mouth 2 (two) times daily as needed.   rivaroxaban (XARELTO)  20 MG TABS tablet Take 1 tablet (20 mg total) by mouth daily with supper.   [DISCONTINUED] Balsam [place]-Castor Oil (VENELEX) OINT Apply 1 application  topically 3 (three) times daily as needed. Apply to mid back/spine qshift for prevention   [DISCONTINUED] nystatin ointment (MYCOSTATIN) Apply 1 application  topically 2 (two) times daily as needed (To left back skin fold, under breast and armpit's).   No facility-administered encounter medications on file as of 01/21/2022.     SIGNIFICANT DIAGNOSTIC EXAMS   LABS REVIEWED PREVIOUS    09-25-21; wbc 4.2; hgb 12.1; hct 37.9; mcv 89.4 plt 155; glucose 63; bun 31; creat 0.59; k+ 3.8; na++ 142; ca 8.7; GFR >60; protein 5.6; albumin 3.0   NO NEW LABS.   Review of Systems  Unable to perform ROS: Dementia    Physical Exam Constitutional:      General: She is not in acute distress.    Appearance: She is well-developed. She is not diaphoretic.  Neck:     Thyroid:  No thyromegaly.  Cardiovascular:     Rate and Rhythm: Normal rate and regular rhythm.     Pulses: Normal pulses.     Heart sounds: Normal heart sounds.  Pulmonary:     Effort: Pulmonary effort is normal. No respiratory distress.     Breath sounds: Normal breath sounds.  Abdominal:     General: Bowel sounds are normal. There is no distension.     Palpations: Abdomen is soft.     Tenderness: There is no abdominal tenderness.  Musculoskeletal:     Cervical back: Neck supple.     Right lower leg: Edema present.     Left lower leg: Edema present.     Comments:  Neck is weak has contracture Is able to move all extremities right extremity with ataxic movements  Trace bilateral lower extremity edema    Lymphadenopathy:     Cervical: No cervical adenopathy.  Skin:    General: Skin is warm and dry.  Neurological:     Mental Status: She is alert. Mental status is at baseline.  Psychiatric:        Mood and Affect: Mood normal.       ASSESSMENT/ PLAN:  TODAY;   Psychosis in elderly with behavioral disturbance: she will not take medications without recent behavioral disturbance  2. Dementia with psychosis weight is 139 pounds  3. Failure to thrive in adult: weight is 139 pounds is without change    PREVIOUS   4. Chronic pulmonary embolism without acute cor pulmonale unspecified pulmonary embolism type: will continue xarelto 20 mg daily she frequently declines this medication  5. Parkinson's disease: no change: has declined any neurology workup  6. Chronic diastolic CHF (congestive heart failure) is presently compensated off medications  7. Venous (peripheral) insufficiency: without change in status.   8. Protein calorie malnutrition albumin is 3.0; weight is 139 pounds.         Synthia Innocent NP [facility]   call [phone]

## 2022-02-11 ENCOUNTER — Encounter: Payer: Self-pay | Admitting: Adult Health

## 2022-02-11 ENCOUNTER — Non-Acute Institutional Stay (SKILLED_NURSING_FACILITY): Payer: Medicare Other | Admitting: Adult Health

## 2022-02-11 DIAGNOSIS — Z0000 Encounter for general adult medical examination without abnormal findings: Secondary | ICD-10-CM

## 2022-02-11 NOTE — Patient Instructions (Signed)
  Ms. Stanczyk , Thank you for taking time to come for your Medicare Wellness Visit. I appreciate your ongoing commitment to your health goals. Please review the following plan we discussed and let me know if I can assist you in the future.   These are the goals we discussed:  Goals      DIET - INCREASE WATER INTAKE     Follow up with Provider as scheduled     General - Client will not be readmitted within 30 days ([facility])     Have 3 meals a day        This is a list of the screening recommended for you and due dates:  Health Maintenance  Topic Date Due   Flu Shot  01/20/2022   Pneumonia Vaccine (2 - PCV) 06/22/2022*   HPV Vaccine  Aged Out   DEXA scan (bone density measurement)  Discontinued   Tetanus Vaccine  Discontinued   COVID-19 Vaccine  Discontinued   Zoster (Shingles) Vaccine  Discontinued  *Topic was postponed. The date shown is not the original due date.

## 2022-02-11 NOTE — Progress Notes (Signed)
Subjective:   [name] is a 87 y.o. female who presents for Medicare Annual (Subsequent) preventive examination.  Review of Systems    Review of Systems  Unable to perform ROS: Dementia    Cardiac Risk Factors include: advanced age (>41men, >75 women);sedentary lifestyle     Objective:    Today's Vitals   02/11/22 1326  BP: 130/76  Pulse: 72  Resp: (!) 22  Temp: 97.6 F (36.4 C)  SpO2: 98%  Weight: 139 lb 9.6 oz (63.3 kg)  Height: 5\' 2"  (1.575 m)   Body mass index is 25.53 kg/m.     02/11/2022    1:27 PM 01/21/2022    9:01 AM 12/22/2021    9:41 AM 12/05/2021    9:33 AM 09/22/2021    8:55 AM 07/23/2021   11:34 AM 07/11/2021   10:43 AM  Advanced Directives  Does Patient Have a Medical Advance Directive? Yes Yes Yes Yes Yes Yes Yes  Type of Advance Directive Out of facility DNR (pink MOST or yellow form) Out of facility DNR (pink MOST or yellow form) Out of facility DNR (pink MOST or yellow form) Out of facility DNR (pink MOST or yellow form) Out of facility DNR (pink MOST or yellow form) Out of facility DNR (pink MOST or yellow form) Out of facility DNR (pink MOST or yellow form)  Does patient want to make changes to medical advance directive? No - Patient declined No - Patient declined No - Patient declined No - Patient declined No - Patient declined No - Patient declined No - Patient declined  Pre-existing out of facility DNR order (yellow form or pink MOST form) Yellow form placed in chart (order not valid for inpatient use) Yellow form placed in chart (order not valid for inpatient use) Yellow form placed in chart (order not valid for inpatient use) Yellow form placed in chart (order not valid for inpatient use) Yellow form placed in chart (order not valid for inpatient use)  Yellow form placed in chart (order not valid for inpatient use)    Current Medications (verified) Outpatient Encounter Medications as of 02/11/2022  Medication Sig   acetaminophen (TYLENOL) 325 MG  tablet Take 2 tablets (650 mg total) by mouth every 6 (six) hours as needed for mild pain, fever or headache (or Fever >/= 101).   NON FORMULARY Diet: Regular, finger foods as desired   nystatin (MYCOSTATIN/NYSTOP) powder Apply 1 application  topically daily as needed (Sparingly right, neck).   polyethylene glycol (MIRALAX / GLYCOLAX) 17 g packet Take 17 g by mouth 2 (two) times daily as needed.   rivaroxaban (XARELTO) 20 MG TABS tablet Take 1 tablet (20 mg total) by mouth daily with supper.   talc (ZEASORB) powder under right side of neck, Every Shif   [DISCONTINUED] miconazole (MICOTIN) 2 % powder Apply topically as needed for itching. under right side of neck sparingly; topical   No facility-administered encounter medications on file as of 02/11/2022.    Allergies (verified) Contrast media [iodinated contrast media], Vancomycin, and Vioxx [rofecoxib]   History: Past Medical History:  Diagnosis Date   Chronic diastolic CHF (congestive heart failure) (HCC) 08/16/2009   Qualifier: Diagnosis of  By: 08/18/2009, MD, Excell Seltzer    History of pulmonary embolism    Parkinson's disease (HCC)    Venous stasis    History reviewed. No pertinent surgical history. Family History  Problem Relation Age of Onset   Heart disease Mother  90s   Social History   Socioeconomic History   Marital status: Single    Spouse name: Not on file   Number of children: Not on file   Years of education: Not on file   Highest education level: Not on file  Occupational History   Not on file  Tobacco Use   Smoking status: Never   Smokeless tobacco: Never  Vaping Use   Vaping Use: Never used  Substance and Sexual Activity   Alcohol use: No   Drug use: No   Sexual activity: Not on file  Other Topics Concern   Not on file  Social History Narrative   Not on file   Social Determinants of Health   Financial Resource Strain: Not on file  Food Insecurity: Not on file  Transportation Needs: Not on  file  Physical Activity: Not on file  Stress: Not on file  Social Connections: Not on file    Tobacco Counseling Counseling given: Not Answered   Clinical Intake:  Pre-visit preparation completed: Yes  Pain : No/denies pain     BMI - recorded: 25.53 Nutritional Status: BMI 25 -29 Overweight Nutritional Risks: Unintentional weight loss Diabetes: No  How often do you need to have someone help you when you read instructions, pamphlets, or other written materials from your doctor or pharmacy?: 5 - Always  Diabetic?no  Interpreter Needed?: No      Activities of Daily Living    02/11/2022    1:43 PM  In your present state of health, do you have any difficulty performing the following activities:  Hearing? 0  Vision? 0  Difficulty concentrating or making decisions? 1  Walking or climbing stairs? 1  Dressing or bathing? 1  Doing errands, shopping? 1  Preparing Food and eating ? Y  Using the Toilet? Y  In the past six months, have you accidently leaked urine? Y  Do you have problems with loss of bowel control? Y  Managing your Medications? Y  Managing your Finances? Y  Housekeeping or managing your Housekeeping? Y    Patient Care Team: Sharee Holster, NP as PCP - [facility] (Geriatric [facility]  Indicate any recent Medical Services you may have received from other than [facility] providers in the past year (date may be approximate).     Assessment:   This is a routine wellness examination for [name].  Hearing/Vision screen No results found.  Dietary issues and exercise activities discussed: Current Exercise Habits: The patient does not participate in regular exercise at present, Exercise limited by: orthopedic condition(s)   Goals Addressed             This Visit's Progress    DIET - INCREASE WATER INTAKE   Not on track    Follow up with Provider as scheduled   On track    General - Client will not be  readmitted within 30 days ([facility])   On track    Have 3 meals a day   No change      Depression Screen    02/11/2022    1:43 PM 09/24/2021    3:21 PM 07/11/2021    3:12 PM 02/10/2021    2:53 PM 02/05/2020   11:49 AM  PHQ 2/9 Scores  Exception Documentation Patient refusal Patient refusal Patient refusal Patient refusal Patient refusal    Fall Risk    02/11/2022    1:43 PM 09/24/2021    3:22 PM 07/28/2021  1:50 PM 07/11/2021    3:13 PM 02/10/2021    2:52 PM  Fall Risk   Falls in the past year? 1 1 1 1 1   Number falls in past yr: 1 1 1  0 0  Injury with Fall? 0 0 0 0 0  Risk for fall due to : Impaired balance/gait;History of fall(s);Impaired mobility Impaired balance/gait;History of fall(s);Impaired mobility History of fall(s);Impaired balance/gait;Impaired mobility Impaired balance/gait;History of fall(s);Impaired mobility Impaired balance/gait;Impaired mobility  Follow up     Falls evaluation completed    FALL RISK PREVENTION PERTAINING TO THE HOME:  Any stairs in or around the home? Yes  If so, are there any without handrails? No  Home free of loose throw rugs in walkways, pet beds, electrical cords, etc? Yes  Adequate lighting in your home to reduce risk of falls? Yes   ASSISTIVE DEVICES UTILIZED TO PREVENT FALLS:  Life alert? No  Use of a cane, walker or w/c? No  Grab bars in the bathroom? Yes  Shower chair or bench in shower? Yes  Elevated toilet seat or a handicapped toilet? Yes   TIMED UP AND GO:  Was the test performed? No . Is nonambulatory     Cognitive Function:    02/11/2022    1:44 PM 02/10/2021    2:54 PM  MMSE - Mini Mental State Exam  Not completed: Unable to complete Unable to complete        Immunizations Immunization History  Administered Date(s) Administered   Pneumococcal Polysaccharide-23 06/23/2007    TDAP status: Up to date  Flu Vaccine status: Up to date  Pneumococcal vaccine status: Up to date  Covid-19 vaccine status: Completed  vaccines  Qualifies for Shingles Vaccine? Yes   Zostavax completed No   Shingrix Completed?: Yes  Screening Tests Health Maintenance  Topic Date Due   INFLUENZA VACCINE  01/20/2022   Pneumonia Vaccine 48+ Years old (2 - PCV) 06/22/2022 (Originally 06/22/2008)   HPV VACCINES  Aged Out   DEXA SCAN  Discontinued   TETANUS/TDAP  Discontinued   COVID-19 Vaccine  Discontinued   Zoster Vaccines- Shingrix  Discontinued    Health Maintenance  Health Maintenance Due  Topic Date Due   INFLUENZA VACCINE  01/20/2022    Colorectal cancer screening: No longer required.   Mammogram status: No longer required due to age.  Declined dexa scan   Lung Cancer Screening: (Low Dose CT Chest recommended if Age 39-80 years, 30 pack-year currently smoking OR have quit w/in 15years.) does not qualify.   Lung Cancer Screening Referral: n/a  Additional Screening:  Hepatitis C Screening: does not qualify; Completed   Vision Screening: Recommended annual ophthalmology exams for early detection of glaucoma and other disorders of the eye. Is the patient up to date with their annual eye exam?  Yes  Who is the provider or what is the name of the office in which the patient attends annual eye exams?  If pt is not established with a provider, would they like to be referred to a provider to establish care? No .   Dental Screening: Recommended annual dental exams for proper oral hygiene  Community Resource Referral / Chronic Care Management: CRR required this visit?  No   CCM required this visit?  No      Plan:     I have personally reviewed and noted the following in the patient's chart:   Medical and social history Use of alcohol, tobacco or illicit drugs  Current medications and supplements  including opioid prescriptions. Patient is not currently taking opioid prescriptions. Functional ability and status Nutritional status Physical activity Advanced directives List of other  physicians Hospitalizations, surgeries, and ER visits in previous 12 months Vitals Screenings to include cognitive, depression, and falls Referrals and appointments  In addition, I have reviewed and discussed with patient certain preventive protocols, quality metrics, and best practice recommendations. A written personalized care plan for preventive services as well as general preventive health recommendations were provided to patient.     Sharee Holster, NP   02/11/2022   Nurse Notes: the exam was completed by me at this facility.

## 2022-03-05 ENCOUNTER — Encounter: Payer: Self-pay | Admitting: Adult Health

## 2022-03-05 ENCOUNTER — Non-Acute Institutional Stay (SKILLED_NURSING_FACILITY): Payer: Medicare Other | Admitting: Adult Health

## 2022-03-05 DIAGNOSIS — F0392 Unspecified dementia, unspecified severity, with psychotic disturbance: Secondary | ICD-10-CM | POA: Diagnosis not present

## 2022-03-05 DIAGNOSIS — I5032 Chronic diastolic (congestive) heart failure: Secondary | ICD-10-CM | POA: Diagnosis not present

## 2022-03-05 DIAGNOSIS — G20 Parkinson's disease: Secondary | ICD-10-CM

## 2022-03-05 NOTE — Progress Notes (Signed)
Location:  [facility] Room Number: 102-P Place of Service:  SNF (31) Provider: Synthia Innocent, NP    CODE STATUS: DNR  Allergies  Allergen Reactions   Contrast Media [Iodinated Contrast Media] Anaphylaxis and Shortness Of Breath   Vancomycin Shortness Of Breath   Vioxx [Rofecoxib] Other (See Comments)    REACTION: irregular heartbeat    Chief Complaint  Patient presents with   Acute Visit    Care plan meeting.    HPI:  We have come together for her care plan meeting.  BIMS 10/15 mood 0/30. She is nonambulatory with no recent falls. She requires extensive to dependent assist with her adls. She is incontinent of bladder and bowel. She frequently declines her medications. Dietary:  she requires extensive assist to dependent for her meals. 75-100% appetite; weight is 139.6 pounds; regular diet with finger foods. She is being followed by palliative care. Therapy none at this time. Activities: one on one visits. She continue to be followed for her chronic illnesses including:   Chronic diastolic CHF (congestive heart failure)  Dementia with psychosis Parkinson's diease  Past Medical History:  Diagnosis Date   Chronic diastolic CHF (congestive heart failure) (HCC) 08/16/2009   Qualifier: Diagnosis of  By: Excell Seltzer, MD, [place]    History of pulmonary embolism    Parkinson's disease (HCC)    Venous stasis     History reviewed. No pertinent surgical history.  Social History   Socioeconomic History   Marital status: Single    Spouse name: Not on file   Number of children: Not on file   Years of education: Not on file   Highest education level: Not on file  Occupational History   Not on file  Tobacco Use   Smoking status: Never   Smokeless tobacco: Never  Vaping Use   Vaping Use: Never used  Substance and Sexual Activity   Alcohol use: No   Drug use: No   Sexual activity: Not on file  Other Topics Concern   Not on file  Social History Narrative    Not on file   Social Determinants of Health   Financial Resource Strain: Not on file  Food Insecurity: Not on file  Transportation Needs: Not on file  Physical Activity: Not on file  Stress: Not on file  Social Connections: Not on file  Intimate Partner Violence: Not on file   Family History  Problem Relation Age of Onset   Heart disease Mother        90s      VITAL SIGNS BP (!) 140/78   Pulse 72   Temp (!) 97.4 F (36.3 C)   Resp 20   Ht 5\' 2"  (1.575 m)   Wt 142 lb (64.4 kg)   SpO2 97%   BMI 25.97 kg/m   Outpatient Encounter Medications as of 03/05/2022  Medication Sig   acetaminophen (TYLENOL) 325 MG tablet Take 2 tablets (650 mg total) by mouth every 6 (six) hours as needed for mild pain, fever or headache (or Fever >/= 101).   NON FORMULARY Diet: Regular, finger foods as desired   polyethylene glycol (MIRALAX / GLYCOLAX) 17 g packet Take 17 g by mouth 2 (two) times daily as needed.   rivaroxaban (XARELTO) 20 MG TABS tablet Take 1 tablet (20 mg total) by mouth daily with supper.   talc (ZEASORB) powder under right side of neck, Every Shif   [DISCONTINUED] nystatin (MYCOSTATIN/NYSTOP) powder Apply 1 application  topically daily  as needed (Sparingly right, neck).   No facility-administered encounter medications on file as of 03/05/2022.     SIGNIFICANT DIAGNOSTIC EXAMS  LABS REVIEWED PREVIOUS    09-25-21; wbc 4.2; hgb 12.1; hct 37.9; mcv 89.4 plt 155; glucose 63; bun 31; creat 0.59; k+ 3.8; na++ 142; ca 8.7; GFR >60; protein 5.6; albumin 3.0   NO NEW LABS.   Review of Systems  Unable to perform ROS: Dementia   Physical Exam Constitutional:      General: She is not in acute distress.    Appearance: She is well-developed. She is not diaphoretic.  Neck:     Thyroid: No thyromegaly.  Cardiovascular:     Rate and Rhythm: Normal rate and regular rhythm.     Pulses: Normal pulses.     Heart sounds: Normal heart sounds.  Pulmonary:     Effort: Pulmonary  effort is normal. No respiratory distress.     Breath sounds: Normal breath sounds.  Abdominal:     General: Bowel sounds are normal. There is no distension.     Palpations: Abdomen is soft.     Tenderness: There is no abdominal tenderness.  Musculoskeletal:     Cervical back: Neck supple.     Right lower leg: No edema.     Left lower leg: No edema.     Comments: Neck is weak has contracture Is able to move all extremities right extremity with ataxic movements  Trace bilateral lower extremity edema    Lymphadenopathy:     Cervical: No cervical adenopathy.  Skin:    General: Skin is warm and dry.  Neurological:     Mental Status: She is alert. Mental status is at baseline.  Psychiatric:        Mood and Affect: Mood normal.      ASSESSMENT/ PLAN:  TODAY  Chronic diastolic CHF (congestive heart failure) Dementia with psychosis Parkinson's diease  Will continue current medications Will continue current plan of care Will continue to monitor her status.   Time spent with patient: 40 minutes: medications; plan of care; dietary   Synthia Innocent NP [facility]  call [phone]

## 2022-03-25 ENCOUNTER — Encounter: Payer: Self-pay | Admitting: Internal Medicine

## 2022-03-25 ENCOUNTER — Non-Acute Institutional Stay (SKILLED_NURSING_FACILITY): Payer: Medicare Other | Admitting: Internal Medicine

## 2022-03-25 DIAGNOSIS — I5032 Chronic diastolic (congestive) heart failure: Secondary | ICD-10-CM | POA: Diagnosis not present

## 2022-03-25 DIAGNOSIS — G20B1 Parkinson's disease with dyskinesia, without mention of fluctuations: Secondary | ICD-10-CM

## 2022-03-25 DIAGNOSIS — E43 Unspecified severe protein-calorie malnutrition: Secondary | ICD-10-CM | POA: Diagnosis not present

## 2022-03-25 NOTE — Assessment & Plan Note (Addendum)
Staff feeds her meals at the bedside.  She has atrophy and profound interosseous wasting documenting protein/caloric malnutrition.Nutrition monitors.

## 2022-03-25 NOTE — Assessment & Plan Note (Signed)
CHF clinically compensated; no NVD or peripheral edema. No change in present cardiac regimen indicated.  

## 2022-03-25 NOTE — Progress Notes (Unsigned)
   NURSING HOME LOCATION:  [facility] ROOM NUMBER:  102  CODE STATUS:  DNR  PCP:  Ok Edwards NP  This is a nursing facility follow up visit of chronic medical diagnoses & to document compliance with Regulation 483.30 (c) in The [place] Manual Phase 2 which mandates caregiver visit ( visits can alternate among physician, PA or NP as per statutes) within 10 days of 30 days / 60 days/ 90 days post admission to SNF date    Interim medical record and care since last SNF visit was updated with review of diagnostic studies and change in clinical status since last visit were documented.  HPI: She is a permanent resident of this facility with history of chronic diastolic congestive heart failure, history of PTE, and Parkinson's complicated by neurocognitive and behavioral issues.  Most current labs reveal protein/caloric malnutrition with an albumin of 3 and total protein of 5.6.  CBC has been normal.  Review of systems: Dementia invalidated responses.  She was essentially nonverbal throughout the interview preventing completion of review of systems.  She stated that she was "okay."  For the most part there was no answer when I asked her questions.  Her sister had told me that she likes hotdogs from a [employer] and when I mentioned this; she stated "no onions."  Physical exam:  Pertinent or positive findings: There is profound torticollis with the neck flexed to the right with the right cheek on the right shoulder.  When she opens her eyes they are deviated to the right.  Breath sounds & heart sounds are markedly distant.  Abdomen is protuberant.  Pedal pulses are nonpalpable.  She has hyperpigmented, keratotic skin changes bilaterally.  She exhibits a coarse tremor of the hands.  There is a fine tremor of the left foot.  She has severe interosseous wasting the hands.  General appearance: no acute distress, increased work of breathing is present.   Lymphatic:  No lymphadenopathy about the head, neck, axilla. Eyes: No conjunctival inflammation or lid edema is present. There is no scleral icterus. Ears:  External ear exam shows no significant lesions or deformities.   Nose:  External nasal examination shows no deformity or inflammation. Nasal mucosa are pink and moist without lesions, exudates Oral exam:  Lips and gums are healthy appearing. There is no oropharyngeal erythema or exudate. Neck:  No thyromegaly, masses, tenderness noted.    Heart:  No gallop, murmur, click, rub .  Lungs: without wheezes, rhonchi, rales, rubs. Abdomen: Bowel sounds are normal. Abdomen is soft and nontender with no organomegaly, hernias, masses. GU: Deferred  Extremities:  No cyanosis, clubbing, edema  Neurologic exam :Balance, Rhomberg, finger to nose testing could not be completed due to clinical state Skin: Warm & dry w/o tenting.  See summary under each active problem in the Problem List with associated updated therapeutic plan

## 2022-03-25 NOTE — Assessment & Plan Note (Signed)
Parkinson's is complicated by neurocognitive deficits and severe neuromuscular impairment.  Clinically this appears fairly stable.  She has had no treatment or neurologic follow-up for years due to noncompliance.

## 2022-03-25 NOTE — Patient Instructions (Signed)
See assessment and plan under each diagnosis in the problem list and acutely for this visit 

## 2022-04-27 ENCOUNTER — Encounter: Payer: Self-pay | Admitting: Adult Health

## 2022-04-27 ENCOUNTER — Non-Acute Institutional Stay (SKILLED_NURSING_FACILITY): Payer: Medicare Other | Admitting: Adult Health

## 2022-04-27 DIAGNOSIS — I5032 Chronic diastolic (congestive) heart failure: Secondary | ICD-10-CM | POA: Diagnosis not present

## 2022-04-27 DIAGNOSIS — Z86711 Personal history of pulmonary embolism: Secondary | ICD-10-CM

## 2022-04-27 DIAGNOSIS — G20B1 Parkinson's disease with dyskinesia, without mention of fluctuations: Secondary | ICD-10-CM

## 2022-04-27 NOTE — Progress Notes (Unsigned)
Location:  [facility] Room Number: 102 Place of Service:   SNF    CODE STATUS: dnr   Allergies  Allergen Reactions   Contrast Media [Iodinated Contrast Media] Anaphylaxis and Shortness Of Breath   Vancomycin Shortness Of Breath   Vioxx [Rofecoxib] Other (See Comments)    REACTION: irregular heartbeat    Chief Complaint  Patient presents with   Medical Management of Chronic Issues                            Chronic pulmonary embolism without acute cor pulmonale unspecified pulmonary embolism type:  Parkinson's disease:Chronic diastolic CHF (congestive heart failure)    HPI:  She is an 87 year old long term resident of this facility being seen for the management of her chronic illnesses:   Chronic pulmonary embolism without acute cor pulmonale unspecified pulmonary embolism type:  Parkinson's disease:Chronic diastolic CHF (congestive heart failure). There are no reports of uncontrolled pain; her weight is stable; she spends all of her time in bed per her choice.   Past Medical History:  Diagnosis Date   Chronic diastolic CHF (congestive heart failure) (HCC) 08/16/2009   Qualifier: Diagnosis of  By: Excell Seltzer, MD, [place]    History of pulmonary embolism    Parkinson's disease    Venous stasis     No past surgical history on file.  Social History   Socioeconomic History   Marital status: Single    Spouse name: Not on file   Number of children: Not on file   Years of education: Not on file   Highest education level: Not on file  Occupational History   Not on file  Tobacco Use   Smoking status: Never   Smokeless tobacco: Never  Vaping Use   Vaping Use: Never used  Substance and Sexual Activity   Alcohol use: No   Drug use: No   Sexual activity: Not on file  Other Topics Concern   Not on file  Social History Narrative   Not on file   Social Determinants of Health   Financial Resource Strain: Not on file  Food Insecurity: Not on  file  Transportation Needs: Not on file  Physical Activity: Not on file  Stress: Not on file  Social Connections: Not on file  Intimate Partner Violence: Not on file   Family History  Problem Relation Age of Onset   Heart disease Mother        20s      VITAL SIGNS BP (!) 168/75   Pulse 63   Temp 97.6 F (36.4 C)   Resp 18   Ht 5\' 2"  (1.575 m)   Wt 137 lb (62.1 kg)   SpO2 97%   BMI 25.06 kg/m   Outpatient Encounter Medications as of 04/27/2022  Medication Sig   acetaminophen (TYLENOL) 325 MG tablet Take 2 tablets (650 mg total) by mouth every 6 (six) hours as needed for mild pain, fever or headache (or Fever >/= 101).   NON FORMULARY Diet: Regular, finger foods as desired   polyethylene glycol (MIRALAX / GLYCOLAX) 17 g packet Take 17 g by mouth 2 (two) times daily as needed.   rivaroxaban (XARELTO) 20 MG TABS tablet Take 1 tablet (20 mg total) by mouth daily with supper.   talc (ZEASORB) powder under right side of neck, Every Shif   No facility-administered encounter medications on file as of 04/27/2022.  SIGNIFICANT DIAGNOSTIC EXAMS  LABS REVIEWED PREVIOUS    09-25-21; wbc 4.2; hgb 12.1; hct 37.9; mcv 89.4 plt 155; glucose 63; bun 31; creat 0.59; k+ 3.8; na++ 142; ca 8.7; GFR >60; protein 5.6; albumin 3.0   NO NEW LABS.   Review of Systems  Unable to perform ROS: Dementia    Physical Exam Constitutional:      General: She is not in acute distress.    Appearance: She is well-developed. She is not diaphoretic.  Neck:     Thyroid: No thyromegaly.  Cardiovascular:     Rate and Rhythm: Normal rate and regular rhythm.     Pulses: Normal pulses.     Heart sounds: Normal heart sounds.  Pulmonary:     Effort: Pulmonary effort is normal. No respiratory distress.     Breath sounds: Normal breath sounds.  Abdominal:     General: Bowel sounds are normal. There is no distension.     Palpations: Abdomen is soft.     Tenderness: There is no abdominal tenderness.   Musculoskeletal:     Cervical back: Neck supple.     Right lower leg: Edema present.     Left lower leg: Edema present.     Comments: Neck is weak has contracture Is able to move all extremities right extremity with ataxic movements  Trace bilateral lower extremity edema     Lymphadenopathy:     Cervical: No cervical adenopathy.  Skin:    General: Skin is warm and dry.  Neurological:     Mental Status: She is alert. Mental status is at baseline.  Psychiatric:        Mood and Affect: Mood normal.       ASSESSMENT/ PLAN:  TODAY;   Chronic pulmonary embolism without acute cor pulmonale unspecified pulmonary embolism type: will continue xarelto 20 mg daily; she will frequently decline this medications  2. Parkinson's disease: no significant change; she has declined any further workup   3. Chronic diastolic CHF (congestive heart failure) is compensated  PREVIOUS   4. Venous (peripheral) insufficiency: without change in status.   5. Protein calorie malnutrition albumin is 3.0; weight is 139 pounds.    6. Psychosis in elderly with behavioral disturbance: she will not take medications without recent behavioral disturbance  7. Dementia with psychosis weight is 137 pounds  8. Failure to thrive in adult: weight is 137 pounds is without change    Will check cbc; cmp   Ok Edwards NP [facility]   call [phone]

## 2022-05-04 ENCOUNTER — Other Ambulatory Visit (HOSPITAL_COMMUNITY)
Admission: RE | Admit: 2022-05-04 | Discharge: 2022-05-04 | Disposition: A | Discharge status: Home / Self Care | Payer: Medicare Other | Source: Skilled Nursing Facility | Attending: Adult Health | Admitting: Adult Health

## 2022-05-04 DIAGNOSIS — N182 Chronic kidney disease, stage 2 (mild): Secondary | ICD-10-CM | POA: Insufficient documentation

## 2022-05-04 LAB — CBC
HCT: 37.9 % (ref 36.0–46.0)
Hemoglobin: 12.6 g/dL (ref 12.0–15.0)
MCH: 29.3 pg (ref 26.0–34.0)
MCHC: 33.2 g/dL (ref 30.0–36.0)
MCV: 88.1 fL (ref 80.0–100.0)
Platelets: 233 10*3/uL (ref 150–400)
RBC: 4.3 MIL/uL (ref 3.87–5.11)
RDW: 14.3 % (ref 11.5–15.5)
WBC: 6.5 10*3/uL (ref 4.0–10.5)
nRBC: 0 % (ref 0.0–0.2)

## 2022-05-04 LAB — COMPREHENSIVE METABOLIC PANEL
ALT: 16 U/L (ref 0–44)
AST: 19 U/L (ref 15–41)
Albumin: 3 g/dL — ABNORMAL LOW (ref 3.5–5.0)
Alkaline Phosphatase: 63 U/L (ref 38–126)
Anion gap: 5 (ref 5–15)
BUN: 24 mg/dL — ABNORMAL HIGH (ref 8–23)
CO2: 25 mmol/L (ref 22–32)
Calcium: 8.5 mg/dL — ABNORMAL LOW (ref 8.9–10.3)
Chloride: 111 mmol/L (ref 98–111)
Creatinine, Ser: 0.58 mg/dL (ref 0.44–1.00)
GFR, Estimated: >60 mL/min (ref >60)
Glucose, Bld: 82 mg/dL (ref 70–99)
Potassium: 4 mmol/L (ref 3.5–5.1)
Sodium: 141 mmol/L (ref 135–145)
Total Bilirubin: 0.6 mg/dL (ref 0.3–1.2)
Total Protein: 5.7 g/dL — ABNORMAL LOW (ref 6.5–8.1)

## 2022-05-26 ENCOUNTER — Non-Acute Institutional Stay (SKILLED_NURSING_FACILITY): Payer: Medicare Other | Admitting: Adult Health

## 2022-05-26 ENCOUNTER — Encounter: Payer: Self-pay | Admitting: Adult Health

## 2022-05-26 DIAGNOSIS — E43 Unspecified severe protein-calorie malnutrition: Secondary | ICD-10-CM | POA: Diagnosis not present

## 2022-05-26 DIAGNOSIS — F03918 Unspecified dementia, unspecified severity, with other behavioral disturbance: Secondary | ICD-10-CM | POA: Diagnosis not present

## 2022-05-26 DIAGNOSIS — I872 Venous insufficiency (chronic) (peripheral): Secondary | ICD-10-CM | POA: Diagnosis not present

## 2022-05-26 NOTE — Progress Notes (Signed)
Location:  [facility] Room Number: NO/102/P Place of Service:  SNF (31) Arnett Galindez S.,NP CODE STATUS: DNR  Allergies  Allergen Reactions   Contrast Media [Iodinated Contrast Media] Anaphylaxis and Shortness Of Breath   Vancomycin Shortness Of Breath   Vioxx [Rofecoxib] Other (See Comments)    REACTION: irregular heartbeat   Chief Complaint  Patient presents with   Medical Management of Chronic Issues                    Venous (peripheral) insufficiency: Protein calorie malnutrition albumin Psychosis in elderly with behavioral disturbance    HPI:  She is a 87 year old long term resident of this facility being seen for the management of her chronic illnesses:  Venous (peripheral) insufficiency: Protein calorie malnutrition albumin Psychosis in elderly with behavioral disturbance. She spends all of her time in bed per her choice. There are no reports of uncontrolled pain. She has lost 9 pounds down to 130 pounds.   Past Medical History:  Diagnosis Date   Chronic diastolic CHF (congestive heart failure) (HCC) 08/16/2009   Qualifier: Diagnosis of  By: Excell Seltzer, MD, [place]    History of pulmonary embolism    Parkinson's disease    Venous stasis     History reviewed. No pertinent surgical history.  Social History   Socioeconomic History   Marital status: Single    Spouse name: Not on file   Number of children: Not on file   Years of education: Not on file   Highest education level: Not on file  Occupational History   Not on file  Tobacco Use   Smoking status: Never   Smokeless tobacco: Never  Vaping Use   Vaping Use: Never used  Substance and Sexual Activity   Alcohol use: No   Drug use: No   Sexual activity: Not on file  Other Topics Concern   Not on file  Social History Narrative   Not on file   Social Determinants of Health   Financial Resource Strain: Not on file  Food Insecurity: Not on file  Transportation Needs: Not on  file  Physical Activity: Not on file  Stress: Not on file  Social Connections: Not on file  Intimate Partner Violence: Not on file   Family History  Problem Relation Age of Onset   Heart disease Mother        69s      VITAL SIGNS BP (!) 158/52   Pulse 90   Temp (!) 97 F (36.1 C)   Resp 20   Ht 5\' 2"  (1.575 m)   Wt 130 lb 3.2 oz (59.1 kg)   SpO2 94%   BMI 23.81 kg/m   Outpatient Encounter Medications as of 05/26/2022  Medication Sig   NON FORMULARY Diet: Regular, finger foods as desired   rivaroxaban (XARELTO) 20 MG TABS tablet Take 1 tablet (20 mg total) by mouth daily with supper.   talc (ZEASORB) powder under right side of neck, Every Shif   [DISCONTINUED] acetaminophen (TYLENOL) 325 MG tablet Take 2 tablets (650 mg total) by mouth every 6 (six) hours as needed for mild pain, fever or headache (or Fever >/= 101).   [DISCONTINUED] polyethylene glycol (MIRALAX / GLYCOLAX) 17 g packet Take 17 g by mouth 2 (two) times daily as needed.   No facility-administered encounter medications on file as of 05/26/2022.     SIGNIFICANT DIAGNOSTIC EXAMS  LABS REVIEWED PREVIOUS    09-25-21; wbc 4.2;  hgb 12.1; hct 37.9; mcv 89.4 plt 155; glucose 63; bun 31; creat 0.59; k+ 3.8; na++ 142; ca 8.7; GFR >60; protein 5.6; albumin 3.0   TODAY  05-04-22 wbc 6.5 hgb 12.6; hct 37.9; mcv 88.1 plt 233; glucose 82; bun 24; creat 0.58; k+ 4.0; na++ 141; ca 8.5; gfr >60; protein 5.7; albumin 3.0    Review of Systems  Unable to perform ROS: Dementia   Physical Exam Constitutional:      General: She is not in acute distress.    Appearance: She is well-developed. She is not diaphoretic.  Neck:     Thyroid: No thyromegaly.  Cardiovascular:     Rate and Rhythm: Normal rate and regular rhythm.     Pulses: Normal pulses.     Heart sounds: Normal heart sounds.  Pulmonary:     Effort: Pulmonary effort is normal. No respiratory distress.     Breath sounds: Normal breath sounds.  Abdominal:      General: Bowel sounds are normal. There is no distension.     Palpations: Abdomen is soft.     Tenderness: There is no abdominal tenderness.  Musculoskeletal:     Cervical back: Neck supple.     Right lower leg: No edema.     Left lower leg: No edema.     Comments: Neck is weak has contracture Is able to move all extremities right extremity with ataxic movements  Trace bilateral lower extremity edema      Lymphadenopathy:     Cervical: No cervical adenopathy.  Skin:    General: Skin is warm and dry.  Neurological:     Mental Status: She is alert. Mental status is at baseline.  Psychiatric:        Mood and Affect: Mood normal.     ASSESSMENT/ PLAN:  TODAY;   Venous (peripheral) insufficiency: without change in status  2. Protein calorie malnutrition albumin 3.0 weight is 130 pounds has lost 9 pounds.   3. Psychosis in elderly with behavioral disturbance: will not take medications; no recent reports of behavioral issues.   PREVIOUS   4. Dementia with psychosis weight is 130 pounds  5. Failure to thrive in adult: weight is 130 pounds is without change   6. Chronic pulmonary embolism without acute cor pulmonale unspecified pulmonary embolism type: will continue xarelto 20 mg daily; she will frequently decline this medications  7. Parkinson's disease: no significant change; she has declined any further workup   8. Chronic diastolic CHF (congestive heart failure) is compensated    Synthia Innocent NP [facility]  call [phone]

## 2022-06-05 ENCOUNTER — Encounter: Payer: Self-pay | Admitting: Adult Health

## 2022-06-05 ENCOUNTER — Non-Acute Institutional Stay (SKILLED_NURSING_FACILITY): Payer: Medicare Other | Admitting: Adult Health

## 2022-06-05 DIAGNOSIS — I5032 Chronic diastolic (congestive) heart failure: Secondary | ICD-10-CM

## 2022-06-05 DIAGNOSIS — G20B1 Parkinson's disease with dyskinesia, without mention of fluctuations: Secondary | ICD-10-CM

## 2022-06-05 DIAGNOSIS — F0392 Unspecified dementia, unspecified severity, with psychotic disturbance: Secondary | ICD-10-CM

## 2022-06-05 NOTE — Progress Notes (Signed)
Location:  [facility] Room Number: NO/102/P Place of Service:  SNF (31) Britian Jentz S.,NP  CODE STATUS: DNR  Allergies  Allergen Reactions   Contrast Media [Iodinated Contrast Media] Anaphylaxis and Shortness Of Breath   Vancomycin Shortness Of Breath   Vioxx [Rofecoxib] Other (See Comments)    REACTION: irregular heartbeat    Chief Complaint  Patient presents with   Acute Visit    Patient is being seen for care plan management    HPI:  We have come together for her care plan meeting. BIMS 6/15 mood 2/30: not sleeping well. Spends all of her time in bed; no falls. She is dependent care for her adls. She is incontinent of bladder and bowel. Dietary: weight is 130 pounds regular diet with finger food appetite 75-100% is able to feed self at times. Therapy: none at this time. Activities: does not attend. She continues to be followed for her chronic illnesses including: Chronic diastolic congestive heart failure  Parkinson's disease with dyskinesia without fluctuating manifestations   Dementia with psychosis  Past Medical History:  Diagnosis Date   Chronic diastolic CHF (congestive heart failure) (HCC) 08/16/2009   Qualifier: Diagnosis of  By: Excell Seltzer, MD, [place]    History of pulmonary embolism    Parkinson's disease    Venous stasis     History reviewed. No pertinent surgical history.  Social History   Socioeconomic History   Marital status: Single    Spouse name: Not on file   Number of children: Not on file   Years of education: Not on file   Highest education level: Not on file  Occupational History   Not on file  Tobacco Use   Smoking status: Never   Smokeless tobacco: Never  Vaping Use   Vaping Use: Never used  Substance and Sexual Activity   Alcohol use: No   Drug use: No   Sexual activity: Not on file  Other Topics Concern   Not on file  Social History Narrative   Not on file   Social Determinants of Health   Financial  Resource Strain: Not on file  Food Insecurity: Not on file  Transportation Needs: Not on file  Physical Activity: Not on file  Stress: Not on file  Social Connections: Not on file  Intimate Partner Violence: Not on file   Family History  Problem Relation Age of Onset   Heart disease Mother        101s      VITAL SIGNS BP 125/86   Pulse 72   Temp (!) 97.2 F (36.2 C)   Resp (!) 22   Ht 5\' 2"  (1.575 m)   Wt 132 lb (59.9 kg)   SpO2 98%   BMI 24.14 kg/m   Outpatient Encounter Medications as of 06/05/2022  Medication Sig   NON FORMULARY Diet: Regular, finger foods as desired   rivaroxaban (XARELTO) 20 MG TABS tablet Take 1 tablet (20 mg total) by mouth daily with supper.   talc (ZEASORB) powder under right side of neck, Every Shif   No facility-administered encounter medications on file as of 06/05/2022.     SIGNIFICANT DIAGNOSTIC EXAMS  LABS REVIEWED PREVIOUS    09-25-21; wbc 4.2; hgb 12.1; hct 37.9; mcv 89.4 plt 155; glucose 63; bun 31; creat 0.59; k+ 3.8; na++ 142; ca 8.7; GFR >60; protein 5.6; albumin 3.0  05-04-22 wbc 6.5 hgb 12.6; hct 37.9; mcv 88.1 plt 233; glucose 82; bun 24; creat 0.58; k+ 4.0;  na++ 141; ca 8.5; gfr >60; protein 5.7; albumin 3.0  NO NEW LABS.    Review of Systems  Unable to perform ROS: Dementia     Physical Exam Constitutional:      General: She is not in acute distress.    Appearance: She is well-developed. She is not diaphoretic.  Neck:     Thyroid: No thyromegaly.  Cardiovascular:     Rate and Rhythm: Normal rate and regular rhythm.     Pulses: Normal pulses.     Heart sounds: Normal heart sounds.  Pulmonary:     Effort: Pulmonary effort is normal. No respiratory distress.     Breath sounds: Normal breath sounds.  Abdominal:     General: Bowel sounds are normal. There is no distension.     Palpations: Abdomen is soft.     Tenderness: There is no abdominal tenderness.  Musculoskeletal:     Cervical back: Neck supple.      Right lower leg: Edema present.     Left lower leg: Edema present.     Comments: Neck is weak has contracture Is able to move all extremities right extremity with ataxic movements  Trace bilateral lower extremity edema     Lymphadenopathy:     Cervical: No cervical adenopathy.  Skin:    General: Skin is warm and dry.  Neurological:     Mental Status: She is alert. Mental status is at baseline.  Psychiatric:        Mood and Affect: Mood normal.       ASSESSMENT/ PLAN:  TODAY  Chronic diastolic congestive heart failure Parkinson's disease with dyskinesia without fluctuating manifestations Dementia with psychosis  Will continue current medications Will continue current plan of care Will continue to monitor her status   Time spent with patient: 40 minutes: medications; declining medications; dietary plan of care.    Ok Edwards NP [facility]  call [phone]

## 2022-06-23 ENCOUNTER — Encounter: Payer: Self-pay | Admitting: Internal Medicine

## 2022-06-23 ENCOUNTER — Non-Acute Institutional Stay (SKILLED_NURSING_FACILITY): Payer: Medicare Other | Admitting: Internal Medicine

## 2022-06-23 DIAGNOSIS — E43 Unspecified severe protein-calorie malnutrition: Secondary | ICD-10-CM | POA: Diagnosis not present

## 2022-06-23 DIAGNOSIS — J00 Acute nasopharyngitis [common cold]: Secondary | ICD-10-CM

## 2022-06-23 DIAGNOSIS — H90A32 Mixed conductive and sensorineural hearing loss, unilateral, left ear with restricted hearing on the contralateral side: Secondary | ICD-10-CM

## 2022-06-23 DIAGNOSIS — I5032 Chronic diastolic (congestive) heart failure: Secondary | ICD-10-CM

## 2022-06-23 DIAGNOSIS — G20B1 Parkinson's disease with dyskinesia, without mention of fluctuations: Secondary | ICD-10-CM | POA: Diagnosis not present

## 2022-06-23 DIAGNOSIS — R233 Spontaneous ecchymoses: Secondary | ICD-10-CM

## 2022-06-23 DIAGNOSIS — N182 Chronic kidney disease, stage 2 (mild): Secondary | ICD-10-CM

## 2022-06-23 NOTE — Assessment & Plan Note (Signed)
CHF clinically compensated; no NVD or peripheral edema. No indication for medication initiation.

## 2022-06-23 NOTE — Assessment & Plan Note (Addendum)
Because of her protein/caloric malnutrition with decreased muscle mass; the veracity of CKD stage II is in question.  She is on no nephrotoxic drugs.  Urine protein creatinine ratio has not been checked since 2016.  There is no urine albumin on record.

## 2022-06-23 NOTE — Assessment & Plan Note (Signed)
Because of her dementia and psychosis associated with Parkinson's; she has been noncompliant with neurologic follow-up.

## 2022-06-23 NOTE — Assessment & Plan Note (Signed)
05/04/2022 total protein 5.7 and albumin 3.0, essentially stable.  P.O.. intake is markedly variable.  Nutrition continues to follow.

## 2022-06-23 NOTE — Patient Instructions (Signed)
See assessment and plan under each diagnosis in the problem list and acutely for this visit 

## 2022-06-23 NOTE — Progress Notes (Unsigned)
NURSING HOME LOCATION:  [facility] ROOM NUMBER:  102  CODE STATUS:  DNR  PCP:  Ok Edwards NP  This is a nursing facility follow up visit of chronic medical diagnoses & to document compliance with Regulation 483.30 (c) in The [place] Manual Phase 2 which mandates caregiver visit ( visits can alternate among physician, PA or NP as per statutes) within 10 days of 30 days / 60 days/ 90 days post admission to SNF date    Interim medical record and care since last SNF visit was updated with review of diagnostic studies and change in clinical status since last visit were documented.  HPI: She is a permanent resident of this facility with medical diagnoses of Parkinson's disease with neurovascular complications, history of PTE, and history of chronic diastolic congestive heart failure, venous insufficiency, history of B12 deficiency, and dementia with psychoses.  This is resulted in noncompliance with recommendations as to treatment and follow-up of her advanced Parkinson's. Most recent labs were performed 05/04/2022 and revealed minimal prerenal azotemia with a BUN of 24 and hypocalcemia with a value of 8.5.  Total protein was 5.7 and albumin 3.0 compatible with protein/caloric malnutrition.  Creatinine was 0.58 with a GFR greater than 60.  CBC was normal.  Review of systems: The patient is essentially nonverbal but her sister has several concerns.  Her chief concern is a linear bruise over the left lateral thorax which apparently is appeared in the last 72 hours.  She is also worried about the patient's complaint of pain in the heels.  Wound care nurse reports that the patient will not wear the protective booties and a special pillow has been ordered to relieve pressure on the heels.  Her sister is also concerned about decreased auditory acuity in the left ear and question the need for wax removal.  Her sister states that the patient eats well when she is fed slowly.   Her sister questions whether she is a candidate for attempts at mobilization and also a massage.  Final concern of her sister is apparently rhinitis without other extrinsic symptoms or signs of purulence.   Pertinent or positive findings: She appears chronically ill with advanced chronic insomnia and stigmata.  There is marked deviation of the head and neck to the right with the right face essentially sitting on the shoulder.  Nares reveal no significant inflammation.  She is missing multiple maxillary teeth.  Breath sounds heart sounds are distant.  Abdomen is protuberant.  Pedal pulses are nonpalpable.  There is no edema despite the history of heart failure.  She appears to have bilateral foot drop.  The hands are are clenched although the right index and third finger are extended.  There is a coarse tremor of the hands.  There is marked increased rigidity with no spontaneous range of motion.  Irregular linear dense ecchymosis pattern over the left lateral thorax.  There is no tenderness to palpation in this area and no crepitus is auscultated well.  A tiny eschar is noted at the left elbow.  There is marked splotchy erythema over the left cheek which blanches somewhat to pressure.  She has irregular hyperpigmentation over the shins which does not appear to blanch.  There is marked interosseous wasting of the hands. General appearance: Adequately nourished; no acute distress, increased work of breathing is present.   Lymphatic: No lymphadenopathy about the head, neck, axilla. Eyes: No conjunctival inflammation or lid edema is present. There is  no scleral icterus. Ears:  External ear exam shows no significant lesions or deformities.   Nose:  External nasal examination shows no deformity or inflammation. Nasal mucosa are pink and moist without lesions, exudates Oral exam:  Lips and gums are healthy appearing. There is no oropharyngeal erythema or exudate. Neck:  No thyromegaly, masses, tenderness noted.     Heart:  Normal rate and regular rhythm. S1 and S2 normal without gallop, murmur, click, rub .  Lungs: Chest clear to auscultation without wheezes, rhonchi, rales, rubs. Abdomen: Bowel sounds are normal. Abdomen is soft and nontender with no organomegaly, hernias, masses. GU: Deferred  Extremities:  No cyanosis, clubbing, edema  Neurologic exam : Cn 2-7 intact Strength equal  in upper & lower extremities Balance, Rhomberg, finger to nose testing could not be completed due to clinical state Deep tendon reflexes are equal Skin: Warm & dry w/o tenting. No significant lesions or rash.  See summary under each active problem in the Problem List with associated updated therapeutic plan

## 2022-06-25 ENCOUNTER — Other Ambulatory Visit (HOSPITAL_COMMUNITY)
Admission: RE | Admit: 2022-06-25 | Discharge: 2022-06-25 | Disposition: A | Discharge status: Home / Self Care | Payer: Medicare Other | Source: Skilled Nursing Facility | Attending: Adult Health | Admitting: Adult Health

## 2022-06-25 DIAGNOSIS — N182 Chronic kidney disease, stage 2 (mild): Secondary | ICD-10-CM | POA: Diagnosis present

## 2022-06-25 LAB — CBC
HCT: 38.4 % (ref 36.0–46.0)
Hemoglobin: 12.3 g/dL (ref 12.0–15.0)
MCH: 28.9 pg (ref 26.0–34.0)
MCHC: 32 g/dL (ref 30.0–36.0)
MCV: 90.1 fL (ref 80.0–100.0)
Platelets: 196 10*3/uL (ref 150–400)
RBC: 4.26 MIL/uL (ref 3.87–5.11)
RDW: 14.7 % (ref 11.5–15.5)
WBC: 5.2 10*3/uL (ref 4.0–10.5)
nRBC: 0 % (ref 0.0–0.2)

## 2022-07-23 ENCOUNTER — Non-Acute Institutional Stay (SKILLED_NURSING_FACILITY): Payer: Medicare Other | Admitting: Adult Health

## 2022-07-23 ENCOUNTER — Encounter: Payer: Self-pay | Admitting: Adult Health

## 2022-07-23 DIAGNOSIS — F0392 Unspecified dementia, unspecified severity, with psychotic disturbance: Secondary | ICD-10-CM

## 2022-07-23 DIAGNOSIS — Z86711 Personal history of pulmonary embolism: Secondary | ICD-10-CM | POA: Diagnosis not present

## 2022-07-23 DIAGNOSIS — R627 Adult failure to thrive: Secondary | ICD-10-CM

## 2022-07-23 NOTE — Progress Notes (Signed)
Location:  [place] Room Number: 102-P Place of Service:  SNF (31)   CODE STATUS: DNR  Allergies  Allergen Reactions   Contrast Media [Iodinated Contrast Media] Anaphylaxis and Shortness Of Breath   Vancomycin Shortness Of Breath   Vioxx [Rofecoxib] Other (See Comments)    REACTION: irregular heartbeat    Chief Complaint  Patient presents with   Medical Management of Chronic Issues                            Dementia with psychosis: Failure to thrive in adult  Chronic pulmonary embolism without acute cor pulmonale unspecified pulmonary embolism type:    HPI:  She is a 88 year old long term resident of this facility being seen for the management of her chronic illnesses:  Dementia with psychosis: Failure to thrive in adult  Chronic pulmonary embolism without acute cor pulmonale unspecified pulmonary embolism type. She does spend all of her time in bed. She is declining any medications to help with her cough. She frequently declines her medication.   Past Medical History:  Diagnosis Date   Chronic diastolic CHF (congestive heart failure) ([place]) 08/16/2009   Qualifier: Diagnosis of  By: Burt Knack, MD, Clayburn Pert    History of pulmonary embolism    Parkinson's disease    Venous stasis     History reviewed. No pertinent surgical history.  Social History   Socioeconomic History   Marital status: Single    Spouse name: Not on file   Number of children: Not on file   Years of education: Not on file   Highest education level: Not on file  Occupational History   Not on file  Tobacco Use   Smoking status: Never   Smokeless tobacco: Never  Vaping Use   Vaping Use: Never used  Substance and Sexual Activity   Alcohol use: No   Drug use: No   Sexual activity: Not on file  Other Topics Concern   Not on file  Social History Narrative   Not on file   Social Determinants of Health   Financial Resource Strain: Not on file  Food Insecurity: Not on  file  Transportation Needs: Not on file  Physical Activity: Not on file  Stress: Not on file  Social Connections: Not on file  Intimate Partner Violence: Not on file   Family History  Problem Relation Age of Onset   Heart disease Mother        58s      VITAL SIGNS BP 122/77   Pulse 77   Temp 98.2 F (36.8 C)   Resp (!) 22   Ht 5\' 2"  (1.575 m)   Wt 133 lb 1.6 oz (60.4 kg)   SpO2 92%   BMI 24.34 kg/m   Outpatient Encounter Medications as of 07/23/2022  Medication Sig   fluticasone (FLONASE ALLERGY RELIEF) 50 MCG/ACT nasal spray Place 1 spray into both nostrils 2 (two) times daily as needed.   NON FORMULARY Diet: Regular, finger foods as desired   rivaroxaban (XARELTO) 20 MG TABS tablet Take 1 tablet (20 mg total) by mouth daily with supper.   talc (ZEASORB) powder under right side of neck, Every Shif   ZINC OXIDE EX Apply topically. bilateral buttocks & sacrum q shift & prn for prevention.   No facility-administered encounter medications on file as of 07/23/2022.     SIGNIFICANT DIAGNOSTIC EXAMS   LABS REVIEWED PREVIOUS  09-25-21; wbc 4.2; hgb 12.1; hct 37.9; mcv 89.4 plt 155; glucose 63; bun 31; creat 0.59; k+ 3.8; na++ 142; ca 8.7; GFR >60; protein 5.6; albumin 3.0  05-04-22 wbc 6.5 hgb 12.6; hct 37.9; mcv 88.1 plt 233; glucose 82; bun 24; creat 0.58; k+ 4.0; na++ 141; ca 8.5; gfr >60; protein 5.7; albumin 3.0   NO NEW LABS.    Review of Systems  Unable to perform ROS: Dementia   Physical Exam Constitutional:      General: She is not in acute distress.    Appearance: She is well-developed. She is not diaphoretic.  Neck:     Thyroid: No thyromegaly.  Cardiovascular:     Rate and Rhythm: Normal rate and regular rhythm.     Pulses: Normal pulses.     Heart sounds: Normal heart sounds.  Pulmonary:     Effort: Pulmonary effort is normal. No respiratory distress.     Breath sounds: Normal breath sounds.  Abdominal:     General: Bowel sounds are normal. There is  no distension.     Palpations: Abdomen is soft.     Tenderness: There is no abdominal tenderness.  Musculoskeletal:     Cervical back: Neck supple.     Right lower leg: Edema present.     Left lower leg: Edema present.     Comments: Neck is weak has contracture Is able to move all extremities right extremity with ataxic movements  Trace bilateral lower extremity edema    Lymphadenopathy:     Cervical: No cervical adenopathy.  Skin:    General: Skin is warm and dry.  Neurological:     Mental Status: She is alert. Mental status is at baseline.  Psychiatric:        Mood and Affect: Mood normal.        ASSESSMENT/ PLAN:  TODAY;   Dementia with psychosis: weight is 133 pounds  2. Failure to thrive in adult  3. Chronic pulmonary embolism without acute cor pulmonale unspecified pulmonary embolism type: is on long term xareltos 20 mg daily she does decline this medications on a regular basis.    PREVIOUS   4. Parkinson's disease: no significant change; she has declined any further workup   5. Chronic diastolic CHF (congestive heart failure) is compensated  6. Venous (peripheral) insufficiency: without change in status  7. Protein calorie malnutrition albumin 3.0 weight is 133 pounds weight is stable   8. Psychosis in elderly with behavioral disturbance: will not take medications; no recent reports of behavioral issues.       Tara Edwards NP [facility]   call [phone]

## 2022-08-21 ENCOUNTER — Non-Acute Institutional Stay (SKILLED_NURSING_FACILITY): Payer: Medicare Other | Admitting: Adult Health

## 2022-08-21 ENCOUNTER — Encounter: Payer: Self-pay | Admitting: Adult Health

## 2022-08-21 DIAGNOSIS — I872 Venous insufficiency (chronic) (peripheral): Secondary | ICD-10-CM

## 2022-08-21 DIAGNOSIS — G20B1 Parkinson's disease with dyskinesia, without mention of fluctuations: Secondary | ICD-10-CM

## 2022-08-21 DIAGNOSIS — I5032 Chronic diastolic (congestive) heart failure: Secondary | ICD-10-CM | POA: Diagnosis not present

## 2022-08-21 NOTE — Progress Notes (Signed)
Location:  [place] Room Number: 102 P Place of Service:  SNF (31)   CODE STATUS: DNR  Allergies  Allergen Reactions   Contrast Media [Iodinated Contrast Media] Anaphylaxis and Shortness Of Breath   Vancomycin Shortness Of Breath   Vioxx [Rofecoxib] Other (See Comments)    REACTION: irregular heartbeat    Chief Complaint  Patient presents with   Medical Management of Chronic Issues                   Parkinson's disease:  Chronic diastolic CHF (congestive heart failure)  Venous (peripheral) insufficiency     HPI:  She is a 88 year old long term resident of this facility being seen for the management of her chronic illnesses: Parkinson's disease:  Chronic diastolic CHF (congestive heart failure)  Venous (peripheral) insufficiency. There are no reports of uncontrolled pain. She has lost weight this past month: her current weight is 128 pounds. She does spend nearly all of her time in bed   Past Medical History:  Diagnosis Date   Chronic diastolic CHF (congestive heart failure) ([place]) 08/16/2009   Qualifier: Diagnosis of  By: Burt Knack, MD, Clayburn Pert    History of pulmonary embolism    Parkinson's disease    Venous stasis     History reviewed. No pertinent surgical history.  Social History   Socioeconomic History   Marital status: Single    Spouse name: Not on file   Number of children: Not on file   Years of education: Not on file   Highest education level: Not on file  Occupational History   Not on file  Tobacco Use   Smoking status: Never   Smokeless tobacco: Never  Vaping Use   Vaping Use: Never used  Substance and Sexual Activity   Alcohol use: No   Drug use: No   Sexual activity: Not on file  Other Topics Concern   Not on file  Social History Narrative   Not on file   Social Determinants of Health   Financial Resource Strain: Not on file  Food Insecurity: Not on file  Transportation Needs: Not on file  Physical Activity:  Not on file  Stress: Not on file  Social Connections: Not on file  Intimate Partner Violence: Not on file   Family History  Problem Relation Age of Onset   Heart disease Mother        76s      VITAL SIGNS BP 107/60   Pulse 66   Temp 97.8 F (36.6 C)   Resp 20   Ht '5\' 2"'$  (1.575 m)   Wt 128 lb (58.1 kg)   SpO2 95%   BMI 23.41 kg/m   Outpatient Encounter Medications as of 08/21/2022  Medication Sig   Balsam [place]-Castor Oil OINT Apply topically. Apply to left buttock excoriation qshift & prn until resolved. Every Shift Day, Evening, Night  Apply to sacrum, coccyx and bilateral buttocks qshift for prevention. Every Shift Day, Evening, Night   fluticasone (FLONASE ALLERGY RELIEF) 50 MCG/ACT nasal spray Place 1 spray into both nostrils 2 (two) times daily as needed.   NON FORMULARY Diet: Regular, finger foods as desired   rivaroxaban (XARELTO) 20 MG TABS tablet Take 1 tablet (20 mg total) by mouth daily with supper.   talc (ZEASORB) powder under right side of neck, Every Shif   [DISCONTINUED] ZINC OXIDE EX Apply topically. bilateral buttocks & sacrum q shift & prn for prevention.   No  facility-administered encounter medications on file as of 08/21/2022.     SIGNIFICANT DIAGNOSTIC EXAMS   LABS REVIEWED PREVIOUS    09-25-21; wbc 4.2; hgb 12.1; hct 37.9; mcv 89.4 plt 155; glucose 63; bun 31; creat 0.59; k+ 3.8; na++ 142; ca 8.7; GFR >60; protein 5.6; albumin 3.0  05-04-22 wbc 6.5 hgb 12.6; hct 37.9; mcv 88.1 plt 233; glucose 82; bun 24; creat 0.58; k+ 4.0; na++ 141; ca 8.5; gfr >60; protein 5.7; albumin 3.0   NO NEW LABS.    Review of Systems  Unable to perform ROS: Dementia   Physical Exam Constitutional:      General: She is not in acute distress.    Appearance: She is well-developed. She is not diaphoretic.  Neck:     Thyroid: No thyromegaly.  Cardiovascular:     Rate and Rhythm: Normal rate and regular rhythm.     Pulses: Normal pulses.     Heart sounds: Normal heart  sounds.  Pulmonary:     Effort: Pulmonary effort is normal. No respiratory distress.     Breath sounds: Normal breath sounds.  Abdominal:     General: Bowel sounds are normal. There is no distension.     Palpations: Abdomen is soft.     Tenderness: There is no abdominal tenderness.  Musculoskeletal:     Cervical back: Neck supple.     Right lower leg: No edema.     Left lower leg: No edema.     Comments:  Neck is weak has contracture Is able to move all extremities right extremity with ataxic movements  Trace bilateral lower extremity edema   Lymphadenopathy:     Cervical: No cervical adenopathy.  Skin:    General: Skin is warm and dry.  Neurological:     Mental Status: She is alert. Mental status is at baseline.  Psychiatric:        Mood and Affect: Mood normal.       ASSESSMENT/ PLAN:  TODAY;   Parkinson's disease: no significant change: has declined further workup  2. Chronic diastolic CHF (congestive heart failure) is compensate  3. Venous (peripheral) insufficiency: is without change    PREVIOUS   4. Protein calorie malnutrition albumin 3.0 weight is 128 pounds weight is stable   5. Psychosis in elderly with behavioral disturbance: will not take medications; no recent reports of behavioral issues.   6. Dementia with psychosis: weight is 128 pounds  7. Failure to thrive in adult  8. Chronic pulmonary embolism without acute cor pulmonale unspecified pulmonary embolism type: is on long term xarelto 20 mg daily she does decline this medications on a regular basis.    Ok Edwards NP [facility]   call [phone]

## 2022-09-04 ENCOUNTER — Encounter: Payer: Self-pay | Admitting: Adult Health

## 2022-09-04 ENCOUNTER — Non-Acute Institutional Stay (SKILLED_NURSING_FACILITY): Payer: Medicare Other | Admitting: Adult Health

## 2022-09-04 DIAGNOSIS — G20B1 Parkinson's disease with dyskinesia, without mention of fluctuations: Secondary | ICD-10-CM | POA: Diagnosis not present

## 2022-09-04 DIAGNOSIS — F0392 Unspecified dementia, unspecified severity, with psychotic disturbance: Secondary | ICD-10-CM

## 2022-09-04 DIAGNOSIS — I5032 Chronic diastolic (congestive) heart failure: Secondary | ICD-10-CM | POA: Diagnosis not present

## 2022-09-04 NOTE — Progress Notes (Signed)
Location:  [place] Room Number: 31 Place of Service:  SNF (31)   CODE STATUS: dnr   Allergies  Allergen Reactions   Contrast Media [Iodinated Contrast Media] Anaphylaxis and Shortness Of Breath   Vancomycin Shortness Of Breath   Vioxx [Rofecoxib] Other (See Comments)    REACTION: irregular heartbeat    Chief Complaint  Patient presents with   Acute Visit    Care plan meeting    HPI:  We have come together for her care plan meeting.  BIMS 5/15 mood 2/30: nervous at times. She is bed ridden without falls. She is dependent for all her adl care. She is incontinent of bladder and bowel. Dietary: regular with finger foods. Weight is 126.4 pounds with 142.6 pounds 6 months ago. Appetite: . Requires assist with meals. Therapy: none at this time. Activities: 1:1 care when she allows. 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  Social History   Socioeconomic History   Marital status: Single    Spouse name: Not on file   Number of children: Not on file   Years of education: Not on file   Highest education level: Not on file  Occupational History   Not on file  Tobacco Use   Smoking status: Never   Smokeless tobacco: Never  Vaping Use   Vaping Use: Never used  Substance and Sexual Activity   Alcohol use: No   Drug use: No   Sexual activity: Not on file  Other Topics Concern   Not on file  Social History Narrative   Not on file   Social Determinants of Health   Financial Resource Strain: Not on file  Food  Insecurity: Not on file  Transportation Needs: Not on file  Physical Activity: Not on file  Stress: Not on file  Social Connections: Not on file  Intimate Partner Violence: Not on file   Family History  Problem Relation Age of Onset   Heart disease Mother        20s      VITAL SIGNS BP 120/64   Pulse 60   Temp 97.6 F (36.4 C)   Resp 19   Ht 5\' 2"  (1.575 m)   Wt 124 lb 6.4 oz (56.4 kg)   SpO2 97%   BMI 22.75 kg/m   Outpatient Encounter Medications as of 09/04/2022  Medication Sig   Balsam [place]-Castor Oil OINT Apply topically. Apply to left buttock excoriation qshift & prn until resolved. Every Shift Day, Evening, Night  Apply to sacrum, coccyx and bilateral buttocks qshift for prevention. Every Shift Day, Evening, Night   fluticasone (FLONASE ALLERGY RELIEF) 50 MCG/ACT nasal spray Place 1 spray into both nostrils 2 (two) times daily as needed.   NON FORMULARY Diet: Regular, finger foods as desired   rivaroxaban (XARELTO) 20 MG TABS tablet Take 1 tablet (20 mg total) by mouth daily with supper.   talc (ZEASORB) powder under right side of neck, Every Shif   No facility-administered encounter medications on file as of 09/04/2022.  SIGNIFICANT DIAGNOSTIC EXAMS  LABS REVIEWED PREVIOUS    09-25-21; wbc 4.2; hgb 12.1; hct 37.9; mcv 89.4 plt 155; glucose 63; bun 31; creat 0.59; k+ 3.8; na++ 142; ca 8.7; GFR >60; protein 5.6; albumin 3.0  05-04-22 wbc 6.5 hgb 12.6; hct 37.9; mcv 88.1 plt 233; glucose 82; bun 24; creat 0.58; k+ 4.0; na++ 141; ca 8.5; gfr >60; protein 5.7; albumin 3.0   NO NEW LABS.    Review of Systems  Unable to perform ROS: Dementia   Physical Exam Constitutional:      General: She is not in acute distress.    Appearance: She is well-developed. She is not diaphoretic.  Neck:     Thyroid: No thyromegaly.  Cardiovascular:     Rate and Rhythm: Normal rate and regular rhythm.     Heart sounds: Normal heart sounds.  Pulmonary:     Effort: Pulmonary  effort is normal. No respiratory distress.     Breath sounds: Normal breath sounds.  Abdominal:     General: Bowel sounds are normal. There is no distension.     Palpations: Abdomen is soft.     Tenderness: There is no abdominal tenderness.  Musculoskeletal:     Cervical back: Neck supple.     Right lower leg: No edema.     Left lower leg: No edema.     Comments:  Neck is weak has contracture Is able to move all extremities right extremity with ataxic movements  Trace bilateral lower extremity edema   Lymphadenopathy:     Cervical: No cervical adenopathy.  Skin:    General: Skin is warm and dry.  Neurological:     Mental Status: She is alert. Mental status is at baseline.  Psychiatric:        Mood and Affect: Mood normal.        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    Ok Edwards NP [facility]  call [phone]

## 2022-09-22 ENCOUNTER — Non-Acute Institutional Stay (SKILLED_NURSING_FACILITY): Payer: Medicare Other | Admitting: Internal Medicine

## 2022-09-22 ENCOUNTER — Encounter: Payer: Self-pay | Admitting: Internal Medicine

## 2022-09-22 DIAGNOSIS — R627 Adult failure to thrive: Secondary | ICD-10-CM | POA: Diagnosis not present

## 2022-09-22 DIAGNOSIS — E43 Unspecified severe protein-calorie malnutrition: Secondary | ICD-10-CM | POA: Diagnosis not present

## 2022-09-22 DIAGNOSIS — I5032 Chronic diastolic (congestive) heart failure: Secondary | ICD-10-CM | POA: Diagnosis not present

## 2022-09-22 DIAGNOSIS — F03918 Unspecified dementia, unspecified severity, with other behavioral disturbance: Secondary | ICD-10-CM | POA: Diagnosis not present

## 2022-09-22 NOTE — Assessment & Plan Note (Signed)
Staff reports that she frequently refuses her Xarelto despite the history of PTE.  She is expressing no desire for neurologic follow-up in reference to her end-stage Parkinson's.

## 2022-09-22 NOTE — Assessment & Plan Note (Signed)
Albumin and total protein were most recently checked 05/04/2022 and revealed values of 3.0 and 5.7.

## 2022-09-22 NOTE — Assessment & Plan Note (Addendum)
CHF clinically compensated; no NVD or peripheral edema. She is not on a CHF protocol at present.

## 2022-09-22 NOTE — Progress Notes (Signed)
   NURSING HOME LOCATION:  [facility] ROOM NUMBER:  102  CODE STATUS:  DNR  PCP:  Ok Edwards NP  This is a nursing facility follow up visit of chronic medical diagnoses & to document compliance with Regulation 483.30 (c) in [employer] Phase 2 which mandates caregiver visit ( visits can alternate among physician, PA or NP as per statutes) within 10 days of 30 days / 60 days/ 90 days post admission to SNF date    Interim medical record and care since last SNF visit was updated with review of diagnostic studies and change in clinical status since last visit were documented.  HPI: She is a permanent resident of this facility with medical diagnoses of chronic diastolic congestive heart failure, history of PTE, end-stage Parkinson's disease, protein/caloric malnutrition, history of vitamin B12 deficiency, adult failure to thrive, and psychosis with behavioral disturbance.  Most recent labs were completed 06/25/2022 and revealed a normal CBC.  Chemistries were most recently performed 05/04/2022 and revealed mild hypocalcemia with a value of 8.5, albumin of 3.0, and total protein of 5.7.  Review of systems could not be completed as she slept throughout the exam. Staff reports that she often refuses her Xarelto.  Physical exam:  Pertinent or positive findings: As noted she slept throughout the exam.  She exhibited hypopnea without frank apnea or snoring.  There is torticollis to the right with the right face essentially positioned against the right shoulder.  There is malar erythema visible on the left.  Heart sounds are markedly distant and basically undiscernible.  Breath sounds are also decreased.  The abdomen is protuberant and the height of the thorax is markedly decreased.  Pedal pulses are not palpable.  There is no peripheral edema.  There is nonblanchable erythematous dermatitis over the shins which mimics that seen in the malar area.  Interosseous  wasting is present.  General appearance: no acute distress, increased work of breathing is present.   Lymphatic: No lymphadenopathy about the head, neck, axilla. Ears:  External ear exam shows no significant lesions or deformities.   Nose:  External nasal examination shows no deformity or inflammation. Nasal mucosa are pink and moist without lesions, exudates Neck:  No thyromegaly, masses, tenderness noted.    Lungs:  without wheezes, rhonchi, rales, rubs. Abdomen: Bowel sounds are normal. Abdomen is soft and nontender with no organomegaly, hernias, masses. GU: Deferred  Extremities:  No cyanosis, clubbing  Neurologic exam :Balance, Rhomberg, finger to nose testing could not be completed due to clinical state Skin: Warm & dry w/o tenting.  See summary under each active problem in the Problem List with associated updated therapeutic plan

## 2022-09-22 NOTE — Patient Instructions (Signed)
See assessment and plan under each diagnosis in the problem list and acutely for this visit 

## 2022-09-22 NOTE — Assessment & Plan Note (Signed)
Failure to thrive syndrome is multifactorial in etiology including advanced age, end-stage Parkinson's, and protein/caloric malnutrition.  Nutritionist does continue to follow her here at the SNF. DNR status appropriate.

## 2022-09-23 ENCOUNTER — Non-Acute Institutional Stay (SKILLED_NURSING_FACILITY): Payer: Medicare Other | Admitting: Adult Health

## 2022-09-23 ENCOUNTER — Encounter: Payer: Self-pay | Admitting: Adult Health

## 2022-09-23 DIAGNOSIS — F0392 Unspecified dementia, unspecified severity, with psychotic disturbance: Secondary | ICD-10-CM | POA: Diagnosis not present

## 2022-09-23 DIAGNOSIS — Z91199 Patient's noncompliance with other medical treatment and regimen due to unspecified reason: Secondary | ICD-10-CM | POA: Diagnosis not present

## 2022-09-23 DIAGNOSIS — R627 Adult failure to thrive: Secondary | ICD-10-CM

## 2022-09-23 DIAGNOSIS — R634 Abnormal weight loss: Secondary | ICD-10-CM

## 2022-09-23 NOTE — Progress Notes (Signed)
Location:  [facility] Room Number: 102-P Place of Service:  SNF (31)   CODE STATUS: DNR  Allergies  Allergen Reactions   Contrast Media [Iodinated Contrast Media] Anaphylaxis and Shortness Of Breath   Vancomycin Shortness Of Breath   Vioxx [Rofecoxib] Other (See Comments)    REACTION: irregular heartbeat    Chief Complaint  Patient presents with   Acute Visit    Weight loss.    HPI:  She is losing weight. Six months ago she weighted 137 pounds with her weight in April 123 pounds. This represents a 10% weight loss in the past 6 months. She will decline meals periodically. She requires assist with her meals. She does spend all of her time in bed per her choice.   Past Medical History:  Diagnosis Date   Chronic diastolic CHF (congestive heart failure) 08/16/2009   Qualifier: Diagnosis of  By: Excell Seltzerooper, MD, Vale HavenMichael David    History of pulmonary embolism    Parkinson's disease    Venous stasis     History reviewed. No pertinent surgical history.  Social History   Socioeconomic History   Marital status: Single    Spouse name: Not on file   Number of children: Not on file   Years of education: Not on file   Highest education level: Not on file  Occupational History   Not on file  Tobacco Use   Smoking status: Never   Smokeless tobacco: Never  Vaping Use   Vaping Use: Never used  Substance and Sexual Activity   Alcohol use: No   Drug use: No   Sexual activity: Not on file  Other Topics Concern   Not on file  Social History Narrative   Not on file   Social Determinants of Health   Financial Resource Strain: Not on file  Food Insecurity: Not on file  Transportation Needs: Not on file  Physical Activity: Not on file  Stress: Not on file  Social Connections: Not on file  Intimate Partner Violence: Not on file   Family History  Problem Relation Age of Onset   Heart disease Mother        6490s      VITAL SIGNS BP 127/65   Pulse 78    Temp 98.6 F (37 C)   Resp (!) 22   Ht 5\' 2"  (1.575 m)   Wt 123 lb (55.8 kg)   SpO2 98%   BMI 22.50 kg/m   Outpatient Encounter Medications as of 09/23/2022  Medication Sig   Balsam [place]-Castor Oil OINT Apply topically. Apply to left buttock excoriation qshift & prn until resolved. Every Shift Day, Evening, Night  Apply to sacrum, coccyx and bilateral buttocks qshift for prevention. Every Shift Day, Evening, Night   fluticasone (FLONASE ALLERGY RELIEF) 50 MCG/ACT nasal spray Place 1 spray into both nostrils 2 (two) times daily as needed.   NON FORMULARY Diet: Regular, finger foods as desired   rivaroxaban (XARELTO) 20 MG TABS tablet Take 1 tablet (20 mg total) by mouth daily with supper.   talc (ZEASORB) powder under right side of neck, Every Shif   No facility-administered encounter medications on file as of 09/23/2022.     SIGNIFICANT DIAGNOSTIC EXAMS  LABS REVIEWED PREVIOUS    09-25-21; wbc 4.2; hgb 12.1; hct 37.9; mcv 89.4 plt 155; glucose 63; bun 31; creat 0.59; k+ 3.8; na++ 142; ca 8.7; GFR >60; protein 5.6; albumin 3.0  05-04-22 wbc 6.5 hgb 12.6; hct 37.9; mcv 88.1  plt 233; glucose 82; bun 24; creat 0.58; k+ 4.0; na++ 141; ca 8.5; gfr >60; protein 5.7; albumin 3.0 06-25-22: wbc 5.2; hgb 12.3; hct 38.4; mcv 90.1 plt 196    NO NEW LABS  Review of Systems  Unable to perform ROS: Dementia    Physical Exam Constitutional:      General: She is not in acute distress.    Appearance: She is well-developed. She is not diaphoretic.  Neck:     Thyroid: No thyromegaly.  Cardiovascular:     Rate and Rhythm: Normal rate and regular rhythm.     Pulses: Normal pulses.     Heart sounds: Normal heart sounds.  Pulmonary:     Effort: Pulmonary effort is normal. No respiratory distress.     Breath sounds: Normal breath sounds.  Abdominal:     General: Bowel sounds are normal. There is no distension.     Palpations: Abdomen is soft.     Tenderness: There is no abdominal tenderness.   Musculoskeletal:     Cervical back: Neck supple.     Right lower leg: Edema present.     Left lower leg: Edema present.     Comments:  Neck is weak has contracture Is able to move all extremities right extremity with ataxic movements  Trace bilateral lower extremity edema    Lymphadenopathy:     Cervical: No cervical adenopathy.  Skin:    General: Skin is warm and dry.  Neurological:     Mental Status: She is alert. Mental status is at baseline.  Psychiatric:        Mood and Affect: Mood normal.       ASSESSMENT/ PLAN:  TODAY  Weight loss unintentional Noncompliance Failure to thrive in adult Dementia with psychosis  At this time will not make any changes Will continue to monitor her status.    Synthia Innocenteborah Angely Dietz NP [facility]   call [phone]

## 2022-09-29 DIAGNOSIS — Z91199 Patient's noncompliance with other medical treatment and regimen due to unspecified reason: Secondary | ICD-10-CM | POA: Insufficient documentation

## 2022-09-29 DIAGNOSIS — R634 Abnormal weight loss: Secondary | ICD-10-CM | POA: Insufficient documentation

## 2022-10-23 ENCOUNTER — Non-Acute Institutional Stay (SKILLED_NURSING_FACILITY): Payer: Medicare Other | Admitting: Adult Health

## 2022-10-23 ENCOUNTER — Encounter: Payer: Self-pay | Admitting: Adult Health

## 2022-10-23 DIAGNOSIS — E43 Unspecified severe protein-calorie malnutrition: Secondary | ICD-10-CM | POA: Diagnosis not present

## 2022-10-23 DIAGNOSIS — Z91199 Patient's noncompliance with other medical treatment and regimen due to unspecified reason: Secondary | ICD-10-CM | POA: Diagnosis not present

## 2022-10-23 DIAGNOSIS — F03918 Unspecified dementia, unspecified severity, with other behavioral disturbance: Secondary | ICD-10-CM

## 2022-10-23 DIAGNOSIS — I509 Heart failure, unspecified: Secondary | ICD-10-CM | POA: Insufficient documentation

## 2022-10-23 NOTE — Progress Notes (Unsigned)
  Location:  [facility] Room Number: 102P Place of Service:  SNF (31)   CODE STATUS: DNR Palliative Care  Allergies  Allergen Reactions   Contrast Media [Iodinated Contrast Media] Anaphylaxis and Shortness Of Breath   Vancomycin Shortness Of Breath   Vioxx [Rofecoxib] Other (See Comments)    REACTION: irregular heartbeat    Chief Complaint  Patient presents with   Medical Management of Chronic Issues    Patient is being seen for Medical management of chronic issues    HPI:    Past Medical History:  Diagnosis Date   Chronic diastolic CHF (congestive heart failure) (HCC) 08/16/2009   Qualifier: Diagnosis of  By: Excell Seltzer, MD, [place]    History of pulmonary embolism    Parkinson's disease    Venous stasis     History reviewed. No pertinent surgical history.  Social History   Socioeconomic History   Marital status: Single    Spouse name: Not on file   Number of children: Not on file   Years of education: Not on file   Highest education level: Not on file  Occupational History   Not on file  Tobacco Use   Smoking status: Never   Smokeless tobacco: Never  Vaping Use   Vaping Use: Never used  Substance and Sexual Activity   Alcohol use: No   Drug use: No   Sexual activity: Not on file  Other Topics Concern   Not on file  Social History Narrative   Not on file   Social Determinants of Health   Financial Resource Strain: Not on file  Food Insecurity: Not on file  Transportation Needs: Not on file  Physical Activity: Not on file  Stress: Not on file  Social Connections: Not on file  Intimate Partner Violence: Not on file   Family History  Problem Relation Age of Onset   Heart disease Mother        34s      VITAL SIGNS BP 121/70   Pulse 68   Temp 98.6 F (37 C) (Temporal)   Resp (!) 22   Ht 5\' 2"  (1.575 m)   Wt 125 lb (56.7 kg)   SpO2 92%   BMI 22.86 kg/m   Outpatient Encounter Medications as of 10/23/2022   Medication Sig   Balsam [place]-Castor Oil OINT Apply topically. Apply to left buttock excoriation qshift & prn until resolved. Every Shift Day, Evening, Night  Apply to sacrum, coccyx and bilateral buttocks qshift for prevention. Every Shift Day, Evening, Night   fluticasone (FLONASE ALLERGY RELIEF) 50 MCG/ACT nasal spray Place 1 spray into both nostrils 2 (two) times daily as needed.   NON FORMULARY Diet: Regular, finger foods as desired   rivaroxaban (XARELTO) 20 MG TABS tablet Take 1 tablet (20 mg total) by mouth daily with supper.   talc (ZEASORB) powder under right side of neck, Every Shif   No facility-administered encounter medications on file as of 10/23/2022.     SIGNIFICANT DIAGNOSTIC EXAMS       ASSESSMENT/ PLAN:     Synthia Innocent NP [facility]  Contact [phone] Monday through Friday 8am- 5pm  After hours call [phone]

## 2022-11-23 ENCOUNTER — Non-Acute Institutional Stay (SKILLED_NURSING_FACILITY): Payer: Medicare Other | Admitting: Adult Health

## 2022-11-23 ENCOUNTER — Encounter: Payer: Self-pay | Admitting: Adult Health

## 2022-11-23 DIAGNOSIS — R627 Adult failure to thrive: Secondary | ICD-10-CM | POA: Diagnosis not present

## 2022-11-23 DIAGNOSIS — Z86711 Personal history of pulmonary embolism: Secondary | ICD-10-CM | POA: Diagnosis not present

## 2022-11-23 DIAGNOSIS — G20B1 Parkinson's disease with dyskinesia, without mention of fluctuations: Secondary | ICD-10-CM | POA: Diagnosis not present

## 2022-11-23 DIAGNOSIS — F0392 Unspecified dementia, unspecified severity, with psychotic disturbance: Secondary | ICD-10-CM | POA: Diagnosis not present

## 2022-11-23 NOTE — Progress Notes (Signed)
Location:  [facility] Room Number: 102 Place of Service:  SNF (31)   CODE STATUS: dnr   Allergies  Allergen Reactions   Contrast Media [Iodinated Contrast Media] Anaphylaxis and Shortness Of Breath   Vancomycin Shortness Of Breath   Vioxx [Rofecoxib] Other (See Comments)    REACTION: irregular heartbeat    Chief Complaint  Patient presents with   Medical Management of Chronic Issues                  Dementia with psychosis: /failure to thrive in adult: History of pulmonary embolism without acute cor pulmonale, unspecified pulmonary embolism type:  Parkinson's disease:     HPI:  She is a long term resident of this facility being seen for the management of her chronic illnesses: Dementia with psychosis: /failure to thrive in adult: History of pulmonary embolism without acute cor pulmonale, unspecified pulmonary embolism type:  Parkinson's disease. There are no reports of uncontrolled pain. Her weight for June has not yet been done.   Past Medical History:  Diagnosis Date   Chronic diastolic CHF (congestive heart failure) (HCC) 08/16/2009   Qualifier: Diagnosis of  By: Excell Seltzer, MD, [place]    History of pulmonary embolism    Parkinson's disease    Venous stasis     No past surgical history on file.  Social History   Socioeconomic History   Marital status: Single    Spouse name: Not on file   Number of children: Not on file   Years of education: Not on file   Highest education level: Not on file  Occupational History   Not on file  Tobacco Use   Smoking status: Never   Smokeless tobacco: Never  Vaping Use   Vaping Use: Never used  Substance and Sexual Activity   Alcohol use: No   Drug use: No   Sexual activity: Not on file  Other Topics Concern   Not on file  Social History Narrative   Not on file   Social Determinants of Health   Financial Resource Strain: Not on file  Food Insecurity: Not on file  Transportation Needs: Not  on file  Physical Activity: Not on file  Stress: Not on file  Social Connections: Not on file  Intimate Partner Violence: Not on file   Family History  Problem Relation Age of Onset   Heart disease Mother        50s      VITAL SIGNS BP (!) 140/78   Pulse 87   Temp (!) 97.2 F (36.2 C)   Resp (!) 23   Ht 5\' 2"  (1.575 m)   Wt 125 lb (56.7 kg)   SpO2 94%   BMI 22.86 kg/m   Outpatient Encounter Medications as of 11/23/2022  Medication Sig   Balsam [place]-Castor Oil OINT Apply topically. Apply to left buttock excoriation qshift & prn until resolved. Every Shift Day, Evening, Night  Apply to sacrum, coccyx and bilateral buttocks qshift for prevention. Every Shift Day, Evening, Night   fluticasone (FLONASE ALLERGY RELIEF) 50 MCG/ACT nasal spray Place 1 spray into both nostrils 2 (two) times daily as needed.   NON FORMULARY Diet: Regular, finger foods as desired   rivaroxaban (XARELTO) 20 MG TABS tablet Take 1 tablet (20 mg total) by mouth daily with supper.   talc (ZEASORB) powder under right side of neck, Every Shif   No facility-administered encounter medications on file as of 11/23/2022.  SIGNIFICANT DIAGNOSTIC EXAMS   LABS REVIEWED PREVIOUS she declines labs    05-04-22 wbc 6.5 hgb 12.6; hct 37.9; mcv 88.1 plt 233; glucose 82; bun 24; creat 0.58; k+ 4.0; na++ 141; ca 8.5; gfr >60; protein 5.7; albumin 3.0   NO NEW LABS.    Review of Systems  Reason unable to perform ROS: dementia.    Physical Exam Constitutional:      General: She is not in acute distress.    Appearance: She is well-developed. She is not diaphoretic.  Neck:     Thyroid: No thyromegaly.  Cardiovascular:     Rate and Rhythm: Normal rate and regular rhythm.     Pulses: Normal pulses.     Heart sounds: Normal heart sounds.  Pulmonary:     Effort: Pulmonary effort is normal. No respiratory distress.     Breath sounds: Normal breath sounds.  Abdominal:     General: Bowel sounds are normal. There  is no distension.     Palpations: Abdomen is soft.     Tenderness: There is no abdominal tenderness.  Musculoskeletal:     Cervical back: Neck supple.     Right lower leg: Edema present.     Left lower leg: Edema present.     Comments: Neck is weak has contracture Has contractures present  right extremity with ataxic movements  Trace bilateral lower extremity edema   Lymphadenopathy:     Cervical: No cervical adenopathy.  Skin:    General: Skin is warm and dry.  Neurological:     Mental Status: She is alert. Mental status is at baseline.  Psychiatric:        Mood and Affect: Mood normal.      ASSESSMENT/ PLAN:  TODAY;   Dementia with psychosis: /failure to thrive in adult: weight is 125 pounds; without significant change.   2. History of pulmonary embolism without acute cor pulmonale, unspecified pulmonary embolism type: declines nearly all doses of xarelto 20 mg daily;   3. Parkinson's disease: no significant change; does not take any medications; declines any further workup.    PREVIOUS   4. Chronic diastolic CHF (congestive heart failure) is compensate  5. Venous (peripheral) insufficiency: is without change   6. Noncompliance: she declines all lab work; and declines her medications; nearly all the time. She declines health maintenance needs as well.   7. Protein calorie malnutrition: albumin 3.0 weight is 125 pounds; is without significant change  8. Psychosis in elderly with behavioral disturbance: does not take medications; no recent reports of behavioral issues.     Synthia Innocent NP [facility]  call [phone]

## 2022-12-04 ENCOUNTER — Non-Acute Institutional Stay (SKILLED_NURSING_FACILITY): Payer: Medicare Other | Admitting: Adult Health

## 2022-12-04 ENCOUNTER — Encounter: Payer: Self-pay | Admitting: Adult Health

## 2022-12-04 DIAGNOSIS — I5032 Chronic diastolic (congestive) heart failure: Secondary | ICD-10-CM

## 2022-12-04 DIAGNOSIS — F0392 Unspecified dementia, unspecified severity, with psychotic disturbance: Secondary | ICD-10-CM | POA: Diagnosis not present

## 2022-12-04 DIAGNOSIS — E43 Unspecified severe protein-calorie malnutrition: Secondary | ICD-10-CM | POA: Diagnosis not present

## 2022-12-04 NOTE — Progress Notes (Signed)
Location:  [facility] Room Number: 102 Place of Service:  SNF (31)   CODE STATUS: dnr  Allergies  Allergen Reactions   Contrast Media [Iodinated Contrast Media] Anaphylaxis and Shortness Of Breath   Vancomycin Shortness Of Breath   Vioxx [Rofecoxib] Other (See Comments)    REACTION: irregular heartbeat    Chief Complaint  Patient presents with   Acute Visit    Care plan meeting     HPI:  We have come together for her care plan meeting  BIMS no; mood 0/30. She is bed bound without falls. She requires dependent assist with her adls. She is incontinent of bladder and bowel. Dietary: dependent assist with meals; regular diet with finger foods; appetite poor appetite  weight is 124.8 pounds down 8 pounds in the past 6 months; does not take ensure. Therapy: not at this time. Activities: spends all of her time in room.  She continues to be followed for her chronic illnesses: Chronic diastolic congestive heart failure   Dementia with psychosis   Severe protein calorie malnutrition  Past Medical History:  Diagnosis Date   Chronic diastolic CHF (congestive heart failure) (HCC) 08/16/2009   Qualifier: Diagnosis of  By: Excell Seltzer, MD, [place]    History of pulmonary embolism    Parkinson's disease    Venous stasis     No past surgical history on file.  Social History   Socioeconomic History   Marital status: Single    Spouse name: Not on file   Number of children: Not on file   Years of education: Not on file   Highest education level: Not on file  Occupational History   Not on file  Tobacco Use   Smoking status: Never   Smokeless tobacco: Never  Vaping Use   Vaping Use: Never used  Substance and Sexual Activity   Alcohol use: No   Drug use: No   Sexual activity: Not on file  Other Topics Concern   Not on file  Social History Narrative   Not on file   Social Determinants of Health   Financial Resource Strain: Not on file  Food  Insecurity: Not on file  Transportation Needs: Not on file  Physical Activity: Not on file  Stress: Not on file  Social Connections: Not on file  Intimate Partner Violence: Not on file   Family History  Problem Relation Age of Onset   Heart disease Mother        33s      VITAL SIGNS BP (!) 146/82   Pulse 62   Temp 97.9 F (36.6 C)   Resp (!) 22   Ht 5\' 2"  (1.575 m)   Wt 124 lb 12.8 oz (56.6 kg)   SpO2 97%   BMI 22.83 kg/m   Outpatient Encounter Medications as of 12/04/2022  Medication Sig   Balsam [place]-Castor Oil OINT Apply topically. Apply to left buttock excoriation qshift & prn until resolved. Every Shift Day, Evening, Night  Apply to sacrum, coccyx and bilateral buttocks qshift for prevention. Every Shift Day, Evening, Night   fluticasone (FLONASE ALLERGY RELIEF) 50 MCG/ACT nasal spray Place 1 spray into both nostrils 2 (two) times daily as needed.   NON FORMULARY Diet: Regular, finger foods as desired   rivaroxaban (XARELTO) 20 MG TABS tablet Take 1 tablet (20 mg total) by mouth daily with supper.   talc (ZEASORB) powder under right side of neck, Every Shif   No facility-administered encounter medications on  file as of 12/04/2022.     SIGNIFICANT DIAGNOSTIC EXAMS  LABS REVIEWED PREVIOUS she declines labs    05-04-22 wbc 6.5 hgb 12.6; hct 37.9; mcv 88.1 plt 233; glucose 82; bun 24; creat 0.58; k+ 4.0; na++ 141; ca 8.5; gfr >60; protein 5.7; albumin 3.0   NO NEW LABS.    Review of Systems  Unable to perform ROS: Dementia    Physical Exam Constitutional:      General: She is not in acute distress.    Appearance: She is well-developed. She is not diaphoretic.  Neck:     Thyroid: No thyromegaly.  Cardiovascular:     Rate and Rhythm: Normal rate and regular rhythm.     Pulses: Normal pulses.     Heart sounds: Normal heart sounds.  Pulmonary:     Effort: Pulmonary effort is normal. No respiratory distress.     Breath sounds: Normal breath sounds.   Abdominal:     General: Bowel sounds are normal. There is no distension.     Palpations: Abdomen is soft.     Tenderness: There is no abdominal tenderness.  Musculoskeletal:     Cervical back: Neck supple.     Right lower leg: Edema present.     Left lower leg: Edema present.     Comments: Neck is weak has contracture Has contractures present  right extremity with ataxic movements  Trace bilateral lower extremity edema    Lymphadenopathy:     Cervical: No cervical adenopathy.  Skin:    General: Skin is warm and dry.  Neurological:     Mental Status: She is alert. Mental status is at baseline.  Psychiatric:        Mood and Affect: Mood normal.       ASSESSMENT/ PLAN:  TODAY  Chronic diastolic congestive heart failure Dementia with psychosis Severe protein calorie malnutrition  Will continue current medications Will continue current plan of care Will continue to monitor her status.   Time spent with patient 40 minutes: medications; plan of care dietary   Synthia Innocent NP [facility]   call [phone]

## 2022-12-18 ENCOUNTER — Encounter: Payer: Self-pay | Admitting: Internal Medicine

## 2022-12-18 ENCOUNTER — Non-Acute Institutional Stay (SKILLED_NURSING_FACILITY): Payer: Medicare Other | Admitting: Internal Medicine

## 2022-12-18 DIAGNOSIS — Z86711 Personal history of pulmonary embolism: Secondary | ICD-10-CM

## 2022-12-18 DIAGNOSIS — G20B1 Parkinson's disease with dyskinesia, without mention of fluctuations: Secondary | ICD-10-CM

## 2022-12-18 DIAGNOSIS — R627 Adult failure to thrive: Secondary | ICD-10-CM | POA: Diagnosis not present

## 2022-12-18 DIAGNOSIS — I5032 Chronic diastolic (congestive) heart failure: Secondary | ICD-10-CM | POA: Diagnosis not present

## 2022-12-18 NOTE — Assessment & Plan Note (Addendum)
Xarelto will be continued as she is essentially completely immobile with high risk of recurrent DVT and PTE.

## 2022-12-18 NOTE — Assessment & Plan Note (Signed)
CHF clinically compensated; no NVD or peripheral edema. No cardiac regimen indicated.  

## 2022-12-18 NOTE — Patient Instructions (Signed)
See assessment and plan under each diagnosis in the problem list and acutely for this visit 

## 2022-12-18 NOTE — Assessment & Plan Note (Signed)
She has profound neuromuscular complications of Parkinson's as she has declined neurologic follow-up and treatment.  Findings are clinically essentially stable.

## 2022-12-18 NOTE — Progress Notes (Signed)
NURSING HOME LOCATION:  [facility] ROOM NUMBER:  102P  CODE STATUS: DNR   PCP:  Synthia Innocent NP  This is a nursing facility follow up visit of chronic medical diagnoses & to document compliance with Regulation 483.30 (c) in The Long Term Care Survey Manual Phase 2 which mandates caregiver visit ( visits can alternate among physician, PA or NP as per statutes) within 10 days of 30 days / 60 days/ 90 days post admission to SNF date    Interim medical record and care since last SNF visit was updated with review of diagnostic studies and change in clinical status since last visit were documented.  HPI:She is a permanent resident of this facility with medical diagnoses of end-stage Parkinson's disease, protein caloric malnutrition, history of vitamin B12 deficiency, history of chronic diastolic congestive heart failure, history of PTE, and history of psychosis with behavioral disturbances.  No current labs because of DNR status.  Review of systems could not be completed. Nutritionist does report slight weight loss ,not felt to be clinically significant.   She was essentially nonverbal and profoundly hard of hearing.  The patient's only responses were "what did you say?" when I would ask a question.  The only other comment she made was a command "cover up my feet" after I had pulled the sheets back to check the lower extremities.  She continues to be noncompliant with recommendations for neurologic follow-up and therefore is on no antiparkinson agents.  She does remain on Xarelto because of the history of PTE and her bed bound, immobile status.  Physical exam:  Pertinent or positive findings: She appears almost cachectic.  Initially she was asleep without snoring, hypopnea, or frank apnea.  She did wake up but again she clinically seemed almost deaf.  She lay in bed with the bed at its lowest height, essentially on the floor.  Left malar hyperemia was present without tissue  breakdown.  Eyebrows are absent.  Her head was flexed to the right with the cheek on the right shoulder.  She exhibited a repetitive puckering motion of the lips.  Intraoral exam was markedly limited.  There was obvious decrease in the thoracic height with the chest essentially sitting on the hips.  Heart sounds could not be auscultated.  Breath sounds were decreased.  Abdomen is protuberant.  Pedal pulses are not palpable.  The upper extremities are flexed across the chest. There is markedly decreased passage range of motion of the upper extremities especially on the left.  There is hyperextension of the left foot.  Pedal pulses are not palpable.  She exhibits a fine tremor of the right foot.  There is bland hyperpigmentation of the shins.  General appearance: no acute distress, increased work of breathing is present.   Lymphatic: No lymphadenopathy about the head, neck, axilla. Eyes: No conjunctival inflammation or lid edema is present. There is no scleral icterus. Ears:  External ear exam shows no significant lesions or deformities.   Nose:  External nasal examination shows no deformity or inflammation. Nasal mucosa are pink and moist without lesions, exudates Neck:  No thyromegaly, masses, tenderness noted.    Lungs: without wheezes, rhonchi, rales, rubs. Abdomen: Bowel sounds are normal. Abdomen is soft and nontender with no organomegaly, hernias, masses. GU: Deferred  Extremities:  No cyanosis, clubbing, edema  Neurologic exam :Balance, Rhomberg, finger to nose testing could not be completed due to clinical state Skin: Warm & dry w/o tenting.  See summary under  each active problem in the Problem List with associated updated therapeutic plan

## 2022-12-18 NOTE — Assessment & Plan Note (Addendum)
Multifactorial including end-stage Parkinson's with psychosis & protein/caloric malnutrition.  She has marked limb atrophy and interosseous wasting.  Nutritionist continues to follow. DNR designation is clinically appropriate.

## 2023-01-22 ENCOUNTER — Encounter: Payer: Self-pay | Admitting: Adult Health

## 2023-01-22 ENCOUNTER — Non-Acute Institutional Stay (SKILLED_NURSING_FACILITY): Payer: Medicare Other | Admitting: Adult Health

## 2023-01-22 DIAGNOSIS — I5032 Chronic diastolic (congestive) heart failure: Secondary | ICD-10-CM | POA: Diagnosis not present

## 2023-01-22 DIAGNOSIS — I872 Venous insufficiency (chronic) (peripheral): Secondary | ICD-10-CM

## 2023-01-22 NOTE — Progress Notes (Signed)
Location:  [facility] Room Number: 102P Place of Service:  SNF (31)   CODE STATUS: DNR palliative care   Allergies  Allergen Reactions   Contrast Media [Iodinated Contrast Media] Anaphylaxis and Shortness Of Breath   Vancomycin Shortness Of Breath   Vioxx [Rofecoxib] Other (See Comments)    REACTION: irregular heartbeat    Chief Complaint  Patient presents with   Medical Management of Chronic Issues            Chronic diastolic CHF (congestive heart failure)  Venous (peripheral) insufficiency  Noncompliance     HPI:  She is a 88 year old long term resident of this facility being seen for the management of her chronic illnesses including: Chronic diastolic CHF (congestive heart failure)  Venous (peripheral) insufficiency  Noncompliance. There are no reports of pain present. She does spend all of her time in bed. There are no reports of agitation.   Past Medical History:  Diagnosis Date   Chronic diastolic CHF (congestive heart failure) (HCC) 08/16/2009   Qualifier: Diagnosis of  By: Excell Seltzer, MD, [place]    History of pulmonary embolism    Parkinson's disease    Venous stasis     History reviewed. No pertinent surgical history.  Social History   Socioeconomic History   Marital status: Single    Spouse name: Not on file   Number of children: Not on file   Years of education: Not on file   Highest education level: Not on file  Occupational History   Not on file  Tobacco Use   Smoking status: Never   Smokeless tobacco: Never  Vaping Use   Vaping status: Never Used  Substance and Sexual Activity   Alcohol use: No   Drug use: No   Sexual activity: Not on file  Other Topics Concern   Not on file  Social History Narrative   Not on file   Social Determinants of Health   Financial Resource Strain: Not on file  Food Insecurity: Not on file  Transportation Needs: Not on file  Physical Activity: Not on file  Stress: Not on file   Social Connections: Not on file  Intimate Partner Violence: Not on file   Family History  Problem Relation Age of Onset   Heart disease Mother        21s      VITAL SIGNS BP 137/68   Pulse 62   Temp (!) 97.1 F (36.2 C) (Temporal)   Resp 20   Ht 5\' 2"  (1.575 m)   Wt 114 lb 6.4 oz (51.9 kg)   SpO2 96%   BMI 20.92 kg/m   Outpatient Encounter Medications as of 01/22/2023  Medication Sig   Balsam [place]-Castor Oil OINT Apply topically. Apply to left buttock excoriation qshift & prn until resolved. Every Shift Day, Evening, Night  Apply to sacrum, coccyx and bilateral buttocks qshift for prevention. Every Shift Day, Evening, Night   NON FORMULARY Diet: Regular, finger foods as desired   rivaroxaban (XARELTO) 20 MG TABS tablet Take 1 tablet (20 mg total) by mouth daily with supper.   talc (ZEASORB) powder under right side of neck, Every Shif   fluticasone (FLONASE ALLERGY RELIEF) 50 MCG/ACT nasal spray Place 1 spray into both nostrils 2 (two) times daily as needed. (Patient not taking: Reported on 01/22/2023)   No facility-administered encounter medications on file as of 01/22/2023.     SIGNIFICANT DIAGNOSTIC EXAMS   LABS REVIEWED PREVIOUS she declines  labs    05-04-22 wbc 6.5 hgb 12.6; hct 37.9; mcv 88.1 plt 233; glucose 82; bun 24; creat 0.58; k+ 4.0; na++ 141; ca 8.5; gfr >60; protein 5.7; albumin 3.0   NO NEW LABS.    Review of Systems  Unable to perform ROS: Dementia     Physical Exam Constitutional:      General: She is not in acute distress.    Appearance: She is well-developed. She is not diaphoretic.  Neck:     Thyroid: No thyromegaly.  Cardiovascular:     Rate and Rhythm: Normal rate and regular rhythm.     Pulses: Normal pulses.     Heart sounds: Normal heart sounds.  Pulmonary:     Effort: Pulmonary effort is normal. No respiratory distress.     Breath sounds: Normal breath sounds.  Abdominal:     General: Bowel sounds are normal. There is no  distension.     Palpations: Abdomen is soft.     Tenderness: There is no abdominal tenderness.  Musculoskeletal:     Cervical back: Neck supple.     Right lower leg: No edema.     Left lower leg: No edema.     Comments: Neck is weak has contracture Has contractures present  right extremity with ataxic movements  Trace bilateral lower extremity edema  Lymphadenopathy:     Cervical: No cervical adenopathy.  Skin:    General: Skin is warm and dry.  Neurological:     Mental Status: She is alert. Mental status is at baseline.  Psychiatric:        Mood and Affect: Mood normal.      ASSESSMENT/ PLAN:  TODAY;   Chronic diastolic CHF (congestive heart failure) is compensated  2. Venous (peripheral) insufficiency without change  3. Noncompliance: she declines lab work; will decline meals and will decline medications; she declines health maintenance needs as well.     PREVIOUS   4. Protein calorie malnutrition: albumin 3.0 weight is 114 pounds; is without significant change  5. Psychosis in elderly with behavioral disturbance: does not take medications; no recent reports of behavioral issues.   6. Dementia with psychosis: /failure to thrive in adult: weight is 114 pounds; without significant change.   7. History of pulmonary embolism without acute cor pulmonale, unspecified pulmonary embolism type: declines nearly all doses of xarelto 20 mg daily;   8. Parkinson's disease: no significant change; does not take any medications; declines any further workup.        Synthia Innocent NP [facility] Adult Medicine  call [phone]

## 2023-02-10 ENCOUNTER — Non-Acute Institutional Stay (SKILLED_NURSING_FACILITY): Payer: Medicare Other | Admitting: Adult Health

## 2023-02-10 ENCOUNTER — Encounter: Payer: Self-pay | Admitting: Adult Health

## 2023-02-10 DIAGNOSIS — F0392 Unspecified dementia, unspecified severity, with psychotic disturbance: Secondary | ICD-10-CM | POA: Diagnosis not present

## 2023-02-10 DIAGNOSIS — R627 Adult failure to thrive: Secondary | ICD-10-CM

## 2023-02-10 NOTE — Progress Notes (Signed)
Location:  [facility] Room Number: 102 Place of Service:  SNF (31)   CODE STATUS: dnr   Allergies  Allergen Reactions   Contrast Media [Iodinated Contrast Media] Anaphylaxis and Shortness Of Breath   Vancomycin Shortness Of Breath   Vioxx [Rofecoxib] Other (See Comments)    REACTION: irregular heartbeat    Chief Complaint  Patient presents with   Acute Visit    Change in status     HPI:  She continues to decline in her status. She is taking in very little by mouth. She is losing weight. She is less responsive. The nursing staff discussed her poor prognosis with her family. They have verbalized understanding. They do not desire a feeding tube. They wish for comfort care.    Past Medical History:  Diagnosis Date   Chronic diastolic CHF (congestive heart failure) (HCC) 08/16/2009   Qualifier: Diagnosis of  By: Excell Seltzer, MD, [place]    History of pulmonary embolism    Parkinson's disease    Venous stasis     No past surgical history on file.  Social History   Socioeconomic History   Marital status: Single    Spouse name: Not on file   Number of children: Not on file   Years of education: Not on file   Highest education level: Not on file  Occupational History   Not on file  Tobacco Use   Smoking status: Never   Smokeless tobacco: Never  Vaping Use   Vaping status: Never Used  Substance and Sexual Activity   Alcohol use: No   Drug use: No   Sexual activity: Not on file  Other Topics Concern   Not on file  Social History Narrative   Not on file   Social Determinants of Health   Financial Resource Strain: Not on file  Food Insecurity: Not on file  Transportation Needs: Not on file  Physical Activity: Not on file  Stress: Not on file  Social Connections: Not on file  Intimate Partner Violence: Not on file   Family History  Problem Relation Age of Onset   Heart disease Mother        29s      VITAL SIGNS BP 101/65    Pulse 68   Temp 98.6 F (37 C)   Resp (!) 22   Ht 5\' 2"  (1.575 m)   Wt 117 lb 3.2 oz (53.2 kg)   SpO2 97%   BMI 21.44 kg/m   Outpatient Encounter Medications as of 02/10/2023  Medication Sig   Balsam [place]-Castor Oil OINT Apply topically. Apply to left buttock excoriation qshift & prn until resolved. Every Shift Day, Evening, Night  Apply to sacrum, coccyx and bilateral buttocks qshift for prevention. Every Shift Day, Evening, Night   NON FORMULARY Diet: Regular, finger foods as desired   rivaroxaban (XARELTO) 20 MG TABS tablet Take 1 tablet (20 mg total) by mouth daily with supper.   talc (ZEASORB) powder under right side of neck, Every Shif   [DISCONTINUED] fluticasone (FLONASE ALLERGY RELIEF) 50 MCG/ACT nasal spray Place 1 spray into both nostrils 2 (two) times daily as needed. (Patient not taking: Reported on 01/22/2023)   No facility-administered encounter medications on file as of 02/10/2023.     SIGNIFICANT DIAGNOSTIC EXAMS  LABS REVIEWED PREVIOUS she declines labs    05-04-22 wbc 6.5 hgb 12.6; hct 37.9; mcv 88.1 plt 233; glucose 82; bun 24; creat 0.58; k+ 4.0; na++ 141; ca 8.5; gfr >  60; protein 5.7; albumin 3.0   NO NEW LABS.    Review of Systems  Unable to perform ROS: Dementia   Physical Exam Constitutional:      General: She is not in acute distress.    Appearance: She is underweight. She is not diaphoretic.  Neck:     Thyroid: No thyromegaly.  Cardiovascular:     Rate and Rhythm: Normal rate and regular rhythm.     Pulses: Normal pulses.     Heart sounds: Normal heart sounds.  Pulmonary:     Effort: Pulmonary effort is normal. No respiratory distress.     Breath sounds: Normal breath sounds.  Abdominal:     General: Bowel sounds are normal. There is no distension.     Palpations: Abdomen is soft.     Tenderness: There is no abdominal tenderness.  Musculoskeletal:     Cervical back: Neck supple.     Right lower leg: No edema.     Left lower leg: No edema.      Comments:  Neck is weak has contracture Has contractures present  right extremity with ataxic movements  Trace bilateral lower extremity edema   Lymphadenopathy:     Cervical: No cervical adenopathy.  Skin:    General: Skin is warm and dry.  Neurological:     Mental Status: She is alert. Mental status is at baseline.  Psychiatric:        Mood and Affect: Mood normal.      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  Will monitor her status.    Synthia Innocent NP [facility]   call [phone]

## 2023-02-21 DEATH — deceased
